# Patient Record
Sex: Male | Born: 1935 | Race: White | Hispanic: No | Marital: Married | Smoking: Former smoker
Health system: Southern US, Community
[De-identification: ages and names within clinical notes are randomized; demographics above are authoritative.]

## PROBLEM LIST (undated history)

## (undated) DIAGNOSIS — J189 Pneumonia, unspecified organism: Secondary | ICD-10-CM

## (undated) DIAGNOSIS — Z7901 Long term (current) use of anticoagulants: Secondary | ICD-10-CM

## (undated) DIAGNOSIS — F419 Anxiety disorder, unspecified: Secondary | ICD-10-CM

## (undated) DIAGNOSIS — I639 Cerebral infarction, unspecified: Secondary | ICD-10-CM

## (undated) DIAGNOSIS — I714 Abdominal aortic aneurysm, without rupture, unspecified: Secondary | ICD-10-CM

## (undated) DIAGNOSIS — I219 Acute myocardial infarction, unspecified: Secondary | ICD-10-CM

## (undated) DIAGNOSIS — C61 Malignant neoplasm of prostate: Secondary | ICD-10-CM

## (undated) DIAGNOSIS — F32A Depression, unspecified: Secondary | ICD-10-CM

## (undated) DIAGNOSIS — M1990 Unspecified osteoarthritis, unspecified site: Secondary | ICD-10-CM

## (undated) DIAGNOSIS — K219 Gastro-esophageal reflux disease without esophagitis: Secondary | ICD-10-CM

## (undated) DIAGNOSIS — I4891 Unspecified atrial fibrillation: Secondary | ICD-10-CM

## (undated) DIAGNOSIS — R51 Headache: Secondary | ICD-10-CM

## (undated) DIAGNOSIS — G8929 Other chronic pain: Secondary | ICD-10-CM

## (undated) DIAGNOSIS — H9190 Unspecified hearing loss, unspecified ear: Secondary | ICD-10-CM

## (undated) DIAGNOSIS — I10 Essential (primary) hypertension: Secondary | ICD-10-CM

## (undated) DIAGNOSIS — Z436 Encounter for attention to other artificial openings of urinary tract: Secondary | ICD-10-CM

## (undated) DIAGNOSIS — I350 Nonrheumatic aortic (valve) stenosis: Secondary | ICD-10-CM

## (undated) DIAGNOSIS — J449 Chronic obstructive pulmonary disease, unspecified: Secondary | ICD-10-CM

## (undated) DIAGNOSIS — C4491 Basal cell carcinoma of skin, unspecified: Secondary | ICD-10-CM

## (undated) DIAGNOSIS — Z950 Presence of cardiac pacemaker: Secondary | ICD-10-CM

## (undated) DIAGNOSIS — R0602 Shortness of breath: Secondary | ICD-10-CM

## (undated) DIAGNOSIS — I951 Orthostatic hypotension: Secondary | ICD-10-CM

## (undated) DIAGNOSIS — R55 Syncope and collapse: Secondary | ICD-10-CM

## (undated) DIAGNOSIS — I2510 Atherosclerotic heart disease of native coronary artery without angina pectoris: Secondary | ICD-10-CM

## (undated) DIAGNOSIS — C679 Malignant neoplasm of bladder, unspecified: Secondary | ICD-10-CM

## (undated) DIAGNOSIS — R001 Bradycardia, unspecified: Secondary | ICD-10-CM

## (undated) DIAGNOSIS — M5416 Radiculopathy, lumbar region: Secondary | ICD-10-CM

## (undated) DIAGNOSIS — F329 Major depressive disorder, single episode, unspecified: Secondary | ICD-10-CM

## (undated) DIAGNOSIS — Z9889 Other specified postprocedural states: Secondary | ICD-10-CM

## (undated) DIAGNOSIS — E785 Hyperlipidemia, unspecified: Secondary | ICD-10-CM

## (undated) HISTORY — PX: SKIN CANCER EXCISION: SHX779

## (undated) HISTORY — DX: Abdominal aortic aneurysm, without rupture: I71.4

## (undated) HISTORY — PX: BLADDER SURGERY: SHX569

## (undated) HISTORY — DX: Orthostatic hypotension: I95.1

## (undated) HISTORY — PX: INSERT / REPLACE / REMOVE PACEMAKER: SUR710

## (undated) HISTORY — DX: Hyperlipidemia, unspecified: E78.5

## (undated) HISTORY — DX: Bradycardia, unspecified: R00.1

## (undated) HISTORY — PX: REVISION UROSTOMY CUTANEOUS: SUR1282

## (undated) HISTORY — DX: Chronic obstructive pulmonary disease, unspecified: J44.9

## (undated) HISTORY — DX: Long term (current) use of anticoagulants: Z79.01

## (undated) HISTORY — DX: Abdominal aortic aneurysm, without rupture, unspecified: I71.40

## (undated) HISTORY — DX: Atherosclerotic heart disease of native coronary artery without angina pectoris: I25.10

## (undated) HISTORY — DX: Acute myocardial infarction, unspecified: I21.9

## (undated) HISTORY — DX: Major depressive disorder, single episode, unspecified: F32.9

## (undated) HISTORY — PX: CATARACT EXTRACTION W/ INTRAOCULAR LENS  IMPLANT, BILATERAL: SHX1307

## (undated) HISTORY — DX: Depression, unspecified: F32.A

## (undated) HISTORY — PX: CHOLECYSTECTOMY: SHX55

## (undated) HISTORY — PX: APPENDECTOMY: SHX54

## (undated) HISTORY — DX: Nonrheumatic aortic (valve) stenosis: I35.0

## (undated) HISTORY — PX: PROSTATECTOMY: SHX69

## (undated) HISTORY — DX: Syncope and collapse: R55

---

## 1997-06-04 HISTORY — PX: ABDOMINAL AORTIC ANEURYSM REPAIR: SUR1152

## 1997-06-04 HISTORY — PX: CORONARY ARTERY BYPASS GRAFT: SHX141

## 2000-09-13 ENCOUNTER — Ambulatory Visit (HOSPITAL_COMMUNITY): Admission: RE | Admit: 2000-09-13 | Discharge: 2000-09-13 | Payer: Self-pay | Admitting: Gastroenterology

## 2000-12-04 ENCOUNTER — Ambulatory Visit (HOSPITAL_COMMUNITY): Admission: RE | Admit: 2000-12-04 | Discharge: 2000-12-04 | Payer: Self-pay | Admitting: Cardiology

## 2000-12-25 ENCOUNTER — Encounter: Payer: Self-pay | Admitting: Cardiothoracic Surgery

## 2000-12-27 ENCOUNTER — Inpatient Hospital Stay (HOSPITAL_COMMUNITY): Admission: RE | Admit: 2000-12-27 | Discharge: 2001-01-02 | Payer: Self-pay | Admitting: Cardiothoracic Surgery

## 2000-12-27 ENCOUNTER — Encounter: Payer: Self-pay | Admitting: Cardiothoracic Surgery

## 2000-12-28 ENCOUNTER — Encounter: Payer: Self-pay | Admitting: Cardiothoracic Surgery

## 2000-12-28 ENCOUNTER — Encounter: Payer: Self-pay | Admitting: Thoracic Surgery (Cardiothoracic Vascular Surgery)

## 2000-12-29 ENCOUNTER — Encounter: Payer: Self-pay | Admitting: Thoracic Surgery (Cardiothoracic Vascular Surgery)

## 2001-05-16 ENCOUNTER — Encounter: Admission: RE | Admit: 2001-05-16 | Discharge: 2001-05-16 | Payer: Self-pay | Admitting: Surgery

## 2001-05-16 ENCOUNTER — Encounter: Payer: Self-pay | Admitting: Surgery

## 2001-05-26 ENCOUNTER — Encounter: Admission: RE | Admit: 2001-05-26 | Discharge: 2001-05-26 | Payer: Self-pay | Admitting: Surgery

## 2001-05-26 ENCOUNTER — Encounter: Payer: Self-pay | Admitting: Surgery

## 2001-05-29 ENCOUNTER — Ambulatory Visit (HOSPITAL_BASED_OUTPATIENT_CLINIC_OR_DEPARTMENT_OTHER): Admission: RE | Admit: 2001-05-29 | Discharge: 2001-05-29 | Payer: Self-pay | Admitting: Surgery

## 2001-05-29 ENCOUNTER — Encounter (INDEPENDENT_AMBULATORY_CARE_PROVIDER_SITE_OTHER): Payer: Self-pay | Admitting: Specialist

## 2001-06-10 ENCOUNTER — Encounter: Payer: Self-pay | Admitting: Internal Medicine

## 2001-06-10 ENCOUNTER — Encounter: Admission: RE | Admit: 2001-06-10 | Discharge: 2001-06-10 | Payer: Self-pay | Admitting: Internal Medicine

## 2001-06-12 ENCOUNTER — Encounter: Admission: RE | Admit: 2001-06-12 | Discharge: 2001-09-10 | Payer: Self-pay

## 2001-09-30 ENCOUNTER — Encounter: Admission: RE | Admit: 2001-09-30 | Discharge: 2001-12-29 | Payer: Self-pay

## 2001-12-15 ENCOUNTER — Encounter (INDEPENDENT_AMBULATORY_CARE_PROVIDER_SITE_OTHER): Payer: Self-pay | Admitting: Specialist

## 2001-12-15 ENCOUNTER — Ambulatory Visit (HOSPITAL_BASED_OUTPATIENT_CLINIC_OR_DEPARTMENT_OTHER): Admission: RE | Admit: 2001-12-15 | Discharge: 2001-12-15 | Payer: Self-pay | Admitting: Surgery

## 2002-01-14 ENCOUNTER — Encounter: Payer: Self-pay | Admitting: Internal Medicine

## 2002-01-14 ENCOUNTER — Encounter: Admission: RE | Admit: 2002-01-14 | Discharge: 2002-01-14 | Payer: Self-pay | Admitting: Internal Medicine

## 2002-12-22 ENCOUNTER — Encounter: Admission: RE | Admit: 2002-12-22 | Discharge: 2002-12-22 | Payer: Self-pay | Admitting: Internal Medicine

## 2002-12-22 ENCOUNTER — Encounter: Payer: Self-pay | Admitting: Internal Medicine

## 2003-07-21 ENCOUNTER — Ambulatory Visit (HOSPITAL_COMMUNITY): Admission: RE | Admit: 2003-07-21 | Discharge: 2003-07-21 | Payer: Self-pay | Admitting: Urology

## 2003-07-21 ENCOUNTER — Encounter (INDEPENDENT_AMBULATORY_CARE_PROVIDER_SITE_OTHER): Payer: Self-pay | Admitting: *Deleted

## 2003-07-21 ENCOUNTER — Encounter (INDEPENDENT_AMBULATORY_CARE_PROVIDER_SITE_OTHER): Payer: Self-pay | Admitting: Specialist

## 2004-02-27 ENCOUNTER — Ambulatory Visit (HOSPITAL_COMMUNITY): Admission: RE | Admit: 2004-02-27 | Discharge: 2004-02-27 | Payer: Self-pay | Admitting: Internal Medicine

## 2004-06-04 HISTORY — PX: REVISION UROSTOMY CUTANEOUS: SUR1282

## 2005-01-30 ENCOUNTER — Encounter: Admission: RE | Admit: 2005-01-30 | Discharge: 2005-01-30 | Payer: Self-pay | Admitting: Urology

## 2005-02-07 ENCOUNTER — Encounter (INDEPENDENT_AMBULATORY_CARE_PROVIDER_SITE_OTHER): Payer: Self-pay | Admitting: Specialist

## 2005-02-07 ENCOUNTER — Ambulatory Visit (HOSPITAL_BASED_OUTPATIENT_CLINIC_OR_DEPARTMENT_OTHER): Admission: RE | Admit: 2005-02-07 | Discharge: 2005-02-07 | Payer: Self-pay | Admitting: Urology

## 2005-02-07 ENCOUNTER — Ambulatory Visit (HOSPITAL_COMMUNITY): Admission: RE | Admit: 2005-02-07 | Discharge: 2005-02-07 | Payer: Self-pay | Admitting: Urology

## 2005-03-05 ENCOUNTER — Encounter (INDEPENDENT_AMBULATORY_CARE_PROVIDER_SITE_OTHER): Payer: Self-pay | Admitting: Specialist

## 2005-03-05 ENCOUNTER — Ambulatory Visit (HOSPITAL_COMMUNITY): Admission: RE | Admit: 2005-03-05 | Discharge: 2005-03-06 | Payer: Self-pay | Admitting: Urology

## 2005-04-25 ENCOUNTER — Inpatient Hospital Stay (HOSPITAL_COMMUNITY): Admission: RE | Admit: 2005-04-25 | Discharge: 2005-05-02 | Payer: Self-pay | Admitting: Urology

## 2005-04-25 ENCOUNTER — Encounter (INDEPENDENT_AMBULATORY_CARE_PROVIDER_SITE_OTHER): Payer: Self-pay | Admitting: *Deleted

## 2005-05-23 ENCOUNTER — Encounter: Admission: RE | Admit: 2005-05-23 | Discharge: 2005-05-23 | Payer: Self-pay | Admitting: Gastroenterology

## 2005-06-19 ENCOUNTER — Encounter: Admission: RE | Admit: 2005-06-19 | Discharge: 2005-06-19 | Payer: Self-pay | Admitting: General Surgery

## 2005-10-26 ENCOUNTER — Ambulatory Visit (HOSPITAL_COMMUNITY): Admission: RE | Admit: 2005-10-26 | Discharge: 2005-10-26 | Payer: Self-pay | Admitting: Cardiology

## 2005-10-30 ENCOUNTER — Ambulatory Visit (HOSPITAL_COMMUNITY): Admission: RE | Admit: 2005-10-30 | Discharge: 2005-10-30 | Payer: Self-pay | Admitting: Urology

## 2006-12-15 ENCOUNTER — Inpatient Hospital Stay (HOSPITAL_COMMUNITY): Admission: EM | Admit: 2006-12-15 | Discharge: 2006-12-17 | Payer: Self-pay | Admitting: *Deleted

## 2006-12-16 ENCOUNTER — Ambulatory Visit: Payer: Self-pay | Admitting: Vascular Surgery

## 2006-12-16 ENCOUNTER — Encounter: Payer: Self-pay | Admitting: Cardiology

## 2007-04-05 ENCOUNTER — Emergency Department (HOSPITAL_COMMUNITY): Admission: EM | Admit: 2007-04-05 | Discharge: 2007-04-05 | Payer: Self-pay | Admitting: Emergency Medicine

## 2007-04-17 ENCOUNTER — Inpatient Hospital Stay (HOSPITAL_COMMUNITY): Admission: EM | Admit: 2007-04-17 | Discharge: 2007-04-23 | Payer: Self-pay | Admitting: *Deleted

## 2007-04-18 ENCOUNTER — Ambulatory Visit: Payer: Self-pay | Admitting: Surgery

## 2007-04-18 ENCOUNTER — Encounter (INDEPENDENT_AMBULATORY_CARE_PROVIDER_SITE_OTHER): Payer: Self-pay | Admitting: Internal Medicine

## 2007-04-18 ENCOUNTER — Encounter (INDEPENDENT_AMBULATORY_CARE_PROVIDER_SITE_OTHER): Payer: Self-pay | Admitting: *Deleted

## 2007-04-22 ENCOUNTER — Encounter: Payer: Self-pay | Admitting: Surgery

## 2007-05-05 ENCOUNTER — Ambulatory Visit: Payer: Self-pay | Admitting: Surgery

## 2007-06-02 ENCOUNTER — Ambulatory Visit: Payer: Self-pay | Admitting: Surgery

## 2007-06-16 ENCOUNTER — Ambulatory Visit: Payer: Self-pay | Admitting: Surgery

## 2007-10-20 ENCOUNTER — Ambulatory Visit: Payer: Self-pay | Admitting: Surgery

## 2007-11-10 ENCOUNTER — Encounter: Admission: RE | Admit: 2007-11-10 | Discharge: 2007-11-10 | Payer: Self-pay | Admitting: Gastroenterology

## 2007-11-13 ENCOUNTER — Encounter: Admission: RE | Admit: 2007-11-13 | Discharge: 2007-11-13 | Payer: Self-pay | Admitting: Gastroenterology

## 2008-02-02 ENCOUNTER — Inpatient Hospital Stay (HOSPITAL_COMMUNITY): Admission: RE | Admit: 2008-02-02 | Discharge: 2008-02-06 | Payer: Self-pay | Admitting: General Surgery

## 2008-02-02 ENCOUNTER — Encounter (INDEPENDENT_AMBULATORY_CARE_PROVIDER_SITE_OTHER): Payer: Self-pay | Admitting: General Surgery

## 2008-03-02 ENCOUNTER — Encounter: Admission: RE | Admit: 2008-03-02 | Discharge: 2008-03-02 | Payer: Self-pay | Admitting: General Surgery

## 2008-03-06 ENCOUNTER — Emergency Department (HOSPITAL_BASED_OUTPATIENT_CLINIC_OR_DEPARTMENT_OTHER): Admission: EM | Admit: 2008-03-06 | Discharge: 2008-03-06 | Payer: Self-pay | Admitting: Emergency Medicine

## 2008-04-19 ENCOUNTER — Ambulatory Visit: Payer: Self-pay | Admitting: Surgery

## 2008-05-14 ENCOUNTER — Encounter: Admission: RE | Admit: 2008-05-14 | Discharge: 2008-05-14 | Payer: Self-pay | Admitting: Geriatric Medicine

## 2008-06-01 ENCOUNTER — Encounter: Admission: RE | Admit: 2008-06-01 | Discharge: 2008-06-01 | Payer: Self-pay | Admitting: Geriatric Medicine

## 2008-06-15 ENCOUNTER — Encounter: Admission: RE | Admit: 2008-06-15 | Discharge: 2008-06-15 | Payer: Self-pay | Admitting: Geriatric Medicine

## 2009-04-05 ENCOUNTER — Encounter: Admission: RE | Admit: 2009-04-05 | Discharge: 2009-04-05 | Payer: Self-pay | Admitting: Geriatric Medicine

## 2009-04-11 ENCOUNTER — Ambulatory Visit: Payer: Self-pay | Admitting: Surgery

## 2009-08-24 ENCOUNTER — Ambulatory Visit: Payer: Self-pay | Admitting: Diagnostic Radiology

## 2009-08-24 ENCOUNTER — Emergency Department (HOSPITAL_BASED_OUTPATIENT_CLINIC_OR_DEPARTMENT_OTHER)
Admission: EM | Admit: 2009-08-24 | Discharge: 2009-08-24 | Payer: Self-pay | Source: Home / Self Care | Admitting: Emergency Medicine

## 2009-11-05 ENCOUNTER — Emergency Department (HOSPITAL_BASED_OUTPATIENT_CLINIC_OR_DEPARTMENT_OTHER): Admission: EM | Admit: 2009-11-05 | Discharge: 2009-11-05 | Payer: Self-pay | Admitting: Emergency Medicine

## 2009-11-11 ENCOUNTER — Ambulatory Visit: Payer: Self-pay | Admitting: Cardiology

## 2009-11-11 ENCOUNTER — Encounter: Payer: Self-pay | Admitting: Emergency Medicine

## 2009-11-11 ENCOUNTER — Inpatient Hospital Stay (HOSPITAL_COMMUNITY): Admission: AD | Admit: 2009-11-11 | Discharge: 2009-11-16 | Payer: Self-pay | Admitting: Internal Medicine

## 2009-11-11 ENCOUNTER — Ambulatory Visit: Payer: Self-pay | Admitting: Diagnostic Radiology

## 2009-11-12 ENCOUNTER — Encounter (INDEPENDENT_AMBULATORY_CARE_PROVIDER_SITE_OTHER): Payer: Self-pay | Admitting: Internal Medicine

## 2009-11-15 ENCOUNTER — Ambulatory Visit: Payer: Self-pay | Admitting: Cardiology

## 2009-11-16 ENCOUNTER — Encounter: Payer: Self-pay | Admitting: Cardiology

## 2009-11-17 ENCOUNTER — Telehealth: Payer: Self-pay | Admitting: Cardiology

## 2009-11-18 ENCOUNTER — Ambulatory Visit: Payer: Self-pay

## 2009-11-18 ENCOUNTER — Ambulatory Visit: Payer: Self-pay | Admitting: Internal Medicine

## 2009-11-18 ENCOUNTER — Ambulatory Visit: Payer: Self-pay | Admitting: Cardiology

## 2009-11-18 DIAGNOSIS — I2510 Atherosclerotic heart disease of native coronary artery without angina pectoris: Secondary | ICD-10-CM

## 2009-11-18 DIAGNOSIS — E785 Hyperlipidemia, unspecified: Secondary | ICD-10-CM

## 2009-11-18 DIAGNOSIS — R079 Chest pain, unspecified: Secondary | ICD-10-CM

## 2009-11-18 DIAGNOSIS — Z8679 Personal history of other diseases of the circulatory system: Secondary | ICD-10-CM | POA: Insufficient documentation

## 2009-11-18 DIAGNOSIS — R55 Syncope and collapse: Secondary | ICD-10-CM

## 2009-11-18 DIAGNOSIS — Z950 Presence of cardiac pacemaker: Secondary | ICD-10-CM

## 2009-11-18 DIAGNOSIS — I951 Orthostatic hypotension: Secondary | ICD-10-CM

## 2009-11-18 DIAGNOSIS — I498 Other specified cardiac arrhythmias: Secondary | ICD-10-CM

## 2009-11-19 ENCOUNTER — Ambulatory Visit: Payer: Self-pay | Admitting: Cardiology

## 2009-11-19 ENCOUNTER — Inpatient Hospital Stay (HOSPITAL_COMMUNITY): Admission: EM | Admit: 2009-11-19 | Discharge: 2009-11-24 | Payer: Self-pay | Admitting: Cardiology

## 2009-11-20 ENCOUNTER — Encounter: Payer: Self-pay | Admitting: Cardiology

## 2009-11-21 ENCOUNTER — Encounter: Payer: Self-pay | Admitting: Cardiology

## 2009-11-22 ENCOUNTER — Encounter: Payer: Self-pay | Admitting: Internal Medicine

## 2009-11-24 ENCOUNTER — Encounter: Payer: Self-pay | Admitting: Cardiology

## 2009-12-16 ENCOUNTER — Encounter: Admission: RE | Admit: 2009-12-16 | Discharge: 2009-12-16 | Payer: Self-pay | Admitting: Orthopedic Surgery

## 2009-12-19 ENCOUNTER — Encounter: Payer: Self-pay | Admitting: Internal Medicine

## 2009-12-19 ENCOUNTER — Ambulatory Visit: Payer: Self-pay

## 2010-01-04 ENCOUNTER — Ambulatory Visit: Payer: Self-pay | Admitting: Cardiology

## 2010-01-26 ENCOUNTER — Ambulatory Visit: Payer: Self-pay | Admitting: Cardiology

## 2010-02-28 ENCOUNTER — Ambulatory Visit: Payer: Self-pay | Admitting: Internal Medicine

## 2010-02-28 ENCOUNTER — Encounter: Payer: Self-pay | Admitting: Cardiology

## 2010-03-02 ENCOUNTER — Ambulatory Visit: Payer: Self-pay | Admitting: Diagnostic Radiology

## 2010-03-02 ENCOUNTER — Emergency Department (HOSPITAL_BASED_OUTPATIENT_CLINIC_OR_DEPARTMENT_OTHER): Admission: EM | Admit: 2010-03-02 | Discharge: 2010-03-02 | Payer: Self-pay | Admitting: Emergency Medicine

## 2010-03-22 ENCOUNTER — Ambulatory Visit: Payer: Self-pay | Admitting: Cardiology

## 2010-06-16 ENCOUNTER — Ambulatory Visit: Payer: Self-pay | Admitting: Cardiology

## 2010-06-20 ENCOUNTER — Telehealth: Payer: Self-pay | Admitting: Internal Medicine

## 2010-07-04 NOTE — Cardiovascular Report (Signed)
Summary: Office Visit   Office Visit   Imported By: Roderic Ovens 03/01/2010 15:46:19  _____________________________________________________________________  External Attachment:    Type:   Image     Comment:   External Document

## 2010-07-04 NOTE — Progress Notes (Signed)
Summary: PACEMAKER SITE SORE  Phone Note Call from Patient Call back at Home Phone [phone]   Caller: Patient [phone] Reason for Call: Talk to Nurse Summary of Call: PT'S PACEMAKER SITE IS SWELLING A LITTLE TODAY AND HAVING SOME SORENESS Initial call taken by: Glynda Jaeger,  November 17, 2009 3:56 PM  Follow-up for Phone Call        Dr Juanda Chance told him if he noticed any swelling or inflammation to let us know.  this afternoon -  puffy and more tender, no warmth to touch. Pt has not taken his temp.  Unable to tell if any change in skin color due to betadine.  He is concerned because this is a change since placement on Tuesday.  Will schedule pt to come in to check site. Follow-up by: Charolotte Capuchin, RN,  November 17, 2009 4:11 PM

## 2010-07-04 NOTE — Assessment & Plan Note (Signed)
Summary: pc2/jml   Visit Type:  Follow-up Primary Provider:  Marlana Latus, MD   History of Present Illness: Peter Richards returns today for followup.  He is a pleasant 77 yo man with a h/o PAF, symptomatic tachybrady, s/p PPM.  His initial implant was complicated by the development of microscopic perforation.  He has otherwise been stable except for the development of severe back pain which has been treated with a combination of massage and physical therapy.  Current Medications (verified): 1)  Metoprolol Succinate 25 Mg Xr24h-Tab (Metoprolol Succinate) .... Take 1 Tablet By Mouth Once Daily 2)  Polyethylene Glycol 3350  Powd (Polyethylene Glycol 3350) .... Uad 3)  Warfarin Sodium 5 Mg Tabs (Warfarin Sodium) .... Use As Directed By Anticoagulation Clinic 4)  Ambien 10 Mg Tabs (Zolpidem Tartrate) .... Take One Tablet By Mouth Once Daily. 5)  Amiodarone Hcl 200 Mg Tabs (Amiodarone Hcl) .... Take 1/2 Tablet By Mouth Once Daily 6)  Crestor 5 Mg Tabs (Rosuvastatin Calcium) .... Take One Tablet By Mouth Daily. 7)  Norco 5-325 Mg Tabs (Hydrocodone-Acetaminophen) .... As Needed 8)  Vitamin D3 400 Unit Tabs (Cholecalciferol) .... Once Daily  Allergies: 1)  ! Codeine  Past History:  Past Medical History: Last updated: 11/18/2009 Current Problems:  CORONARY ARTERY DISEASE (ICD-414.00) HYPERLIPIDEMIA (ICD-272.4) ABDOMINAL AORTIC ANEURYSM, HX OF (ICD-V12.50) ATRIAL FIBRILLATION, HX OF (ICD-V12.59) HYPOTENSION, ORTHOSTATIC (ICD-458.0) PACEMAKER, PERMANENT (ICD-V45.01) BRADYCARDIA (ICD-427.89) SYNCOPE (ICD-780.2)    Review of Systems  The patient denies chest pain, syncope, and dyspnea on exertion.    Vital Signs:  Patient profile:   77 year old male Height:      69 inches Weight:      185 pounds BMI:     27.42 Pulse rate:   64 / minute BP sitting:   100 / 60  (left arm)  Vitals Entered By: Laurance Flatten CMA (February 28, 2010 2:41 PM)  Physical Exam  General:  The patient  was alert and oriented in no acute distress. HEENT Normal.  Neck veins were flat, carotids were brisk.  Pacemaker pocket is well healed. Lungs were clear.  Heart sounds were regular without murmurs or gallops.  Abdomen was soft with active bowel sounds.a colostomy bag was in place There is no clubbing cyanosis or edema. Skin Warm and dry    PPM Specifications Following MD:  Everardo Beals. Juanda Chance, MD     PPM Vendor:  Medtronic     PPM Model Number:  [id number]     PPM Serial Number:  [id number] [facility] PPM DOI:  11/15/2009     PPM Implanting MD:  Everardo Beals. Juanda Chance, MD  Lead 1    Location: RA     DOI: 11/15/2009     Model #: [id number]     Serial #: [id number]     Status: active Lead 2    Location: RV     DOI: 11/15/2009     Model #: [id number]     Serial #: [id number]     Status: active  Magnet Response Rate:  BOL 85 ERI 65  Indications:  Brady  Explantation Comments:  11/21/09 Atrial and ventricular lead revision.  PPM Follow Up Battery Voltage:  2.79 V     Battery Est. Longevity:  11.5 yrs       PPM Device Measurements Atrium  Amplitude: 4.00 mV, Impedance: 513 ohms, Threshold: 0.50 V at 0.40 msec Right Ventricle  Amplitude: 22.40 mV, Impedance: 592 ohms, Threshold: 1.00 V at 0.40 msec  Episodes MS Episodes:  0     Ventricular High Rate:  0     Atrial Pacing:  71.6%     Ventricular Pacing:  <0.2%  Parameters Mode:  MVP     Lower Rate Limit:  60     Upper Rate Limit:  130 Paced AV Delay:  150     Sensed AV Delay:  120 Next Cardiology Appt Due:  11/06/2010 Tech Comments:  NORMAL DEVICE FUNCTION.  NO EPISODES OR MODE SWITCHES. + COUMADIN.  CHANGED RA OUTPUT FROM 3.5 TO 2.00 AND RV OUTPUT FROM 3.5 TO 2.5 V.  ROV IN JUNE 2012 W/GT.  SHOWED PT HOW CARELINK TRANSMITTER WORKED. Vella Kohler  February 28, 2010 2:56 PM MD Comments:  agree with above.  Impression & Recommendations:  Problem # 1:  ATRIAL FIBRILLATION, HX OF (ICD-V12.59)  He has been in NSR on low dose amio. His updated medication list for  this problem includes:    Metoprolol Succinate 25 Mg Xr24h-tab (Metoprolol succinate) .[name]... Take 1 tablet by mouth once daily    Warfarin Sodium 5 Mg Tabs (Warfarin sodium) ..... Use as directed by anticoagulation clinic    Amiodarone Hcl 200 Mg Tabs (Amiodarone hcl) .[name]... Take 1/2 tablet by mouth once daily  His updated medication list for this problem includes:    Metoprolol Succinate 25 Mg Xr24h-tab (Metoprolol succinate) .[name]... Take 1 tablet by mouth once daily    Warfarin Sodium 5 Mg Tabs (Warfarin sodium) ..... Use as directed by anticoagulation clinic    Amiodarone Hcl 200 Mg Tabs (Amiodarone hcl) .[name]... Take 1/2 tablet by mouth once daily  Problem # 2:  PACEMAKER, PERMANENT (ICD-V45.01) His device is working normally.  Will recheck in several months.  Patient Instructions: 1)  Your physician wants you to follow-up in:  June of 2012 with  Dr Court Joy will receive a reminder letter in the mail two months in advance. If you don't receive a letter, please call our office to schedule the follow-up appointment.

## 2010-07-04 NOTE — Assessment & Plan Note (Signed)
Summary: s/p pacer placement 6/14   History of Present Illness: Mr. Debes is seen following the pacemaker implantation by Dr. Juanda Chance 72 hours ago undertaken for syncope with documented bradycardia in the setting of paroxysmal atrial fibrillation.  His last INR was 1.9 when hospital.    He has had since yesterday a complaint of infrascapular discomfort associated with breathing that is quite striking. He denies associated dyspnea. There has been no precordial chest pain. There has been no associated edema of his lower extremities.  Current Medications (verified): 1)  Metoprolol Succinate 25 Mg Xr24h-Tab (Metoprolol Succinate) .... Take 1/2  Tablet By Mouth Daily 2)  Polyethylene Glycol 3350  Powd (Polyethylene Glycol 3350) .... Uad 3)  Warfarin Sodium 2.5 Mg Tabs (Warfarin Sodium) .... Use As Directed By Anticoagualtion Clinic 4)  Ambien 10 Mg Tabs (Zolpidem Tartrate) .... Take One Tablet By Mouth Once Daily. 5)  Amiodarone Hcl 200 Mg Tabs (Amiodarone Hcl) .... Take 1/2 Tablet By Mouth Twice A Day 6)  Crestor 5 Mg Tabs (Rosuvastatin Calcium) .... Take One Tablet By Mouth Daily. 7)  Norco 5-325 Mg Tabs (Hydrocodone-Acetaminophen) .... As Needed 8)  Vitamin D3 400 Unit Tabs (Cholecalciferol) .... Once Daily  Allergies (verified): 1)  ! Hydrocodone  Past History:  Past Medical History: Last updated: 11/18/2009 Current Problems:  CORONARY ARTERY DISEASE (ICD-414.00) HYPERLIPIDEMIA (ICD-272.4) ABDOMINAL AORTIC ANEURYSM, HX OF (ICD-V12.50) ATRIAL FIBRILLATION, HX OF (ICD-V12.59) HYPOTENSION, ORTHOSTATIC (ICD-458.0) PACEMAKER, PERMANENT (ICD-V45.01) BRADYCARDIA (ICD-427.89) SYNCOPE (ICD-780.2)    Vital Signs:  Patient profile:   77 year old male Height:      69 inches Weight:      186 pounds BMI:     27.57 Pulse rate:   72 / minute BP sitting:   130 / 80  (left arm)  Vitals Entered By: Laurance Flatten CMA (November 18, 2009 3:07 PM)  Physical Exam  General:  The patient was  alert and oriented in no acute distress. HEENT Normal.  Neck veins were flat, carotids were brisk.  Pacemaker pocket had a mild hematoma Lungs were clear.  Heart sounds were regular without murmurs or gallops.  Abdomen was soft with active bowel sounds.a colostomy bag was in place There is no clubbing cyanosis or edema. Skin Warm and dry    Impression & Recommendations:  Problem # 1:  CHEST PAIN (ICD-786.50) The patient has post pacemaker implantation pleuritic chest pain. I suspect that this relates to perforation and irritation of the pericardium. CT Scan again a chest x-ray will and device interrogation were all undertaken today demonstrating no evidence of ulnar embolism, pericardial effusion, abnormal impedances.   Notwithstanding the clinical impression is still strong. I reviewed this with Dr. Juanda Chance. My recommendation would be to move the leads. Dr. Regino Schultze thoughts were to watch over the weekend. The patient would prefer to wait. We have given her a prescription for pain medication. I've advised him to hold his Coumadin. He is further defined to go to the hospital in the event that he develops shortness of breath. I have alerted Dr. Yevonne Pax service.  He tentatively has a slot on Monday for lead repositioning. His updated medication list for this problem includes:    Metoprolol Succinate 25 Mg Xr24h-tab (Metoprolol succinate) .[name]... Take 1/2  tablet by mouth daily    Warfarin Sodium 2.5 Mg Tabs (Warfarin sodium) ..... Use as directed by anticoagualtion clinic  Other Orders: T-2 View CXR (71020TC) CT Scan  (CT Scan)  Patient Instructions: 1)  A chest x-ray takes  a picture of the organs and structures inside the chest, including the heart, lungs, and blood vessels. This test can show several things, including, whether the heart is enlarged; whether fluid is building up in the lungs; and whether pacemaker / defibrillator leads are still in place. 2)  Non-Cardiac CT scanning, (CAT  scanning), is a noninvasive, special x-ray that produces cross-sectional images of the body using x-rays and a computer. CT scans help physicians diagnose and treat medical conditions. For some CT exams, a contrast material is used to enhance visibility in the area of the body being studied. CT scans provide greater clarity and reveal more details than regular x-ray exams.

## 2010-07-04 NOTE — Cardiovascular Report (Signed)
Summary: Patient Implant Record Information   Patient Implant Record Information   Imported By: Roderic Ovens 01/03/2010 15:34:03  _____________________________________________________________________  External Attachment:    Type:   Image     Comment:   External Document

## 2010-07-04 NOTE — Cardiovascular Report (Signed)
Summary: Office Visit   Office Visit   Imported By: Roderic Ovens 01/03/2010 15:32:12  _____________________________________________________________________  External Attachment:    Type:   Image     Comment:   External Document

## 2010-07-04 NOTE — Miscellaneous (Signed)
Summary: Device preload  Clinical Lists Changes  Observations: Added new observation of PPMEXPLCOMM: 11/21/09 Atrial and ventricular lead revision. (11/24/2009 10:59) Added new observation of PPM INDICATN: Brady (11/24/2009 10:59) Added new observation of MAGNET RTE: BOL 85 ERI 65 (11/24/2009 10:59) Added new observation of PPMLEADSTAT2: active (11/24/2009 10:59) Added new observation of PPMLEADSER2: [id number] (11/24/2009 10:59) Added new observation of PPMLEADMOD2: 5076  (11/24/2009 10:59) Added new observation of PPMLEADLOC2: RV  (11/24/2009 10:59) Added new observation of PPMLEADSTAT1: active  (11/24/2009 10:59) Added new observation of PPMLEADSER1: [id number]  (11/24/2009 10:59) Added new observation of PPMLEADMOD1: 5076  (11/24/2009 10:59) Added new observation of PPMLEADLOC1: RA  (11/24/2009 10:59) Added new observation of PPM IMP MD: Everardo Beals. Juanda Chance, MD  (11/24/2009 10:59) Added new observation of PPMLEADDOI2: 11/15/2009  (11/24/2009 10:59) Added new observation of PPMLEADDOI1: 11/15/2009  (11/24/2009 10:59) Added new observation of PPM DOI: 11/15/2009  (11/24/2009 10:59) Added new observation of PPM SERL#: [id number]  (11/24/2009 10:59) Added new observation of PPM MODL#: [id number]  (11/24/2009 29:52) Added new observation of PACEMAKERMFG: Medtronic  (11/24/2009 10:59) Added new observation of PACEMAKER MD: Everardo Beals. Juanda Chance, MD  (11/24/2009 10:59)      PPM Specifications Following MD:  Everardo Beals. Juanda Chance, MD     PPM Vendor:  Medtronic     PPM Model Number:  ADDRL1     PPM Serial Number:  [id number] [facility] PPM DOI:  11/15/2009     PPM Implanting MD:  Everardo Beals. Juanda Chance, MD  Lead 1    Location: RA     DOI: 11/15/2009     Model #: [id number]     Serial #: [id number]     Status: active Lead 2    Location: RV     DOI: 11/15/2009     Model #: [id number]     Serial #: [id number]     Status: active  Magnet Response Rate:  BOL 85 ERI 65  Indications:  Brady  Explantation Comments:  11/21/09 Atrial and  ventricular lead revision.

## 2010-07-04 NOTE — Procedures (Signed)
Summary: wound chekc   Current Medications (verified): 1)  Metoprolol Succinate 25 Mg Xr24h-Tab (Metoprolol Succinate) .... Take 1 Tablet By Mouth Once Daily 2)  Polyethylene Glycol 3350  Powd (Polyethylene Glycol 3350) .... Uad 3)  Warfarin Sodium 2.5 Mg Tabs (Warfarin Sodium) .... Use As Directed By Anticoagualtion Clinic 4)  Ambien 10 Mg Tabs (Zolpidem Tartrate) .... Take One Tablet By Mouth Once Daily. 5)  Amiodarone Hcl 200 Mg Tabs (Amiodarone Hcl) .... Take 1/2 Tablet By Mouth Twice A Day 6)  Crestor 5 Mg Tabs (Rosuvastatin Calcium) .... Take One Tablet By Mouth Daily. 7)  Norco 5-325 Mg Tabs (Hydrocodone-Acetaminophen) .... As Needed 8)  Vitamin D3 400 Unit Tabs (Cholecalciferol) .... Once Daily  Allergies: 1)  ! Codeine  PPM Specifications Following MD:  Everardo Beals. Juanda Chance, MD     PPM Vendor:  [employer]tronic     PPM Model Number:  ADDRL1     PPM Serial Number:  [id number] [facility] PPM DOI:  11/15/2009     PPM Implanting MD:  Everardo Beals. Juanda Chance, MD  Lead 1    Location: RA     DOI: 11/15/2009     Model #: 4098     Serial #: [id number]     Status: active Lead 2    Location: RV     DOI: 11/15/2009     Model #: 6213     Serial #: [id number]     Status: active  Magnet Response Rate:  BOL 85 ERI 65  Indications:  Brady  Explantation Comments:  11/21/09 Atrial and ventricular lead revision.  PPM Follow Up Battery Voltage:  2.79 V     Battery Est. Longevity:  69yrs       PPM Device Measurements Atrium  Amplitude: 4.00 mV, Impedance: 529 ohms, Threshold: 0.750 V at 0.40 msec Right Ventricle  Amplitude: 15.68 mV, Impedance: 617 ohms, Threshold: 1.00 V at 0.40 msec  Episodes MS Episodes:  0     Ventricular High Rate:  0     Atrial Pacing:  42.2%     Ventricular Pacing:  0.3%  Parameters Mode:  MVP     Lower Rate Limit:  60     Upper Rate Limit:  130 Paced AV Delay:  150     Sensed AV Delay:  120 Tech Comments:  WOUND CHECK--STERI STRIPS REMOVED.  NO REDNESS OR SWELLING.  PT IS HAVING RT SHOULDER  BLADE PN SINCE DISCHARGE FROM HOSP.  PT IS SEEING AN ORTHOPEDIC DR FOR THIS.  NORMAL DEVICE FUNCTION.  NO CHANGES MADE.  ROV ON 02-28-10 @ 1450 W/GT.  Vella Kohler  December 19, 2009 2:16 PM   Patient Instructions: 1)  Scheduled to see Dr [name] on 02-28-10 at 250pm.

## 2010-07-06 NOTE — Progress Notes (Addendum)
Summary: QUESTION ON DEVICE  Phone Note Call from Patient Call back at Home Phone [phone]   Caller: Patient Reason for Call: Talk to Nurse Summary of Call: PT HAS QUESTION RE [facility] THE PHONE Initial call taken by: Roe Coombs,  June 20, 2010 12:31 PM  Follow-up for Phone Call        spoke to pt and explained didnt have ov until june.  no transmission scheduled until after june ov. pt aware that letter will be sent couple months in advance to schedule ov w/gt. Vella Kohler  June 20, 2010 4:44 PM

## 2010-08-16 ENCOUNTER — Other Ambulatory Visit: Payer: Self-pay | Admitting: Geriatric Medicine

## 2010-08-16 DIAGNOSIS — M545 Low back pain: Secondary | ICD-10-CM

## 2010-08-17 LAB — DIFFERENTIAL
Lymphocytes Relative: 27 % (ref 12–46)
Lymphs Abs: 2.3 10*3/uL (ref 0.7–4.0)
Monocytes Relative: 18 % — ABNORMAL HIGH (ref 3–12)
Neutro Abs: 4.2 10*3/uL (ref 1.7–7.7)
Neutrophils Relative %: 51 % (ref 43–77)

## 2010-08-17 LAB — URINALYSIS, ROUTINE W REFLEX MICROSCOPIC
Glucose, UA: NEGATIVE mg/dL
Hgb urine dipstick: NEGATIVE
Protein, ur: NEGATIVE mg/dL

## 2010-08-17 LAB — CULTURE, BLOOD (ROUTINE X 2)
Culture  Setup Time: 201109292132
Culture  Setup Time: 201109292132

## 2010-08-17 LAB — URINE MICROSCOPIC-ADD ON

## 2010-08-17 LAB — URINE CULTURE
Colony Count: >=100000
Culture  Setup Time: 201109292205

## 2010-08-17 LAB — COMPREHENSIVE METABOLIC PANEL
BUN: 16 mg/dL (ref 6–23)
Calcium: 9.3 mg/dL (ref 8.4–10.5)
Glucose, Bld: 83 mg/dL (ref 70–99)
Total Protein: 7.4 g/dL (ref 6.0–8.3)

## 2010-08-17 LAB — CBC
Hemoglobin: 13 g/dL (ref 13.0–17.0)
RDW: 13.7 % (ref 11.5–15.5)

## 2010-08-17 LAB — LACTIC ACID, PLASMA: Lactic Acid, Venous: 1.1 mmol/L (ref 0.5–2.2)

## 2010-08-17 LAB — RAPID STREP SCREEN (MED CTR MEBANE ONLY): Streptococcus, Group A Screen (Direct): NEGATIVE

## 2010-08-17 LAB — PROTIME-INR: INR: 2.3 — ABNORMAL HIGH (ref 0.00–1.49)

## 2010-08-18 ENCOUNTER — Ambulatory Visit
Admission: RE | Admit: 2010-08-18 | Discharge: 2010-08-18 | Disposition: A | Discharge status: Home / Self Care | Payer: Medicare Other | Source: Ambulatory Visit | Attending: Geriatric Medicine | Admitting: Geriatric Medicine

## 2010-08-18 DIAGNOSIS — M545 Low back pain: Secondary | ICD-10-CM

## 2010-08-20 LAB — PROTIME-INR
INR: 1.09 (ref 0.00–1.49)
INR: 1.12 (ref 0.00–1.49)
INR: 1.23 (ref 0.00–1.49)
INR: 1.25 (ref 0.00–1.49)
INR: 1.44 (ref 0.00–1.49)
Prothrombin Time: 14 seconds (ref 11.6–15.2)
Prothrombin Time: 15.4 seconds — ABNORMAL HIGH (ref 11.6–15.2)
Prothrombin Time: 15.6 seconds — ABNORMAL HIGH (ref 11.6–15.2)
Prothrombin Time: 17.4 seconds — ABNORMAL HIGH (ref 11.6–15.2)

## 2010-08-20 LAB — COMPREHENSIVE METABOLIC PANEL
AST: 16 U/L (ref 0–37)
CO2: 24 mEq/L (ref 19–32)
Calcium: 9 mg/dL (ref 8.4–10.5)
Creatinine, Ser: 1.1 mg/dL (ref 0.4–1.5)
GFR calc Af Amer: >60 mL/min (ref >60)
GFR calc non Af Amer: >60 mL/min (ref >60)

## 2010-08-20 LAB — CBC
MCHC: 34.4 g/dL (ref 30.0–36.0)
MCV: 93 fL (ref 78.0–100.0)
RBC: 3.73 MIL/uL — ABNORMAL LOW (ref 4.22–5.81)

## 2010-08-20 LAB — APTT: aPTT: 36 seconds (ref 24–37)

## 2010-08-21 LAB — DIFFERENTIAL
Basophils Relative: 1 % (ref 0–1)
Eosinophils Absolute: 0.2 10*3/uL (ref 0.0–0.7)
Monocytes Relative: 10 % (ref 3–12)
Neutro Abs: 4.8 10*3/uL (ref 1.7–7.7)
Neutrophils Relative %: 61 % (ref 43–77)

## 2010-08-21 LAB — CBC
HCT: 34.7 % — ABNORMAL LOW (ref 39.0–52.0)
HCT: 37.4 % — ABNORMAL LOW (ref 39.0–52.0)
Hemoglobin: 11.2 g/dL — ABNORMAL LOW (ref 13.0–17.0)
Hemoglobin: 11.2 g/dL — ABNORMAL LOW (ref 13.0–17.0)
Hemoglobin: 12.9 g/dL — ABNORMAL LOW (ref 13.0–17.0)
MCHC: 33.6 g/dL (ref 30.0–36.0)
MCHC: 34.3 g/dL (ref 30.0–36.0)
Platelets: 173 10*3/uL (ref 150–400)
Platelets: 179 10*3/uL (ref 150–400)
Platelets: 194 10*3/uL (ref 150–400)
RBC: 3.58 MIL/uL — ABNORMAL LOW (ref 4.22–5.81)
RBC: 3.92 MIL/uL — ABNORMAL LOW (ref 4.22–5.81)
RBC: 4.13 MIL/uL — ABNORMAL LOW (ref 4.22–5.81)
RDW: 15 % (ref 11.5–15.5)
RDW: 15.4 % (ref 11.5–15.5)
RDW: 14.6 % (ref 11.5–15.5)
WBC: 6.5 10*3/uL (ref 4.0–10.5)
WBC: 6.8 10*3/uL (ref 4.0–10.5)
WBC: 7.3 10*3/uL (ref 4.0–10.5)
WBC: 7.5 10*3/uL (ref 4.0–10.5)

## 2010-08-21 LAB — BASIC METABOLIC PANEL
BUN: 11 mg/dL (ref 6–23)
CO2: 27 mEq/L (ref 19–32)
Calcium: 8.6 mg/dL (ref 8.4–10.5)
Calcium: 8.8 mg/dL (ref 8.4–10.5)
Calcium: 8.9 mg/dL (ref 8.4–10.5)
Chloride: 107 mEq/L (ref 96–112)
Creatinine, Ser: 0.91 mg/dL (ref 0.4–1.5)
Creatinine, Ser: 0.99 mg/dL (ref 0.4–1.5)
GFR calc Af Amer: >60 mL/min (ref >60)
GFR calc Af Amer: >60 mL/min (ref >60)
GFR calc non Af Amer: >60 mL/min (ref >60)
GFR calc non Af Amer: >60 mL/min (ref >60)
Glucose, Bld: 95 mg/dL (ref 70–99)
Glucose, Bld: 98 mg/dL (ref 70–99)
Potassium: 4.1 mEq/L (ref 3.5–5.1)
Potassium: 4.3 mEq/L (ref 3.5–5.1)
Sodium: 140 mEq/L (ref 135–145)
Sodium: 141 mEq/L (ref 135–145)
Sodium: 142 mEq/L (ref 135–145)

## 2010-08-21 LAB — POCT CARDIAC MARKERS
CKMB, poc: <1 ng/mL — ABNORMAL LOW (ref 1.0–8.0)
CKMB, poc: <1 ng/mL — ABNORMAL LOW (ref 1.0–8.0)
Myoglobin, poc: 85.2 ng/mL (ref 12–200)
Troponin i, poc: <0.05 ng/mL (ref 0.00–0.09)

## 2010-08-21 LAB — VITAMIN B12: Vitamin B-12: 453 pg/mL (ref 211–911)

## 2010-08-21 LAB — COMPREHENSIVE METABOLIC PANEL
ALT: 24 U/L (ref 0–53)
Alkaline Phosphatase: 68 U/L (ref 39–117)
BUN: 17 mg/dL (ref 6–23)
CO2: 25 mEq/L (ref 19–32)
Calcium: 9.5 mg/dL (ref 8.4–10.5)
GFR calc non Af Amer: >60 mL/min (ref >60)
Glucose, Bld: 97 mg/dL (ref 70–99)
Potassium: 4.5 mEq/L (ref 3.5–5.1)
Sodium: 144 mEq/L (ref 135–145)
Total Protein: 7.1 g/dL (ref 6.0–8.3)

## 2010-08-21 LAB — CULTURE, BLOOD (ROUTINE X 2)
Culture: NO GROWTH
Culture: NO GROWTH

## 2010-08-21 LAB — URINALYSIS, ROUTINE W REFLEX MICROSCOPIC
Ketones, ur: NEGATIVE mg/dL
Nitrite: NEGATIVE
Protein, ur: 30 mg/dL — AB
Protein, ur: NEGATIVE mg/dL
Specific Gravity, Urine: 1.006 (ref 1.005–1.030)
Specific Gravity, Urine: 1.014 (ref 1.005–1.030)
Urobilinogen, UA: 0.2 mg/dL (ref 0.0–1.0)

## 2010-08-21 LAB — CARDIAC PANEL(CRET KIN+CKTOT+MB+TROPI)
CK, MB: 1.4 ng/mL (ref 0.3–4.0)
CK, MB: 1.5 ng/mL (ref 0.3–4.0)
Relative Index: INVALID (ref 0.0–2.5)
Relative Index: INVALID (ref 0.0–2.5)
Total CK: 63 U/L (ref 7–232)
Total CK: 71 U/L (ref 7–232)
Troponin I: 0.01 ng/mL (ref 0.00–0.06)
Troponin I: 0.02 ng/mL (ref 0.00–0.06)

## 2010-08-21 LAB — LIPID PANEL
Cholesterol: 90 mg/dL (ref 0–200)
HDL: 36 mg/dL — ABNORMAL LOW (ref >39)
LDL Cholesterol: 26 mg/dL (ref 0–99)
Total CHOL/HDL Ratio: 2.5 RATIO

## 2010-08-21 LAB — PROTIME-INR
INR: 1.39 (ref 0.00–1.49)
INR: 1.45 (ref 0.00–1.49)
INR: 1.54 — ABNORMAL HIGH (ref 0.00–1.49)
INR: 1.67 — ABNORMAL HIGH (ref 0.00–1.49)
INR: 1.42 (ref 0.00–1.49)
Prothrombin Time: 16.9 seconds — ABNORMAL HIGH (ref 11.6–15.2)
Prothrombin Time: 18.4 seconds — ABNORMAL HIGH (ref 11.6–15.2)
Prothrombin Time: 17.2 seconds — ABNORMAL HIGH (ref 11.6–15.2)

## 2010-08-21 LAB — URINE MICROSCOPIC-ADD ON

## 2010-08-21 LAB — URINE CULTURE

## 2010-08-21 LAB — RETICULOCYTES
RBC.: 4.01 MIL/uL — ABNORMAL LOW (ref 4.22–5.81)
Retic Count, Absolute: 40.1 10*3/uL (ref 19.0–186.0)
Retic Ct Pct: 1 % (ref 0.4–3.1)

## 2010-08-21 LAB — IRON AND TIBC
Iron: 78 ug/dL (ref 42–135)
UIBC: 166 ug/dL

## 2010-08-21 LAB — APTT: aPTT: 29 seconds (ref 24–37)

## 2010-08-23 ENCOUNTER — Other Ambulatory Visit: Payer: Self-pay | Admitting: Geriatric Medicine

## 2010-08-23 DIAGNOSIS — M4800 Spinal stenosis, site unspecified: Secondary | ICD-10-CM

## 2010-08-25 ENCOUNTER — Other Ambulatory Visit (INDEPENDENT_AMBULATORY_CARE_PROVIDER_SITE_OTHER): Payer: Medicare Other | Admitting: *Deleted

## 2010-08-25 ENCOUNTER — Ambulatory Visit
Admission: RE | Admit: 2010-08-25 | Discharge: 2010-08-25 | Disposition: A | Discharge status: Home / Self Care | Payer: Medicare Other | Source: Ambulatory Visit | Attending: Geriatric Medicine | Admitting: Geriatric Medicine

## 2010-08-25 DIAGNOSIS — I4891 Unspecified atrial fibrillation: Secondary | ICD-10-CM | POA: Insufficient documentation

## 2010-08-25 DIAGNOSIS — Z7901 Long term (current) use of anticoagulants: Secondary | ICD-10-CM

## 2010-08-25 DIAGNOSIS — M4800 Spinal stenosis, site unspecified: Secondary | ICD-10-CM

## 2010-08-25 NOTE — Patient Instructions (Signed)
Resume coumadin and recheck in 2 weeks.  Patient has been off for epidural.

## 2010-08-27 LAB — GLUCOSE, CAPILLARY: Glucose-Capillary: 92 mg/dL (ref 70–99)

## 2010-09-04 ENCOUNTER — Other Ambulatory Visit: Payer: Self-pay | Admitting: Cardiology

## 2010-09-04 DIAGNOSIS — I4891 Unspecified atrial fibrillation: Secondary | ICD-10-CM

## 2010-09-04 NOTE — Telephone Encounter (Signed)
NEED WARFARIN REFILLED. TOLD PT IT WOULD BE TOMORROW.

## 2010-09-05 MED ORDER — WARFARIN SODIUM 5 MG PO TABS: ORAL_TABLET | ORAL | Status: DC

## 2010-09-05 NOTE — Telephone Encounter (Signed)
Error with e-scribing, faxed rx

## 2010-09-05 NOTE — Telephone Encounter (Signed)
Patient phoned requesting refill

## 2010-10-11 ENCOUNTER — Ambulatory Visit (INDEPENDENT_AMBULATORY_CARE_PROVIDER_SITE_OTHER): Payer: Medicare Other | Admitting: *Deleted

## 2010-10-11 DIAGNOSIS — I4891 Unspecified atrial fibrillation: Secondary | ICD-10-CM

## 2010-10-11 LAB — PROTIME-INR
INR: 3.2 — AB (ref 0.9–1.1)
INR: 3.2 — AB (ref 0.9–1.1)

## 2010-10-17 NOTE — H&P (Signed)
NAME:  Peter Richards, Peter Richards NO.:  [id number]   MEDICAL RECORD NO.:  [id number]          PATIENT TYPE:  EMS   LOCATION:  [facility]                         FACILITY:  [facility]   PHYSICIAN:  Francisca December, M.D.  DATE OF BIRTH:  1929/02/04   DATE OF ADMISSION:  12/15/2006  DATE OF DISCHARGE:                              HISTORY & PHYSICAL   REASON FOR ADMISSION:  Left arm pain/UAP.   IMPRESSIONS:  1. Acute coronary syndrome/unstable angina.  2. History of coronary artery disease/status post coronary artery      bypass graft in 2002.  3. Atrial fibrillation, currently on amiodarone, maintaining sinus      rhythm.  4. Systemic anticoagulation.  5. Atherosclerotic peripheral vascular disease, status post abdominal      aortic aneurysm repair.  6. Remote myocardial infarction.  7. Bladder and prostate carcinoma, status post radical bladder and      prostatectomy, with stoma in place.   PLAN:  1. Hold Coumadin.  2. Begin Lovenox, aspirin, and nitroglycerin drip.  3. Rule out myocardial infarction protocol.  4. Continue other outpatient medications except Coumadin.  5. Further measures per Dr. Cassell Clement.   HISTORY:  Mr. Butcher is a 77 year old man with the above cardiac  history who interestingly had a normal myocardial perfusion study at Dr.  Yevonne Pax office within the past 2 months.  Today, he was at church  and had the abrupt onset of left arm pain.  It was quite sharp to aching  in nature and made his arm heavy.  He was unable to hardly lift it.  He  could not have radiation of the discomfort into his chest.  He became  diaphoretic.  He took two nitroglycerins, with some resolution of  discomfort but became lightheaded.  EMS was called, and he was brought  to the emergency room here.  By the time of his arrival, the discomfort  had primarily resolved.   He recalls having typical angina pectoris with chest discomfort at the  time of his bypass surgery.  He  has not had any cardiac type symptoms  since then.   PAST MEDICAL HISTORY:  As above.   CURRENT MEDICATIONS:  1. Warfarin 5 mg daily, except 7.5 mg twice weekly.  2. Amiodarone 100 mg p.o. daily.  3. Lexapro 10 mg p.o. daily.  4. Next metoprolol ER succinate 50 mg p.o. daily.  5. Amiodarone 10 mg p.o. at bedtime p.r.n.   DRUG ALLERGIES:  CODEINE causes GI distress.   FAMILY HISTORY:  Noncontributory.   SOCIAL HISTORY:  Accompanied to the emergency room this afternoon by his  wife.  He is a nonsmoker.  Occasional alcohol use.   REVIEW OF SYSTEMS:  Negative, except as mentioned above.   PHYSICAL EXAMINATION:  VITAL SIGNS:  Blood pressure 134/52, pulse is 50  and regular, respiratory rate 17, temperature 97, O2 saturation on room  air 99%.  GENERAL:  This is a well-appearing, pleasant 77 year old Caucasian man  in no distress.  HEENT:  Unremarkable.  Head is atraumatic and normocephalic.  The pupils  are equal, round, and reactive to  light.  Sclerae are anicteric.  Oral  mucosa is pink and moist.  Tongue is not coated.  NECK:  Supple, without thyromegaly or masses.  The carotid upstrokes are  normal.  There is no bruit.  There is no JVD.  CHEST:  His chest is clear, with adequate excursion bilaterally.  No  wheezes, rales, or rhonchi.  HEART:  Has regular rhythm.  Normal S1 and S2 is heard.  No S3, S4,  murmur, click, or rub noted.  ABDOMEN:  Soft, nontender.  There is a right lower quadrant stoma in  place.  Urinary still in place.  Bowel sounds are present throughout.  External genitalia is normal male phallus, descended testicles.  No  lesions.  RECTAL:  Not performed.  EXTREMITIES:  Show full range of motion.  No edema.  Intact distal  pulses.  NEUROLOGIC:  Cranial nerves II-XII are intact.  Motor and sensory  grossly intact.  Gait not tested.  SKIN:  Warm, dry, and clear.   ELECTROCARDIOGRAM:  Normal sinus rhythm, normal EKG (sinus bradycardia).   Serum electrolytes,  BUN, and creatinine within normal limits.  Hemoglobin 14.3, hematocrit 42.  Initial CK-MB and troponin are  negative, as are a follow-up study 1-1/2 hours later.   CHEST X-RAY:  No active cardiopulmonary disease.   COMMENTS:  Despite the absence of chest discomfort, the risk factors and  associated symptoms make this a case of unstable angina until proven  otherwise.      Francisca December, M.D.  Electronically Signed     JHE/MEDQ  D:  12/15/2006  T:  12/16/2006  Job:  [id number]   cc:   Cassell Clement, M.D.

## 2010-10-17 NOTE — Op Note (Signed)
NAME:  Peter Richards, Peter Richards            ACCOUNT NO.:  [id number]   MEDICAL RECORD NO.:  [id number]          PATIENT TYPE:  INP   LOCATION:  3316                         FACILITY:  [facility]   PHYSICIAN:  Juleen China IV, MDDATE OF BIRTH:  09/16/1928   DATE OF PROCEDURE:  04/22/2007  DATE OF DISCHARGE:  04/23/2007                               OPERATIVE REPORT   PREOPERATIVE DIAGNOSIS:  Symptomatic left carotid stenosis.   POSTOPERATIVE DIAGNOSIS:  Symptomatic left carotid stenosis.   PROCEDURE PERFORMED:  Left carotid endarterectomy.   ANESTHESIA:  General.   BLOOD LOSS:  100 mL.   FINDINGS:  Hemorrhagic ulcerative plaque at the bifurcation, stenosis  was approximately 50%.   Drains, Blake drain.   Specimens, carotid plaque.   INDICATIONS:  This is a 77 year old gentleman who presented to the  emergency department with right arm and leg weakness.  MRI confined  small focal areas of ischemic insult.  He has resolved mostly his  deficits.  He had a CT angiogram which showed severely ulcerative  plaque, which is likely etiology of his ischemic events.  A raised  incision was made to perform carotid endarterectomy.   PROCEDURE:  After informed consent, the patient was taken to OR 8.  He  was placed supine on the table.  Left neck was prepped and draped in the  standard sterile fashion.  A time-out was called and perioperative  antibiotics were administered.  Longitudinal incision was made along the  anterior border of the sternocleidomastoid.  Bovie cautery was used to  dissect the subcutaneous tissue.  The platysma muscle was divided with  Bovie cautery.  The internal jugular vein and sternocleidomastoid were  dragged laterally.  The facial vein was identified and ligated between 2-  0 silk ties.  Carotid sheath was then identified, first began exposing  the common carotid artery.  This was exposed circumferentially and an  umbilical tape was placed.  Next, the external carotid  and superior  thyroid artery were each individually isolated and encircled with vessel  loops.  Then exposed the internal carotid above bifurcation.  The  hypoglossal nerve was identified and preserved.  This was encircled with  umbilical tape.  At this point in time, the patient was systemically  heparinized.  A baby Earl Lites clamp was placed on the internal carotid.  The vessels were tied.  Now, the external and superior thyroid and a  peripheral DeBakey was placed on the common carotid.  A #11 blade was  used to make an arteriotomy.  Potts scissors were used to open the  arteriotomy.  A #10 straight shunt was then placed.  At this point in  time, the plaque was inspected, it was noted to be hemorrhagic with  ulceration.  A Penfield elevator was used to perform endarterectomy.  One tacking stitch was required at the distal end.  Eversion  endarterectomy was performed of the external carotid.  Heparinized  saline was used to irrigate the endarterectomized fat and all potential  involved debris was removed.  Next, a bovine pericardial patch was  selected for closure.  This was cut to  the appropriate dimensions and a  running anastomosis with 6-0 Prolene was performed.  Prior to completion  of anastomosis, the shunt was removed.  The artery was flushed out in an  antegrade and retrograde fashion.  Heparinized saline was then again  used to irrigate the carotid endarterectomized plaque.  The anastomosis  was then completed.  First, an external flap was released followed by  the common carotid clamp and after approximately 15 seconds, the  internal carotid clamp was then released.  Two repair stitches were  required in the patch.  A Doppler was used to evaluate the common,  internal, and external carotid arteries, which were all widely patent by  Doppler.  Protamine 50 mg was then administered.  Wound was then  copiously irrigated.  Hemostasis was noted to be excellent.  The stature  of the  sternocleidomastoid was reapproximated with several interrupted 3-  0 Vicryl.  The platysma muscles reapproximated with interrupted Vicryl  and skin was closed with running 4-0 Vicryl.  A Blake drain was placed  on top of the patch closure and this was secured in place with a nylon  suture.  The patient was then successfully awakened from anesthesia.  He  was noted to be moving all four extremities upon extubation.  He was  taken to the recovery room in stable condition.           ______________________________  V. Charlena Cross, MD  Electronically Signed     VWB/MEDQ  D:  04/22/2007  T:  04/23/2007  Job:  [id number]

## 2010-10-17 NOTE — Discharge Summary (Signed)
NAME:  Peter Richards, Peter Richards NO.:  [id number]   MEDICAL RECORD NO.:  [id number]          PATIENT TYPE:  INP   LOCATION:  3735                         FACILITY:  [facility]   PHYSICIAN:  Cassell Clement, M.D. DATE OF BIRTH:  January 11, 1929   DATE OF ADMISSION:  12/15/2006  DATE OF DISCHARGE:  12/17/2006                               DISCHARGE SUMMARY   FINAL DIAGNOSES:  1. Chest pain, myocardial infarction ruled out.  2. Coronary atherosclerotic heart disease, status post coronary artery      bypass grafting in 2002.  3. History of prostate and bladder cancer with functioning ileal      conduit.  4. Past history of atrial fibrillation, presently in normal sinus      rhythm on long-term amiodarone and long-term Coumadin.  5. Status post resection and grafting of abdominal aortic aneurysm.  6. History of remote myocardial infarction.   HISTORY:  This 77 year old gentleman was admitted through the emergency  room by Dr. Amil Amen after he developed some left arm pain and numbness  while sitting in church on Sunday morning, December 15, 2006.  He  subsequently took two nitroglycerin simultaneously and thereafter  developed diaphoresis and weakness.  He did not have any nausea and  vomiting.  EMS was called and systolic blood pressure was 102 when EMS  arrived.  The patient was brought to the emergency room where his EKGs  did not show any ischemic changes and his initial point of care cardiac  enzymes were negative, but it was felt that the patient should be  admitted for observation.  Of note is the fact that the patient had had  a normal treadmill Cardiolite stress test several months ago in the  office.   PHYSICAL EXAMINATION:  VITAL SIGNS:  His blood pressure is 129/78,  pulses 50 and regular, temperature 96, O2 sat is 96% on room air.  HEENT:  Bilateral carotid bruits.  HEART:  No gallop and there is a soft systolic ejection murmur at the  aortic area.  No diastolic murmur.  ABDOMEN:  Soft and nontender.  EXTREMITIES:  No phlebitis or edema.   HOSPITAL COURSE:  The patient was placed on telemetry.  Serial enzymes  did not show any evidence of myocardial infarction.  He underwent  carotid Dopplers on December 16, 2006, which showed evidence of moderate  calcific plaque in the bifurcation on the right side and moderate  heterogeneous plaque in the proximal internal carotid artery on the  left.  On the left, there was a 40-60% internal carotid artery stenosis  which is at the lower end of the scale.  The patient had no further arm  or chest pain.  He had a two-dimensional echocardiogram which showed no  embolic source and showed good LV systolic function with  pseudonormalization diastolic dysfunction.  He had a CT brain scan which  did not show any acute intracranial abnormality.  The patient's rhythm  showed sinus bradycardia and pulse dropped to 38 during the night prior  to discharge, and for that reason his Toprol XL is being reduced from 50  mg to 25  mg a day.   ACCESSORY LABORATORY DATA:  His CBC shows hemoglobin 12.9, hematocrit  37.9, white count 7300.  Electrolytes were normal, creatinine 1, sodium  138, potassium 4.6, blood sugar 98, BUN 20.  His INR at the time of  admission was 1.7.  Cardiac enzymes negative x3.  Cholesterol 172, LDL  89, HDL 32, triglycerides 253.   The patient is being discharged improved on a low-cholesterol, heart-  healthy diet.   DISCHARGE MEDICATIONS:  1. He will be on his previous dose of warfarin which is 5 mg daily      except 7.5 mg 2 days a week.  2. Nitrostat 1/150 sublingually p.r.n.  3. Amiodarone 100 mg a day.  4. Toprol XL 50 mg half tablet daily.  5. Lipitor 10 mg daily, which is a new medication for him.  6. Lexapro 10 mg daily.  7. Ambien 10 mg at bedtime.  8. He is requesting coming for back pain.  We will try Darvocet one      every 6-8 hours p.r.n.   The patient be rechecked in the office in one month  for a protime and in  two months for an office visit, protime, fasting lipid panel and a CMET.   CONDITION ON DISCHARGE:  Improved.           ______________________________  Cassell Clement, M.D.     TB/MEDQ  D:  12/17/2006  T:  12/17/2006  Job:  [id number]   cc:   Hal T. Stoneking, M.D.  Valetta Fuller, M.D.

## 2010-10-17 NOTE — Consult Note (Signed)
NAME:  Peter, Richards NO.:  [id number]   MEDICAL RECORD NO.:  [id number]          PATIENT TYPE:  INP   LOCATION:  3712                         FACILITY:  [facility]   PHYSICIAN:  Vesta Mixer, M.D. DATE OF BIRTH:  [birth year]   DATE OF CONSULTATION:  DATE OF DISCHARGE:                                 CONSULTATION   Peter Richards is a 77 year old gentleman with a history of coronary  artery disease.  He is admitted with some TIA symptoms and now needs to  have carotid endarterectomy surgery.  We are asked to see him for  preoperative evaluation.   Peter Richards has a long history of coronary artery disease.  He had a  heart catheterization by Dr. Deborah Chalk in 2002.  He was found to have an  occluded right coronary artery and also significant disease of his left  anterior descending artery.  He underwent coronary artery bypass  grafting and has done very well since that time.  He has had some  fatigue recently, but a stress Cardiolite study performed around 4  months ago revealed no evidence of ischemia and revealed normal left  ventricular systolic function with an ejection fraction of 71%.  He has  not had any particular problems since that time.   This past Thursday, he developed some TIA symptoms with weakness of his  arms.  He presented to the hospital and was found to have symptoms  consistent with a TIA.  Further studies have revealed that he has a 50%  left carotid stenosis, and the plans are to perform a carotid  endarterectomy.   The patient denies any chest pain, shortness breath, syncope or pre-  syncope.  He denies any PND or orthopnea.   CURRENT MEDICATIONS:  At home are:  1. Coumadin 5 mg four days a week and 7.5 mg three days a week,  2. Amiodarone 100 mg every other day.  3. Toprol XL 12.5 mg a day.   ALLERGIES:  HE IS ALLERGIC TO CODEINE.   PAST MEDICAL HISTORY:  1. Coronary artery disease - status post coronary bypass grafting.  2.  Atrial fibrillation/atrial flutter.   SOCIAL HISTORY:  The patient is a former smoker but quit at age 89.   FAMILY HISTORY:  His family history is noncontributory.   REVIEW OF SYSTEMS:  Is reviewed and is essentially negative, except for  as noted in HPI.   EXAMINATION:  GENERAL:  On exam he is an elderly gentleman in no acute  distress.  He is alert nor did x3, and his mood and affect are normal.  VITAL SIGNS:  His heart rate is 48, his blood pressure is 167/77.  HEENT:  Exam reveals 1+ carotids.  He has left a soft left carotid  bruit.  LUNGS:  Clear.  HEART:  Regular rate, S1-S2.  ABDOMEN:  Exam reveals good bowel sounds and is nontender.  EXTREMITIES:  He has no clubbing, cyanosis or edema.  NEURO:  Exam is nonfocal.   1. Peter Richards presents with symptoms that are consistent with TIA.      He has had coronary  bypass grafting 6 years ago, and  a recent      stress Cardiolite study several months ago reveals no evidence of      ischemia and reveals normal left ventricular systolic function.  I      think that he is at low risk for his upcoming carotid      endarterectomy.  2. Bradycardia.  The patient remains bradycardiac.  He has been      bradycardic in the office, and we have been gradually decreasing      his amiodarone and Toprol.  I think that he is quite stable for the      time being.  Will stop the amiodarone for now.           ______________________________  Vesta Mixer, M.D.     PJN/MEDQ  D:  04/21/2007  T:  04/21/2007  Job:  [id number]   cc:   Cassell Clement, M.D.  Hal T. Pete Glatter, M.D.  Jorge Ny, MD

## 2010-10-17 NOTE — Procedures (Signed)
CAROTID DUPLEX EXAM   INDICATION:  Follow-up carotid artery disease.   HISTORY:  Diabetes:  No.  Cardiac:  CABG.  Hypertension:  Yes.  Smoking:  Quit.  Previous Surgery:  Left CEA 04/2007 by Dr. Myra Gianotti.  CV History:  In 2008 had stroke with right arm weakness, no residual.  Amaurosis Fugax No, Paresthesias No, Hemiparesis No.                                       RIGHT               LEFT  Brachial systolic pressure:         158                 160  Brachial Doppler waveforms:         Biphasic            Biphasic  Vertebral direction of flow:        Antegrade           Antegrade  DUPLEX VELOCITIES (cm/sec)  CCA peak systolic                   76                  75  ECA peak systolic                   403                 122  ICA peak systolic                   95                  86  ICA end diastolic                   32                  20  PLAQUE MORPHOLOGY:                  Mixed               N/A  PLAQUE AMOUNT:                      Minimal             N/A  PLAQUE LOCATION:                    ECA/bifurcation/ICA N/A   IMPRESSION:  1. Right ICA shows evidence of 1-39% stenosis.  2. Left ICA shows no evidence of restenosis status post CEA.  3. Right ECA stenosis.  4. No significant changes from previous study.   ___________________________________________  V. Charlena Cross, MD   AS/MEDQ  D:  04/19/2008  T:  04/19/2008  Job:  [id number]

## 2010-10-17 NOTE — Assessment & Plan Note (Signed)
OFFICE VISIT   Peter Richards, Peter Richards  DOB:  1928/12/15                                       06/02/2007  WUXLK#:[id number]   REASON FOR VISIT:  Evaluate carotid incision.   HISTORY:  This is a 76 year old gentleman who underwent left carotid  endarterectomy on April 22, 2007.  He has been doing well from this  perspective; however, he states that he has been having some dime-sized  bloody drainage from the lower aspect of his incision at night.  Of  note, he is also on Coumadin.  He comes in today to have this evaluated.   PHYSICAL EXAMINATION:  There is no evidence of infection.  A small  amount of blood can be expressed from the distal part of his incision.  I elected to explore this and removed the subcutaneous 4-0 Vicryl that  was used to close the skin in this area.  There was no evidence of  infection.  There is no purulent drainage.  I placed a sterile dressing  over the top and told the patient to come back to see me in two weeks.  He is also instructed to call if he had any signs or symptoms infection.   Jorge Ny, MD  Electronically Signed   VWB/MEDQ  D:  06/02/2007  T:  06/03/2007  Job:  [id number]

## 2010-10-17 NOTE — Assessment & Plan Note (Signed)
OFFICE VISIT   Peter Richards, Peter Richards  DOB:  [birth year]                                       05/05/2007  [id number]#:[id number]   REASON FOR VISIT:  Status post left carotid endarterectomy.   HISTORY:  This is a 77 year old gentleman that I initially saw as a  consult in the hospital for right-sided symptoms secondary to stroke.  He had an extensive workup, including a CT scan, ultrasound, and MRA,  which revealed an ulcerative hemorrhagic plaque at his left carotid  bifurcation.  The patient was recovering from his stroke, and for that  reason it was elected to urgently proceed with endarterectomy given the  concern for future emboli from this area in his carotid bifurcation.  On  04/22/2007, the patient underwent an uncomplicated left carotid  endarterectomy with patch angioplasty.  At the time of operation, a  hemorrhagic ulcerative plaque was found at the carotid bifurcation with  an approximate stenosis of 50%.  This was most likely the source of his  stroke.  The patient's hospital course was uncomplicated.  He was  discharged home the following day.  He comes back in today for followup.  He states that he is having improved function of his right hand, and his  writing has improved.  He still does drag his right foot on occasion.  He has had mild difficulty with swallowing.   PHYSICAL EXAMINATION:  His blood pressure is 102/69 on the left, and  136/76 on the right.  Pulse is 56, respirations 18. General:  His  strength is symmetric bilaterally.  He has a slight droop on the left  side at the corner of his mouth.  His tongue is midline.  His voice is  normal.   ASSESSMENT:  Status post left carotid endarterectomy.   PLAN:  The patient will be continued on Coumadin as per his cardiac  issues.  From my perspective, he is doing very well.  He is starting  physical therapy today.  I am going to have him follow up with me in 6  months with an ultrasound.   Jorge Ny, MD  Electronically Signed   VWB/MEDQ  D:  05/05/2007  T:  05/06/2007  Job:  [id number]   cc:   Hal T. Stoneking, M.D.  Cassell Clement, M.D.

## 2010-10-17 NOTE — Procedures (Signed)
CAROTID DUPLEX EXAM   INDICATION:  Followup, carotid artery disease.   HISTORY:  Diabetes:  No.  Cardiac:  CABG.  Hypertension:  Yes.  Smoking:  Quit.  Previous Surgery:  Left CEA in November, 2008 by Dr. Myra Gianotti IV.  CV History:  In 2008, a stroke with right arm weakness, no residual, per  patient.  Amaurosis Fugax No, Paresthesias No, Hemiparesis No                                       RIGHT               LEFT  Brachial systolic pressure:         178                 165  Brachial Doppler waveforms:         Biphasic            Biphasic  Vertebral direction of flow:        Antegrade           Antegrade  DUPLEX VELOCITIES (cm/sec)  CCA peak systolic                   63                  66  ECA peak systolic                   335                 190  ICA peak systolic                   79                  70  ICA end diastolic                   27                  24  PLAQUE MORPHOLOGY:                  Mixed               N/A  PLAQUE AMOUNT:                      Minimal             N/A  PLAQUE LOCATION:                    ECA/bifurcation/ICA ECA   IMPRESSION:  1. Right internal carotid artery shows evidence of 1-39% stenosis.  2. Left internal carotid artery shows no evidence of restenosis,      status post carotid endarterectomy.  3. Bilateral external carotid artery stenosis, right > left.   ___________________________________________  V. Charlena Cross, MD   AS/MEDQ  D:  10/20/2007  T:  10/20/2007  Job:  [id number]

## 2010-10-17 NOTE — Procedures (Signed)
CAROTID DUPLEX EXAM   INDICATION:  Follow up known carotid artery disease.   HISTORY:  Diabetes:  No  Cardiac:  CABG  Hypertension:  Yes  Smoking:  Previous smoker  Previous Surgery:  Left carotid endarterectomy 04/2007  CV History:  CVA in 2008  Amaurosis Fugax No, Paresthesias No, Hemiparesis No                                       RIGHT             LEFT  Brachial systolic pressure:         120               115  Brachial Doppler waveforms:         normal            normal  Vertebral direction of flow:        antegrade         antegrade  DUPLEX VELOCITIES (cm/sec)  CCA peak systolic                   54                83  ECA peak systolic                   489               130  ICA peak systolic                   86                102  ICA end diastolic                   27                32  PLAQUE MORPHOLOGY:                  heterogeneous     none  PLAQUE AMOUNT:                      mild to moderate  none  PLAQUE LOCATION:                    ICA/ECA           none   IMPRESSION:  1. The right internal carotid artery suggests 0-39% stenosis.  2. The left internal carotid artery suggests no restenosis status post      left endarterectomy.  3. Right external carotid artery suggests stenosis.  4. Bilateral vertebrals are noted with antegrade flow.   ___________________________________________  V. Charlena Cross, MD   CB/MEDQ  D:  04/11/2009  T:  04/12/2009  Job:  [id number]

## 2010-10-17 NOTE — Assessment & Plan Note (Signed)
OFFICE VISIT   Peter Richards, Peter Richards  DOB:  21-Sep-1928                                       06/16/2007  [id number]#:[id number]   HISTORY:  This is a 77 year old gentleman who suffered an acute ischemic  insult back in November.  It was felt that this was secondary to carotid  disease; therefore, he underwent left carotid endarterectomy.  The  patient's deficit at the time prior to his procedure was decreased fine  motor use in his right hand.  The patient tolerated his endarterectomy  well.  His only complaint following his surgery was persistent drainage  from a small area on the lower part of his incision.  I saw him two  weeks ago and removed a portion of his subcuticular suture. He comes  back in today for repeated followup.  He says he has not had any  problems with drainage for approximately five days now.  He continues on  his Coumadin and his aspirin.  He is also participating in therapy and  rehab for his stroke.  From a surgical perspective, he is doing very  well.  His incision is well healed.  He has no new deficits following  his operation.  At this point, he will recommend him to continue on his  Coumadin and aspirin.  He will follow up with me in six months with the  repeat ultrasound.   Jorge Ny, MD  Electronically Signed   VWB/MEDQ  D:  06/16/2007  T:  06/17/2007  Job:  [id number]   cc:   Kassie Mends, M.D.  Vesta Mixer, M.D.

## 2010-10-17 NOTE — Op Note (Signed)
NAME:  Peter Richards, Peter Richards NO.:  [id number]   MEDICAL RECORD NO.:  [id number]          PATIENT TYPE:  INP   LOCATION:  3740                         FACILITY:  [facility]   PHYSICIAN:  Angelia Mould. Derrell Lolling, M.D.DATE OF BIRTH:  1928/06/30   DATE OF PROCEDURE:  02/02/2008  DATE OF DISCHARGE:                               OPERATIVE REPORT   PREOPERATIVE DIAGNOSIS:  Chronic cholecystitis with cholelithiasis.   POSTOPERATIVE DIAGNOSIS:  Chronic cholecystitis with cholelithiasis.   OPERATION PERFORMED:  1. Diagnostic laparoscopy.  2. Open cholecystectomy.   SURGEON:  Angelia Mould. Derrell Lolling, MD   FIRST ASSISTANT:  Wilmon Arms. Tsuei, MD   OPERATIVE INDICATIONS:  This is a 77 year old white man who has had an  open abdominal aortic aneurysm surgery repair, total cystectomy with  ileal loop urinary diversion, and coronary artery bypass grafting.  He  presents at this time with a 109-month history of intermittent episodes of  right upper quadrant pain, gas, and bloating which occurs about once a  week almost always after meals.  He often has nausea but has only  vomited once.  He had a gallbladder ultrasound which showed  echodensities within the gallbladder consistent with sludge and mobile  stones.  Liver function tests are normal.  Upper endoscopy was normal.  He was referred for consideration of cholecystectomy.  I discussed this  with him as an outpatient.  I told him that I thought it was very  reasonable to consider cholecystectomy at this time.  I felt that it  would be in his best interest to do this electively rather than  emergently since he is fully anticoagulated.  We discussed this with Dr.  Cassell Clement who felt that it was reasonably safe to undergo this  procedure.  He has been off his Coumadin for 6-7 days, and he is brought  to operating room electively.   OPERATIVE FINDINGS:  When we put the laparoscope in the left subcostal  region, we found that he had very  little free peritoneal space.  There  were extensive adhesions of omentum up against the anterior abdominal  wall.  Multiple loops of small bowel and colon were tangled and densely  adherent to the anterior abdominal wall throughout.  I was able to  breakthrough some thin adhesions and visualize the left lobe of the  liver and the falciform ligament but could not find a window over into  the right upper quadrant.  Because his urostomy was somewhat high on the  right side, I felt that it was probably problematic and possibly unsafe  to continue laparoscopically.  Dr. Corliss Skains agreed and we elected to  abandon the laparoscopic approach and converted to an open  cholecystectomy.   A right subcostal incision was made.  Dissection was carried down  through the subcutaneous tissue, anterior rectus sheath, rectus muscle,  and transversus abdominis.  The peritoneum was elevated, incised, and  entered.  In the immediate right upper quadrant subcostal area, there  were not too many adhesions in the midclavicular line and laterally.  We  opened the incision and found the gallbladder lying under the  liver  somewhat laterally.  It was discolored but was not acutely inflamed,  only slightly thick-walled.  We elevated the gallbladder and placed  retraction.  I dissected the peritoneum and the fatty tissue off from  the neck of the gallbladder.  I found that his cystic artery and cystic  duct were extremely tiny being no more than 1 mm in size.  I dissected  this out carefully to be sure of the anatomy and once this was done, I  felt that there was no way we could do a cholangiogram.  We simply  controlled the cystic artery and cystic duct with multiple metal clips  and divided them.  We then simply dissected the gallbladder from its bed  with electrocautery and removed it from the operative field.  Hemostasis  was good and we achieved with electrocautery.  We irrigated out the  operative field.  At the  completion of the case, there was no bleeding,  no bile leak whatsoever.  Because of his coagulation status, I placed a  small piece of Surgicel in the bed of the gallbladder and observed this  for several minutes and there was no bleeding.  The posterior rectus  sheath and transversus abdominis and internal oblique were closed with  running suture of #1 PDS.  The anterior rectus sheath and external  oblique were closed with running suture of #1 PDS.  All of the skin  incisions were closed with skin staples.  Clean bandages were placed,  and the patient was taken recovery room in stable condition.  Estimated  blood loss was about 40-50 mL.  Complications none.  Sponge, needle, and  instrument counts were correct.      Angelia Mould. Derrell Lolling, M.D.  Electronically Signed     HMI/MEDQ  D:  02/02/2008  T:  02/02/2008  Job:  [id number]   cc:   Tasia Catchings, M.D.  Cassell Clement, M.D.  Hal T. Stoneking, M.D.

## 2010-10-17 NOTE — H&P (Signed)
NAME:  Peter Richards, Peter Richards NO.:  [id number]   MEDICAL RECORD NO.:  [id number]          PATIENT TYPE:  INP   LOCATION:  3712                         FACILITY:  [facility]   PHYSICIAN:  Sabino Donovan, MD        DATE OF BIRTH:  Oct 14, 1928   DATE OF ADMISSION:  04/17/2007  DATE OF DISCHARGE:                              HISTORY & PHYSICAL   CHIEF COMPLAINT:  Right-sided weakness.   HISTORY OF PRESENT ILLNESS:  A 77 year old white male with history of  coronary artery disease, hypertension, status post CABG, prostate and  bladder cancer, hyperlipidemia, and atrial fibrillation who presented  with a complaint of right-sided weakness, reports that the weakness  started this morning after he woke up.  But initially he had trouble  brushing his hair.  Further on when he went to eat breakfast, he had a  difficult time holding cups and spoons.  Initially thought that he had  slept on his hand but then later on noted that while he was walking he  was dragging his right foot.  Otherwise he denies any numbness or  tingling or any other sensory loss.  He denies any loss of vision, and  any difficulty swallowing.  No facial droop.  No dysarthria.  Denies any  chest pain, headache.  He is on Coumadin for atrial fibrillation and a  poor compliance with the medication.  Reports blood pressure is very  well controlled at home.  He has a history of smoking but quit about 26  years ago.   PAST MEDICAL HISTORY:  1. Coronary artery disease, status post what seems to be 2-vessel CABG      in 1981.  2. Hypertension.  3. Prostate and bladder cancer.  He is status post bladder removal.  4. Hyperlipidemia.  5. Atrial fibrillation.  He has been on amiodarone.   FAMILY HISTORY:  Noncontributory.   SOCIAL HISTORY:  He used to smoke about 3 packs per day but quit 26  years ago. No IV drug use.  No alcohol.   ALLERGIES:  HE REPORTS CODEINE GIVES HIM AN UPSET STOMACH, OTHERWISE NO  SPEC IFC SYMPTOMS  FOR ALLERGIES.   MEDICATIONS:  1. He is on Coumadin 5 mg once a day.  2. Toprol XL 50 mg once a day.  3. Amiodarone 200 mg once a day.  4. Lexapro 10 mg once a day.  5. Zolpidem 10 mg once a day.  6. Lipitor 10 once a day.  7. Ocuvite oral 1 tablet once a day.  8. He is not on aspirin.   REVIEW OF SYSTEMS:  In general denies any conditional symptoms.  Denies  any fevers, chills, sweats, weight loss, adenopathy.  HEENT:  Denies any  headache, nasal discharge, bleeds, voice changes, photophobia.  SKIN:  Denies any rash or lesions. CARDIOPULMONARY:  Denies any chest pain,  shortness of breath, dyspnea on exertion, orthopnea PND, edema. GU:  Denies any frequency, urgency, dysuria.  He has suprapubic catheter in  place.  NEUROPSYCH:  As above.  MUSCULOSKELETAL:  Denies any myalgia,  arthralgia, joint swelling.  GI:  Denies any nausea, vomiting, diarrhea,  blood per rectum, edema, melena, hematemesis. ENDO:  Denies any polyuria  or polydipsia, heat or cold intolerance.   PHYSICAL EXAMINATION:  VITAL SIGNS: His temp is 97.3, pulse 40,  respiratory rate 20, blood pressure 151/70.  GENERAL:  He is very pleasant male, intelligent, in no acute distress.  HEENT:  PERRLA.  EOMI.  NECK:  No lymphadenopathy. No thyromegaly.  No JVD.  CHEST:  Clear to auscultation bilaterally.  HEART:  Regular rhythm, otherwise no murmurs, rubs or gallops.  ABDOMEN:  Soft, nontender, nondistended. Normoactive bowel sounds.  He  has a suprapubic catheter in place.  GU:  Deferred.  PELVIC:  Deferred.  EXTREMITIES:  No clubbing, cyanosis or edema.  NEUROLOGIC:  His cranial nerves II-XII are intact. He has decreased  motor strength in his right upper and lower extremity 4/5 and intact  strength in left upper and lower extremity 5/5.  His sensory was intact.  DTRs were 2+ throughout.  He does have a positive dysdiadochokinesis.  He has impaired finger-to-nose testing, moreso in the right than left.   LABORATORY  DATA:  Sodium 140, potassium 4.6, chloride 107, BUN 19, pH  7.34, pCO2 47, bicarb 25.7, INR 2.0, hematocrit 40, hemoglobin 13.6, his  creatinine was 0.9.   CT of head was negative.   EKG shows sinus bradycardia, otherwise normal axis, no significant ST-T  T-wave changes.   IMPRESSION:  A 77 year old who was admitted for suspect cerebrovascular  accident.  1. Suspected cerebrovascular accident.  The patient has 1 similar      episode about 4 months ago and was thought to have a transient      ischemic attack.  He has noticeable right-sided weakness compared      to left and also has abnormal finger-to-nose test concerning for      small stroke.  We will obtain MRI and MRA and carotid Dopplers,      transthoracic echocardiogram.  We will check fasting lipids, TSH,      RPR, B12, homocysteine level, neurology consult is pending.  Also      consult physical therapy and occupational therapy.  2. Hypertension.  Continue Toprol XL for now.  We may need to up      titrate if his blood pressure remains elevated.  3. Atrial fibrillation.  INR is therapeutic, continue Coumadin as well      as amiodarone.  4. Coronary artery disease.  He is not on aspirin.  He may benefit for      low-dose aspirin in addition to Coumadin.  Otherwise we will      continue Toprol.  Also noted that he is not on ACE inhibitor.  We      will defer to is private medical doctor for that and check fasting      lipids.  His Lipitor is 10, his LDL cholesterol is less than 10.  5. Hyperlipidemia. Check fasting lipids and Lipitor.  6. Prophylaxis.  He is already on Coumadin with therapeutic INR.  We      will give Protonix 40 mg p.o. daily.      Sabino Donovan, MD     MJ/MEDQ  D:  04/17/2007  T:  04/18/2007  Job:  [id number]

## 2010-10-17 NOTE — Discharge Summary (Signed)
NAME:  Peter Richards, Peter Richards NO.:  [id number]   MEDICAL RECORD NO.:  [id number]          PATIENT TYPE:  INP   LOCATION:  3740                         FACILITY:  [facility]   PHYSICIAN:  Angelia Mould. Derrell Lolling, M.D.DATE OF BIRTH:  1929/05/24   DATE OF ADMISSION:  02/02/2008  DATE OF DISCHARGE:  02/06/2008                               DISCHARGE SUMMARY   FINAL DIAGNOSES:  1. Chronic cholecystitis with cholelithiasis.  2. Severe intra-abdominal adhesions.  3. Status post abdominal aortic aneurysm repair 1999.  4. Status post myocardial infarction and coronary artery bypass      grafting 2002.  5. Status post total cystectomy, prostatectomy. and ileal loop urinary      diversion 2005.  6. Sinus bradycardia, resolved.   OPERATIONS PERFORMED:  Diagnostic laparoscopy, open cholecystectomy on  February 02, 2008.   HISTORY:  This is a 77 year old white man with the above-mentioned  medical problems.  He gave a 3- 55-month history of intermittent episodes  of right upper quadrant pain, gas, and bloating which would occur about  once a week and almost always after meals.  He has had nausea and 1  episode of vomiting.  He had an ultrasound which shows gallstones.  He  had a hepatobiliary scan which was normal.  He had an upper endoscopy by  Dr. Sherin Quarry which was normal.  Because of his workup being negative  except for gallstones, he was referred for consideration of  cholecystectomy.  The patient was evaluated as an outpatient and  counseled regarding this.  Considering his age, cardiac history, and  anticoagulation, I felt that it would be in his best interest to have an  elective cholecystectomy.  He was taken off his Coumadin for 5 days  preop and admitted electively.   HOSPITAL COURSE:  On the day of admission, the patient was taken to the  operating room.  We were able to place a laparoscopic camera in the left  upper quadrant, but we found that he had extremely dense adhesions  throughout and abandoned the laparoscopic approach.  He underwent open  cholecystectomy, and that was uneventful.  Final pathology showed  chronic cholecystitis with cholelithiasis.   Postoperatively, the patient did fairly well.  For the first couple of  days, he was having moderate amount of pain and was slow to move, but by  the third postop day, he had regained his appetite and ability to  ambulate; by the day of discharge, he was feeling very well, had eaten  all of his breakfast and stated he was ready to go home.   He was maintained on p.o. amiodarone and IV Lopressor in the early  postop period.  His heart rate went down to about 45, and we  discontinued the Lopressor.  He was asymptomatic even with ambulation  with this.  His heart rate came back up to a sinus rhythm of about 65.  I felt that this was reasonable.   At the time of discharge, his abdomen was soft.  His wound was healing  nicely.  He looked good and was in good spirits.  He had  good urinary  output and stable vital signs.   He was going to be discharged back to [facility] where he lives, and  he is going to spend a couple of days in their skilled nursing unit  before transitioning back to his independent living.   DISCHARGE MEDICATIONS:  1. Lexapro 20 mg 1/2 tablet daily.  2. Metoprolol 25 mg 1/2 tablet daily.  3. Dulcolax p.r.n.  4. Zolpidem 10 mg 1/2 tablet daily.  5. Coumadin 5 mg daily.  6. Hydrocodone p.r.n.  7. Amiodarone 100 mg daily.   He was asked to return to see me in the office in 1 week for wound check  and staple removal.      Angelia Mould. Derrell Lolling, M.D.  Electronically Signed     HMI/MEDQ  D:  02/06/2008  T:  02/06/2008  Job:  [id number]   cc:   Tasia Catchings, M.D.  Cassell Clement, M.D.  Hal T. Stoneking, M.D.

## 2010-10-17 NOTE — Discharge Summary (Signed)
NAME:  Peter Richards, KUNST NO.:  [id number]   MEDICAL RECORD NO.:  [id number]          PATIENT TYPE:  INP   LOCATION:  3316                         FACILITY:  [facility]   PHYSICIAN:  Michiel Cowboy, MDDATE OF BIRTH:  06-29-1928   DATE OF ADMISSION:  04/17/2007  DATE OF DISCHARGE:  04/23/2007                               DISCHARGE SUMMARY   DISCHARGE DIAGNOSES:  1. Stroke.  2. History of atrial fibrillation.  3. Left-sided internal carotid artery stenosis, status post      arterectomy.  4. Coronary artery disease.  5. Urinary tract infection.   STUDIES:  1. November 14th, CT angiograph of the neck showed ulcerated,      nonstenotic plaque, left internal carotid artery.  This potentially      could be the source of the patient's emboli.  Severe left external      carotid artery stenosis in the range of 90%.  Calcific non-stenotic      plaque, right carotid bifurcation.  2. Chest x-ray done on April 17, 2007 showing chronic interstitial      lung disease.  No acute cardiopulmonary abnormalities.  3. CT of the head without contrast on April 17, 2007 showing no      acute intracranial abnormalities, chronic small left lower ischemic      changes, brain atrophy.  4. MRI of the brain done on April 18, 2007 showing nonspecific      probable small vessel disease with small focal areas of restricted      diffusion, one on the left frontal subcortical white matter and the      left parietal occipital subcortical white matter regions, most      consistent with acute ischemia.   Operative report on April 22, 2007:  Symptomatic left carotid  stenosis, performed by Dr. Durene Cal.  Left carotid endarterectomy.   CONSULTATIONS:  1. Dr. Cira Servant.  2. Dr. Myra Gianotti.   HISTORY AND PHYSICAL:  Please see full H&P but briefly, a 77 year old  gentleman presented to the emergency department with right-sided arm and  leg weakness.  MRI showed small focal areas of  ischemic insult.  The  patient prior to this already was on Coumadin for his coronary artery  disease and Coumadin for his atrial fibrillation.  CT angiogram of the  neck showed no ulcerative plaque.  On November 18th, a left-sided  endarterectomy was performed.  The patient tolerated the procedure well.  On the 19th was discharged to home.  Neurology has seen and consulted on  the patient and agreed that given the nature of plaque, he will likely  benefit from operative intervention.  Also, given a history of coronary  artery disease, [facility] has consulted on the patient as  well.  The patient was pre-opped from a cardiologic standpoint and felt  that was relatively low risk.   History of atrial fibrillation:  His amiodarone was held prior to the  procedure.  His Coumadin was also held prior to the procedure, given the  patient required operative intervention.  His Coumadin was changed to 5  mg p.o. daily.  He was added on Tricor 135 mg p.o. daily and for  hyperlipidemia control.   Of note, the patient had a urinary tract infection, which was treated  with antibiotics.  Patient completed a full course while in-house.   DISCHARGE MEDICATIONS:  1. Amiodarone 200 mg p.o. every other day.  2. Coumadin 100 mg p.o. daily.  3. Zolpidem 5 mg p.o. nightly.  4. Toprol XL 25 mg p.o. daily.  5. Lipitor 10 mg p.o. daily.  6. Tricor 145 mg p.o. daily.  7. Aspirin 81 mg p.o. daily.  8. Percocet 5/325 mg q.4h. p.r.n. pain.   Patient is to follow up with his primary Dimitris Shanahan as well as with  neurology.  Primary care Wetona Viramontes is Dr. Pete Glatter.  Follow up with  patient's INR levels.      Michiel Cowboy, MD  Electronically Signed     AVD/MEDQ  D:  06/10/2007  T:  06/10/2007  Job:  [id number]   cc:   Hal T. Stoneking, M.D.  Cassell Clement, M.D.  Jorge Ny, MD

## 2010-10-17 NOTE — Consult Note (Signed)
NAME:  Peter Richards, Peter Richards            ACCOUNT NO.:  [id number]   MEDICAL RECORD NO.:  [id number]          PATIENT TYPE:  INP   LOCATION:  3712                         FACILITY:  [facility]   PHYSICIAN:  Juleen China IV, MDDATE OF BIRTH:  May 10, 1929   DATE OF CONSULTATION:  DATE OF DISCHARGE:                                 CONSULTATION   REQUESTING PHYSICIAN:  Dr. Evlyn Clines.   REASON FOR CONSULT:  Evaluate right-sided weakness.   HISTORY:  This is a 77 year old gentleman with history of heart disease,  hypertension, prostate and bladder cancer, hyperlipidemia, atrial  fibrillation, who presented to the emergency department with right-sided  weakness that started that day.  His weakness progressed where he had  difficulty holding his utensils at breakfast.  He also noticed later  that morning that he was having trouble moving his right foot.  He did  not have any sensation loss or vision loss such as amaurosis fugax.  There was no dysarthria or facial drooping.  He has undergone MR  imaging, which shows small foci of restricted diffusion on MR consistent  with acute ischemia.  He has also undergone CT angiogram, which reveals  an ulcerated left internal carotid plaque as a possible source for  emboli in the setting of 90% left external carotid stenosis.  I am asked  to evaluate for further management.  This is this patient's first  episode of these difficulties.  They have been improving with time such  that now he only has fine motor skill difficulty in his right hand.  There were no alleviating or exacerbating factors.  Again, the patient  has had coronary artery disease in the past and has undergone CABG.  He  does not currently have chest pain.  He has also atrial fibrillation for  which he has been maintained on Coumadin.  His INR is 1.9 when he  arrived.  He is hypertensive.  However, his blood pressure has been  moderately controlled and that it has been less than 160 since he has  been in the hospital.   REVIEW OF SYSTEMS:  GENERAL:  No fevers, chills, no weight loss or  adenopathy.  HEENT:  There is no headache.  No vision changes.  SKIN:  There is no rash.  CARDIOVASCULAR:  No chest pain, shortness of breath,  or dyspnea on exertion.  PULMONARY:  There is no shortness of breath.  No wheezing.  No asthma.  GU:  Negative.  GI:  Negative.  MUSCULOSKELETAL:  Positive for the above mentioned findings.  NEUROLOGIC:  Positive for the above mentioned findings.  HEMATOLOGIC:  No bleeding difficulties.  PSYCH:  No depression.  ENDOCRINE:  No  polyuria, polydipsia, or heat or cold intolerance.   PAST MEDICAL HISTORY:  Significant for coronary artery disease,  hypertension, bladder and prostate cancer, hyperlipidemia, and atrial  fibrillation.   PAST SURGICAL HISTORY:  Two-vessel CABG in 1981 and bladder removal.   FAMILY HISTORY:  Noncontributory.   SOCIAL HISTORY:  Used to smoke, history of tobacco three packs per day,  but quit 26 years ago; no alcohol, no IV drugs.  ALLERGIES:  CODEINE.   MEDICATIONS:  Coumadin, Toprol, amiodarone, Lexapro, zolpidem, Lipitor  and Ocuvite.   PHYSICAL EXAMINATION:  VITAL SIGNS:  Temperature is 97.3, pulse is in  the 50s, blood pressure is 138/71 and O2 saturation is 98% on room air.  GENERAL:  He is well appearing in no acute distress.  HEENT:  Normocephalic and atraumatic.  Sclerae are anicteric.  NECK:  Supple without mass.  No JVD.  CARDIOVASCULAR:  Irregular with no murmurs, rubs, or gallops.  PULMONARY:  Clear bilaterally.  EXTREMITIES:  No clubbing, cyanosis, or edema.  ABDOMEN:  Soft, nontender.  No palpable masses.  NEUROLOGIC:  Cranial nerves II through XII are grossly intact.  He has  decreased fine motor use in the right hand.  His strength is good, equal  to his left.  PSYCH:  He is alert and oriented x3.   ASSESSMENT AND PLAN:  Status post acute stroke or acute ischemic changes  on MR in the setting of an  ulcerative left internal carotid plaque.  I  spent in excess about one hour reviewing the patient's scans and talking  with the patient and his family in conjunction with our neurology team.  Given that the patient did not have evidence of embolic source on his  echo, most likely source for his stroke is coming from his ulcerated  plaque in the left internal carotid.  This is not a hemodynamically  significant stenosis associated with this lesion.  However, there  appears to be a fair amount of thrombus and ulceration in this region.  For that reason, I would recommend proceeding with a carotid  endarterectomy.  The patient is on Coumadin currently.  We will need to  stop that and maintain him on heparin until the time of his operation.  I discussed with the patient the risks associated with the surgery  including stroke, nerve injury, and detailed the typical postoperative  course.  We will be following the patient closely over the next couple  of days in anticipation of his operation.      Jorge Ny, MD  Electronically Signed     VWB/MEDQ  D:  04/20/2007  T:  04/21/2007  Job:  [id number]

## 2010-10-17 NOTE — Consult Note (Signed)
NAME:  Peter Richards, Peter Richards NO.:  [id number]   MEDICAL RECORD NO.:  [id number]          PATIENT TYPE:  INP   LOCATION:  3712                         FACILITY:  [facility]   PHYSICIAN:  Deanna Artis. Hickling, M.D.DATE OF BIRTH:  1928/08/26   DATE OF CONSULTATION:  04/17/2007  DATE OF DISCHARGE:                                 CONSULTATION   CHIEF COMPLAINT:  Right-sided incoordination.   HISTORY OF PRESENT ILLNESS:  The patient is a 77 year old right-handed  gentleman who awakened this morning around 7:30 and noted that he had  difficulty combing his hair and feeding himself.  He did not seek care.  By 3:00 p.m., he had clumsy right lower extremity and did seek care.  He  arrived at [facility] around 1816.  Call was placed to  neurology at 2038.  I reviewed the CT scan 2140 and began examination  2145.   The patient was last known normal 2145, November 12.  He had one prior  TIA involving the left arm in July 2008; CT scan negative.  I am not  certain about the rest of the extent of workup.   The patient's risk factors for stroke include chronic atrial  fibrillation, hypertension, dyslipidemia.  In addition, he has had  myocardial infarction.  He has prostate and bladder cancer.  Surgeries  include appendectomy in 1960s, abdominal aortic aneurysm repair in 1999,  coronary artery bypass graft, hemilaminectomy for C5-6 burst, surgery on  his left knee and also his left shoulder (2002), epidural injections for  spinal stenosis 2003, skin cancer treated.   His current medications include:  1. Coumadin 5 mg daily.  2. Toprol 50 mg daily.  3. Amiodarone 200 mg daily.  4. Lexapro 10 mg daily.  5. Zolpidem 10 mg daily.  6. Lipitor 10 mg daily.  7. Ocuvite 1 tablet daily.   DRUG ALLERGIES:  None known.   INTOLERANCES:  CODEINE - GI UPSET; ASPIRIN - GI UPSET.   FAMILY HISTORY:  Mother died at age 71 of congestive heart failure,  father died age 60 of gastric  cancer.  Sister died of hemorrhage from  Coumadin.  She had a coronary artery bypass graft and mitral valve  replacement.   SOCIAL HISTORY:  The patient is married.  He has three children.  He is  a retired Pharmacist, hospital.  He quit smoking in 1982 after a 60 pack-year  history of smoking.  He drinks alcohol occasionally but not regularly.  He does not use illicit drugs.   REVIEW OF SYSTEMS:  Is negative except as noted above in the history of  present illness and is recorded on the chart.   EXAMINATION:  Today, temperature 97.3, blood pressure 155/80, resting  pulse 52, respirations 16, oxygen saturation 98%.  HEENT:  No infections.  NECK:  No bruits or meningismus.  No loss of range of motion.  LUNGS:  Clear.  HEART:  No murmurs.  Pulses normal.  I did not note an irregularly  irregular pulse.  ABDOMEN:  Soft.  Bowel sounds normal.  SKIN:  Was normal.  NEUROLOGIC EXAMINATION:  Awake, alert without dysphasia.  Cranial nerves  are intact.  Motor examination:  Normal strength in all for extremities  except 4/5 in the right hip flexor.  Increased sensation (hyperesthesia  to pinprick on the face and arm).  Good stereoagnosis bilaterally.  He  has mild right reflex preponderance.  He has right hemidystaxia in the  arm greater than leg and a mild right hemidystaxic gait.  He has  bilateral flexor plantar responses.  Neurologic examination is otherwise  normal.   REVIEW OF HIS LABORATORIES:  CT scan of the brain shows no infarction.  PT is 23.3, INR 2 - he says it was 2.7 yesterday.  Hemoglobin 13.6,  hematocrit 40.  Sodium 140, potassium 4.6, chloride 107, CO2 25.7, BUN  19, creatinine 0.9, glucose 88.  EKG shows sinus rhythm with first-  degree heart block and bradycardia.   IMPRESSION:  1. Nonhemorrhagic right brain infarction.  I suspect the region of the      posterior limb of the internal capsule which would cause altered      sensation and a sensory dystaxia as well as mild  right hemiparesis.      Certainly other locations are possible including the pons or the      cerebellar peduncles.  2. Hypertension, dyslipidemia, atrial fibrillation on Coumadin as risk      factors for stroke.   PLAN:  Noninvasive workup including MRI brain, MRA intracranial, carotid  Doppler, 2-D echocardiogram, hemoglobin A1c, lipid panel and serum  homocystine, among other labs.  Aspirin has been started.  I would  discontinue aspirin and just adjust his Coumadin.  He is not eligible  for clinical stroke research trial, tPA or clot extraction because of  the delay between when he was last known normal and when he presented  for evaluation.  He has passed his swallow screen.  I discussed the  outline of the workup.  He will be seen by our stroke service in  consultation with the primary caregivers.  His NIH stroke scale today  was 3.  Modified Rankin score was 0.      Deanna Artis. Sharene Skeans, M.D.  Electronically Signed     [facility]/MEDQ  D:  04/17/2007  T:  04/18/2007  Job:  [id number]   cc:   Hal T. Stoneking, M.D.  Francisca December, M.D.

## 2010-10-20 NOTE — Discharge Summary (Signed)
[place]. [facility]  Patient:    [name], [name] Visit Number: [id number] MRN: [id number]          Service Type: SUR Location: 2000 2007 01 Attending Physician:  Waldo Laine Dictated by:   Lissa Merlin, P.A. Adm. Date:  12/27/2000 Disc. Date: 01/02/2001   CC:         CVTS Office  Thomas A. Patty Sermons, M.D.  Dr. Prentiss Bells   Discharge Summary  DATE OF BIRTH:  Aug 29, 1928  SURGEON:  Dr. Sheliah Plane.  CARDIOLOGY:  Dr. Patty Sermons.  PRIMARY CARE:  Dr. Prentiss Bells.  ADMISSION DIAGNOSES: 1. Unstable angina. 2. Known coronary artery disease. 3. Abnormal Cardiolite test.  DISCHARGE DIAGNOSES: 1. Unstable angina. 2. Known coronary artery disease. 3. Abnormal Cardiolite test. 4. Postoperative atrial fibrillation, rate controlled on amiodarone.  PREEXISTING MEDICAL CONDITIONS: 1. Known coronary artery disease with previous myocardial infarction in 1981,    and cardiac catheterization with ejection fraction 61%. 2. Hypertension. 3. AAA repair in 1999. 4. Hypercholesterolemia. 5. Extracranial cerebrovascular occlusive disease. 6. Skin cancer. 7. Remote diverticulosis. 8. Neck surgery.  HISTORY OF PRESENT ILLNESS:  [name] is a 77 year old white male who was referred by Dr. Patty Sermons.  He developed chest pain, relieved with nitroglycerin.  A Cardiolite test was done which showed apical ischemia and transient left ventricular dilatation.  A follow-up cardiac catheterization showed severe two-vessel coronary artery disease with proximal to mid high-grade LAD stenosis with peripheral dysfunction.  After reviewing the data, Dr. Tyrone Sage recommended proceeding with CABG.  The risks, benefits, details, and alternatives were discussed with the patient, who agreed to proceed.  HOSPITAL COURSE:  He came into the hospital for the elective procedure on December 27, 2000.  He had an off-pump CABG x 2, with LIMA to LAD and saphenous  vein graft to RCA.  There were no complications.  He was taken to [facility] in stable condition.  Postoperatively he did well with routine care.  On postoperative day #1 he was notable to be doing well and suitable for transfer to Unit 2000. He was in sinus rhythm.  On postoperative day #2 Mr. Stancil was stable, doing well.  He did, however, develop rate-controlled atrial fibrillation. This was initially treated with Lopressor and diltiazem.  He was put on Lovenox.  On postoperative day #3 he continued with rate-controlled atrial fibrillation.  Cardiology was also seeing Mr. Thamas Jaegers.  They recommended starting amiodarone and Coumadin.  This was done.  Thyroid tests were done, which were normal.  Mr. Vantine continued to progress well in all other areas.  He was working with cardiac rehabilitation, doing well.  He had no other problems.  Because of some bradycardia his Cardizem was discontinued, and he continued only on the amiodarone.  His rate remained controlled.  By January 02, 2001, postoperative day #6, Mr. Mcmurtry was doing very well.  Hew afebrile, vital signs were stable.  He was in rate-controlled atrial fibrillation.  He was below his preoperative weight.  His INR was 2.0. Physical exam was satisfactory.  Wounds were healing well.  He was deemed stable for discharge home and was subsequently discharged.  PROCEDURES:  Off-pump CABG x 2 on December 27, 2000, with LIMA to LAD and saphenous vein graft to RCA.  MEDICATIONS: 1. Amiodarone 200 mg tablet, 1 tablet three times a day. 2. Coumadin 5 mg tablet, 1 tablet on the evening of discharge.  INR to be    drawn the next day, with subsequent dosing  per cardiology. 3. Lopressor 50 mg tablet, 1/2 tablet every 12 hours. 4. Percocet 1-2 every four to six hours as needed for severe pain. 5. Tylenol P.M. p.r.n. for sleep.  ALLERGIES:  CODEINE.  SPECIAL INSTRUCTIONS:  No driving, no strenuous activity, no lifting over 10 pounds.  He  can shower.  Walk daily.  Use his incentive spirometer daily. Clean his wounds daily with soap and water.  To be alert for increasing redness, swelling, drainage, or fever.  He is to get a chest x-ray when he sees his cardiologist in two weeks and to bring it with him to see Dr. Tyrone Sage.  He is told to have his blood drawn to check his protime on January 03, 2001, in Dr. Jenness Corner office.  FOLLOW-UP: 1. Dr. Tyrone Sage on Thursday, January 23, 2001. 2. Protime on January 03, 2001, Dr. Jenness Corner office. 3. Dr. Patty Sermons two weeks after discharge. Dictated by:   Lissa Merlin, P.A.  Attending Physician:  Waldo Laine DD:  01/21/01 TD:  01/21/01 Job: [id number]

## 2010-10-20 NOTE — Op Note (Signed)
[place]. [facility]  Patient:    Peter Richards, Peter Richards                   MRN: [id number] Proc. Date: 12/30/00 Adm. Date:  [id number] Attending:  Waldo Laine CC:         Clovis Pu. Patty Sermons, M.D.   Operative Report  PREOPERATIVE DIAGNOSIS:  Coronary occlusive disease.  POSTOPERATIVE DIAGNOSIS:  Coronary occlusive disease.  OPERATION PERFORMED:  Off pump coronary artery bypass grafting x 2 with the left internal mammary artery to the left anterior descending coronary artery and reversed saphenous vein graft to the distal right coronary artery.  SURGEON:  Gwenith Daily. Tyrone Sage, M.D.  FIRST ASSISTANT:  Lissa Merlin, P.A.  ANESTHESIA:  General.  INDICATIONS FOR PROCEDURE:  The patient is a 77 year old male who presented with vague atypical chest discomfort, fatigue with exertion and shortness of breath.  A Cardiolite stress test was performed by Dr. Patty Sermons which was positive for ischemia.  Cardiac catheterization was done which demonstrated total occlusion of the right coronary artery, 80% proximal LAD lesion associated with a small coronary artery aneurysm.  The circumflex was a large vessel supplying collaterals to the right without significant lesion.  Because of the patients increasing symptoms and positive stress test, coronary artery bypass grafting was recommended.  The patient agreed and signed informed consent.  DESCRIPTION OF PROCEDURE:  With Swan-Ganz and arterial line monitors in place, the patient underwent general endotracheal anesthesia without incident.  The skin of the chest and legs was prepped with Betadine and draped in the usual sterile manner.  A segment of vein was harvested first from the right lower extremity and was of good quality and caliber.  A median sternotomy was performed and the left internal mammary artery was dissected down as a pedicle graft.  The distal artery was divided and had good free flow.  The  pericardium was opened.  Examination of the heart and position of the LAD appeared to be suitable for off-pump bypass.  Stay sutures were placed in the pericardium to assist in elevation of the heart.  A [employer] stabilization system with elastic tapes around the LAD was selected.  The patient was systemically heparinized. The heart positioned with the stabilizing foot.  The LAD was opened.  The elastic tapes secured providing good visualization of the operative field. Using a running 8-0 Prolene, the left internal mammary artery was anastomosed to the LAD.  The anastomosis was flushed, the tapes were removed and the foot removed.  Using the pedicle of the mammary artery was tacked to the epicardium.  Using elastic tapes on the distal right coronary artery which had been exposed, the heart was gently elevated and the elastic tapes provided enough stabilization to open the distal right coronary artery and a vein graft was anastomosed to the distal right with a running 7-0 Prolene.  Prior to the distal anastomosis, a partial occlusion clamp had been placed on the ascending aorta.  A single punch aortotomy was performed and the proximal anastomosis was completed with a running 6-0 Prolene.  Prior to complete closure of the distal anastomosis, the vein graft was flushed and anastomosis completed.  The patient remained hemodynamically stable throughout the procedure.  The pericardium was loosely reapproximated.  A left pleural tube and two mediastinal tubes were left in place.  Two atrial and two ventricular pacing wires were applied.  Graft marker was applied.  The sternum was closed with #6 stainless steel wire,  the fascia was closed with interrupted 0 Vicryl and running 3-0 Vicryl in the subcutaneous tissue and 4-0 subcuticular stitch in the skin edges.  Dry dressings were applied.  The sponge and needle counts were reported as correct at completion of the procedure.  The patient tolerated the  procedure without obvious complication and was transferred to the surgical intensive care unit for further postoperative care. DD:  12/30/00 TD:  12/30/00 Job: [id number]

## 2010-10-20 NOTE — Cardiovascular Report (Signed)
[place]. [facility]  Patient:    Peter Richards, Peter Richards                   MRN: [id number] Proc. Date: 12/04/00 Adm. Date:  [id number] Attending:  Eleanora Neighbor CC:         Thomas A. Patty Sermons, M.D.   Cardiac Catheterization  INDICATIONS:  The patient has a long history of coronary artery disease.  He had remote myocardial infarction.  He had an aneurysm repaired in 1999.  His last catheterization was in 1988.  He had a stress nuclear study which showed apical scar and reversibility around the apical ischemia and felt to have transient dilatation.  He is referred for evaluation.  PROCEDURE:  Left heart catheterization with selective coronary angiography, left ventricular angiography.  TYPE AND SITE OF ENTRY:  Percutaneous right femoral artery.  CATHETERS:  A 6 French 4 curved Judkins right and left coronary catheters, 6 French pigtail ventriculographic catheter.  CONTRAST MATERIAL:  Omnipaque.  MEDICATIONS GIVEN PRIOR TO THE PROCEDURE:  Valium 10 mg p.o.  MEDICATIONS GIVEN DURING THE PROCEDURE:  Versed 2 mg IV.  COMMENTS:  The patient tolerated the procedure well.  HEMODYNAMIC DATA:  The aortic pressure was 133/66,  LV is 138/16.  There was no aortic valve gradient noted on pullback.  ANGIOGRAPHIC DATA: 1. Right coronary artery:  The right coronary artery is totally occluded.    There is left to right collaterals present. 2. Left main:  The left main coronary artery is normal. 3. Left anterior descending:  The left anterior descending is a reasonably    large vessel.  In the midportion, there is an aneurysmal dilatation.  There    is an area of segmental disease with calcification after the first septal    perforating branch.  After this aneurysmal dilatation there appears to be    an 80% narrowing and some haziness of the vessel.  One small diagonal    vessel arises after the severe stenosis. 4. Left circumflex:  The left circumflex is a  large vessel with a large    posterolateral branch.  It supplies the majority of the collaterals to the    distal right coronary artery bed.  There are irregularities in the left    circumflex but no real focal narrowing.  There is a large obtuse marginal    that bifurcates and extends towards the apex.  The posterolateral branch    is a relatively large vessel.  LEFT VENTRICULAR ANGIOGRAM:  The left ventricular angiogram was performed in the RAO position.  Overall cardiac size and silhouette were normal.  There is mild apical hypokinesia but the global left ventricular ejection fraction is probably estimated to be 60%.  There is no mitral regurgitation, intracardiac calcification or intracavitary filling defect.  OVERALL IMPRESSION:  Severe two-vessel disease totally occluded right with left to right collaterals with an 80% lesion in the left anterior descending.  DISCUSSION:  This is somewhat a difficult decision to make regarding medical management versus surgery.  The right coronary artery is totally occluded and is collateralized from the left system, but predominantly from the relatively large left circumflex.  The left circumflex does not have severe obstructive disease.  The left anterior descending has an 80% narrowing after an aneurysmal dilatation and has somewhat of a mushroom type appearance which is concerning.  However, this lesion is in the proximal midportion of the vessel and after the first septal perforating branch.  It is an area of somewhat diffuse disease and a 2.5 mm vessel and would not be ideally suited for percutaneous intervention.  It also should be noted that the area of questionable ischemia was noted at the apex and would be supplied probably by this distribution, but also could be considered from the inferior posterior descending artery as well.  The patient would be a good candidate for coronary artery bypass grafting at this juncture but should there be  any mitigating factors, could be managed medically. DD:  12/04/00 TD:  12/04/00 Job: [id number]

## 2010-10-20 NOTE — H&P (Signed)
NAME:  Peter Richards, VITAL NO.:  [id number]   MEDICAL RECORD NO.:  [id number]          PATIENT TYPE:  INP   LOCATION:  X528                         [facility]AC[facility]   PHYSICIAN:  Valetta Fuller, M.D.  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   HISTORY OF PRESENT ILLNESS:  [name] is a 77 year old male who has a  somewhat complex history. The patient had initially presented a couple of  years ago with significant progressive voiding symptoms. He initially  responded to Flomax. Approximately 18 months ago he had sudden onset of much  worse irritative voiding symptoms and cystoscopy revealed a mass in his  bladder. 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  The patient's symptoms resolved with a course of steroids and Atarax. The  patient then did well for about 12 months. He had recurrent irritative  voiding symptoms with a positive culture and with improvement in his  voiding. Follow-up revealed continued inflammatory process in his bladder.  Subsequent resection revealed a poorly differentiated superficially-invasive  transitional cell carcinoma. Limited resection of his bladder neck also  revealed transitional cell carcinoma in situ involving his prostatic  urethra. The patient has persisted in having severe obstructive and  irritative voiding symptoms. He does not have documented muscle invasive  disease but does have T1 poorly differentiated transitional cell carcinoma  that is multifocal and involving the prostatic urethra. We feel that given  his pathology as well as his severe voiding symptoms that he would be better  treated with cystectomy. The patient did have a second opinion and has  elected to proceed with cystectomy and ileal loop urinary diversion.  The  patient appeared to understand the advantages and disadvantages of this type  of approach. He understands other options could include chemotherapy and  radiation or BCG with careful surveillance, but these treatments are likely  to worsen his voiding dysfunction. At this point the patient complains of  severe frequency with intermittent hematuria, urgency, and at times a weak  urinary stream.   PAST MEDICAL HISTORY:  Significant for coronary artery disease. The patient  has had bypass surgery in the past. The patient did have recent cardiac  assessment. This included a recent nuclear adenosine Cardiolite study which  showed no evidence of ischemic changes. The patient has also had an  abdominal aneurysm in the past which was repaired approximately 6-7 years  ago with a tube graft.   CURRENT MEDICATIONS:  Include Lexapro, metoprolol, Ditropan XL, Flomax,  Colace, Tylenol, recent bowel prep, and p.r.n. nitroglycerin.   The patient has an allergy or intolerance to CODEINE.   SOCIAL HISTORY:  Otherwise noncontributory. He does have a previous history  of tobacco use but none recent.   SURGICAL HISTORY:  Notable for, again, multiple bladder resections and  biopsies as well as bypass surgery and AAA repair.   PHYSICAL EXAMINATION:  GENERAL:  He is a well-developed,  well-nourished  male.  VITAL SIGNS:  His blood pressure today was 120/74 with a pulse of 49 and  respiratory rate of 16. His current weight is 196 pounds and he is afebrile.  NECK:  Shows no JVD.  CHEST:  Clear to auscultation.  ABDOMEN:  Soft and flat without palpable masses. He does have a well-healed  lower midline incision consistent with his previous aortic aneurysm repair.  There is no evidence of obvious hernia.  GENITOURINARY:  Penis, scrotum, testes and adnexal structures are normal.  Prostate is 1-2+ without worrisome nodules or induration.  EXTREMITIES:  Showed no obvious edema.   DATA:  BMET is within  normal limits with a creatinine of 1.0. Preoperative  hemoglobin is 13.5.   ASSESSMENT:  Transitional cell carcinoma of the bladder. The patient, again,  has T1, grade 3 disease with associated carcinoma in situ in the prostatic  urethra. He presents now for radical cystoprostatectomy with bilateral  pelvic lymph node dissection and ileal loop urinary diversion. Again, the  patient appeared to understand the advantages and disadvantages of this  approach and management of his disease. He appeared to understand the  complications inherent with a surgery of this magnitude. He had also  undergone extensive discussion with regard to neobladder versus cutaneous  urinary diversion and chose for a simpler approach with the ileal loop. The  patient has done a full formal bowel prep and has been marked for stomal  marking. He will receive preoperative antibiotics, DVT prophylaxis, and  surgery will be performed today.           ______________________________  Valetta Fuller, M.D.  Electronically Signed     DSG/MEDQ  D:  04/25/2005  T:  04/25/2005  Job:  [id number]

## 2010-10-20 NOTE — Consult Note (Signed)
[facility]  Patient:    Peter Richards, Peter Richards Visit Number: [id number] MRN: [id number]          Service Type: PMG Location: [facility] Attending Physician:  Sondra Come Dictated by:   Sondra Come, D.O. Proc. Date: 06/16/01 Admit Date:  06/12/2001   CC:         Peter Richards, M.D.   Consultation Report  REFERRING PHYSICIAN:  Dr. Sherin Quarry.  Dear Dr. Tresa Endo,      Thank you very much for kindly referring Peter Richards to [facility] for a trial of lumbar epidural steroid injections.  Peter Richards was seen in clinic today, and underwent an uneventful lumbar epidural steroid injection.  Please refer to the following for details regarding the history and physical examination and treatment. Once again, thank you for allowing [place] to participate in the care of Peter Richards.  CHIEF COMPLAINT:  Low back pain with right lower extremity pain.  HISTORY OF PRESENT ILLNESS:  [name] is a pleasant 77 year old right hand dominant male who complains of a six week history of low back pain radiating into his right lower extremity, including his entire thigh and entire leg, but predominately over the anterolateral and anterior leg.  The patient denies any numbness or paresthesias.  The patients symptoms seem to be worse with sitting and improved with standing and walking.  His symptoms are also somewhat alleviated with Tylox 5 mg four to five per day.  He underwent a MRI of his lumbar spine which revealed moderate multifactorial stenosis at L4-5 on the basis of hypertrophic degenerative facet disease with anterior listhesis of L4 and L5, and circumferentially protruding disk material.  There was disk material most prominent within the foramen on the right, and there was right foraminal stenosis, such that the right L4 nerve root could be affected.  There was also noted chronic degenerative disc disease at  L5-S1 with stenosis of the lateral recesses, more on the left than on the right, and bilateral neural foraminal narrowing, more severe on the left than the right.  The patients pain is 10/10 on a subjective scale, and described as horrible, constant, stabbing.  Function and quality of life indices have declined.  Sleep is poor, but better with the addition of temazepam at bedtime.  The patient denies bowel or bladder dysfunction. Denies numbness or paresthesias in the lower extremities.  Denies progressive weakness.  I review health and history form and 14 point review of systems.  PAST MEDICAL HISTORY:  Coronary artery disease, status post myocardial infarction.  PAST SURGICAL HISTORY: 1. Coronary artery bypass graft in 2002. 2. Abdominal aortic aneurysm repair in 1999. 3. Cervical spine surgery in 1999. 4. Shoulder surgery for rotator cuff repair in 2002. 5. Knee surgery in 1999.  FAMILY HISTORY:  Heart disease, cancer.  SOCIAL HISTORY:  Denies smoking, admits to occasional alcohol use.  He is married, and is retired.  ALLERGIES:  CODEINE.  MEDICATIONS: 1. Lopressor. 2. Oxycodone. 3. Aspirin. 4. Temazepam.  PHYSICAL EXAMINATION:  GENERAL:  A healthy male in no acute distress.  VITAL SIGNS:  Blood pressure 143/76, pulse 58, respiratory rate 12, O2 saturations 99% on room air.  BACK:  Level pelvis with decreased lumbar lordosis.  There is no significant scoliosis noted.  Flexion is limited secondary to pain in his low back radiating into his right lower extremity.  Extension is full without much discomfort.  Extension plus rotation to the  right causes reproduction of the patients pain.  Manual muscle testing is 5/5 bilateral lower extremities. Sensory examination is intact to light touch bilateral lower extremities. Muscle stretch reflexes are 2+/4 bilateral patellar, medial hamstrings, and Achilles.  Palpatory examination reveals mild tenderness to palpation over  the lumbar paraspinals on the right with moderate tenderness over the right sacroiliac joint region.  Pearlean Brownie is negative bilaterally.  Straight leg raise is positive on the right at 50 degrees, negative on the left.  Tight hamstrings bilaterally.  Tight hip flexors bilaterally.  Gaenslen is negative on the right.  Distal pulses are present bilaterally.  No heat, erythema, or atrophy in the lower extremities bilaterally.  No edema noted.  LABORATORY DATA:  MRI of the lumbar spine:  Films were reviewed.  IMPRESSION: 1. Degenerative disc disease of the lumbar spine without myelopathy.  The    patient has radicular symptoms in an L4 and possibly L5 distribution with    noted broad based disk protrusion at L4 with central stenosis, as well as    lateral recess stenosis at L4-5 and L5-S1.  The patients symptoms are not    entirely consistent with neurogenic claudication, however.  I suspect nerve    root irritation at L4 as the predominant etiology of the patients current    symptoms. 2. Spinal stenosis of the lumbar spine without myelopathy. 3. Facet arthropathy.  Cannot rule out contributory component of facet    syndrome.  PLAN: 1. Discussed treatment options with Mr. Smoak, and I believe it is    reasonable to proceed with a trial of lumbar epidural steroid injections.    These were explained to the patient in detail, including risks, benefits,    limitations, and alternatives.  Questions answered.  Informed consent was    obtained.  The patient was brought back to the fluoroscopy suite and placed    on the table in prone position.  Skin was prepped and draped in usual    sterile fashion.  Skin and subcutaneous tissue was anesthetized with 3 cc    of 1% preservative-free lidocaine.  Under direct fluoroscopic guidance, a    20 gauge, 3.5 inch Tuohy needle was advanced into the right paramedian    L5-S1 epidural space with loss of resistance technique.  No CSF, heme, or      paresthesias were noted.  This was injected with 1 cc of Depo-Medrol 80 mg    per cc preservative-free, plus 1 cc of normal saline with needle flush.  No    complications.  The patient tolerated the procedure well.  Discharge    instructions given. 2. Lortab 7.5/500 mg one p.o. q.i.d. p.r.n. pain. 3. We will consider the addition of Neurontin based on the patients response    to epidural steroids. 4. The patient is to return to clinic in 7 to 10 days for re-evaluation and    possible second lumbar epidural steroid injection.  We will consider    transforaminal approach to the epidural steroids if the patients symptoms    continue to lateralize to the right.  The patient was educated on the above findings and recommendations and understands.  There were no barriers to communication.Dictated by:   Sondra Come, D.O. Attending Physician:  Sondra Come DD:  06/16/01 TD:  06/16/01 Job: [id number]

## 2010-10-20 NOTE — Consult Note (Signed)
[facility]  Patient:    Peter Richards, Peter Richards Visit Number: [id number] MRN: [id number]          Service Type: PMG Location: [facility] Attending Physician:  Sondra Come Dictated by:   Sondra Come, D.O. Proc. Date: 10/02/01 Admit Date:  09/30/2001   CC:         [name], M.D.  Mena Goes. Franky Macho, M.D.   Consultation Report  HISTORY OF PRESENT ILLNESS:  Peter Richards returns to clinic today for reevaluation.  He was last seen on August 07, 2001.  Peter Richards has multifactorial spinal stenosis with lateral recess stenosis which has contributed to right lower extremity radicular pain in an L5 distribution.  He got considerable relief following two right L5-S1 transforaminal epidural steroid injections.  The last one was performed on July 11, 2001.  He has been evaluated by Dr. Franky Macho, Neurosurgeon, who wanted to put off surgery as the patient was getting some relief with the epidural shots.  Peter Richards returns today complaining of recurrence of his pain into his right lower extremity.  His pain today is a 9/10 on a subjective scale still involving the right buttock and lateral leg.  He states he started aquatic therapy and thinks maybe that aggravated his symptoms to some degree.  In the meantime, he has discontinued Bextra secondary to upset stomach.  He currently is taking oxycodone/Tylenol typically one time per day as prescribed by his Primary Care Yohance Hathorne, Dr. Tresa Endo.  He also takes Tylenol PM for sleep.  He inquires about getting another injection and we discuss this at length.  I inform Peter Richards that repeating an injection may give him some symptomatic relief of his discomfort but the duration of the relief is uncertain and would not do anything to change the anatomy which is contributing to his symptoms.  We discussed also returning to see Dr. Franky Macho and he states he has a followup appointment set for September.  Mr.  Richards wants to avoid surgical intervention if possible and would like to proceed with repeat injection.  I review the health and history form, and 14-point review of systems.  The patients function and quality of life indices have declined further since his pain has worsened.  His sleep is fair.  PHYSICAL EXAMINATION:  GENERAL:  Healthy male in no acute distress.  VITAL SIGNS:  Blood pressure 130/76, pulse 47, respirations 16, O2 saturation is 97% on room air.  BACK:  Minimal tenderness to palpation bilateral lumbar paraspinals.  NEUROLOGIC:  Manual muscle testing reveals 4/5 strength left hip flexor and left extensor hallucis longus, otherwise 5/5 strength bilateral lower extremities.  Sensory exam is intact to light touch bilateral lower extremities with the exception of right medial leg which correlates with patients old vascular surgical incision.  Muscle stretch reflexes are 2+/4 bilateral patellar, medial hamstrings, and Achilles.  MRI OF THE LUMBAR SPINE:  This was reviewed revealing central canal stenosis at L4-5 and L5-S1 with left greater than right lateral recess and neural foraminal stenosis.  IMPRESSION: 1. Spinal stenosis of the lumbar spine, centrally and lateral recess with    degenerative disk disease of the lumbar spine, facet arthropathy.  This    results in right lower extremity radicular symptoms in an L5 distribution.    The patient also is noted today to have some mild weakness in his left hip    flexor and right extensor hallucis longus, which may be secondary to the    stenosis  noted.  I did not notice this on previous examination and may be    indicative of progressive neurologic deficit. 2. Nonrestorative sleep disorder stable.  PLAN: 1. I had a long discussion with Peter Richards regarding treatment options.    At this point, I think it is appropriate to proceed with a repeat L5-S1    transforaminal epidural steroid injection for symptomatic  relief.  I did    explain to Peter Richards that this would likely only give him temporary    relief and he understands but wishes to proceed with the injection. 2. I instructed Peter Richards to followup with Dr. Franky Macho for possible    surgical intervention. 3. The patient is to continue current medications. 4. Continue to follow and monitor patients progress. 5. The patient is to return to clinic tomorrow for repeat injection.  The patient was educated in the above findings and recommendations, and understands.  There were no barriers to communication. Dictated by:   Sondra Come, D.O. Attending Physician:  Sondra Come DD:  10/02/01 TD:  10/04/01 Job: [phone]

## 2010-10-20 NOTE — Consult Note (Signed)
Peter Richards  Patient:    Peter, Richards Visit Number: [id number] MRN: [id number]          Service Type: PMG Location: [facility] Attending Physician:  Sondra Come Dictated by:   Sondra Come, D.O. Proc. Date: 08/07/01 Admit Date:  06/12/2001   CC:         Peter Richards, M.D.   Consultation Report  [name] returns to clinic today as scheduled for reevaluation.  He was last seen on July 11, 2001, at which time he underwent a second right L5-S1 transforaminal epidural steroid injection for right L5 distribution radicular symptoms.  [name] states that his symptoms are remarkably improved. He states that he does get some intermittent pain with prolonged walking and sitting but overall his symptoms are very manageable at this point.  He has discontinued his hydrocodone.  He has been started on Bextra 20 mg daily and takes Tylenol as well as needed.  His function and quality of life indexes have significantly improved.  His sleep is good.  I review the health and history form and 14 point review of systems.  No new neurologic complaints. Denies bowel or bladder dysfunction.  Patients pain level today is a 3/10 on a subjective scale.  PHYSICAL EXAMINATION  GENERAL:  Healthy male in no acute distress.  VITAL SIGNS:  Blood pressure 112/63, pulse 50, respirations 12, O2 saturation 98% on room air.  NEUROLOGIC:  Manual muscle testing 5/5 bilateral lower extremities.  Sensory examination is intact to light touch bilateral lower extremities.  Muscle stretch reflexes are symmetric bilateral lower extremities.  IMPRESSION: 1. Right L5 distribution radiculitis/radiculopathy secondary to multifactorial    spinal stenosis and lateral recess stenosis with degenerative disk disease    of the lumbar spine, symptoms markedly improved. 2. Facet arthropathy of the lumbar spine.  PLAN: 1. Continue Bextra 20 mg daily for now. 2. Continue  Tylenol as needed for pain. 3. Continue home-based exercise program and walking program. 4. Patient to return to clinic in three months for reevaluation or sooner as    needed.  The patient was educated on the above findings and recommendations and understands.  There were no barriers to communication. Dictated by:   Sondra Come, D.O. Attending Physician:  Sondra Come DD:  08/07/01 TD:  08/08/01 Job: [id number]

## 2010-10-20 NOTE — H&P (Signed)
[place]  Patient:    Peter Richards, Peter Richards                   MRN: [id number] Adm. Date:  [id number] Attending:  Eleanora Neighbor CC:         Modesta Messing, M.D.   History and Physical  REASON FOR ADMISSION:  Cardiac catheterization.  HISTORY OF PRESENT ILLNESS:  Mr. Guzzetta is a 77 year old male who was admitted for cardiac catheterization.  On June 7, he had an episode of substernal chest discomfort in his left lower anterior chest, that did not radiate to his arm, back, or neck.  It was not associated with an diaphoresis or shortness of breath.  The following day, he had a similar episode lasting about five minutes.  He had his last nuclear stress test on September 04, 1999, which showed evidence of a fixed defect, but no reversible ischemia.  He was subsequently referred for an Adenosine Cardiolite on November 27, 2000, which showed presence of a small apical scar and reversible anterior apical ischemia and question transient dilatation.  He had an ejection fraction of 53%. Because of that, he is referred for cardiac catheterization.  He had a history of acute anterior infarction in 1982 and had catheterization with a second catheterization in 1988 which showed a mid LAD lesion of approximately 70% and right coronary artery lesion of 75%.  He has done reasonably well since that time until recently.  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  ALLERGIES:  CODEINE causes nausea.  MEDICATIONS: 1. Baby aspirin one a day. 2. Cardizem CD 120 mg per day. 3. Tylenol PM at night.  REVIEW OF SYSTEMS:  No history of stroke or TIAs, hypertension, diabetes, or heart failure.  SOCIAL HISTORY:  He is a nonsmoker.  He drinks alcohol  occasionally.  REVIEW OF SYSTEMS:  He had a complete examination by Dr. Prentiss Bells on August 14, 2000.  He was found to have a trace of blood and subsequent colonoscopy which was negative.  He does have a remote history of diverticulosis.  PHYSICAL EXAMINATION:  VITAL SIGNS:  Weight 200, blood pressure 130/90 sitting, 130/90 standing, pulse 68 and regular.  HEENT:  Pupils are equal, round and reactive to light.  No jugular venous distension.  Carotids show no bruits.  Thyroid is not enlarged.  CHEST:  Basically clear.  HEART:  Grade 2/6 systolic ejection murmur at the base radiating to the neck. No gallop.  No chest wall tenderness.  ABDOMEN:  Soft.  Midline scar is well-healed.  EXTREMITIES:  Without edema.  There are adequate pedal pulses.  EKG shows normal sinus rhythm with an old anteroseptal myocardial infarction.  IMPRESSION: 1. Long history of coronary artery disease. 2. Status post aneurysm resection. 3. History of cerebrovascular disease.  PLAN:  We will proceed on with cardiac catheterization, coronary artery arteriograms.  The procedure, risks, and benefits have been explained.  Risks included heart attack, stroke, heart stoppage, death, allergy, and blind bleeding have all been explained and the patient is willing to proceed. DD:  12/04/00 TD:  12/04/00 Job: [id number]

## 2010-10-20 NOTE — Op Note (Signed)
NAME:  Peter Richards, Peter Richards NO.:  [id number]   MEDICAL RECORD NO.:  [id number]          PATIENT TYPE:  INP   LOCATION:  1429                         FACILITY:  [facility]   PHYSICIAN:  Valetta Fuller, M.D.  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   POSTOPERATIVE DIAGNOSIS:  Invasive transitional cell carcinoma in the  urinary bladder.   PROCEDURE:  1.  Radical cystoprostatectomy with creation of ileal conduit urinary      diversion.  2.  Bilateral pelvic lymph node dissection.   SURGEON:  Barron Alvine, M.D.   ASSISTANTS:  1.  Nancie Neas.  2.  Glade Nurse, M.D.   ANESTHESIA:  General endotracheal.   SPECIMENS:  1.  Bladder, prostate, seminal vesicles.  2.  Bilateral distal ureteral margins.  3.  Urethral margin.  4.  Bilateral pelvic lymph node dissections.   DRAINS:  1.  10-French Blake drain in the anatomic pelvis.  2.  18-French Foley catheter urethral drain.   DESCRIPTION OF PROCEDURE:  The patient was identified by his wrist bracelet  and brought to room 11 where he received preoperative antibiotics and was  administered general anesthesia.  He was prepped and draped in the usual  sterile fashion taking great care to minimize the risk of peripheral  neuropathy and compartment syndrome.  Next, the Foley catheter was placed to  the level of his urinary bladder and balloon was inflated.  Following this,  we made a low midline incision from his pubis symphysis to the level of the  umbilicus.  We dissected down through the dermis, subcutaneous fascia,  Scarpa's fascia to the anterior sheath with Bovie cautery.  Hemostasis was  maintained throughout.  Next, the anterior sheath was split along the length  of the incision using Bovie cautery.  Following this, we bluntly  developed  the space of Retzius down.  Following this, Omni-Tract retracting  system  was set to retract the abdominal wall.  Following this, we identified the  posterior sheath grasped with forceps.  We picked up on it and divided it  sharply.  We then identified the peritoneum and divided sharply entering the  peritoneal space under direct visualization.  Next, the serratus was  identified, grasped and was divided and the proximal aspect of it was  secured with a Kelly clamp.  Next, using Bovie cautery, we created an  inverted V from the Kelly clamp down to the lateral pelvic sidewalls  bilaterally.  In the course of this, we encountered the bilateral vas  deferens, which were ligated with Weck clips and divided.  Following this,  we scored the posterior peritoneum from the distal aspect of our inverted V  incision on the peritoneum over the posterior surface of bladder and rectum.  This was done with Bovie cautery.  Following this, we palpated the  bifurcation of the aorta and the iliac vessels and opened the peritoneum  medially over the pelvic  brim medially.  We were able to immediately  identify the right ureter which was delivered in the field on the vascular  stay.  Next, with a combination of sharp and blunt dissection, we freed the  ureter along its course to the level of the urinary bladder.  We were  careful to maintain surrounding tissue to preserve blood supply.  Next,  distally on the right ureter we ligated it with a Weck clip, proximally and  distally and divided it.  We then sent a distal ureteral margin for frozen  analysis for CIS.  This returned with possible CIS.  We then repeated a  second distal ureteral margin which returned free of CIS or urothelial  carcinoma.  Next, this process was repeated on the left.  We opened the  posterior perineum over the bifurcation of the pelvic brim.  The ureter was  identified and delivered out of the field.  Next, using a  combination of  sharp and blunt dissection, we freed the ureter along its course from the  level of the UVJ to just proximal to the pelvic brim.  Again, we were  careful to maintain surrounding tissue to preserve blood supply.  Again the  distal ureter was divided just proximal to the level of UPJ with a Weck  clip, proximally and distally and divided sharply.  A distal left ureteral  margin was sent.  This returned possible CIS versus a high-grade dysplasia.  A second more proximal ureteral margin was sent which returned without  evidence of urothelial carcinoma, CIS or dysplasia.  Following this, we  bluntly developed the space between the rectum and the posterior aspect of  the bladder over our previously scored posterior peritoneum.  We were able  to easily develop the space to the level of the prostate.  Next using the  LigaSure device, we ligated and divided the left and right bladder pedicles  to the level of the lateral prostatic fascia.  Following this, we incised  our puboprostatic fascia bilaterally and sharply took down the puboprostatic  ligaments.  Using the surgeon's first finger, we  bluntly developed a space  between the dorsal venous complex and the anterior urethra by using  Hoenfelter.  We passed the Hoenfelter between the dorsal venous complex and  the  anterior urethra and brought a 2-0 Vicryl through a pass which was  secured.  This was repeated with the second more distal 2-0 Vicryl.  Following this, the dorsal venous complex was divided with Bovie cautery  proximal to these ligations.  Several venous points were addressed using a  combination of 2-0 suture ligatures and Bovie cautery.  Following this, the  anterior urethra was visible which was divided with Bovie cautery.  We were  then able to visualize the Foley catheter which was delivered into the  field.  The valve stem was divided, and the entire Foley catheter was brought into the field. We then passed a  Hoenfelter retractor between the  posterior urethra and the rectum which was then divided sharply.  Following  this, we divided our prostatic pedicles in a retrograde fashion using Weck  clips and sharp division.  This freed the entire prostate seminal vesicle on  bladder specimen which was passed off the field for analysis.  Next, we  turned our attention back to the urethral stump.  A margin was sent which  was returned negative for dysplasia, CIS or urothelial carcinoma.  Following  this, we addressed several bleeding points  within the anatomic pelvis with  Bovie cautery.  Following this, we reset our retractors to provide exposure  for a left pelvic lymph node dissection.  We identified the external iliac  and using the sterile technique, we divided the tissue superior to the  artery and dissected down from the bifurcation to the node of [place].  We  then keeping this packet intact, we divided the attachments over the  external iliac vein and dissected bluntly back to the left pelvic sidewall.  The obturator nerve was identified and kept in plain view throughout this  dissection to avoid injury to it.  We divided attachments to this lymph node  packet with Weck clips and then divided sharply.  The pack was then passed  off the field for pathologic analysis.  his process was then repeated on the  right again using sterile technique from the iliac bifurcation to Cooper's  ligament to the pelvic sidewall below the external veins,keeping the  obturator and nerve in place throughout their dissection, ligating and  dividing with Weck clips and sharp dissection. Next, we reinspected the  anatomic pelvis.  Hemostasis was noted to be excellent. We then reset our  retractors to proceed with the ileal conduit.  The ileocecal valve was  noted.  We measured approximately 15 cm proximal to this and measured a  suitable piece of ileum, that was approximately 15 cm long.  A suitable  vascular arcade  was noted that will provide excellent perfusion to our  conduit.  Next, the mesentery was divided distally approximately 8 cm deep  on the mesentery.  We ligated and divided up to a level about the LigaSure  device. The mesenteric the level of bowel was cleaned.  This process was  then repeated on the proximal side of our mesentery supply, however, in this  aspect we e only divided approximately 3 cm deep into the mesentery.  Next,  using the GIA stapling device, the bowel was divided at each of these sites.  Next, the portion used as a conduit was passed under the segments to be  reanastomosed.  Next, we aligned the antimesenteric borders on the proximal  and distal aspect of the ileum that were to be put back in continuity.  The  antimesenteric corners were removed with heavy scissor.  Next, using a GIA  stapling device we fired it across the antimesenteric borders, which were  all lined.  The interior of the anastomosis was examined and found to be hemostatic.  Following this, we fired a GIA stapling device across the open  end of the anastomosis and removed the excess tissue with a sharp knife.  The rest of the anastomosis was over sewn with interrupted vertical mattress  sutures consisting of 3-0 silk.  Next, the traps in the mesentery were  closed with interrupted 3-0 silk sutures.  Following this, we examined her  ileal conduit which continued to be well perfused.  We removed the distal  staple line, and the conduit was flushed with normal saline.  Next, we  turned attention to the left ureter. We created a subsigmoid tunnel and the  left ureter was brought through a pass.  Next, the distal Weck clip was  removed and the ureter was spatulated.  Next, we placed a stay suture at the  apex of the spatulation.  We then created an ileostomy at the dependent  portion of the ileal conduit, sharply and the stay suture was placed in an  inside in  and outside out fashion at the 6 o'clock  position.  Next, we  brought our single J stent through the distal aspect of the conduit and  fired it up the ureter to a resistance was met.  The wire was removed.  The  stent was secured into place with in interrupted 4-0 chromic at the middle  of the stitch, neck of the wire was removed the stent was secured in place  with interrupted 4-0 chromic through the middle of the stent at the level of  the distal ureter. Next, a second 4-0 Monocryl was placed at the 12 o'clock  position in an interrupted fashion between the ileum and the ureter.  Next,  the anastomosis was completed with a vascular type anastomosis running each  half of the anastomosis with the previously placed 4-0 Vicryl at the 6 and  12 o'clock position.  We then irrigated through the distal aspect of the  conduit and noted the anastomosis to be watertight.  This process was then  repeated with a right ureter.  We removed the Weck clip.  The ureter was  spatulated.  A stitch was placed at the apex of the spatulation was switched  to the 6 o'clock position.  The ileostomy was created sharply.  The holding  stitch was placed at the 6 o'clock position of the ileostomy in an inside-  outside fashion and secured.  A stent was placed through the conduit up the  right ureter.  The wire was removed. It was fixed to the stent with a single  interrupted 5-0 chromic at the distal ureter through the middle of the  stents.  Next a second 4-0 Vicryl was placed at 12 o'clock position between  the ileum and the ureter and next the anastomosis. Next, the __________  anastomosis was completed in a vascular type fashion running each 4-0 Vicryl  from the 6 to the 12 o'clock position.  Next, the anastomosis was checked by  irrigating the conduit and it was found to be watertight. Following this, we  brought our distal aspect of our conduit to the level by the previously been  marked by the stomal service.  It reached adequately.  We scored  a circumferential diameter of skin approximately 1 cm in diameter. We  dissected down through the anterior sheath.  A cruciate incision was made  anterior sheath with Bovie cautery.  Using surgeon's first two fingers we  made sure that the fascial defect was wide enough to accommodate the stoma  without stomal stenosis.  We then brought the stoma out throughout a path.  The stoma was affixed in two layers, the first layer connecting.  We sewed  the fascia to a level approximately 2-3 cm proximal to the distal aspect of  the conduit.  This was secured in four point fashion at 9, 12. 3 and 6  o'clock.  We then matured the stoma with interrupted 4-0 Vicryl sewing skin  edge just proximal to the cut end of the ileum to the distal aspect of the  ileal mucosa.  This matured and this a history of stomal nicely.  Next we  placed a 0.25 cm incision in the left lower quadrant using a Vanderbilt.  We  brought the [place] drain through a pass.  We then placed the Foley catheter  in the anatomic pelvis and connected it to straight drain.  Following this,  we closed the fascia with a running #1 PDS, and the skin was closed with  staples.  Drains were both attached to drainage bags.  The patient was  reversed from his anesthesia which he tolerated without complication. Please  note Dr. Barron Alvine was present and participated in all aspects of the  case.     ______________________________  Glade Nurse, MD      Valetta Fuller, M.D.  Electronically Signed   MT/MEDQ  D:  04/30/2005  T:  05/01/2005  Job:  [id number]

## 2010-10-20 NOTE — Op Note (Signed)
NAME:  Peter Richards, Peter Richards                      ACCOUNT NO.:  [id number]   MEDICAL RECORD NO.:  [id number]                   PATIENT TYPE:  AMB   LOCATION:  DAY                                  FACILITY:  [facility]   PHYSICIAN:  Valetta Fuller, M.D.               DATE OF BIRTH:  [birth year]   DATE OF PROCEDURE:  DATE OF DISCHARGE:                                 OPERATIVE REPORT   PREOPERATIVE DIAGNOSIS:  Hematuria.   POSTOPERATIVE DIAGNOSIS:  Hematuria with multiple erythematous areas  throughout the bladder.   PROCEDURE:  Cystoscopy.  Right retrograde pyelogram.  Bladder biopsies with  fulguration.  Saline bladder wash.   SURGEON:  Valetta Fuller, M.D.   FIRST ASSISTANT:  Susanne Borders, M.D.   ANESTHESIA:  General endotracheal.   SPECIMENS:  Bladder biopsies x2 for pathological analysis.   COMPLICATIONS:  None.   DRAINS:  None.   DISPOSITION:  To post anesthesia care unit in stable condition.   INDICATIONS:  [name] is a 77 year old male with a history of  irritative voiding symptoms and hematuria.  Patient has been seen by Dr.  Earlene Plater twice in the past and has been started on Flomax with continuation of  these symptoms.  He recently underwent cystoscopy by Dr. Isabel Caprice and was  found to have a significant amount of blood and clots within his bladder.  There appeared to be some heaped-up erythematous mucosa on the right lateral  bladder wall, but this is very difficult to discern secondary to all of the  clots in this patient's bladder.  Because of the difficult cystoscopy at  that time, it was recommended that the patient undergo formal cystoscopy in  the operating room with possible retrograde pyelogram and possible resection  of tumor, depending on the finding.  The patient consented to this after  understanding the risks, benefits and alternatives.   DESCRIPTION OF PROCEDURE:  Patient was brought to the operating room and  correctly identified by the identification  bracelet.  He is given  preoperative antibiotics and general endotracheal anesthesia.  He is placed  in a dorsal lithotomy position.  His genitalia were prepped and draped in  the typical sterile fashion.  Cystoscopy was performed with a 22 French  cystoscope, sheath, and 12 degree lens.  The patient's anterior urethra was  within normal limits.  The patient's posterior urethra demonstrated normal  verumontanum with prominent median lobe hyperplasia.  There were also  significant lateral lobes of his prostate.  Within the patient's bladder,  there was no obvious bladder tumor noted; however, there were multiple areas  of heaped-up erythematous mucosa.  One of these areas was just medial to the  right ureteral orifice.  There was another prominent erythematous area in  the central floor of the bladder just cephalad to the trigone.  There was  also an area on the right lateral wall of the bladder.  The rest of  the  bladder mucosa also appeared somewhat erythematous and injected.  The left  ureteral orifice was identified in its normal anatomical location and was  not abnormal in appearance.  The right ureteral orifice was cannulated with  an acorn tip urethral catheter.  Approximately 10 cc of cysto Conray was  injected.  The patient was found to have a normal course of his right ureter  and collecting system.  There was no hydroureteral nephrosis, and there was  no filling defect noted.  The cold-cut biopsy forceps were then used to take  two biopsies of the area near the right ureteral orifice and central area  into the trigone in the floor of the bladder.  These areas were then  carefully fulgurated with a Bugbee electrode.  Saline barbotage was then  performed after the bladder was drained.  The biopsied areas were then  carefully inspected, and there was no bleeding noted with the irrigation  off.  The cystoscope was removed.  Viscous lidocaine 10 cc was then injected  into the patient's  urethra.  The patient was awakened from his anesthesia  without complications, having tolerated the procedure well.  Transfer to  post anesthesia care unit in stable condition.   Dr. Isabel Caprice was present and participated in all aspects of the case, as he is  the primary surgeon.     Susanne Borders, MD                           Valetta Fuller, M.D.    DR/MEDQ  D:  07/21/2003  T:  07/21/2003  Job:  [id number]

## 2010-10-20 NOTE — Consult Note (Signed)
[facility]  Patient:    Peter Richards, Peter Richards Visit Number: [id number] MRN: [id number]          Service Type: PMG Location: TPC Attending Physician:  Sondra Come Dictated by:   Sondra Come, D.O. Proc. Date: 10/16/01 Admit Date:  09/30/2001   CC:         Peter Richards, M.D.  Peter Richards. Franky Macho, M.D.   Consultation Report  [name] returns to clinic today as scheduled for reevaluation.  He was last seen on Oct 03, 2001 at which time he underwent a right L5-S1 transforaminal epidural steroid injection for right L5 distribution radiculitis/radiculopathy secondary to multifactorial spinal stenosis and lateral recess stenosis.  Patient states that he has had nearly complete resolution of his right lower extremity radicular pain and has some mild occasional low back pain.  His pain today is a 2/10 on a subjective scale. His function and quality of life indexes have improved.  His sleep is fair.  I review health and history form and 14 point review of systems.  Patient denies any bowel and bladder dysfunction.  Denies progressive weakness in the lower extremities.  PHYSICAL EXAMINATION  GENERAL:  Healthy male in no acute distress.  VITAL SIGNS:  Blood pressure 140/65, pulse 54, respirations 16, O2 saturation 97% on room air.  NEUROLOGIC:  Manual muscle testing reveals 4+/5 strength in the left hip flexor, otherwise 5/5 bilateral lower extremities.  Left hip flexor is slightly stronger than last visit.  Sensory examination is intact to light touch bilateral lower extremities.  Muscle stretch reflexes are 2+/4 bilateral patellar, medial hamstrings, and Achilles.  IMPRESSION:  Spinal stenosis of the lumbar spine with lateral recess stenosis and nearly resolved right lower extremity radicular pain.  Patient does still have some very mild weakness in his left hip flexor which seems to be modestly improved from last visit.  PLAN: 1. Continue  home exercise program. 2. Continue to monitor symptoms.  I have asked him to follow up with me as    needed if his pain returns.  In addition, I have asked him to follow up    with me if he feels like his lower extremity weakness is worsening.  In    such a case we would need to get him back in to see Dr. Franky Macho for    possible surgical intervention.  Patient was educated on the above findings and recommendations and understands.  There were no barriers to communication. Dictated by:   Sondra Come, D.O. Attending Physician:  Sondra Come DD:  10/16/01 TD:  10/19/01 Job: [id number]

## 2010-10-20 NOTE — Consult Note (Signed)
[facility]  Patient:    Peter Richards, Peter Richards Visit Number: [id number] MRN: [id number]          Service Type: PMG Location: TPC Attending Physician:  Sondra Come Dictated by:   Sondra Come, M.D. Proc. Date: 07/11/01 Admit Date:  06/12/2001   CC:         [name], M.D.   Consultation Report  HISTORY OF PRESENT ILLNESS:  Mr. Torrez returns to clinic today as scheduled for reevaluation.  He was last seen on June 27, 2001, at which time I performed a right L5-S1 transforaminal epidural steroid injection.  Mr. Papadakis states that he has had significant relief of his pain in his low back and right lower extremity.  His symptoms have decreased from a 10/10 on a subjective pain scale to a 6/10.  He has not had to take any Lortab but instead takes Tylenol as needed for pain.  His function and quality of life indices have improved somewhat and his sleep is also somewhat improved.  I review health and history form, and 14-point review of systems.  No new neurologic complaints.  The patient denies bowel and bladder dysfunction.  The patient denies progressive neurologic deficit in the lower extremities.  PHYSICAL EXAMINATION:  GENERAL:  The patient is a healthy male in no acute distress.  VITAL SIGNS:  Blood pressure is 133/76, pulse 55, respirations 20, O2 saturation is 99% on room air.  NEUROLOGIC:  No new neurologic changes in the lower extremities including motor, sensory and reflexes.  IMPRESSION: 1. Persistent right L5 distribution radiculitis/radiculopathy secondary to    multifactorial spinal stenosis and lateral recess stenosis with    degenerative disk disease of the lumbar spine. 2. Facet arthropathy lumbar spine.  PLAN:  Will proceed with a repeat right L5-S1 transforaminal epidural steroid injection.  Rationale for this is improved symptoms following last injection without complete resolution.  This was described to  the patient.  The procedure was also described to the patient in detail again.  The patient gives informed consent.  DESCRIPTION OF PROCEDURE:  The patient was brought back to the fluoroscopy suite and placed on the table in prone position.  The skin was prepped and draped in the usual sterile fashion.  The skin and subcutaneous tissue was anesthetized with 3 cc of 1% lidocaine.  Under direct fluoroscopic guidance, a 22-gauge 3-1/2 inch spinal needle was advanced into the right L5-S1 neural foramen under oblique and AP views.  Needle tip placement was confirmed under AP view to be just lateral to the 6 oclock position on the L5 pedicle. Further confirmation was obtained after negative aspiration with the injection of 1 cc of Omnipaque 180 revealing a right L5 epiradiculogram.  No vascular uptake noted.  This was then followed by injection of 1 cc of Kenalog 40 mg/cc plus 1 cc of preservative free 1% lidocaine with needle flush.  No complications noted and the patient tolerated the procedure well.  Discharge instructions were given.  RECOMMENDATIONS: 1. The patient is to continue with Tylenol as needed for pain.  The patient    has Lortab if needed for severe pain. 2. The patient is to return to clinic in one month for reevaluation. 3. The patient is scheduled for neurosurgical consultation regarding surgical    opinion.  The patient was educated on the above findings and recommendations, and understands.  There were no barriers to communication.  The patient was instructed to maintain contact with Dr.  Younkin. Dictated by:   Sondra Come, M.D. Attending Physician:  Sondra Come DD:  07/11/01 TD:  07/12/01 Job: [id number] [id number]

## 2010-10-20 NOTE — Consult Note (Signed)
NAME:  NAVY, BELAY NO.:  [id number]   MEDICAL RECORD NO.:  [id number]          PATIENT TYPE:  OIB   LOCATION:  2899                         FACILITY:  [facility]   PHYSICIAN:  Cassell Clement, M.D. DATE OF BIRTH:  April 12, 1929   DATE OF CONSULTATION:  10/26/2005  DATE OF DISCHARGE:                                   CONSULTATION   PROCEDURE:  DC Cardioversion.   HISTORY OF PRESENT ILLNESS:  This 77 year old gentleman was recently found  to be in atrial fibrillation. He has been on Coumadin for a month and is now  brought in for elective cardioversion.   DESCRIPTION OF PROCEDURE:  The patient was given 250 mg of IV Pentothal by  anesthesiologist, Dr. Jean Rosenthal, following which he was given shock of joules  with failure to convert. He was given another shock with failure to convert  and a third shock of 150 joules, which resulted in normal sinus rhythm. The  patient tolerated the procedure well and there were no immediate neurologic  complications.           ______________________________  Cassell Clement, M.D.     TB/MEDQ  D:  10/26/2005  T:  10/27/2005  Job:  [id number]   cc:   Hal T. Stoneking, M.D.  Fax: [phone]

## 2010-10-20 NOTE — Op Note (Signed)
[place]  Patient:    CELIA, GIBBONS Visit Number: [id number] MRN: [id number]          Service Type: DSU Location: [facility] Attending Physician:  Katha Cabal Dictated by:   Thornton Park Daphine Deutscher, M.D. Proc. Date: 05/29/01 Admit Date:  05/29/2001   CC:         [name], MD   Operative Report  DATE OF BIRTH:  December 13, 1928.  CCS# [id number]  PREOPERATIVE DIAGNOSIS:  Posterior anal fissure, failure to heal with nitroglycerin therapy.  POSTOPERATIVE DIAGNOSIS: 1. Posterior fissure. 2. Internal hemorrhoids. 3. Symptomatic skin tags.  OPERATION PERFORMED: 1. Rigid sigmoidoscopic exam (aborted because of equipment failure). 2. Left lateral internal sphincterotomy. 3. Banding of posterior hemorrhoidal column. 4. Excision of sentinel skin tag from fissure. 5. Excision of anterior skin tag and also skin tag located about two    inches from the anus at the 5 oclock position.  SURGEON:  Thornton Park. Daphine Deutscher, M.D.  ANESTHESIA:  General by LMA.  DESCRIPTION OF PROCEDURE:  The patient was taken to room 2 and placed in dorsal lithotomy position.  First, I inserted the rigid sigmoidoscope from the main hospital central supply.  After multiple attempts, I realized that the O ring on this was nonfunctional.  In was able to get a nondistended view of the distal 10 cm of the rectum and on that exam was unable to see any tumors or growths.  The patient had had a preoperative barium enema exam which showed no gross lesion of the colon.  The patients anus and rectum were prepped.  I used Betadine up inside and cleaned him out good.  He did have some excoriation and chapping of the anus from a couple of skin tags that were quite prominent and he had difficulty cleansing himself.  I directed my attention to the sphincterotomy.  He had a very tight internal sphincter at the 3 oclock position with him in the dorsal lithotomy position. I made a  small incision with the 15 blade.  I went in and cut the distal centimeter of the internal sphincter not disturbing the external sphincter. There I felt the relaxation of the internal sphincter.  I went then and re-examined the fissure where the muscle fibers were visible.  Up in the anal canal above that was a real prominent internal hemorrhoid which I double banded.  Outside of that was a sentinel skin tag which I excised and used electrocautery to control bleeding.  At the 12 oclock position there was a long large skin tag which obviously was causing the most hygiene problem and I excised that and cauterized that.  There was one other little skin tag at about 5 oclock 2 or 3 cm away from the anal verge and I excised that.  It was less than 2 mm in diameter.  Betadine ointment was massaged into this region and a dressing was applied. The patient will be given Vicodin to take for pain and will be followed up in the office in about two weeks.  Instructions regarding postoperative management were given to his wife. Dictated by:   Thornton Park Daphine Deutscher, M.D. Attending Physician:  Katha Cabal DD:  05/29/01 TD:  05/29/01 Job: [id number]

## 2010-10-20 NOTE — Op Note (Signed)
[place]  Patient:    Peter Richards, Peter Richards Visit Number: [id number] MRN: [id number]          Service Type: DSU Location: [facility] Attending Physician:  Katha Cabal Dictated by:   Thornton Park Daphine Deutscher, M.D. Proc. Date: 12/15/01 Admit Date:  12/15/2001   CC:         [name], MD  Clovis Pu Patty Sermons, M.D.   Operative Report  CCS# [id number]  PREOPERATIVE DIAGNOSIS:  POSTOPERATIVE DIAGNOSIS:  OPERATION PERFORMED:  Rigid sigmoidoscopic exam to 15 cm, a left lateral internal sphincterotomy, biopsy of skin lesions at 8 oclock and 2 oclock, hemorrhoidal banding of the column at 12 oclock.  SURGEON:  Thornton Park. Daphine Deutscher, M.D.  ASSISTANT:  ANESTHESIA:  INDICATIONS FOR PROCEDURE:  Mr. Ezio Wieck underwent a internal sphincterotomy and hemorrhoidal column banding and excision of skin tags done on May 29, 2001.  He seemed to be getting along fine but then developed a lot of burning and bleeding after defecation, being seen back in the office on November 10, 2001.  We thought he might have some fungal infection and he was treated with a dermatologic antifungal agent without improvement.  He had some thickening there and the question is whether we needed to do more of a lateral internal sphincterotomy to cut down on his spasms since he was having a lot of pain post defection and did seem to have tenderness posteriorly with a little fissure.  He also had these skin changes.  DESCRIPTION OF PROCEDURE:  The patient was taken to room 8 at [facility] and given general.  His anus was prepped with Betadine outside and in.  I did a rigid sigmoidoscopic exam as I mentioned above and did not see any evidence of polyps or any ulcerated lesions.  The anus had this whitish thickened look and had some areas of breakdown with bleeding.  These areas were biopsied at 2 oclock and 8 oclock and full thickness biopsies were sent and  labeled respectively.  I performed a left lateral internal sphincterotomy, isolating the internal sphincter and nicking it and going about 1 cm in and improving on the relaxation that had been dealt there back in December of 2002.  Also complete anoscopy, he had a large hemorrhoidal column at the 12 oclock position which I had double banded with a suction bander.  The area of the sphincterotomy was injected with some Marcaine. Betadine ointment was massaged into the area.  He was awakened.  He will be given Vicodin to be taken for pain and will be followed up in the office in two to three weeks. Dictated by:   Thornton Park Daphine Deutscher, M.D. Attending Physician:  Katha Cabal DD:  12/15/01 TD:  12/16/01 Job: [phone] [id number]

## 2010-10-20 NOTE — Op Note (Signed)
NAME:  Peter Richards, Peter Richards NO.:  [id number]   MEDICAL RECORD NO.:  [id number]          PATIENT TYPE:  OIB   LOCATION:  1414                         FACILITY:  [facility]   PHYSICIAN:  Valetta Fuller, M.D.  DATE OF BIRTH:  05-25-1929   DATE OF PROCEDURE:  DATE OF DISCHARGE:                                 OPERATIVE REPORT   PREOPERATIVE DIAGNOSIS:  1.  Transitional cell carcinoma of the bladder.  2.  Bladder neck obstruction.   POSTOPERATIVE DIAGNOSIS:  1.  Transitional cell carcinoma of the bladder.  2.  Bladder neck obstruction.   PROCEDURE PERFORMED:  TURBT of bladder tumor with transurethral resection  and biopsy of bladder neck.   SURGEON:  Valetta Fuller, M.D.   ANESTHESIA:  General.   INDICATIONS:  [name] is a 77 year old male. He has had a complex  urologic history over the last 18-24 months. The patient had initially  presented with severe sudden onset of irritative voiding symptoms. 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 He responded promptly  to a course of steroids and Atarax. His voiding symptoms improved and he was  asymptomatic for a period of time. The patient subsequently had the sudden  onset of recurrent symptoms. Cytoscopically, he continued to have a  abnormality within his bladder. On repeat biopsy, however, a poorly  differentiated invasive transitional cell carcinoma was noted. It was  difficult to know whether this was at T1 or T1 lesion and therefore the  patient comes back today for more extensive biopsy and sampling of the  underlying muscularis. The patient has had some considerable irritative and  obstructive voiding symptoms and has had a TUIP in the past.   TECHNIQUE AND FINDINGS:  The patient was brought to the operating room where  he had successful induction of general anesthesia. He was placed in  lithotomy position, prepped  and draped in the usual manner. Sissy Hoff sounds  were used to dilate the urethra and bladder neck and then a 28-French  continuous flow resectoscope was inserted. The bladder abnormality was  primarily limited to the junction of the right lateral wall and posterior  wall. There an area of about 3 x 3 cm was involved with erythematous,  thickened and abnormal appearing mucosa. There was really not a straight  forward papillary component but rather what appeared to be an infiltrative  invasive and more poorly differentiated process. This whole area was  resected with underlying muscle and then the edges and bases were very  aggressively cauterized. No evidence of bladder perforation occurred. The  patient also had some somewhat abnormal mucosa right at the bladder neck.  There was also a little bit of a bladder neck contracture and for that  reason we did do several resections at the bladder neck at the 6 o'clock  position for biopsy sampling as well  as to open up the bladder neck. The patient appeared to tolerate the  procedure well. Urine was just a very light pink at the completion of the  procedure. We ended up placing a 24-French 30 mL  Foley catheter and he was  brought to the recovery room in stable condition.           ______________________________  Valetta Fuller, M.D.     DSG/MEDQ  D:  03/06/2005  T:  03/06/2005  Job:  [id number]

## 2010-10-20 NOTE — Consult Note (Signed)
[facility]  Patient:    Peter Richards, BURRIDGE Visit Number: [id number] MRN: [id number]          Service Type: PMG Location: [facility] Attending Physician:  Sondra Come Dictated by:   Sondra Come, D.O. Proc. Date: 10/03/01 Admit Date:  09/30/2001   CC:         [name], M.D.  Mena Goes. Franky Macho, M.D.   Consultation Report  HISTORY OF PRESENT ILLNESS:  Mr. Manas returns to clinic today as scheduled for a repeat right L5-S1 transforaminal epidural steroid injection. Mr. Ysaguirre has spinal stenosis of the lumbar spine with lateral recess stenosis contributing to recurrent right L5 distribution radicular symptoms. He has undergone two previous transforaminal lumbar epidural steroid injections in the past on June 27, 2001, as well as on July 11, 2001. In addition, he has undergone an interlaminar lumbar epidural steroid injection on June 16, 2001.  I review the health and history form, and 14-point review of systems.  I discuss the repeat injection with Mr. Dolberry.  Given his recurrent symptoms and desire to put off surgery, I think it is appropriate to proceed with a fourth injection as his first injection was approximately four months ago.  I discuss this with Mr. Nylund.  He wishes to proceed.  PHYSICAL EXAMINATION:  VITAL SIGNS:  Blood pressure 138/77, pulse 62, respirations 16, O2 saturation is 97% on room air.  IMPRESSION:  Right L5 distribution radiculitis/radiculopathy secondary to multifactorial spinal stenosis and lateral recess stenosis.  PLAN:  Repeat right L5-S1 transforaminal epidural steroid injection.  The procedure was described to the patient including risks, benefits, limitations, and alternatives.  The patient wishes to proceed.  Informed consent was obtained.  DESCRIPTION OF PROCEDURE:  The patient was brought back to the fluoroscopy suite and placed on the table in prone position.  A fluoroscopic  scout view reveals a partially lumbarized S1.  The skin was prepped and draped in the usual sterile fashion.  Skin and subcutaneous tissues were anesthetized with 3 cc of 1% lidocaine.  Under direct fluoroscopic guidance, a 22-gauge 3-1/2-inch spinal needle was advanced into the right L5-S1 neural foramen under oblique and AP views.  Needle tip placement was confirmed under AP view to be at the 6 oclock position on the L5 pedicle.  After negative aspiration, 1 cc of Omnipaque 180 was injected revealing a right L5 epiradiculogram under live fluoroscopy.  There was no vascular uptake noted.  This was then followed by injection of 1.5 cc of Kenalog 40 mg/cc plus 1.5 cc of preservative-free 1% lidocaine.  The patient tolerated the procedure well.  No complications. Discharge instructions given.  The patients post-injection pain score is 0/10.  RECOMMENDATIONS: 1. The patient is to continue Neurontin 100 mg at bedtime for now and increase    as directed as tolerated. 2. Continue oxycodone as needed. 3. The patient is to return to clinic in two weeks for reevaluation.  If    patient does not get any significant lasting relief with this injection,    would consider following up with Dr. Franky Macho for possible surgical    intervention.  The patient was educated on the above findings and recommendations, and understands.  There were no barriers to communication. Dictated by:   Sondra Come, D.O. Attending Physician:  Sondra Come DD:  10/03/01 TD:  10/05/01 Job: [id number]

## 2010-10-20 NOTE — Consult Note (Signed)
[facility]  Patient:    Peter Richards, Peter Richards Visit Number: [id number] MRN: [id number]          Service Type: PMG Location: [facility] Attending Physician:  Sondra Come Dictated by:   Sondra Come, M.D. Proc. Date: 06/27/01 Admit Date:  06/12/2001   CC:         [name], M.D.   Consultation Report  HISTORY OF PRESENT ILLNESS:  Mr. Barham returns to clinic today as scheduled for reevaluation.  He was initially seen on June 16, 2001, and underwent a lumbar epidural steroid injection.  The patient states that his low back pain has improved significantly, however he continues to complain of pain radiating into his right lower extremity.  He states that the pain that he previously had in the thigh has improved to some degree.  He still gets some discomfort in the lateral aspect of his thigh at this time.  He is mainly complaining of pain in the lateral aspect of his right leg which has not significantly improved.  His pain today is 10/10 on a subjective scale.  His function and quality of life has not significantly improved despite his mild symptom improvement.  His sleep remains poor.  He has taken a hydrocodone 7.5 mg without any relief of his pain.  I review health and history form, and 14-point review of systems.  PHYSICAL EXAMINATION:  GENERAL:  The patient is a healthy male in no acute distress.  VITAL SIGNS:  Blood pressure 116/80, pulse 61, respirations 20, O2 saturation is 98% on room air.  MUSCULOSKELETAL:  Manual muscle testing is 5/5 bilateral lower extremities. Sensory examination is intact to light touch bilateral lower extremities. Muscle stretch reflexes are 2+/4 bilateral patellar, medial hamstrings and Achilles.  There is mild tenderness to palpation bilateral lumbar paraspinals.  IMPRESSION: 1. Right L5 distribution radiculitis/radiculopathy secondary to multifactorial    spinal stenosis and lateral recess stenosis  with degenerative disk disease    of the lumbar spine. 2. Facet arthropathy lumbar spine.  PLAN: 1. I discussed further treatment options with Mr. Shere.  At this time,    given his lateralizing symptoms to his right lower extremity in an L5    distribution I recommend that we proceed with a right L5-S1 transforaminal    epidural steroid injection.  I discussed the procedure with the patient in    detail including risks, benefits, limitations, and alternatives.  The    patient wishes to proceed.  Informed consent was obtained.  The patient was    brought back to the fluoroscopy suite and placed on the table in the prone    position.  The skin was prepped and draped in the usual sterile fashion.    The skin and subcutaneous tissue was anesthetized with 4 cc of    1% lidocaine.  Under direct fluoroscopic guidance, a 22-gauge 3-1/2 inch    spinal needle was advanced into the right L5-S1 neural foramen under    oblique and AP views.  Needle tip placement was confirmed under AP view    with the tip at the 6 oclock position on the L5 pedicle.  Further    confirmation was obtained after negative aspiration with the injection of    1 cc of Omnipaque 180 revealing a right L5 epiradiculogram.  No vascular    uptake noted.  This was then followed by injection of 1 cc of Kenalog 40 mg    per cc plus 1  cc of preservative free 1% lidocaine.  There were no    complications.  The patient tolerated the procedure well.  Discharge    instructions given.  Post-injection pain score is 5/10. 2. Will give the patient Ultracet one p.o. t.i.d. as needed for pain, #14,    samples given. 3. The patient is to return to clinic in two weeks for reevaluation and    possible repeat transforaminal epidural steroid injection.  The patient was educated on the above findings and recommendations, and understands.  There were no barriers to communication. Dictated by:   Sondra Come, M.D. Attending Physician:   Sondra Come DD:  06/27/01 TD:  06/28/01 Job: [id number]

## 2010-10-20 NOTE — Op Note (Signed)
NAME:  Peter Richards, Peter Richards NO.:  [id number]   MEDICAL RECORD NO.:  [id number]          PATIENT TYPE:  AMB   LOCATION:  [facility]                         FACILITY:  [facility]   PHYSICIAN:  Valetta Fuller, M.D.  DATE OF BIRTH:  03-29-1929   DATE OF PROCEDURE:  02/07/2005  DATE OF DISCHARGE:                                 OPERATIVE REPORT   PREOPERATIVE DIAGNOSES:  1.  Bladder neck obstruction and benign prostatic hypertrophy.  2.  History of eosinophilic cystitis.   POSTOPERATIVE DIAGNOSES:  1.  Bladder neck obstruction and benign prostatic hypertrophy.  2.  History of eosinophilic cystitis.   PROCEDURE PERFORMED:  Cystoscopy with bladder biopsy and fulguration and  transurethral incision of the prostate/.   SURGEON:  Dr. Isabel Caprice.   ANESTHESIA:  General.   INDICATIONS:  Peter Richards a 77 year old male.  The patient originally  presented because of some significant worsening of his urinary status.  On  assessment, he had abnormal mucosa on his bladder wall, and he underwent  biopsies back in February 2005.  This showed chronic inflammation consistent  with eosinophilic cystitis, and the patient had no evidence of transitional  cell carcinoma.  He was treated with a short course of steroids and Atarax  and got significantly better.  The patient persisted to have some minimal  microhematuria, and follow-up cystoscopy showed some just generalized slight  erythema of the bladder but nothing more significant.  He has had some  longstanding outlet obstruction.  The patient recently developed a sudden  increase in frequency, urgency, and dysuria.  He had presented to a walk-in  clinic where they had treated him for prostatitis, but we did not have  definitive documentation that that is what was going on.  His symptoms did,  however, respond quite nicely to antibiotic therapy.  The patient has  continued to have outlet symptoms despite Flomax.  On previous cystoscopy,  he was  noted to have some visual obstruction and a fairly prominent median  bar.  We felt that the patient did need some repeat bladder biopsies to  further assess his bladder, and we also discussed with him the possibility  of proceeding with a concurrent operation to help his outlet obstruction to  try to allow him to have increased anticholinergic medication for his  bladder symptoms, and we thought a TURP would be reasonable.  The patient  appeared the patient understand the benefits and risks of this type  procedure, and full informed consent was obtained.   TECHNIQUE AND FINDINGS:  The patient was brought to the operating room where  he had successful induction of general anesthesia.  He was placed in  lithotomy position and prepped and draped in the usual manner.  Cystoscopy  revealed unremarkable anterior urethra.  The patient had some lateral lobe  hyperplasia but a very prominent and high-riding median bar.  The bladder  itself showed some mild diffuse erythema.  There were several areas where  the mucosa showed some increased thickening and increased inflammation.  Cold cup biopsies were done in 3 or 4 places randomly in the areas of  maximum abnormality.  These areas were then fulgurated.  We then removed the  cystoscope and placed a continuous flow resectoscope with a Collins knife.  We then performed a  transurethral incision of the prostate from between the ureteral orifices  back to the verumontanum.  This resulted in marked improvement in the visual  obstruction with flattening of the median bar.  Hemostasis was excellent.  A  22-French Foley catheter was placed.  The patient was brought to recovery  room in stable condition.           ______________________________  Valetta Fuller, M.D.     DSG/MEDQ  D:  02/07/2005  T:  02/07/2005  Job:  [id number]

## 2010-10-20 NOTE — H&P (Signed)
. [facility]  Patient:    Peter Richards, DOBOSZ                     MRN: [id number] Adm. Date:  12/21/00 Attending:  Gwenith Daily. Tyrone Sage, M.D. Dictator:   Marlowe Kays, P.A. CC:         Dr. Santa Lighter A. Patty Sermons, M.D.   History and Physical  DATE OF BIRTH:  [birth year]  CHIEF COMPLAINT:  CAD  HISTORY OF PRESENT ILLNESS:  [name] is a pleasant 77 year old white male referred by Dr. Patty Sermons for evaluation of two vessel CAD.  The patient has known CAD dating back to 18 as he suffered an MI at that time. He subsequently underwent cardiac catheterization in 1998 and since then has been treated medically.  Recently, he developed substernal chest pain relieved with nitroglycerin as well as left-sided chest pain especially with coughing and sneezing which he thought was of musculoskeletal nature.  On a visit to Dr. Patty Sermons the patient discussed his symptoms.  A Cardiolite test was performed which revealed apical ischemia and transient LV dilatation. Followup cardiac catheterization revealed severe two vessel CAD with proximal to mid high grade LAD with peripheral dysfunction.  Because of these findings, the patient was referred to CVTS.  Dr. Tyrone Sage recommended to proceed with CABG as the best treatment.  This is scheduled for December 27, 2000. Other than mild dyspnea no exertion, atypical anginal symptoms as mentioned above, occasional palpitations, the patient denies any shortness of breath at rest or PND.  No cough or sputum production.  The anginal symptoms are not radiated to the arm or to the jaw.  No nausea, vomiting, or diaphoresis.  No fever or chills.  No symptoms of TIA, CVA, or amaurosis fugax.  PAST MEDICAL HISTORY:  CAD as above, hypertension, hypercholesterolemia, ejection fraction 60%, status post MI 1981, history of skin cancer.  PAST SURGICAL HISTORY:  Status post cardiac catheterization July 2002 by Dr. Burgess Estelle,  with total RCA with collateral filling left to right and greater than 80% LAD proximal to mid, status post cardiac catheterization 1988, status post repair of AAA in 1999 by Dr. Edilia Bo, status post neck surgery (C5-C6 bone spur surgery), status post left knee arthroscopy, status post left shoulder surgery 2002, status post cystoscopy and catheterization of bladder ulcer 40 years ago, status post excision of a lower lid lesion secondary to cancer, status post appendectomy in the 1960s.  MEDICATIONS: 1. Aspirin q.d. 2. Cardizem CD 120 mg q.d. 3. Tylenol PM for sleep.  ALLERGIES:  CODEINE causes nausea.  REVIEW OF SYSTEMS:  See HPI and past medical history for significant positives.  No diabetes, kidney disease, asthma, or CHF.  FAMILY HISTORY:  Mother died at 25 of CHF.  Father died at 55 of gastric cancer.  One sister with a history of CABG and MVR who died of hemorrhage while on Coumadin.  SOCIAL HISTORY:  Married.  Three children.  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  He quit in 1982 60-pack-year cigarettes.  He only drinks alcohol at social occasions.  PHYSICAL EXAMINATION  GENERAL:  Well-developed, well-nourished 77 year old white male in no acute distress.  Alert and oriented x 3.  VITAL SIGNS:  Blood pressure 140/70, pulse 64, respirations 18.  HEENT:  Normocephalic, atraumatic.  PERRLA.  EOMI.  Funduscopic examination within normal limits.  NECK:  Supple.  No JVD.  Bilateral bruits more pronounced on  the left than on the right.  No lymphadenopathy.  CHEST:  Symmetrical on inspiration.  Lungs:  Clear to auscultation.  CARDIOVASCULAR:  Regular rate and rhythm with 1/6 murmur in the left sternal border and occasional pulse at the apex.  No rubs or gallops.  ABDOMEN:  Soft, nontender.  Bowel sounds x 4.  No masses or bruits.  GENITOURINARY:  Deferred.  RECTAL:  Deferred.  EXTREMITIES:  No cyanosis, clubbing, or edema.   No ulcerations.  Warm temperature.  Peripheral pulses:  Carotid, femoral, popliteal, dorsalis pedis, posterior tibialis 2+.  NEUROLOGIC:  Nonfocal.  Gait steady.  DTRs 2+.  Muscle strength 5/5.  ASSESSMENT AND PLAN:  Two vessel coronary artery disease for coronary artery bypass grafting, questionably off pump December 27, 2000 at [facility]. Dr. Tyrone Sage has seen and evaluated this patient prior to the admission and has explained the risks and benefits involving the procedure and the patient has agreed to continue. DD:  12/25/00 TD:  12/25/00 Job: [id number]

## 2010-10-20 NOTE — Discharge Summary (Signed)
NAME:  Peter Richards, Peter Richards NO.:  [id number]   MEDICAL RECORD NO.:  [id number]          PATIENT TYPE:  INP   LOCATION:  1429                         FACILITY:  [facility]   PHYSICIAN:  Valetta Fuller, M.D.  DATE OF BIRTH:  Mar 17, 1929   DATE OF ADMISSION:  04/25/2005  DATE OF DISCHARGE:                                 DISCHARGE SUMMARY   DISCHARGE DIAGNOSES:  1.  Transitional cell carcinoma of the bladder.  2.  Adenocarcinoma of the prostate.   PROCEDURES PERFORMED:  Radical cystoprostatectomy with bilateral pelvic  lymph node dissection and ileal loop urinary diversion on April 25, 2005.   HISTORY OF PRESENT ILLNESS:  [name] is a 77 year old male with a  complex history over the last 2 years. He had initially developed sudden  onset of severe irritative voiding symptoms and initial evaluation revealed  an abnormal mass in his bladder. Extensive biopsies revealed eosinophilic  cystitis and his symptoms resolved with a course of steroids and Atarax. The  patient then did well for a period of time but developed recurrent hematuria  and recurrent voiding symptoms. Subsequent biopsy revealed a T1 grade 3  transitional cell carcinoma involving the lateral wall of his bladder. 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 Given  the combination of multifocal worrisome bladder cancer with superficial  invasion and high grade disease, as well as CIS involving the prostate and  severe ongoing voiding symptoms, decision was made to go ahead and proceed  with radical cystoprostatectomy. No suspicion for prostate cancer was noted  preoperatively. The patient does have a history of coronary artery disease  and had undergone preoperative cardiac assessment including a Cardiolite  study which showed no evidence of ischemia.   The patient's past medical history was also significant for a previous  abdominal aneurysm which had been repaired in the  past. His medications at  the time of admission included Lexapro, metoprolol, Ditropan XL, Flomax,  Colace, Tylenol, and p.r.n. nitroglycerin.   HOSPITAL COURSE:  The patient underwent relatively uneventful radical  cystoprostatectomy with ileal loop urinary diversion. He was initially  monitored and managed in the intensive care unit. His postoperative  hemoglobin did drift down to 8.6, and for that reason one of the on-call  physicians elected to transfuse him with 2 units of packed blood cells. His  hemoglobin improved appropriately to around 11.7. Renal function remained  normal. He was able to be transferred out of the intensive care unit and  after resolution of his ileus was able to start a diet. The rest of his  clinical course postoperatively was quite uneventful. Drainage slowed down  and the Foley catheter as well as JP drain were removed. The Foley catheter  has been used as a pelvic drain. The patient remained afebrile. Stoma  appeared pink and healthy and he continued to have excellent urinary output.  He did have bowel activity and was able to tolerate a general diet at the  time of discharge. The final pathology had initially revealed at the time of  frozen section some dysplasia of the distal  ureter, but subsequent sampling  more proximal revealed unremarkable ureters bilaterally. Within the bladder  there was some residual carcinoma in situ but no residual invasive disease.  The prostate resection did show some urothelial atypia in the area of the  previous biopsy site. There was also incidentally noted prostate cancer with  bilateral Gleason's 3 + 4 = 7 prostate cancer and negative margins. Seminal  vesicles were negative. All lymph nodes were negative for cancer.   DISPOSITION:  The patient was discharged home on postoperative day #7. At  that point he had good bowel activity with normal bowel movements and  minimal discomfort. He was tolerating p.o. well. He remained  afebrile with  unremarkable exam. The patient will be discharged to his assisted living  facility. Follow-up will be established in 5-7 days for reassessment and  staple removal.           ______________________________  Valetta Fuller, M.D.  Electronically Signed     DSG/MEDQ  D:  05/02/2005  T:  05/02/2005  Job:  [id number]

## 2010-10-24 LAB — PROTIME-INR: INR: 2.4 — AB (ref <=1.1)

## 2010-10-26 ENCOUNTER — Ambulatory Visit: Payer: Self-pay | Admitting: *Deleted

## 2010-11-06 ENCOUNTER — Encounter: Payer: Self-pay | Admitting: Internal Medicine

## 2010-11-06 ENCOUNTER — Encounter: Payer: Self-pay | Admitting: Cardiology

## 2010-11-15 ENCOUNTER — Ambulatory Visit (INDEPENDENT_AMBULATORY_CARE_PROVIDER_SITE_OTHER): Payer: Medicare Other | Admitting: *Deleted

## 2010-11-15 DIAGNOSIS — I4891 Unspecified atrial fibrillation: Secondary | ICD-10-CM | POA: Insufficient documentation

## 2010-11-15 DIAGNOSIS — I2510 Atherosclerotic heart disease of native coronary artery without angina pectoris: Secondary | ICD-10-CM

## 2010-11-15 LAB — POCT INR: INR: 2.9

## 2010-11-22 ENCOUNTER — Encounter: Payer: Self-pay | Admitting: Internal Medicine

## 2010-11-22 ENCOUNTER — Ambulatory Visit (INDEPENDENT_AMBULATORY_CARE_PROVIDER_SITE_OTHER): Payer: Medicare Other | Admitting: Internal Medicine

## 2010-11-22 DIAGNOSIS — Z8679 Personal history of other diseases of the circulatory system: Secondary | ICD-10-CM

## 2010-11-22 DIAGNOSIS — I495 Sick sinus syndrome: Secondary | ICD-10-CM

## 2010-11-22 DIAGNOSIS — I4891 Unspecified atrial fibrillation: Secondary | ICD-10-CM

## 2010-11-22 DIAGNOSIS — Z950 Presence of cardiac pacemaker: Secondary | ICD-10-CM

## 2010-11-22 NOTE — Assessment & Plan Note (Addendum)
His device is working normally. We'll recheck in several months. Today we turn on her response.

## 2010-11-22 NOTE — Patient Instructions (Signed)
Your physician wants you to follow-up in: 12 months with Dr Court Joy will receive a reminder letter in the mail two months in advance. If you don't receive a letter, please call our office to schedule the follow-up appointment.   Remote monitoring is used to monitor your Pacemaker of ICD from home. This monitoring reduces the number of office visits required to check your device to one time per year. It allows [place] to keep an eye on the functioning of your device to ensure it is working properly. You are scheduled for a device check from home on 02/22/2011. You may send your transmission at any time that day. If you have a wireless device, the transmission will be sent automatically. After your physician reviews your transmission, you will receive a postcard with your next transmission date.

## 2010-11-22 NOTE — Assessment & Plan Note (Signed)
He appears to be maintaining sinus rhythm. Continue his current medications.

## 2010-11-22 NOTE — Progress Notes (Signed)
HPI [name] returns today for followup. He is a very pleasant 77 year old male with a history of symptomatic bradycardia status post permanent pacemaker insertion. The patient's procedure was carried out over a year ago at the time of his implantation, he had a microscopic pericardial perforation. This was subsequently revised without sequela. The patient has done well in the interim. He denies chest pain, shortness of breath, or peripheral edema. He has had no syncope. He remains active. Allergies  Allergen Reactions  . Codeine      Current Outpatient Prescriptions  Medication Sig Dispense Refill  . amiodarone (PACERONE) 200 MG tablet Take 100 mg by mouth daily.        . Cholecalciferol (VITAMIN D3) 400 UNITS tablet Take 400 Units by mouth daily.        [name] escitalopram (LEXAPRO) 20 MG tablet Take 20 mg by mouth daily.        [name] gabapentin (NEURONTIN) 300 MG capsule Take 300 mg by mouth daily.        [name] HYDROcodone-acetaminophen (NORCO) 5-325 MG per tablet Take 1 tablet by mouth as needed.        . metoprolol succinate (TOPROL-XL) 25 MG 24 hr tablet Take 25 mg by mouth daily.        . Polyethylene Glycol 3350 POWD as directed.        . rosuvastatin (CRESTOR) 5 MG tablet Take 5 mg by mouth daily.        [name] warfarin (COUMADIN) 5 MG tablet 1 tablet daily except 1 1/2 1 day a week or as directed  95 tablet  3  . zolpidem (AMBIEN) 10 MG tablet Take 10 mg by mouth at bedtime as needed.           Past Medical History  Diagnosis Date  . CAD (coronary artery disease)   . Hyperlipidemia   . History of abdominal aortic aneurysm   . History of atrial fibrillation   . Orthostatic hypotension   . Presence of permanent cardiac pacemaker   . Bradycardia   . Syncope     ROS:   All systems reviewed and negative except as noted in the HPI.   Past Surgical History  Procedure Date  . Pacemaker insertion      No family history on file.   History   Social History  . Marital Status:  Married    Spouse Name: N/A    Number of Children: N/A  . Years of Education: N/A   Occupational History  . Not on file.   Social History Main Topics  . Smoking status: Never Smoker   . Smokeless tobacco: Not on file  . Alcohol Use: Not on file  . Drug Use: Not on file  . Sexually Active: Not on file   Other Topics Concern  . Not on file   Social History Narrative  . No narrative on file     BP 117/75  Pulse 59  Ht 5\' 8"  (1.727 m)  Wt 190 lb (86.183 kg)  BMI 28.89 kg/m2  Physical Exam:  well appearing NAD HEENT: Unremarkable Neck:  No JVD, no thyromegally Lymphatics:  No adenopathy Back:  No CVA tenderness Lungs:  Clear. Well-healed pacemaker incision. HEART:  Regular rate rhythm, no murmurs, no rubs, no clicks Abd:  positive bowel sounds, no organomegally, no rebound, no guarding Ext:  2 plus pulses, no edema, no cyanosis, no clubbing Skin:  No rashes no nodules Neuro:  CN II through XII intact, motor  grossly intact  DEVICE  Normal device function.  See PaceArt for details.   Assess/Plan:

## 2010-11-22 NOTE — Assessment & Plan Note (Signed)
He has had no episodes of atrial fibrillation since his last pacemaker interrogation. He will continue his current medications.

## 2010-12-12 LAB — POCT INR: INR: 2.36

## 2010-12-13 ENCOUNTER — Ambulatory Visit: Payer: Self-pay | Admitting: *Deleted

## 2010-12-15 ENCOUNTER — Encounter: Payer: Self-pay | Admitting: Cardiology

## 2010-12-29 ENCOUNTER — Encounter: Payer: Self-pay | Admitting: Cardiology

## 2011-01-01 ENCOUNTER — Encounter: Payer: Self-pay | Admitting: Cardiology

## 2011-01-01 ENCOUNTER — Ambulatory Visit (INDEPENDENT_AMBULATORY_CARE_PROVIDER_SITE_OTHER): Payer: Medicare Other | Admitting: Cardiology

## 2011-01-01 ENCOUNTER — Ambulatory Visit (INDEPENDENT_AMBULATORY_CARE_PROVIDER_SITE_OTHER): Payer: Medicare Other | Admitting: *Deleted

## 2011-01-01 DIAGNOSIS — E785 Hyperlipidemia, unspecified: Secondary | ICD-10-CM

## 2011-01-01 DIAGNOSIS — Z8679 Personal history of other diseases of the circulatory system: Secondary | ICD-10-CM

## 2011-01-01 DIAGNOSIS — Z8551 Personal history of malignant neoplasm of bladder: Secondary | ICD-10-CM | POA: Insufficient documentation

## 2011-01-01 DIAGNOSIS — I2510 Atherosclerotic heart disease of native coronary artery without angina pectoris: Secondary | ICD-10-CM

## 2011-01-01 DIAGNOSIS — I4891 Unspecified atrial fibrillation: Secondary | ICD-10-CM

## 2011-01-01 LAB — POCT INR: INR: 2.5

## 2011-01-01 NOTE — Progress Notes (Signed)
Peter Richards Date of Birth:  09-13-1928 [facility] [address]   [address] [place], [place]  [address] [phone]           Fax   [phone]  History of Present Illness: This pleasant 77 year old married Caucasian gentleman is seen for a scheduled followup office visit.  He has a history of coronary artery disease.  He underwent coronary artery bypass graft surgery in [address].  He has a history of paroxysmal atrial fibrillation and has been on long-term Coumadin.  In June of 2011 he had syncope at home and was found to be in marked sinus bradycardia.  Essentially underwent dual-chamber pacemaker insertion by Dr. Juanda Chance.  Postoperatively he did have some pericarditis which resolved he has had no recurrence of the pericarditis pain.  Dr. [name] has been checking his dual-chamber pacemaker.  The patient has had no further syncope.  He does have occasional mild dizziness when he first stands up from a chair.  The patient has hypercholesterolemia and will be seeing his primary care physician Dr. Pete Glatter in September and he'll be getting lipid studies At that time.  Current Outpatient Prescriptions  Medication Sig Dispense Refill  . amiodarone (PACERONE) 200 MG tablet Take 100 mg by mouth daily.        . Cholecalciferol (VITAMIN D3) 400 UNITS tablet Take 1,000 Units by mouth daily.       [name] escitalopram (LEXAPRO) 20 MG tablet Take 20 mg by mouth daily.        [name] gabapentin (NEURONTIN) 300 MG capsule Take 300 mg by mouth daily.        [name] HYDROcodone-acetaminophen (NORCO) 5-325 MG per tablet Take 1 tablet by mouth as needed.        . metoprolol succinate (TOPROL-XL) 25 MG 24 hr tablet Take 25 mg by mouth daily.        . Polyethylene Glycol 3350 POWD as directed.        . warfarin (COUMADIN) 5 MG tablet 1 tablet daily except 1 1/2 1 day a week or as directed  95 tablet  3  . zolpidem (AMBIEN) 10 MG tablet Take 10 mg by mouth at bedtime as needed. Taking 1/2  tablet        Allergies  Allergen Reactions  . Codeine   . Crestor (Rosuvastatin Calcium)     Leg cramps    Patient Active Problem List  Diagnoses  . HYPERLIPIDEMIA  . CORONARY ARTERY DISEASE  . BRADYCARDIA  . HYPOTENSION, ORTHOSTATIC  . SYNCOPE  . CHEST PAIN  . ABDOMINAL AORTIC ANEURYSM, HX OF  . ATRIAL FIBRILLATION, HX OF  . PACEMAKER, PERMANENT  . Atrial fibrillation  . A-fib  . Hx of bladder cancer    History  Smoking status  . Never Smoker   Smokeless tobacco  . Not on file    History  Alcohol Use: Not on file    No family history on file.  Review of Systems: Constitutional: no fever chills diaphoresis or fatigue or change in weight.  Head and neck: no hearing loss, no epistaxis, no photophobia or visual disturbance. Respiratory: No cough, shortness of breath or wheezing. Cardiovascular: No chest pain peripheral edema, palpitations. Gastrointestinal: No abdominal distention, no abdominal pain, no change in bowel habits hematochezia or melena. Genitourinary: No dysuria, no frequency, no urgency, no nocturia.The patient has a neobladder secondary to previous bladder cancer. Musculoskeletal:No arthralgias, no back pain, no gait disturbance or myalgias. Neurological: No dizziness,  no headaches, no numbness, no seizures, no syncope, no weakness, no tremors. Hematologic: No lymphadenopathy, no easy bruising. Psychiatric: No confusion,And the patient has been having some vivid dreams and he is attempting to use of Ambien for that reason.    Physical Exam: Filed Vitals:   01/01/11 1142  BP: 120/80  Pulse: 60  The general appearance reveals a well-developed well-nourished gentleman in no distress.Pupils equal and reactive.   Extraocular Movements are full.  There is no scleral icterus.  The mouth and pharynx are normal.  The neck is supple.  The carotids reveal no bruits.  The jugular venous pressure is normal.  The thyroid is not enlarged.  There is no  lymphadenopathy.  The chest is clear to percussion and auscultation. There are no rales or rhonchi. Expansion of the chest is symmetrical.  The precordium is quiet.  The first heart sound is normal.  The second heart sound is physiologically split.  There is no murmur gallop rub or click.  There is no abnormal lift or heave.The pacemaker pocket is well healed.  The abdomen reveals no hepatosplenomegaly or masses.  He does have a neobladder in the right lower quadrant.  Normal extremity without phlebitis or edema.  Pedal pulses are present.The skin is warm and dry.  There is no rash.  Strength is normal and symmetrical in all extremities.  There is no lateralizing weakness.  There are no sensory deficits.  His INR today is therapeutic at 2.5     Assessment / Plan:  The patient is to continue same medication.  Recheck in 4 months for followup office visit and EKG and protime.  He will continue to get monthly protimes at [name] until we see him again.

## 2011-01-01 NOTE — Assessment & Plan Note (Signed)
The patient has a history of coronary artery bypass graft surgery.  His surgery was in 2002 by Dr. Tyrone Sage.  The patient has not had any recurrent angina pectoris.  His last treadmill adenosine Cardiolite stress test was 04/21/08 and showed no ischemia and his ejection fraction was 61%.  He has had some occasional chest pain which does not sound cardiac and which is not related to exertion and which goes away on its own accord.  He has not had to take any sublingual nitroglycerin.

## 2011-01-01 NOTE — Assessment & Plan Note (Signed)
The patient has a history of tachybradycardia syndrome with paroxysmal atrial fibrillation.  He is on long-term Coumadin.  He has a functioning pacemaker.  He is maintaining normal sinus rhythm on present medication which includes low dose amiodarone.

## 2011-01-01 NOTE — Assessment & Plan Note (Signed)
The patient has a history of hyperlipidemia.  Since we last saw him his weight has gone up further.  He is not getting any regular exercise.  The hot weather has kept him inside the house.  He does have a exercise facility at [facility] but he has not been using it.  He is scheduled to get his lipids checked at Dr. Laverle Hobby office in September.

## 2011-01-01 NOTE — Assessment & Plan Note (Signed)
The patient has a past history of bladder cancer.  He is 5 years post bladder cancer surgery and he has a neobladder.  The system is working well and is not having any present symptoms related to his bladder.  He has not been aware of any hematuria from his Coumadin

## 2011-01-24 ENCOUNTER — Other Ambulatory Visit: Payer: Self-pay | Admitting: *Deleted

## 2011-01-24 DIAGNOSIS — I4891 Unspecified atrial fibrillation: Secondary | ICD-10-CM

## 2011-01-24 MED ORDER — AMIODARONE HCL 200 MG PO TABS: ORAL_TABLET | ORAL | Status: DC

## 2011-01-24 NOTE — Telephone Encounter (Signed)
Refilled meds per fax request.  

## 2011-01-30 LAB — PROTIME-INR: INR: 2.1 — AB (ref 0.9–1.1)

## 2011-01-31 ENCOUNTER — Ambulatory Visit (INDEPENDENT_AMBULATORY_CARE_PROVIDER_SITE_OTHER): Payer: Self-pay | Admitting: Cardiology

## 2011-01-31 DIAGNOSIS — R0989 Other specified symptoms and signs involving the circulatory and respiratory systems: Secondary | ICD-10-CM

## 2011-02-14 ENCOUNTER — Telehealth: Payer: Self-pay | Admitting: Cardiology

## 2011-02-14 DIAGNOSIS — I4891 Unspecified atrial fibrillation: Secondary | ICD-10-CM

## 2011-02-14 NOTE — Telephone Encounter (Signed)
Has appointment scheduled.

## 2011-02-14 NOTE — Telephone Encounter (Signed)
Pt calling regarding epidural. Pt wants to get coumadin levels check prior to epidural. I checked all pt records and cannot find that pt is a est. Coumadin pt. Please return call to discuss/clarify.

## 2011-02-15 ENCOUNTER — Other Ambulatory Visit: Payer: Self-pay | Admitting: Geriatric Medicine

## 2011-02-15 DIAGNOSIS — M545 Low back pain: Secondary | ICD-10-CM

## 2011-02-20 ENCOUNTER — Other Ambulatory Visit: Payer: Self-pay | Admitting: *Deleted

## 2011-02-20 DIAGNOSIS — I4891 Unspecified atrial fibrillation: Secondary | ICD-10-CM

## 2011-02-20 MED ORDER — METOPROLOL SUCCINATE ER 25 MG PO TB24: 25.0000 mg | ORAL_TABLET | Freq: Every day | ORAL | Status: DC

## 2011-02-20 NOTE — Telephone Encounter (Signed)
Refilled meds per fax request.  

## 2011-02-21 ENCOUNTER — Ambulatory Visit (INDEPENDENT_AMBULATORY_CARE_PROVIDER_SITE_OTHER): Payer: Medicare Other | Admitting: *Deleted

## 2011-02-21 ENCOUNTER — Ambulatory Visit
Admission: RE | Admit: 2011-02-21 | Discharge: 2011-02-21 | Disposition: A | Discharge status: Home / Self Care | Payer: Medicare Other | Source: Ambulatory Visit | Attending: Geriatric Medicine | Admitting: Geriatric Medicine

## 2011-02-21 DIAGNOSIS — M545 Low back pain: Secondary | ICD-10-CM

## 2011-02-21 DIAGNOSIS — I4891 Unspecified atrial fibrillation: Secondary | ICD-10-CM

## 2011-02-21 LAB — POCT INR: INR: 1.1

## 2011-02-21 MED ORDER — IOHEXOL 180 MG/ML  SOLN
1.0000 mL | Freq: Once | INTRAMUSCULAR | Status: AC | PRN
  Administered 2011-02-21: 1 mL via EPIDURAL

## 2011-02-21 MED ORDER — METHYLPREDNISOLONE ACETATE 40 MG/ML INJ SUSP (RADIOLOG
120.0000 mg | Freq: Once | INTRAMUSCULAR | Status: AC
  Administered 2011-02-21: 120 mg via EPIDURAL

## 2011-02-22 ENCOUNTER — Encounter: Payer: Medicare Other | Admitting: *Deleted

## 2011-02-23 ENCOUNTER — Other Ambulatory Visit: Payer: Self-pay | Admitting: Internal Medicine

## 2011-02-23 ENCOUNTER — Ambulatory Visit (INDEPENDENT_AMBULATORY_CARE_PROVIDER_SITE_OTHER): Payer: Medicare Other | Admitting: *Deleted

## 2011-02-23 ENCOUNTER — Encounter: Payer: Self-pay | Admitting: Internal Medicine

## 2011-02-23 DIAGNOSIS — I498 Other specified cardiac arrhythmias: Secondary | ICD-10-CM

## 2011-02-23 DIAGNOSIS — R001 Bradycardia, unspecified: Secondary | ICD-10-CM

## 2011-02-25 ENCOUNTER — Encounter: Payer: Self-pay | Admitting: *Deleted

## 2011-03-05 LAB — REMOTE PACEMAKER DEVICE
AL IMPEDENCE PM: 498 Ohm
AL THRESHOLD: 0.625 V
BAMS-0001: 175 {beats}/min
RV LEAD AMPLITUDE: 22.4 mv
RV LEAD IMPEDENCE PM: 544 Ohm
RV LEAD THRESHOLD: 1.125 V

## 2011-03-06 LAB — COMPREHENSIVE METABOLIC PANEL
AST: 20
Albumin: 4.5
CO2: 27
Calcium: 10
Creatinine, Ser: 0.9
GFR calc Af Amer: >60
GFR calc non Af Amer: >60
Total Protein: 7.9

## 2011-03-06 LAB — URINALYSIS, ROUTINE W REFLEX MICROSCOPIC
Bilirubin Urine: NEGATIVE
Glucose, UA: NEGATIVE
Nitrite: POSITIVE — AB
Specific Gravity, Urine: 1.01
pH: 6.5

## 2011-03-06 LAB — URINE MICROSCOPIC-ADD ON

## 2011-03-06 LAB — CBC
MCHC: 34.2
MCV: 90.3
Platelets: 231
RDW: 13.6

## 2011-03-06 LAB — DIFFERENTIAL
Eosinophils Relative: 3
Lymphocytes Relative: 28
Lymphs Abs: 2.6
Monocytes Relative: 14 — ABNORMAL HIGH

## 2011-03-06 LAB — LIPASE, BLOOD: Lipase: 68

## 2011-03-07 ENCOUNTER — Encounter: Payer: Self-pay | Admitting: *Deleted

## 2011-03-07 ENCOUNTER — Ambulatory Visit (INDEPENDENT_AMBULATORY_CARE_PROVIDER_SITE_OTHER): Payer: Self-pay | Admitting: Internal Medicine

## 2011-03-07 DIAGNOSIS — R0989 Other specified symptoms and signs involving the circulatory and respiratory systems: Secondary | ICD-10-CM

## 2011-03-07 LAB — COMPREHENSIVE METABOLIC PANEL
AST: 34
Albumin: 3.4 — ABNORMAL LOW
Calcium: 8.7
Creatinine, Ser: 1.21
GFR calc Af Amer: >60
Total Protein: 6.4

## 2011-03-07 LAB — CBC
MCHC: 33.9
MCV: 93.2
Platelets: 191

## 2011-03-08 NOTE — Progress Notes (Signed)
Pacer remote 

## 2011-03-13 LAB — BASIC METABOLIC PANEL
BUN: 15
BUN: 18
CO2: 27
CO2: 27
Calcium: 9.3
Chloride: 106
Chloride: 107
GFR calc Af Amer: >60
GFR calc non Af Amer: >60
GFR calc non Af Amer: >60
GFR calc non Af Amer: >60
Glucose, Bld: 101 — ABNORMAL HIGH
Glucose, Bld: 93
Potassium: 3.9
Potassium: 4
Potassium: 4.1
Potassium: 4.4
Potassium: 4.7
Sodium: 136
Sodium: 137
Sodium: 140
Sodium: 143

## 2011-03-13 LAB — PROTIME-INR
INR: 1.7 — ABNORMAL HIGH
INR: 2 — ABNORMAL HIGH
Prothrombin Time: 18.1 — ABNORMAL HIGH
Prothrombin Time: 20.4 — ABNORMAL HIGH

## 2011-03-13 LAB — URINE CULTURE

## 2011-03-13 LAB — ABO/RH: ABO/RH(D): A NEG

## 2011-03-13 LAB — DIFFERENTIAL
Eosinophils Absolute: 0.1
Eosinophils Absolute: 0.2
Eosinophils Relative: 1
Lymphocytes Relative: 35
Lymphs Abs: 2
Lymphs Abs: 2.9
Monocytes Relative: 14 — ABNORMAL HIGH
Monocytes Relative: 9
Neutro Abs: 4.1
Neutrophils Relative %: 48

## 2011-03-13 LAB — COMPREHENSIVE METABOLIC PANEL
ALT: 16
ALT: 16
AST: 19
CO2: 27
Calcium: 9
Calcium: 9.8
GFR calc Af Amer: >60
Glucose, Bld: 73
Sodium: 137
Sodium: 139
Total Protein: 6.6
Total Protein: 6.8

## 2011-03-13 LAB — CBC
HCT: 35.8 — ABNORMAL LOW
HCT: 35.9 — ABNORMAL LOW
Hemoglobin: 12.3 — ABNORMAL LOW
Hemoglobin: 12.4 — ABNORMAL LOW
Hemoglobin: 12.4 — ABNORMAL LOW
Hemoglobin: 12.7 — ABNORMAL LOW
MCHC: 34.3
MCHC: 34.6
MCHC: 34.8
MCHC: 34.1
MCHC: 34.3
MCV: 91.9
MCV: 92.8
Platelets: 222
RBC: 3.86 — ABNORMAL LOW
RBC: 3.89 — ABNORMAL LOW
RBC: 4.33
RDW: 14.5
RDW: 14.6
RDW: 14.7
RDW: 14.9 — ABNORMAL HIGH
RDW: 15.1
WBC: 8.3

## 2011-03-13 LAB — URINALYSIS, ROUTINE W REFLEX MICROSCOPIC
Bilirubin Urine: NEGATIVE
Glucose, UA: NEGATIVE
Nitrite: POSITIVE — AB
Specific Gravity, Urine: 1.007
Specific Gravity, Urine: 1.021
Urobilinogen, UA: 0.2
pH: 8

## 2011-03-13 LAB — HEPARIN LEVEL (UNFRACTIONATED)
Heparin Unfractionated: 0.3
Heparin Unfractionated: 0.39
Heparin Unfractionated: 0.48
Heparin Unfractionated: 0.58

## 2011-03-13 LAB — LIPID PANEL
LDL Cholesterol: 64
Total CHOL/HDL Ratio: 4.6
Triglycerides: 283 — ABNORMAL HIGH
VLDL: 57 — ABNORMAL HIGH

## 2011-03-13 LAB — VITAMIN B12: Vitamin B-12: 396 (ref 211–911)

## 2011-03-13 LAB — URINE MICROSCOPIC-ADD ON

## 2011-03-13 LAB — TSH: TSH: 1.38

## 2011-03-13 LAB — I-STAT 8, (EC8 V) (CONVERTED LAB)
Bicarbonate: 25.7 — ABNORMAL HIGH
HCT: 40
Hemoglobin: 13.6
Operator id: 270651
Sodium: 140
TCO2: 27

## 2011-03-13 LAB — HOMOCYSTEINE: Homocysteine: 12.4

## 2011-03-13 LAB — POCT I-STAT CREATININE: Operator id: 270651

## 2011-03-13 LAB — TYPE AND SCREEN

## 2011-03-19 LAB — PROTIME-INR
INR: 1.3
Prothrombin Time: 16.1 — ABNORMAL HIGH

## 2011-03-19 LAB — CBC
Platelets: 231
RDW: 14.1 — ABNORMAL HIGH
WBC: 7.3

## 2011-03-20 LAB — CBC
Platelets: 237
RBC: 4.18 — ABNORMAL LOW
WBC: 7

## 2011-03-20 LAB — CARDIAC PANEL(CRET KIN+CKTOT+MB+TROPI)
Relative Index: INVALID
Relative Index: INVALID
Total CK: 49
Troponin I: 0.01

## 2011-03-20 LAB — I-STAT 8, (EC8 V) (CONVERTED LAB)
BUN: 20
Bicarbonate: 23.6
HCT: 42
Operator id: 257131
TCO2: 25
pCO2, Ven: 40.4 — ABNORMAL LOW

## 2011-03-20 LAB — DIFFERENTIAL
Eosinophils Absolute: 0.2
Lymphocytes Relative: 27
Lymphs Abs: 1.9
Monocytes Relative: 14 — ABNORMAL HIGH
Neutro Abs: 3.9
Neutrophils Relative %: 56

## 2011-03-20 LAB — POCT CARDIAC MARKERS
CKMB, poc: 1
CKMB, poc: 1
Myoglobin, poc: 67.4
Myoglobin, poc: 77.4
Operator id: 151321
Operator id: 270111

## 2011-03-20 LAB — PROTIME-INR
INR: 1.7 — ABNORMAL HIGH
Prothrombin Time: 20.8 — ABNORMAL HIGH

## 2011-03-20 LAB — POCT I-STAT CREATININE: Operator id: 257131

## 2011-03-20 LAB — LIPID PANEL
Cholesterol: 172
HDL: 32 — ABNORMAL LOW
LDL Cholesterol: 89
Total CHOL/HDL Ratio: 5.4

## 2011-03-26 ENCOUNTER — Telehealth: Payer: Self-pay | Admitting: Cardiology

## 2011-03-26 NOTE — Telephone Encounter (Signed)
Pt called He said he got a rechargable toothbrush and wanted to check if ok to use with his device

## 2011-03-26 NOTE — Telephone Encounter (Signed)
Wanted to know if OK to use rechargeable toothbrush w/his device. Advised OK to use.

## 2011-04-04 ENCOUNTER — Ambulatory Visit (INDEPENDENT_AMBULATORY_CARE_PROVIDER_SITE_OTHER): Payer: Self-pay | Admitting: Internal Medicine

## 2011-04-04 DIAGNOSIS — R0989 Other specified symptoms and signs involving the circulatory and respiratory systems: Secondary | ICD-10-CM

## 2011-04-04 DIAGNOSIS — I4891 Unspecified atrial fibrillation: Secondary | ICD-10-CM

## 2011-04-06 ENCOUNTER — Other Ambulatory Visit (HOSPITAL_COMMUNITY): Payer: Self-pay | Admitting: Geriatric Medicine

## 2011-04-18 ENCOUNTER — Other Ambulatory Visit (HOSPITAL_COMMUNITY): Payer: Medicare Other

## 2011-04-18 ENCOUNTER — Ambulatory Visit (HOSPITAL_COMMUNITY): Payer: Medicare Other

## 2011-04-20 ENCOUNTER — Ambulatory Visit (HOSPITAL_COMMUNITY)
Admission: RE | Admit: 2011-04-20 | Discharge: 2011-04-20 | Disposition: A | Discharge status: Home / Self Care | Payer: Medicare Other | Source: Ambulatory Visit | Attending: Geriatric Medicine | Admitting: Geriatric Medicine

## 2011-04-20 ENCOUNTER — Other Ambulatory Visit: Payer: Self-pay | Admitting: Geriatric Medicine

## 2011-04-20 DIAGNOSIS — R1313 Dysphagia, pharyngeal phase: Secondary | ICD-10-CM | POA: Insufficient documentation

## 2011-04-20 DIAGNOSIS — M4800 Spinal stenosis, site unspecified: Secondary | ICD-10-CM

## 2011-04-20 NOTE — Procedures (Signed)
Modified Barium Swallow Procedure Note Patient Details  Name: Peter Richards MRN: [id number] Date of Birth: 01/21/1929  Today's Date: 04/20/2011 Time: 5621-3086 Time Calculation (min): 30 min  Past Medical History:  Past Medical History  Diagnosis Date  . CAD (coronary artery disease)   . Hyperlipidemia   . History of abdominal aortic aneurysm   . History of atrial fibrillation   . Orthostatic hypotension   . Presence of permanent cardiac pacemaker   . Bradycardia   . Syncope    Past Surgical History:  Past Surgical History  Procedure Date  . Pacemaker insertion    HPI: Patient reports "strangling" or "choking" on liquids and difficulty with solids, stating "they feel stuck in my chest."   These symptoms have been present for 6 months with worsening in the last month, without any associated medical illness or lifestyle change.  Furthermore, patient reports a history of reflux, however does not currently have symptoms or take any medicaiton.  Recommendation/Prognosis   Dysphagia Diagnosis: Mild pharyngeal phase dysphagia Clinical impression: Demonstrates an overall functional oral-pharygneal swallow with the majoirty of observed swallows without any laryngeal penetration or tracheal aspiration.  When patient used sequential swallows via cup or straw with thin liquids, no aspiration observed.  However, when patient took only single sips of thin, the bolus prematurely spilled into the pharynx before the swallow trigger with a very small amount of silent aspiration before the swallow trigger.  Cued cough cleared aspirate effectively.  Use of compensatory strategy of cough after thin liquid swallow will be effective in preventing aspiration. *Esophageal sweep revealed what appeared to be thoracic level stasis of solid/puree as well as what appeared to be tertiary contractions and prolonged transit into stomach.  This finding appears consistent with patient's reports of "food is stuck in  my chest."  Recommendations 1. Consider esophageal assessment 2. Regular textures & Thin liquids 3. Compensations: Hard cough after swallow 4. Upright 30-60 min after meal;Seated upright 90 degrees  Myra Rude, M.S.,CCC-SLP Pager [phone]

## 2011-04-23 ENCOUNTER — Telehealth: Payer: Self-pay | Admitting: Pharmacist

## 2011-04-23 NOTE — Telephone Encounter (Signed)
Advised patient ok to hold coumadin 5 days prior to procedure

## 2011-04-23 NOTE — Telephone Encounter (Signed)
The patient is maintaining normal sinus rhythm.  He may leave off his Coumadin for 5 days prior to epidural.

## 2011-04-23 NOTE — Telephone Encounter (Signed)
Pt pending epidural injection on 11/26.  Needs clearance to hold Coumadin x 5 days prior to procedure.

## 2011-04-30 ENCOUNTER — Ambulatory Visit
Admission: RE | Admit: 2011-04-30 | Discharge: 2011-04-30 | Disposition: A | Discharge status: Home / Self Care | Payer: Medicare Other | Source: Ambulatory Visit | Attending: Geriatric Medicine | Admitting: Geriatric Medicine

## 2011-04-30 ENCOUNTER — Ambulatory Visit (INDEPENDENT_AMBULATORY_CARE_PROVIDER_SITE_OTHER): Payer: Medicare Other | Admitting: *Deleted

## 2011-04-30 DIAGNOSIS — M4800 Spinal stenosis, site unspecified: Secondary | ICD-10-CM

## 2011-04-30 DIAGNOSIS — I4891 Unspecified atrial fibrillation: Secondary | ICD-10-CM

## 2011-04-30 MED ORDER — IOHEXOL 180 MG/ML  SOLN: 1.0000 mL | Freq: Once | INTRAMUSCULAR | Status: AC | PRN

## 2011-04-30 MED ORDER — METHYLPREDNISOLONE ACETATE 40 MG/ML INJ SUSP (RADIOLOG: 120.0000 mg | Freq: Once | INTRAMUSCULAR | Status: DC

## 2011-05-01 ENCOUNTER — Telehealth: Payer: Self-pay | Admitting: Radiology

## 2011-05-01 NOTE — Telephone Encounter (Signed)
Patient called 04/30/11 at 1531 after LEPI at 1245 to report some numbness in left leg and foot.  Nurse and MD were not in the building by this time, so he stated it was okay for nurse to call him in morning.  This morning, 05/01/11 at 0845, he reports no numbness "at all" but will call if it returns.  jkl

## 2011-05-02 ENCOUNTER — Encounter: Payer: Self-pay | Admitting: Cardiology

## 2011-05-02 ENCOUNTER — Ambulatory Visit (INDEPENDENT_AMBULATORY_CARE_PROVIDER_SITE_OTHER): Payer: Medicare Other | Admitting: Cardiology

## 2011-05-02 DIAGNOSIS — M545 Low back pain: Secondary | ICD-10-CM | POA: Insufficient documentation

## 2011-05-02 DIAGNOSIS — Z9889 Other specified postprocedural states: Secondary | ICD-10-CM

## 2011-05-02 DIAGNOSIS — I4891 Unspecified atrial fibrillation: Secondary | ICD-10-CM

## 2011-05-02 DIAGNOSIS — Z951 Presence of aortocoronary bypass graft: Secondary | ICD-10-CM

## 2011-05-02 DIAGNOSIS — R079 Chest pain, unspecified: Secondary | ICD-10-CM

## 2011-05-02 DIAGNOSIS — E785 Hyperlipidemia, unspecified: Secondary | ICD-10-CM

## 2011-05-02 DIAGNOSIS — G4700 Insomnia, unspecified: Secondary | ICD-10-CM

## 2011-05-02 MED ORDER — ZOLPIDEM TARTRATE 10 MG PO TABS: 5.0000 mg | ORAL_TABLET | Freq: Every evening | ORAL | Status: DC | PRN

## 2011-05-02 NOTE — Progress Notes (Signed)
Peter Richards Date of Birth:  07-15-1928 [facility] / [facility] [address]   [address] [place], [place]  [address] [phone]           Fax   [phone]  History of Present Illness: This pleasant 77 year old gentleman is seen for a scheduled followup office visit.  He has a history of paroxysmal atrial fibrillation.  Recently he has been in normal sinus rhythm.  He has a past history of tachybradycardia syndrome and has a dual-chamber pacemaker.  His pacemaker is followed by Dr. [name].  The patient has had no further syncope since his pacemaker.  He has a history of hypercholesterolemia and Dr. Pete Glatter follows that.  The patient is on amiodarone 100 mg daily and has been maintaining sinus rhythm.  He is on long-term Coumadin because he himself is unaware when he goes into atrial fib.  Current Outpatient Prescriptions  Medication Sig Dispense Refill  . amiodarone (PACERONE) 200 MG tablet Take 1/2 tablet daily  45 tablet  3  . Cholecalciferol (VITAMIN D3) 400 UNITS tablet Take 1,000 Units by mouth daily.       [name] escitalopram (LEXAPRO) 20 MG tablet Take 20 mg by mouth daily.        [name] gabapentin (NEURONTIN) 300 MG capsule Take 100 mg by mouth daily.       [name] HYDROcodone-acetaminophen (NORCO) 5-325 MG per tablet Take 1 tablet by mouth as needed.        . metoprolol succinate (TOPROL-XL) 25 MG 24 hr tablet Take 1 tablet (25 mg total) by mouth daily.  90 tablet  3  . warfarin (COUMADIN) 5 MG tablet 1 tablet daily except 1 1/2 1 day a week or as directed  95 tablet  3  . zolpidem (AMBIEN) 10 MG tablet Take 0.5 tablets (5 mg total) by mouth at bedtime as needed and may repeat dose one time if needed. Taking 1/2 tablet  90 tablet  3  . Polyethylene Glycol 3350 POWD as directed.          Allergies  Allergen Reactions  . Crestor (Rosuvastatin Calcium)     Leg cramps  . Codeine Nausea And Vomiting    Patient Active Problem List  Diagnoses  . HYPERLIPIDEMIA  .  CORONARY ARTERY DISEASE  . BRADYCARDIA  . HYPOTENSION, ORTHOSTATIC  . SYNCOPE  . CHEST PAIN  . ABDOMINAL AORTIC ANEURYSM, HX OF  . ATRIAL FIBRILLATION, HX OF  . PACEMAKER, PERMANENT  . Atrial fibrillation  . A-fib  . Hx of bladder cancer  . Dyslipidemia    History  Smoking status  . Never Smoker   Smokeless tobacco  . Not on file    History  Alcohol Use: Not on file    No family history on file.  Review of Systems: Constitutional: no fever chills diaphoresis or fatigue or change in weight.  Head and neck: no hearing loss, no epistaxis, no photophobia or visual disturbance. Respiratory: No cough, shortness of breath or wheezing. Cardiovascular: No chest pain peripheral edema, palpitations. Gastrointestinal: No abdominal distention, no abdominal pain, no change in bowel habits hematochezia or melena. Genitourinary: No dysuria, no frequency, no urgency, no nocturia. Musculoskeletal:No arthralgias, no back pain, no gait disturbance or myalgias. Neurological: No dizziness, no headaches, no numbness, no seizures, no syncope, no weakness, no tremors. Hematologic: No lymphadenopathy, no easy bruising. Psychiatric: No confusion, no hallucinations, no sleep disturbance.    Physical Exam: Filed Vitals:   05/02/11 1116  BP: 128/88  Pulse: 80   the general appearance reveals an elderly gentleman in no acute distress.Pupils equal and reactive.   Extraocular Movements are full.  There is no scleral icterus.  The mouth and pharynx are normal.  The neck is supple.  The carotids reveal no bruits.  The jugular venous pressure is normal.  The thyroid is not enlarged.  There is no lymphadenopathy.  The chest is clear to percussion and auscultation. There are no rales or rhonchi. Expansion of the chest is symmetrical.  Heart reveals a regular paced rhythm.  No murmur gallop or rub. The abdomen reveals an ileostomy in the right lower quadrant. The pedal pulses are good.  There is no  phlebitis or edema.  There is no cyanosis or clubbing.  He does have good pedal pulses bilaterally.Strength is normal and symmetrical in all extremities.  There is no lateralizing weakness.  There are no sensory deficits.  The skin is warm and dry.  There is no rash.  The EKG today shows atrial paced rhythm with a prolonged AV conduction and no ischemic changes.    Assessment / Plan: Continue on same medication.  Recheck in 4 months for followup office visit and at that visit we will also check lab work to followup on amiodarone usage chronically.

## 2011-05-02 NOTE — Assessment & Plan Note (Signed)
The patient has had a lot of low back pain with radiation of discomfort into both legs.  He had a recent epidural steroid earlier this week after holding his warfarin.  He has not noted any clinical improvement following the epidural injection.

## 2011-05-02 NOTE — Assessment & Plan Note (Signed)
The patient has not had any TIA symptoms or strokes.  He has not had any problem from his Coumadin.

## 2011-05-02 NOTE — Assessment & Plan Note (Signed)
Patient has a history of ischemic heart disease and had coronary artery bypass graft surgery in 2002.  He has not been experiencing any recurrent angina pectoris.

## 2011-05-02 NOTE — Patient Instructions (Signed)
Your physician recommends that you continue on your current medications as directed. Please refer to the Current Medication list given to you today.  Your physician recommends that you schedule a follow-up appointment in: 4 months with fasting labs (lp/bmet/hfp)  

## 2011-05-08 LAB — PROTIME-INR: INR: 1.7 — AB (ref 0.9–1.1)

## 2011-05-09 ENCOUNTER — Ambulatory Visit (INDEPENDENT_AMBULATORY_CARE_PROVIDER_SITE_OTHER): Payer: Self-pay | Admitting: Cardiovascular Disease

## 2011-05-09 ENCOUNTER — Other Ambulatory Visit: Payer: Self-pay | Admitting: *Deleted

## 2011-05-09 DIAGNOSIS — R0989 Other specified symptoms and signs involving the circulatory and respiratory systems: Secondary | ICD-10-CM

## 2011-05-09 DIAGNOSIS — I4891 Unspecified atrial fibrillation: Secondary | ICD-10-CM

## 2011-05-09 MED ORDER — AMIODARONE HCL 200 MG PO TABS: ORAL_TABLET | ORAL | Status: DC

## 2011-05-09 NOTE — Telephone Encounter (Signed)
Refilled amiodarone 

## 2011-05-22 LAB — PROTIME-INR: INR: 3.4 — AB (ref 0.9–1.1)

## 2011-05-23 ENCOUNTER — Ambulatory Visit (INDEPENDENT_AMBULATORY_CARE_PROVIDER_SITE_OTHER): Payer: Self-pay | Admitting: Cardiology

## 2011-05-23 DIAGNOSIS — I4891 Unspecified atrial fibrillation: Secondary | ICD-10-CM

## 2011-05-23 DIAGNOSIS — R0989 Other specified symptoms and signs involving the circulatory and respiratory systems: Secondary | ICD-10-CM

## 2011-05-31 ENCOUNTER — Ambulatory Visit (INDEPENDENT_AMBULATORY_CARE_PROVIDER_SITE_OTHER): Payer: Medicare Other | Admitting: *Deleted

## 2011-05-31 ENCOUNTER — Encounter: Payer: Self-pay | Admitting: Internal Medicine

## 2011-05-31 ENCOUNTER — Telehealth: Payer: Self-pay | Admitting: Internal Medicine

## 2011-05-31 ENCOUNTER — Other Ambulatory Visit: Payer: Self-pay | Admitting: Internal Medicine

## 2011-05-31 DIAGNOSIS — I4891 Unspecified atrial fibrillation: Secondary | ICD-10-CM

## 2011-05-31 NOTE — Telephone Encounter (Signed)
New Msg: Pt calling stating that he is having difficulty sending his remote device transmission. Please return pt call to discuss further.

## 2011-05-31 NOTE — Telephone Encounter (Signed)
Transmission received patient aware. 

## 2011-06-01 LAB — REMOTE PACEMAKER DEVICE
ATRIAL PACING PM: 97
BAMS-0001: 175 {beats}/min
BATTERY VOLTAGE: 2.79 V
RV LEAD AMPLITUDE: 22.4 mv
RV LEAD IMPEDENCE PM: 521 Ohm
VENTRICULAR PACING PM: 0

## 2011-06-07 NOTE — Progress Notes (Signed)
Remote pacer check  

## 2011-06-12 ENCOUNTER — Ambulatory Visit (INDEPENDENT_AMBULATORY_CARE_PROVIDER_SITE_OTHER): Payer: Self-pay | Admitting: Cardiology

## 2011-06-12 DIAGNOSIS — R0989 Other specified symptoms and signs involving the circulatory and respiratory systems: Secondary | ICD-10-CM

## 2011-06-12 DIAGNOSIS — I4891 Unspecified atrial fibrillation: Secondary | ICD-10-CM

## 2011-06-14 ENCOUNTER — Other Ambulatory Visit: Payer: Self-pay | Admitting: Geriatric Medicine

## 2011-06-14 DIAGNOSIS — M25551 Pain in right hip: Secondary | ICD-10-CM

## 2011-06-15 ENCOUNTER — Other Ambulatory Visit: Payer: Self-pay | Admitting: Geriatric Medicine

## 2011-06-15 ENCOUNTER — Ambulatory Visit
Admission: RE | Admit: 2011-06-15 | Discharge: 2011-06-15 | Disposition: A | Discharge status: Home / Self Care | Payer: Medicare Other | Source: Ambulatory Visit | Attending: Geriatric Medicine | Admitting: Geriatric Medicine

## 2011-06-15 DIAGNOSIS — M25551 Pain in right hip: Secondary | ICD-10-CM

## 2011-06-17 ENCOUNTER — Other Ambulatory Visit: Payer: Medicare Other

## 2011-06-20 ENCOUNTER — Encounter: Payer: Self-pay | Admitting: *Deleted

## 2011-06-26 ENCOUNTER — Ambulatory Visit (INDEPENDENT_AMBULATORY_CARE_PROVIDER_SITE_OTHER): Payer: Self-pay | Admitting: Cardiology

## 2011-06-26 DIAGNOSIS — I4891 Unspecified atrial fibrillation: Secondary | ICD-10-CM

## 2011-06-26 DIAGNOSIS — R0989 Other specified symptoms and signs involving the circulatory and respiratory systems: Secondary | ICD-10-CM

## 2011-06-26 LAB — POCT INR: INR: 4.39

## 2011-07-04 ENCOUNTER — Other Ambulatory Visit: Payer: Self-pay | Admitting: Sports Medicine

## 2011-07-04 DIAGNOSIS — M545 Low back pain: Secondary | ICD-10-CM

## 2011-07-06 ENCOUNTER — Ambulatory Visit
Admission: RE | Admit: 2011-07-06 | Discharge: 2011-07-06 | Disposition: A | Discharge status: Home / Self Care | Payer: Medicare Other | Source: Ambulatory Visit | Attending: Sports Medicine | Admitting: Sports Medicine

## 2011-07-06 DIAGNOSIS — M545 Low back pain: Secondary | ICD-10-CM

## 2011-07-06 MED ORDER — IOHEXOL 300 MG/ML  SOLN
100.0000 mL | Freq: Once | INTRAMUSCULAR | Status: AC | PRN
  Administered 2011-07-06: 100 mL via INTRAVENOUS

## 2011-07-10 ENCOUNTER — Ambulatory Visit (INDEPENDENT_AMBULATORY_CARE_PROVIDER_SITE_OTHER): Payer: Self-pay | Admitting: Cardiology

## 2011-07-10 DIAGNOSIS — R0989 Other specified symptoms and signs involving the circulatory and respiratory systems: Secondary | ICD-10-CM

## 2011-07-10 DIAGNOSIS — I4891 Unspecified atrial fibrillation: Secondary | ICD-10-CM

## 2011-07-10 LAB — POCT INR: INR: 3.44

## 2011-07-24 ENCOUNTER — Ambulatory Visit (INDEPENDENT_AMBULATORY_CARE_PROVIDER_SITE_OTHER): Payer: Self-pay | Admitting: Cardiology

## 2011-07-24 DIAGNOSIS — I4891 Unspecified atrial fibrillation: Secondary | ICD-10-CM

## 2011-07-24 DIAGNOSIS — R0989 Other specified symptoms and signs involving the circulatory and respiratory systems: Secondary | ICD-10-CM

## 2011-07-24 LAB — PROTIME-INR: INR: 2.8 — AB (ref 0.9–1.1)

## 2011-08-08 ENCOUNTER — Ambulatory Visit: Payer: Self-pay | Admitting: Cardiology

## 2011-08-08 DIAGNOSIS — I4891 Unspecified atrial fibrillation: Secondary | ICD-10-CM

## 2011-08-13 ENCOUNTER — Telehealth: Payer: Self-pay | Admitting: Cardiology

## 2011-08-13 NOTE — Telephone Encounter (Signed)
Forwarded to his nurse.

## 2011-08-13 NOTE — Telephone Encounter (Signed)
Okay to hold coumadin for 5 days prior to surgery.

## 2011-08-13 NOTE — Telephone Encounter (Signed)
New problem:  Patient need to come off coumadin due to upcoming surgery. - vertbro-plasty.

## 2011-08-13 NOTE — Telephone Encounter (Signed)
Will forward to  Dr. Brackbill for reveiw 

## 2011-08-14 ENCOUNTER — Other Ambulatory Visit: Payer: Self-pay | Admitting: Neurosurgery

## 2011-08-14 NOTE — Telephone Encounter (Signed)
Will fax to Fannie Knee

## 2011-08-15 ENCOUNTER — Encounter (HOSPITAL_COMMUNITY): Payer: Self-pay | Admitting: Pharmacy Technician

## 2011-08-20 ENCOUNTER — Encounter (HOSPITAL_COMMUNITY): Payer: Self-pay

## 2011-08-20 ENCOUNTER — Encounter (HOSPITAL_COMMUNITY)
Admission: RE | Admit: 2011-08-20 | Discharge: 2011-08-20 | Disposition: A | Discharge status: Home / Self Care | Payer: Medicare Other | Source: Ambulatory Visit | Attending: Neurosurgery | Admitting: Neurosurgery

## 2011-08-20 ENCOUNTER — Ambulatory Visit (HOSPITAL_COMMUNITY)
Admission: RE | Admit: 2011-08-20 | Discharge: 2011-08-20 | Disposition: A | Discharge status: Home / Self Care | Payer: Medicare Other | Source: Ambulatory Visit | Attending: Anesthesiology | Admitting: Anesthesiology

## 2011-08-20 DIAGNOSIS — Z01818 Encounter for other preprocedural examination: Secondary | ICD-10-CM | POA: Insufficient documentation

## 2011-08-20 HISTORY — DX: Acute myocardial infarction, unspecified: I21.9

## 2011-08-20 HISTORY — DX: Headache: R51

## 2011-08-20 HISTORY — DX: Gastro-esophageal reflux disease without esophagitis: K21.9

## 2011-08-20 HISTORY — DX: Unspecified osteoarthritis, unspecified site: M19.90

## 2011-08-20 HISTORY — DX: Essential (primary) hypertension: I10

## 2011-08-20 HISTORY — DX: Cerebral infarction, unspecified: I63.9

## 2011-08-20 LAB — BASIC METABOLIC PANEL
BUN: 22 mg/dL (ref 6–23)
CO2: 28 mEq/L (ref 19–32)
Calcium: 9.7 mg/dL (ref 8.4–10.5)
Chloride: 103 mEq/L (ref 96–112)
Creatinine, Ser: 1.15 mg/dL (ref 0.50–1.35)
GFR calc Af Amer: 67 mL/min — ABNORMAL LOW (ref >90)

## 2011-08-20 LAB — CBC
HCT: 37.2 % — ABNORMAL LOW (ref 39.0–52.0)
MCHC: 33.6 g/dL (ref 30.0–36.0)
MCV: 92.3 fL (ref 78.0–100.0)
Platelets: 263 10*3/uL (ref 150–400)
RDW: 14.6 % (ref 11.5–15.5)

## 2011-08-20 NOTE — Pre-Procedure Instructions (Addendum)
20 GARRELL FLAGG  08/20/2011   Your procedure is scheduled on:  Wednesday, 08/22/2011@12 :20PM  Report to [facility] at 7:30 AM.  Call this number if you have problems the morning of surgery: [phone]   Remember:   Do not eat food:After Midnight.  May have clear liquids: up to 4 Hours before arrival( nothing after 3:30AM).  Clear liquids include soda, tea, black coffee, apple or grape juice, broth.  Take these medicines the morning of surgery with A SIP OF WATER: Metoprolol, Norco or Percocet, Gabapentin, Amiodarone, Escitalopram(**Discontinue ASA, Plavix, Coumadin, Effient, and Herbal medications).   Do not wear jewelry, make-up or nail polish.  Do not wear lotions, powders, or perfumes. You may wear deodorant.  Do not shave 48 hours prior to surgery.  Do not bring valuables to the hospital.  Contacts, dentures or bridgework may not be worn into surgery.  Leave suitcase in the car. After surgery it may be brought to your room.  For patients admitted to the hospital, checkout time is 11:00 AM the day of discharge.   Patients discharged the day of surgery will not be allowed to drive home.  Name and phone number of your driver:   Special Instructions: CHG Shower Use Special Wash: 1/2 bottle night before surgery and 1/2 bottle morning of surgery.   Please read over the following fact sheets that you were given: Pain Booklet, Coughing and Deep Breathing and Surgical Site Infection Prevention

## 2011-08-21 ENCOUNTER — Encounter (HOSPITAL_COMMUNITY): Payer: Self-pay | Admitting: Vascular Surgery

## 2011-08-21 MED ORDER — CEFAZOLIN SODIUM 1-5 GM-% IV SOLN
1.0000 g | INTRAVENOUS | Status: AC
  Administered 2011-08-22: 1 g via INTRAVENOUS
  Filled 2011-08-21: qty 50

## 2011-08-21 NOTE — Consult Note (Signed)
Anesthesia:  Patient is a 78 year old male scheduled for a L2 vertebroplasty on 08/22/11.  History includes HLD, non-smoker, afib/PAF on Coumadin, Toprol, Amiodarone, syncope 11/2009 (none since PPM placed), tachy-brady syndrome s/p [employer] PPM, CAD/MI s/p CABG '02, headaches, AAA s/p repair, orthostatic hypotension, GERD, arthritis, TIA '07 s/p left CEA 11/08, and prostate cancer s/p radical cystoprostatectomy 11/06.  PCP is Dr. Pete Glatter.  His primary Cardiologist is Dr. Patty Sermons.  He is aware of planned procedure.  His last OV was from 05/02/11.  EKG from that visit showed a-pacing, prolonged AV conduction and no ischemic changes.  His last LHC was prior to his CABG.  His PPM was place on 11/15/09.   Echo on 01/08/08 showed mild LVH, normal LV systolic function with impaired relaxation, mild AS without AI, normal pulmonary artery pressure.  However, his latest echo on 11/21/09 did not show AS, EF 60%.  CXR showed: Progression of chronic interstitial lung disease.  No superimposed opacities suspicious for pneumonia.   Labs noted.  PT 16.0, but INR normal at 1.25. PTT WNL.  The Short Stay staff will follow-up on his perioperative cardiac device form faxed to [facility] on 08/20/11.  Anticipate he can proceed if no significant change in his status.

## 2011-08-21 NOTE — Progress Notes (Signed)
Refaxed Device Form to [employer], received fax confirmation. Spoke with Gunnar Fusi at clinic prior and she stated she did not have the form.

## 2011-08-22 ENCOUNTER — Encounter (HOSPITAL_COMMUNITY): Payer: Self-pay | Admitting: Surgery

## 2011-08-22 ENCOUNTER — Encounter (HOSPITAL_COMMUNITY)
Admission: RE | Disposition: A | Discharge status: Other Acute Inpatient Hospital | Payer: Self-pay | Source: Ambulatory Visit | Attending: Neurosurgery

## 2011-08-22 ENCOUNTER — Inpatient Hospital Stay (HOSPITAL_COMMUNITY)
Admission: RE | Admit: 2011-08-22 | Discharge: 2011-08-25 | DRG: 517 | Payer: Medicare Other | Source: Ambulatory Visit | Attending: Neurosurgery | Admitting: Neurosurgery

## 2011-08-22 ENCOUNTER — Ambulatory Visit (HOSPITAL_COMMUNITY): Payer: Medicare Other | Admitting: Vascular Surgery

## 2011-08-22 ENCOUNTER — Encounter (HOSPITAL_COMMUNITY): Payer: Self-pay | Admitting: Vascular Surgery

## 2011-08-22 ENCOUNTER — Ambulatory Visit (HOSPITAL_COMMUNITY): Payer: Medicare Other

## 2011-08-22 DIAGNOSIS — Z8546 Personal history of malignant neoplasm of prostate: Secondary | ICD-10-CM

## 2011-08-22 DIAGNOSIS — Z8551 Personal history of malignant neoplasm of bladder: Secondary | ICD-10-CM

## 2011-08-22 DIAGNOSIS — S32009A Unspecified fracture of unspecified lumbar vertebra, initial encounter for closed fracture: Principal | ICD-10-CM | POA: Diagnosis present

## 2011-08-22 DIAGNOSIS — Y998 Other external cause status: Secondary | ICD-10-CM

## 2011-08-22 DIAGNOSIS — R296 Repeated falls: Secondary | ICD-10-CM | POA: Diagnosis present

## 2011-08-22 DIAGNOSIS — S32000A Wedge compression fracture of unspecified lumbar vertebra, initial encounter for closed fracture: Secondary | ICD-10-CM | POA: Diagnosis present

## 2011-08-22 HISTORY — PX: VERTEBROPLASTY: SHX113

## 2011-08-22 SURGERY — VERTEBROPLASTY
Anesthesia: General | Site: Spine Lumbar | Wound class: Clean

## 2011-08-22 MED ORDER — METOPROLOL SUCCINATE ER 25 MG PO TB24
25.0000 mg | ORAL_TABLET | Freq: Every day | ORAL | Status: DC
  Administered 2011-08-23 – 2011-08-24 (×2): 25 mg via ORAL
  Filled 2011-08-22 (×3): qty 1

## 2011-08-22 MED ORDER — FENTANYL CITRATE 0.05 MG/ML IJ SOLN
INTRAMUSCULAR | Status: DC | PRN
  Administered 2011-08-22: 100 ug via INTRAVENOUS

## 2011-08-22 MED ORDER — ONDANSETRON HCL 4 MG/2ML IJ SOLN: 2.0000 mg | Freq: Four times a day (QID) | INTRAMUSCULAR | Status: DC | PRN

## 2011-08-22 MED ORDER — SIMVASTATIN 10 MG PO TABS
10.0000 mg | ORAL_TABLET | Freq: Every day | ORAL | Status: DC
  Administered 2011-08-22 – 2011-08-24 (×3): 10 mg via ORAL
  Filled 2011-08-22 (×5): qty 1

## 2011-08-22 MED ORDER — KCL IN DEXTROSE-NACL 20-5-0.45 MEQ/L-%-% IV SOLN
INTRAVENOUS | Status: DC
  Filled 2011-08-22 (×7): qty 1000

## 2011-08-22 MED ORDER — MORPHINE SULFATE 2 MG/ML IJ SOLN
INTRAMUSCULAR | Status: AC
  Administered 2011-08-22: 2 mg via INTRAVENOUS
  Filled 2011-08-22: qty 1

## 2011-08-22 MED ORDER — POLYETHYLENE GLYCOL 3350 17 G PO PACK
17.0000 g | PACK | Freq: Every day | ORAL | Status: DC | PRN
  Administered 2011-08-25: 17 g via ORAL
  Filled 2011-08-22: qty 1

## 2011-08-22 MED ORDER — LIDOCAINE-EPINEPHRINE 0.5-1:200000 % IJ SOLN
INTRAMUSCULAR | Status: DC | PRN
  Administered 2011-08-22: 50 mL

## 2011-08-22 MED ORDER — MORPHINE SULFATE 2 MG/ML IJ SOLN
2.0000 mg | INTRAMUSCULAR | Status: DC | PRN
  Administered 2011-08-22 – 2011-08-23 (×2): 2 mg via INTRAVENOUS
  Administered 2011-08-23: 4 mg via INTRAVENOUS
  Administered 2011-08-23: 2 mg via INTRAVENOUS
  Administered 2011-08-23 – 2011-08-25 (×5): 4 mg via INTRAVENOUS
  Filled 2011-08-22 (×2): qty 1
  Filled 2011-08-22 (×3): qty 2
  Filled 2011-08-22: qty 1
  Filled 2011-08-22 (×3): qty 2

## 2011-08-22 MED ORDER — PHENYLEPHRINE HCL 10 MG/ML IJ SOLN
INTRAMUSCULAR | Status: DC | PRN
  Administered 2011-08-22 (×3): 80 ug via INTRAVENOUS

## 2011-08-22 MED ORDER — ONDANSETRON HCL 4 MG/2ML IJ SOLN: 4.0000 mg | Freq: Once | INTRAMUSCULAR | Status: DC | PRN

## 2011-08-22 MED ORDER — GLYCOPYRROLATE 0.2 MG/ML IJ SOLN
INTRAMUSCULAR | Status: DC | PRN
  Administered 2011-08-22: .4 mg via INTRAVENOUS

## 2011-08-22 MED ORDER — IOHEXOL 300 MG/ML  SOLN
INTRAMUSCULAR | Status: DC | PRN
  Administered 2011-08-22: 50 mL

## 2011-08-22 MED ORDER — MUPIROCIN 2 % EX OINT
TOPICAL_OINTMENT | Freq: Two times a day (BID) | CUTANEOUS | Status: DC
  Administered 2011-08-22 – 2011-08-23 (×2): via NASAL
  Administered 2011-08-23: 1 via NASAL
  Administered 2011-08-24 – 2011-08-25 (×3): via NASAL
  Filled 2011-08-22: qty 22

## 2011-08-22 MED ORDER — ROCURONIUM BROMIDE 100 MG/10ML IV SOLN
INTRAVENOUS | Status: DC | PRN
  Administered 2011-08-22: 40 mg via INTRAVENOUS

## 2011-08-22 MED ORDER — 0.9 % SODIUM CHLORIDE (POUR BTL) OPTIME
TOPICAL | Status: DC | PRN
  Administered 2011-08-22: 1000 mL

## 2011-08-22 MED ORDER — CALCIUM CARBONATE-VITAMIN D 250-125 MG-UNIT PO TABS: 1.0000 | ORAL_TABLET | Freq: Every day | ORAL | Status: DC

## 2011-08-22 MED ORDER — HYDROCODONE-ACETAMINOPHEN 5-325 MG PO TABS
1.0000 | ORAL_TABLET | Freq: Four times a day (QID) | ORAL | Status: DC | PRN
  Administered 2011-08-24 – 2011-08-25 (×4): 1 via ORAL
  Filled 2011-08-22 (×5): qty 1

## 2011-08-22 MED ORDER — ONDANSETRON HCL 4 MG/2ML IJ SOLN
INTRAMUSCULAR | Status: DC | PRN
  Administered 2011-08-22: 4 mg via INTRAVENOUS

## 2011-08-22 MED ORDER — LACTATED RINGERS IV SOLN
INTRAVENOUS | Status: DC | PRN
  Administered 2011-08-22 (×2): via INTRAVENOUS

## 2011-08-22 MED ORDER — HYDROMORPHONE HCL PF 1 MG/ML IJ SOLN: 0.2500 mg | INTRAMUSCULAR | Status: DC | PRN

## 2011-08-22 MED ORDER — GABAPENTIN 100 MG PO CAPS
100.0000 mg | ORAL_CAPSULE | Freq: Every day | ORAL | Status: DC
  Administered 2011-08-23 – 2011-08-25 (×3): 100 mg via ORAL
  Filled 2011-08-22 (×3): qty 1

## 2011-08-22 MED ORDER — ESCITALOPRAM OXALATE 20 MG PO TABS
20.0000 mg | ORAL_TABLET | Freq: Every day | ORAL | Status: DC
  Administered 2011-08-23 – 2011-08-25 (×3): 20 mg via ORAL
  Filled 2011-08-22 (×3): qty 1

## 2011-08-22 MED ORDER — FENTANYL CITRATE 0.05 MG/ML IJ SOLN
INTRAMUSCULAR | Status: AC
  Administered 2011-08-22: 100 ug
  Filled 2011-08-22: qty 2

## 2011-08-22 MED ORDER — OXYCODONE-ACETAMINOPHEN 5-325 MG PO TABS
1.0000 | ORAL_TABLET | Freq: Four times a day (QID) | ORAL | Status: DC | PRN
  Administered 2011-08-23 – 2011-08-25 (×6): 1 via ORAL
  Filled 2011-08-22: qty 2
  Filled 2011-08-22 (×6): qty 1

## 2011-08-22 MED ORDER — CYCLOBENZAPRINE HCL 5 MG PO TABS
7.5000 mg | ORAL_TABLET | Freq: Three times a day (TID) | ORAL | Status: DC | PRN
  Administered 2011-08-23 – 2011-08-25 (×4): 7.5 mg via ORAL
  Filled 2011-08-22 (×6): qty 1.5

## 2011-08-22 MED ORDER — MORPHINE SULFATE 2 MG/ML IJ SOLN: 0.0500 mg/kg | INTRAMUSCULAR | Status: DC | PRN

## 2011-08-22 MED ORDER — CALCIUM CARBONATE-VITAMIN D 500-200 MG-UNIT PO TABS
1.0000 | ORAL_TABLET | Freq: Every day | ORAL | Status: DC
  Administered 2011-08-23 – 2011-08-25 (×3): 1 via ORAL
  Filled 2011-08-22 (×4): qty 1

## 2011-08-22 MED ORDER — MIDAZOLAM HCL 5 MG/5ML IJ SOLN
INTRAMUSCULAR | Status: DC | PRN
  Administered 2011-08-22: 1 mg via INTRAVENOUS

## 2011-08-22 MED ORDER — ZOLPIDEM TARTRATE 5 MG PO TABS
5.0000 mg | ORAL_TABLET | Freq: Every evening | ORAL | Status: DC | PRN
  Administered 2011-08-22 – 2011-08-24 (×3): 5 mg via ORAL
  Filled 2011-08-22 (×3): qty 1

## 2011-08-22 MED ORDER — AMIODARONE HCL 100 MG PO TABS
100.0000 mg | ORAL_TABLET | Freq: Every day | ORAL | Status: DC
  Administered 2011-08-23 – 2011-08-25 (×3): 100 mg via ORAL
  Filled 2011-08-22 (×3): qty 1

## 2011-08-22 MED ORDER — KCL IN DEXTROSE-NACL 20-5-0.45 MEQ/L-%-% IV SOLN
INTRAVENOUS | Status: AC
  Filled 2011-08-22: qty 1000

## 2011-08-22 MED ORDER — PROPOFOL 10 MG/ML IV EMUL
INTRAVENOUS | Status: DC | PRN
  Administered 2011-08-22: 150 mg via INTRAVENOUS

## 2011-08-22 MED ORDER — NEOSTIGMINE METHYLSULFATE 1 MG/ML IJ SOLN
INTRAMUSCULAR | Status: DC | PRN
  Administered 2011-08-22: 3 mg via INTRAVENOUS

## 2011-08-22 SURGICAL SUPPLY — 45 items
ADH SKN CLS APL DERMABOND .7 (GAUZE/BANDAGES/DRESSINGS) ×1
BLADE SURG 15 STRL LF DISP TIS (BLADE) ×1 IMPLANT
BLADE SURG 15 STRL SS (BLADE) ×2
BLADE SURG ROTATE 9660 (MISCELLANEOUS) IMPLANT
CEMENT BONE KYPHX HV R (Orthopedic Implant) ×1 IMPLANT
CEMENT KYPHON C01A KIT/MIXER (Cement) ×1 IMPLANT
CLOTH BEACON ORANGE TIMEOUT ST (SAFETY) ×2 IMPLANT
CONT SPEC 4OZ CLIKSEAL STRL BL (MISCELLANEOUS) ×4 IMPLANT
DERMABOND ADVANCED (GAUZE/BANDAGES/DRESSINGS) ×1
DERMABOND ADVANCED .7 DNX12 (GAUZE/BANDAGES/DRESSINGS) ×1 IMPLANT
DEVICE BIOPSY BONE KYPHX (INSTRUMENTS) ×2 IMPLANT
DEVICE FOR VERTEBROPLASTY ×1 IMPLANT
DRAPE C-ARM 42X72 X-RAY (DRAPES) ×2 IMPLANT
DRAPE LAPAROTOMY 100X72X124 (DRAPES) ×2 IMPLANT
DRAPE PROXIMA HALF (DRAPES) ×2 IMPLANT
DRAPE SURG 17X23 STRL (DRAPES) ×2 IMPLANT
DRESSING TELFA 8X3 (GAUZE/BANDAGES/DRESSINGS) IMPLANT
DURAPREP 26ML APPLICATOR (WOUND CARE) ×2 IMPLANT
GAUZE SPONGE 4X4 16PLY XRAY LF (GAUZE/BANDAGES/DRESSINGS) ×2 IMPLANT
GLOVE BIO SURGEON STRL SZ 6.5 (GLOVE) ×2 IMPLANT
GLOVE BIOGEL PI IND STRL 7.0 (GLOVE) IMPLANT
GLOVE BIOGEL PI INDICATOR 7.0 (GLOVE) ×1
GLOVE ECLIPSE 6.5 STRL STRAW (GLOVE) ×2 IMPLANT
GLOVE EXAM NITRILE LRG STRL (GLOVE) IMPLANT
GLOVE EXAM NITRILE MD LF STRL (GLOVE) IMPLANT
GLOVE EXAM NITRILE XL STR (GLOVE) IMPLANT
GLOVE EXAM NITRILE XS STR PU (GLOVE) IMPLANT
GOWN BRE IMP SLV AUR LG STRL (GOWN DISPOSABLE) ×4 IMPLANT
GOWN BRE IMP SLV AUR XL STRL (GOWN DISPOSABLE) IMPLANT
GOWN STRL REIN 2XL LVL4 (GOWN DISPOSABLE) IMPLANT
KIT BASIN OR (CUSTOM PROCEDURE TRAY) ×2 IMPLANT
KIT ROOM TURNOVER OR (KITS) ×2 IMPLANT
MARKER SKIN DUAL TIP RULER LAB (MISCELLANEOUS) ×1 IMPLANT
NDL HYPO 25X1 1.5 SAFETY (NEEDLE) ×1 IMPLANT
NEEDLE HYPO 25X1 1.5 SAFETY (NEEDLE) ×2 IMPLANT
NS IRRIG 1000ML POUR BTL (IV SOLUTION) ×2 IMPLANT
PACK SURGICAL SETUP 50X90 (CUSTOM PROCEDURE TRAY) ×2 IMPLANT
PAD ARMBOARD 7.5X6 YLW CONV (MISCELLANEOUS) ×6 IMPLANT
SPECIMEN JAR SMALL (MISCELLANEOUS) IMPLANT
STAPLER SKIN PROX WIDE 3.9 (STAPLE) ×2 IMPLANT
SUT VIC AB 3-0 SH 8-18 (SUTURE) ×2 IMPLANT
SYR CONTROL 10ML LL (SYRINGE) ×4 IMPLANT
TOWEL OR 17X24 6PK STRL BLUE (TOWEL DISPOSABLE) ×2 IMPLANT
TOWEL OR 17X26 10 PK STRL BLUE (TOWEL DISPOSABLE) ×2 IMPLANT
TRAY KYPHOPAK 20/3 ONESTEP 1ST (MISCELLANEOUS) ×1 IMPLANT

## 2011-08-22 NOTE — Transfer of Care (Signed)
Immediate Anesthesia Transfer of Care Note  Patient: Peter Richards  Procedure(s) Performed: Procedure(s) (LRB): VERTEBROPLASTY (N/A)  Patient Location: PACU  Anesthesia Type: General  Level of Consciousness: awake, alert  and oriented  Airway & Oxygen Therapy: Patient Spontanous Breathing and Patient connected to face mask oxygen  Post-op Assessment: Report given to PACU RN, Post -op Vital signs reviewed and stable and Patient moving all extremities  Post vital signs: Reviewed and stable  Complications: No apparent anesthesia complications

## 2011-08-22 NOTE — H&P (Signed)
BP 150/92  Pulse 76  Temp(Src) 97.6 F (36.4 C) (Oral)  Resp 18  SpO2 95%    HISTORY:                                        Peter Richards is an 78 year old, retired gentleman who is right-handed and presents today for evaluation of back pain.  He had two falls occurring this year; the second being on February 4th, 2013.  As a result of initial fall, he underwent an x-ray January 4th which showed no complications.  He then fell again February 4th and had a CT scan of the lumbar spine.  What that revealed was an L2 compression fracture.  Neurologically he has had absolutely no problems.       Fortunately, Dr. Cleophas Dunker ordered the CT because she was worried about the possibility of metastatic disease to the lumbar spine and at that time he did not want to wear a back brace because he said it caused too much compression of his ostomy.  That is as a result of the bladder cancer.  Nevertheless, he is coming in today for further evaluation.  He states that his pain currently is improved.  It hurts more if he is standing and/or walking.       PAST MEDICAL HISTORY:               Significant for hypertension, bladder tacks and bladder and prostate cancer.       FAMILY HISTORY:                           Mother and father are both deceased.       PAST SURGICAL HISTORY:              He has undergone resection of the bladder, bypass surgery, cholecystectomy and has had a pacemaker placed.       SOCIAL HISTORY:                           He does not smoke.  He does not use alcohol.  He does not have a history of substance abuse.  He reports being 5', 9" and weighing 185 lbs.       REVIEW OF SYSTEMS:                    Positive for eyeglasses, cataracts, hearing aid, hearing loss, hypertension, heart murmur, hypercholesterolemia, leg pain with walking, urinary tract infection, prostate cancer, bladder cancer, back pain, leg pain, arthritis and memory problems.  He denies allergic, hematologic,  endocrine, psychiatric, skin, gastrointestinal, respiratory or constitutional problems.       MEDICATIONS:                               Medications currently are Coumadin and Lopressor.         EXAMINATION:                               On examination he is alert, oriented x 4 and answering all questions appropriately.  Memory, language, attention span and fund of knowledge are normal.  Speech is clear and fluent.  Uvula elevates in the midline.  Shoulder shrug is normal.  Tongue protrudes in the midline.  Symmetric facial sensation and movement.  Hearing slightly decreased to finger rub bilaterally.  He has normal muscle tone, bulk and coordination.  Mild clonus in his right foot.  No Hoffmann's sign bilaterally.  He does have intact proprioception in the upper and lower extremities.  He has 2+ reflexes biceps, triceps, brachioradialis, knees and ankles.  Gait is steady.       DIAGNOSTIC STUDIES:                  CT of the lumbar spine shows a compression fracture at L2.  There is no bony retropulsion.  Significant amount of spondylitic change in the lumbar spine.  Degenerative disc problems at L5-S1, L4-5, L2-3, L1-2, T12-L1.  He maintains good alignment with this fracture.  Really it is just superior endplate and there is less than 10% compression of the vertebral body.    conservative treatment has failed. Thus we will proceed with vertebroplasty. Risks including bleeding infection, no pain relief, damage to the spinal cord, weakness, paralysis were discussed. He wishes to proceed.

## 2011-08-22 NOTE — Op Note (Signed)
08/22/2011  6:05 PM  PATIENT:  Peter Richards  78 y.o. male with an L2 compression fracture. He has not responded well to conservative treatment including bracing. He has decided to proceed with kyphoplasty.  PRE-OPERATIVE DIAGNOSIS:  lumbar compression fracture L2  POST-OPERATIVE DIAGNOSIS:  lumbar compression fracture L2  PROCEDURE:  Procedure(s): Kyphoplasty  SURGEON:  Surgeon(s): Carmela Hurt, MD  ASSISTANTS:none  ANESTHESIA:   general  EBL:  Total I/O In: 1000 [I.V.:1000] Out: -   BLOOD ADMINISTERED:none  CELL SAVER GIVEN:none  COUNT:per nursing  DRAINS: none   SPECIMEN:  No Specimen  DICTATION: [name] was brought to the operating room intubated and placed under a general anesthetic without difficulty. He was positioned on chest rolls prone on the operating table. With fluoroscopy landmarks for the L2 vertebral body were identified. He was then prepped and draped in a sterile fashion. I, made a small stab incision with a 15 blade on the Left side to cannulate the L2 pedicle. I cannulated the pedicle using AP and Lateral fluoro to guide me. I inflated the balloon and then placed the cement. The cement was placed without difficulty. Since I was at the midline and had good expansion of the balloon I chose to only do the procedure with this one needle. Final xray looked good. I removed the needle easily then closed. I placed one stitch, then used dermabond. I did use vicryl.  He was rolled supine on a stretcher then extubated doing well.  PLAN OF CARE: Admit for overnight observation  PATIENT DISPOSITION:  PACU - hemodynamically stable.   Delay start of Pharmacological VTE agent (>24hrs) due to surgical blood loss or risk of bleeding:  yes

## 2011-08-22 NOTE — Anesthesia Preprocedure Evaluation (Addendum)
Anesthesia Evaluation  Patient identified by MRN, date of birth, ID band Patient awake    Reviewed: Allergy & Precautions, H&P , NPO status , Patient's Chart, lab work & pertinent test results  Airway Mallampati: I TM Distance: >3 FB Neck ROM: Full    Dental  (+) Lower Dentures, Upper Dentures and Dental Advisory Given   Pulmonary  breath sounds clear to auscultation        Cardiovascular Rhythm:Regular Rate:Normal     Neuro/Psych    GI/Hepatic   Endo/Other    Renal/GU      Musculoskeletal   Abdominal   Peds  Hematology   Anesthesia Other Findings   Reproductive/Obstetrics                          Anesthesia Physical Anesthesia Plan  ASA: IV  Anesthesia Plan: General   Post-op Pain Management:    Induction: Intravenous  Airway Management Planned: Oral ETT  Additional Equipment:   Intra-op Plan:   Post-operative Plan: Extubation in OR  Informed Consent: I have reviewed the patients History and Physical, chart, labs and discussed the procedure including the risks, benefits and alternatives for the proposed anesthesia with the patient or authorized representative who has indicated his/her understanding and acceptance.   Dental advisory given  Plan Discussed with: CRNA, Anesthesiologist and Surgeon  Anesthesia Plan Comments:         Anesthesia Quick Evaluation

## 2011-08-22 NOTE — Progress Notes (Signed)
Subjective: Patient reports doing ok  Objective: Vital signs in last 24 hours: Temp:  [97.6 F (36.4 C)-98 F (36.7 C)] 98 F (36.7 C) (03/20 1840) Pulse Rate:  [61-76] 67  (03/20 1840) Resp:  [18] 18  (03/20 0919) BP: (136-150)/(65-98) 150/98 mmHg (03/20 1840) SpO2:  [95 %-100 %] 96 % (03/20 1840)  Intake/Output from previous day:   Intake/Output this shift:    Moving lower extremities well.  Lab Results:  Basename 08/20/11 1358  WBC 11.8*  HGB 12.5*  HCT 37.2*  PLT 263   BMET  Basename 08/20/11 1358  NA 139  K 4.4  CL 103  CO2 28  GLUCOSE 124*  BUN 22  CREATININE 1.15  CALCIUM 9.7    Studies/Results: Dg Lumbar Spine 2-3 Views  08/22/2011  *RADIOLOGY REPORT*  Clinical Data: Back pain  LUMBAR SPINE - 2-3 VIEW  Comparison: 07/06/2011  Findings: AP lateral C-arm films demonstrate radiodense material within the L2 vertebral body consistent with the given history of vertebroplasty.  No extraosseous spread is identified.  Slight L2 superior endplate depression is again noted.  IMPRESSION: L2 vertebral plasty as described.  Original Report Authenticated By: Elsie Stain, M.D.    Assessment/Plan: Doing well post op.  LOS: 0 days     Jerzy Crotteau L 08/22/2011, 7:06 PM

## 2011-08-23 MED ORDER — POLYVINYL ALCOHOL 1.4 % OP SOLN
1.0000 [drp] | OPHTHALMIC | Status: DC | PRN
  Administered 2011-08-23: 1 [drp] via OPHTHALMIC
  Filled 2011-08-23: qty 15

## 2011-08-23 NOTE — Evaluation (Signed)
Physical Therapy Evaluation Patient Details Name: Peter Richards MRN: [id number] DOB: 03/10/1929 Today's Date: 08/23/2011  Problem List:  Patient Active Problem List  Diagnoses  . HYPERLIPIDEMIA  . CORONARY ARTERY DISEASE  . BRADYCARDIA  . HYPOTENSION, ORTHOSTATIC  . SYNCOPE  . CHEST PAIN  . ABDOMINAL AORTIC ANEURYSM, HX OF  . ATRIAL FIBRILLATION, HX OF  . PACEMAKER, PERMANENT  . Atrial fibrillation  . A-fib  . Hx of bladder cancer  . Dyslipidemia  . Low back pain    Past Medical History:  Past Medical History  Diagnosis Date  . Hyperlipidemia   . History of abdominal aortic aneurysm   . History of atrial fibrillation   . Orthostatic hypotension   . Presence of permanent cardiac pacemaker   . Bradycardia   . Syncope   . Myocardial infarction     1981  . CAD (coronary artery disease)     DR BRACKBILL   . Hypertension   . Dysrhythmia   . Stroke     TIA  2007    . Shortness of breath     SINCE FALL 3 WEEKS AGOCHEST AND RIBS SORE  . No pertinent past medical history     COUGH   . Cancer   . GERD (gastroesophageal reflux disease)   . Headache   . Arthritis     OSTEOPOROSIS  . Aortic stenosis, mild     by 01/08/08 echo   Past Surgical History:  Past Surgical History  Procedure Date  . Pacemaker insertion   . Insert / replace / remove pacemaker     11/2009  DR BRODIE    . Coronary artery bypass graft     1999  . No past surgeries   . Prostatectomy     BLADDER ALSO REMOVED (OSTOMY BAG)  2006  FOR CANCER     . Abdominal aortic aneurysm repair     1999 STENT PLACED    . Cholecystectomy     4 YRS AGO  . Cataract extraction w/ intraocular lens  implant, bilateral   . Appendectomy     1959  . Revision urostomy cutaneous   . Revision urostomy cutaneous 2006    Urostomy bag placed on right lower abdomen    PT Assessment/Plan/Recommendation PT Assessment Clinical Impression Statement: Patient presents S/P kyphoplasty for compression fracture. He has  fallen 3 x in the last 3 months and I would therefore recommend PT to ensure that he can safely mobilize prior to returning home with his spouse thereby decreasing the burden of care and fall risk at discharge. PT Recommendation/Assessment: Patient will need skilled PT in the acute care venue PT Problem List: Decreased activity tolerance;Decreased balance;Decreased mobility;Pain;Decreased knowledge of precautions;Decreased knowledge of use of DME PT Therapy Diagnosis : Difficulty walking;Acute pain PT Plan PT Frequency: Min 5X/week PT Treatment/Interventions: DME instruction;Gait training;Functional mobility training;Therapeutic activities;Therapeutic exercise;Balance training;Patient/family education PT Recommendation Follow Up Recommendations: Skilled nursing facility @ [facility] Equipment Recommended: Defer to next venue PT Goals  Acute Rehab PT Goals PT Goal Formulation: With patient Time For Goal Achievement: 2 weeks Pt will Roll Supine to Right Side: with modified independence PT Goal: Rolling Supine to Right Side - Progress: Goal set today Pt will Roll Supine to Left Side: with modified independence PT Goal: Rolling Supine to Left Side - Progress: Goal set today Pt will go Supine/Side to Sit: with modified independence PT Goal: Supine/Side to Sit - Progress: Goal set today Pt will go Sit to Supine/Side: with  modified independence PT Goal: Sit to Supine/Side - Progress: Goal set today Pt will go Sit to Stand: with modified independence;with upper extremity assist PT Goal: Sit to Stand - Progress: Goal set today Pt will go Stand to Sit: with modified independence;with upper extremity assist PT Goal: Stand to Sit - Progress: Goal set today Pt will Ambulate: >150 feet;with modified independence PT Goal: Ambulate - Progress: Goal set today  PT Evaluation Precautions/Restrictions  Precautions Precautions: Back Precaution Booklet Issued: Yes (comment) Precaution Comments:  Educated in posture, body mechanics, and back precautions Prior Functioning  Home Living Lives With: Spouse Type of Home: Independent living facility Home Layout: One level Home Access: Level entry Bathroom Shower/Tub:  (Has both uses tub/shower usually, but recent fall) Bathroom Toilet: Handicapped height Home Adaptive Equipment: Built-in shower seat (in walk in shower) Prior Function Level of Independence: Independent with basic ADLs;Independent with gait;Independent with transfers Driving: Yes Comments: Facility provides meals that can be delivered to the room. Currently patient and spouse just eat dinner at dining room. Cognition Cognition Arousal/Alertness: Awake/alert Overall Cognitive Status: Appears within functional limits for tasks assessed Orientation Level: Oriented X4 Sensation/Coordination Sensation Light Touch: Appears Intact Proprioception: Appears Intact Coordination Gross Motor Movements are Fluid and Coordinated: Yes Fine Motor Movements are Fluid and Coordinated: Yes Extremity Assessment RLE Assessment RLE Assessment: Within Functional Limits LLE Assessment LLE Assessment: Within Functional Limits Mobility (including Balance) Bed Mobility Bed Mobility: Yes Rolling Left: 4: Min assist Rolling Left Details (indicate cue type and reason): For log roll technique Left Sidelying to Sit: 4: Min assist Left Sidelying to Sit Details (indicate cue type and reason): For sequencing of lower extremities and trunk Sitting - Scoot to Edge of Bed: 4: Min assist Sitting - Scoot to [place] of Bed Details (indicate cue type and reason): with use of pad. Sit to Sidelying Left: 4: Min assist Sit to Sidelying Left Details (indicate cue type and reason): to ensure legs are elevated with lowering of trunk Transfers Transfers: Yes Sit to Stand: 4: Min assist;With upper extremity assist;From bed Sit to Stand Details (indicate cue type and reason): For initiation and education of  hand placement to and from walker for safety. Stand to Sit: To bed;With upper extremity assist;4: Min assist Stand to Sit Details: Min-guard assistance Ambulation/Gait Ambulation/Gait: Yes Ambulation/Gait Assistance: 4: Min assist Ambulation/Gait Assistance Details (indicate cue type and reason): Min-guard assistance - verbal cues for upright posture and increased stride length Ambulation Distance (Feet): 120 Feet Assistive device: Rolling walker Gait Pattern: Step-through pattern;Decreased stride length;Trunk flexed  Static Sitting Balance Static Sitting - Balance Support: Feet supported;Bilateral upper extremity supported Static Sitting - Level of Assistance: 7: Independent Static Standing Balance Static Standing - Balance Support: Bilateral upper extremity supported Static Standing - Level of Assistance: 5: Stand by assistance End of Session PT - End of Session Equipment Utilized During Treatment: Gait belt Activity Tolerance: Patient tolerated treatment well Patient left: in bed;with call bell in reach Nurse Communication: Mobility status for transfers;Mobility status for ambulation General Behavior During Session: [facility] for tasks performed Cognition: [facility] for tasks performed  Edwyna Perfect, PT  Pager [phone]  08/23/2011, 2:57 PM

## 2011-08-24 ENCOUNTER — Encounter (HOSPITAL_COMMUNITY): Payer: Self-pay | Admitting: Neurosurgery

## 2011-08-24 DIAGNOSIS — S32000A Wedge compression fracture of unspecified lumbar vertebra, initial encounter for closed fracture: Secondary | ICD-10-CM | POA: Diagnosis present

## 2011-08-24 MED ORDER — WARFARIN SODIUM 5 MG PO TABS
5.0000 mg | ORAL_TABLET | Freq: Every day | ORAL | Status: DC
  Administered 2011-08-24: 5 mg via ORAL
  Filled 2011-08-24 (×2): qty 1

## 2011-08-24 MED ORDER — WARFARIN - PHYSICIAN DOSING INPATIENT: Freq: Every day | Status: DC

## 2011-08-24 NOTE — Progress Notes (Signed)
Patient ID: Peter Richards, male   DOB: 10/02/1928, 78 y.o.   MRN: [id number] BP 127/84  Pulse 68  Temp(Src) 98 F (36.7 C) (Oral)  Resp 16  SpO2 92% Alert and oriented x 4. Speech clear and fluent.  Moving all extremities well. Wound clean and dry.  DC tomorrow.

## 2011-08-24 NOTE — Progress Notes (Signed)
Transfer to 3000 via w/c

## 2011-08-24 NOTE — Progress Notes (Signed)
Pt received to room 3005 from MRI.

## 2011-08-24 NOTE — Progress Notes (Signed)
Utilization review completed. Juwan Vences, RN, BSN. 08/24/11  

## 2011-08-24 NOTE — Progress Notes (Signed)
Clinical Social Work Department BRIEF PSYCHOSOCIAL ASSESSMENT 08/24/2011  Patient:  Peter Richards, Peter Richards     Account Number:  [id number]     Admit date:  08/22/2011  Clinical Social Worker:  Pearson Forster  Date/Time:  08/24/2011 03:15 PM  Referred by:  Physician  Date Referred:  08/24/2011 Referred for  SNF Placement   Other Referral:   Patient from [facility]   Interview type:  Patient Other interview type:   Patient wife at bedside    PSYCHOSOCIAL DATA Living Status:  FACILITY Admitted from facility:  [name] Level of care:  Assisted Living Primary support name:  Peter Richards, Peter Richards  [phone] Primary support relationship to patient:  SPOUSE Degree of support available:   Strong    CURRENT CONCERNS Current Concerns  Post-Acute Placement   Other Concerns:   Patient from ALF and would like to return to same facility utilizing SNF services    SOCIAL WORK ASSESSMENT / PLAN Clinical Social Worker met with patient and patient wife at the beside to discuss plans and offer support.  Discussed PT recommendation of SNF placement for rehab.  Per patient, he is a resident at [facility], and would like to be placed there for his rehab.  CSW left message with facility to confirm that patient is from there and find out whether they would be able to offer the patient a bed in their SNF. CSW will facilitate patient discharge needs once patient is medically ready for discharge.   Assessment/plan status:  Psychosocial Support/Ongoing Assessment of Needs Other assessment/ plan:   Information/referral to community resources:   None at this time    PATIENT'S/FAMILY'S RESPONSE TO PLAN OF CARE: Patient was alert and oriented x3.  Patient and patient's wife expressed that they want patient to receive therapy at [facility].  Per patient, he called [facility] and they informed patient that they had two beds available.    Arnette Norris, MSW Intern  [place],  [place] [phone]

## 2011-08-24 NOTE — Progress Notes (Signed)
Report called to rn 3000,

## 2011-08-24 NOTE — Progress Notes (Signed)
Physical Therapy Treatment Patient Details Name: Peter Richards MRN: [id number] DOB: February 27, 1929 Today's Date: 08/24/2011  PT Assessment/Plan  PT - Assessment/Plan Comments on Treatment Session: Patient with increased pain with mobility today leading to difficulty with becoming more independent with functional mobility. His wife is not able to physically assist him at home, and therefore he will require short term rehab before transitioning back to independent living. PT Plan: Discharge plan remains appropriate;Frequency remains appropriate PT Frequency: Min 5X/week Follow Up Recommendations: Skilled nursing facility Equipment Recommended: Defer to next venue PT Goals  Acute Rehab PT Goals PT Goal: Sit to Supine/Side - Progress: Progressing toward goal PT Goal: Sit to Stand - Progress: Progressing toward goal PT Goal: Stand to Sit - Progress: Progressing toward goal PT Goal: Ambulate - Progress: Progressing toward goal  PT Treatment Precautions/Restrictions  Precautions Precautions: Back Precaution Booklet Issued: Yes (comment) Precaution Comments: Reviewed back precautions, good posture and body mechanics. Required Braces or Orthoses: No (Pt questioning if he would be more comfortable in his brace) Restrictions Weight Bearing Restrictions: No Mobility (including Balance) Bed Mobility Sit to Sidelying Left: 4: Min assist Sit to Sidelying Left Details (indicate cue type and reason): Patient able to verbalize correct technique but difficulty sequencing lower extremities and trunk without assistance. Transfers Sit to Stand: With upper extremity assist;4: Min assist;From bed Sit to Stand Details (indicate cue type and reason): Verbal cues for correct hand placement to and from device. Difficulty initiating without assistance but mid to end range patient with no assistance required. Stand to Sit: To bed;With upper extremity assist;4: Min assist Stand to Sit Details: Min-guard  assistance - cues only for hand placement Ambulation/Gait Ambulation/Gait Assistance: 4: Min assist Ambulation/Gait Assistance Details (indicate cue type and reason): Min-guard assistance - verbal cues for posture and improved step length/speed Ambulation Distance (Feet): 150 Feet Assistive device: Rolling walker Gait Pattern: Step-through pattern;Decreased stride length;Trunk flexed    Exercise    End of Session PT - End of Session Equipment Utilized During Treatment: Gait belt Activity Tolerance: Patient limited by pain Patient left: in bed;with call bell in reach Nurse Communication: Mobility status for transfers;Mobility status for ambulation General Behavior During Session: [facility] for tasks performed Cognition: [facility] for tasks performed  Edwyna Perfect, PT  Pager [phone]  08/24/2011, 10:04 AM

## 2011-08-25 LAB — URINE MICROSCOPIC-ADD ON

## 2011-08-25 LAB — URINALYSIS, ROUTINE W REFLEX MICROSCOPIC
Bilirubin Urine: NEGATIVE
Glucose, UA: NEGATIVE mg/dL
Ketones, ur: NEGATIVE mg/dL
Leukocytes, UA: NEGATIVE
Nitrite: POSITIVE — AB
Protein, ur: NEGATIVE mg/dL
Specific Gravity, Urine: 1.018 (ref 1.005–1.030)
Urobilinogen, UA: 0.2 mg/dL (ref 0.0–1.0)
pH: 6 (ref 5.0–8.0)

## 2011-08-25 LAB — PROTIME-INR
INR: 1.17 (ref 0.00–1.49)
Prothrombin Time: 15.1 s (ref 11.6–15.2)

## 2011-08-25 MED ORDER — OXYCODONE-ACETAMINOPHEN 5-325 MG PO TABS
2.0000 | ORAL_TABLET | ORAL | Status: DC | PRN
  Administered 2011-08-25 (×2): 2 via ORAL
  Filled 2011-08-25 (×2): qty 2

## 2011-08-25 MED ORDER — CIPROFLOXACIN IN D5W 400 MG/200ML IV SOLN
400.0000 mg | Freq: Once | INTRAVENOUS | Status: AC
  Administered 2011-08-25: 400 mg via INTRAVENOUS
  Filled 2011-08-25: qty 200

## 2011-08-25 MED ORDER — SULFAMETHOXAZOLE-TRIMETHOPRIM 800-160 MG PO TABS: 1.0000 | ORAL_TABLET | Freq: Two times a day (BID) | ORAL | Status: AC

## 2011-08-25 MED ORDER — OXYCODONE-ACETAMINOPHEN 5-325 MG PO TABS: 1.0000 | ORAL_TABLET | Freq: Four times a day (QID) | ORAL | Status: AC | PRN

## 2011-08-25 NOTE — Discharge Summary (Signed)
SL removed from R hand, catheter tip intact, no bleeding noted.  Pt given d/c instructions along with f/u apt to be made with Dr. Franky Macho and copy of chart with scripts for percocet and bactrim DS sent. Pt verbalized understanding of d/c instructions.  Pt assisted OOB to stretcher donning brace x 2 assists.  Tolerated well.  Pt d/c'd via stretcher/ambulance to [facility] for [employer].

## 2011-08-25 NOTE — Discharge Instructions (Signed)
Wear brace as you have been doing. After 2 weeks wear brace while driving or as a passenger.Vertebroplasty Care After A vertebroplasty is procedure that helps relieve pain caused by breaks (fractures) in the spine. A special kind of "bone cement" is put into the spine. The cement hardens quickly. It also helps the break stay in a steady state (stabilizes).  HOME CARE  Rest in your bed (bed rest) as told by your doctor.   Do not lift things over 8 pounds (3.6 kilograms) for the next few days.   Return to your normal routine as told by your doctor.   Only take medicine as told by your doctor.   Change bandages (dressings) as told by your doctor.  GET HELP RIGHT AWAY IF:   You are bleeding from the needle site.   You have puffiness (swelling) or redness at the needle site.   You are very sore near the needle site.   Yellowish white fluid (pus) is coming out of the needle site.   You have a temperature by mouth above 102 F (38.9 C), not controlled by medicine.   You have trouble breathing or you feel short of breath.   You throw up (vomit).   You feel sick to your stomach (nauseous).   You have pain or cramping in your belly (abdomen), and it will not stop.  If you go to the Emergency Room, tell the nurse that you had this procedure done. MAKE SURE YOU:  Understand these instructions.   Will watch your condition.   Will get help right away if you are not doing well or get worse.  Document Released: 08/15/2009 Document Revised: 05/10/2011 Document Reviewed: 10/25/2010 [facility] Patient Information 2012 [place], [place].

## 2011-08-25 NOTE — Progress Notes (Signed)
Physical Therapy Treatment Patient Details Name: IBRAHAM LEVI MRN: [id number] DOB: 1929-03-04 Today's Date: 08/25/2011  PT Assessment/Plan  PT - Assessment/Plan Comments on Treatment Session: Pt continues to need SNF for rehab prior to safe return to independent living. Discussed rehab with pt, addressed concerns about rehab and about his personal progression. Encouraged pt that he was making good strength and mobility gains.   PT Plan: Discharge plan remains appropriate;Frequency remains appropriate PT Frequency: Min 5X/week Follow Up Recommendations: Skilled nursing facility Equipment Recommended: Defer to next venue PT Goals  Acute Rehab PT Goals Pt will go Sit to Stand: with modified independence;with upper extremity assist PT Goal: Sit to Stand - Progress: Progressing toward goal Pt will go Stand to Sit: with modified independence;with upper extremity assist PT Goal: Stand to Sit - Progress: Progressing toward goal Pt will Ambulate: >150 feet;with modified independence PT Goal: Ambulate - Progress: Progressing toward goal  PT Treatment Precautions/Restrictions  Precautions Precautions: Back Precaution Booklet Issued: Yes (comment) Precaution Comments: Reviewed back precautions, good posture and body mechanics. Pt keeping lumbar corsett brace donned during session Required Braces or Orthoses: No (Pt questioning if he would be more comfortable in his brace) Restrictions Weight Bearing Restrictions: No Mobility (including Balance) Bed Mobility Bed Mobility: No (seated at start with nursing) Transfers Transfers: Yes Sit to Stand: With upper extremity assist;4: Min assist;From bed Sit to Stand Details (indicate cue type and reason): Verbal cues for correct hand placement to and from device. Pt unable to fully adhere to cues.  Stand to Sit: To bed;With upper extremity assist;4: Min assist Stand to Sit Details: Assist to lower safely, cues for appropriate UE placement. Pt  painful with movement.  Ambulation/Gait Ambulation/Gait: Yes Ambulation/Gait Assistance: 4: Min assist Ambulation/Gait Assistance Details (indicate cue type and reason): min-guard assist. verbal cues for posture and improved step length/speed. Verbal cues for improved Rt. LE clearance and heel strike, pt able to implement changes well. Pt tends to allow RW to advance too far ahead of self.  Ambulation Distance (Feet): 225 Feet Assistive device: Rolling walker Gait Pattern: Step-through pattern;Decreased stride length;Trunk flexed Stairs: No    End of Session PT - End of Session Equipment Utilized During Treatment: Gait belt;Back brace Activity Tolerance: Patient limited by pain;Other (comment) (catheter leak in hallway) Patient left: with call bell in reach;in chair;with family/visitor present Nurse Communication: Mobility status for ambulation General Behavior During Session: [facility] for tasks performed Cognition: [facility] for tasks performed  Wilhemina Bonito 08/25/2011, 3:21 PM  Sherie Don) Carleene Mains PT, DPT Acute Rehabilitation [phone]

## 2011-08-25 NOTE — Discharge Summary (Signed)
Physician Discharge Summary  Patient ID: Peter Richards MRN: [id number] DOB/AGE: April 12, 1929 78 y.o.  Admit date: 08/22/2011 Discharge date: 08/25/2011  Admission Diagnoses:  Discharge Diagnoses:  Principal Problem:  *Lumbar compression fracture   Discharged Condition: good  Hospital Course: mr. Mcmanaman was admitted and underwent an uncomplicated kyphoplasty. Postoperatively he has had a good deal of pain. He would like an inpatient rehabilitation stay at [facility] his assisted living facility. At discharge his wound is clean, dry and without signs of infection.   Consults: None  Significant Diagnostic Studies: microbiology: urine culture: pending  Treatments: surgery: Kyphoplasty  Discharge Exam: Blood pressure 119/74, pulse 62, temperature 97.6 F (36.4 C), temperature source Oral, resp. rate 20, height 5\' 8"  (1.727 m), weight 85.866 kg (189 lb 4.8 oz), SpO2 92.00%. General appearance: alert, cooperative and appears stated age Neurologic: Alert and oriented X 3, normal strength and tone. Normal symmetric reflexes. Normal coordination and gait  Disposition:   Discharge Orders    Future Appointments: Provider: Department: Dept Phone: Center:   08/30/2011 9:45 AM Cassell Clement, MD [facility] [phone] None   08/30/2011 10:00 AM [facility] [employer] [phone] [facility]   08/30/2011 1:20 PM [facility] [phone] [facility]     Medication List  As of 08/25/2011  8:39 AM   TAKE these medications         amiodarone 200 MG tablet   Commonly known as: PACERONE   Take 100 mg by mouth daily.      CALCIUM + D PO   Take 1 tablet by mouth daily.      escitalopram 20 MG tablet   Commonly known as: LEXAPRO   Take 20 mg by mouth daily.      gabapentin 300 MG capsule   Commonly known as: NEURONTIN   Take 100 mg by mouth daily.      HYDROcodone-acetaminophen 5-325 MG per tablet   Commonly known as: NORCO     Take 1 tablet by mouth every 6 (six) hours as needed. For pain      metoprolol succinate 25 MG 24 hr tablet   Commonly known as: TOPROL-XL   Take 25 mg by mouth daily.      oxyCODONE-acetaminophen 5-325 MG per tablet   Commonly known as: PERCOCET   Take 1 tablet by mouth every 6 (six) hours as needed. For pain      polyethylene glycol packet   Commonly known as: MIRALAX / GLYCOLAX   Take 17 g by mouth daily as needed. For constipation      simvastatin 10 MG tablet   Commonly known as: ZOCOR   Take 10 mg by mouth at bedtime.      warfarin 5 MG tablet   Commonly known as: COUMADIN   Take 5 mg by mouth daily.      zolpidem 10 MG tablet   Commonly known as: AMBIEN   Take 5 mg by mouth at bedtime as needed and may repeat dose one time if needed. Taking 1/2 tablet           Follow-up Information    Follow up with Lionel Woodberry L, MD in 1 month. (call for appt)    Contact information:   [address], [address] [place] [address] [phone]          Signed: Carmela Hurt 08/25/2011, 8:39 AM

## 2011-08-25 NOTE — Anesthesia Postprocedure Evaluation (Signed)
  Anesthesia Post-op Note  Patient: Peter Richards  Procedure(s) Performed: Procedure(s) (LRB): VERTEBROPLASTY (N/A)  Patient Location: PACU  Anesthesia Type: General  Level of Consciousness: awake  Airway and Oxygen Therapy: Patient Spontanous Breathing and Patient connected to nasal cannula oxygen  Post-op Pain: mild  Post-op Assessment: Post-op Vital signs reviewed  Post-op Vital Signs: Reviewed  Complications: No apparent anesthesia complications

## 2011-08-25 NOTE — Progress Notes (Signed)
Pt to transfer to [facility] at [facility] today via [place]. Pt, pt's wife, and SNF aware of d/c. No other CSW needs reported or noted. CSW signing off.  Dellie Burns, MSW, [place] [phone] (weekend)

## 2011-08-26 LAB — URINE CULTURE: Colony Count: >=100000

## 2011-08-28 NOTE — Progress Notes (Signed)
LATE ENTRY  Clinical Social Work Department CLINICAL SOCIAL WORK PLACEMENT NOTE 08/28/2011  Patient:  Peter Richards, Peter Richards  Account Number:  [id number] Admit date:  08/22/2011  Clinical Social Worker:  Peggyann Shoals  Date/time:  08/28/2011 01:55 PM  Clinical Social Work is seeking post-discharge placement for this patient at the following level of care:   SKILLED NURSING   (*CSW will update this form in Epic as items are completed)   08/24/2011  Patient/family provided with [facility] Department of Clinical Social Work's list of facilities offering this level of care within the geographic area requested by the patient (or if unable, by the patient's family).  08/24/2011  Patient/family informed of their freedom to choose among providers that offer the needed level of care, that participate in Medicare, Medicaid or managed care program needed by the patient, have an available bed and are willing to accept the patient.  08/24/2011  Patient/family informed of [facility]' ownership interest in [facility], as well as of the fact that they are under no obligation to receive care at this facility.  PASARR submitted to EDS on  PASARR number received from EDS on   FL2 transmitted to all facilities in geographic area requested by pt/family on  08/24/2011 FL2 transmitted to all facilities within larger geographic area on   Patient informed that his/her managed care company has contracts with or will negotiate with  certain facilities, including the following:     Patient/family informed of bed offers received:  08/25/2011 Patient chooses bed at [facility] Physician recommends and patient chooses bed at  [facility]  Patient to be transferred to [name] on  08/25/2011 Patient to be transferred to facility by [facility]  The following physician request were entered in Epic:   Additional Comments: Existing PASARR

## 2011-08-30 ENCOUNTER — Other Ambulatory Visit: Payer: Medicare Other

## 2011-08-30 ENCOUNTER — Ambulatory Visit: Payer: Medicare Other | Admitting: Cardiology

## 2011-09-10 ENCOUNTER — Ambulatory Visit (INDEPENDENT_AMBULATORY_CARE_PROVIDER_SITE_OTHER): Payer: Medicare Other | Admitting: *Deleted

## 2011-09-10 ENCOUNTER — Encounter: Payer: Self-pay | Admitting: Internal Medicine

## 2011-09-10 DIAGNOSIS — I4891 Unspecified atrial fibrillation: Secondary | ICD-10-CM

## 2011-09-10 DIAGNOSIS — Z950 Presence of cardiac pacemaker: Secondary | ICD-10-CM

## 2011-09-10 DIAGNOSIS — I498 Other specified cardiac arrhythmias: Secondary | ICD-10-CM

## 2011-09-11 ENCOUNTER — Ambulatory Visit (INDEPENDENT_AMBULATORY_CARE_PROVIDER_SITE_OTHER): Payer: Medicare Other | Admitting: Cardiovascular Disease

## 2011-09-11 DIAGNOSIS — I4891 Unspecified atrial fibrillation: Secondary | ICD-10-CM

## 2011-09-11 DIAGNOSIS — R0989 Other specified symptoms and signs involving the circulatory and respiratory systems: Secondary | ICD-10-CM

## 2011-09-11 LAB — POCT INR
INR: 1.4
INR: 1.4

## 2011-09-11 LAB — REMOTE PACEMAKER DEVICE
AL IMPEDENCE PM: 505 Ohm
BAMS-0001: 175 {beats}/min
RV LEAD AMPLITUDE: 22.4 mv
RV LEAD IMPEDENCE PM: 520 Ohm
VENTRICULAR PACING PM: 0

## 2011-09-18 ENCOUNTER — Ambulatory Visit: Payer: Self-pay | Admitting: Cardiovascular Disease

## 2011-09-18 DIAGNOSIS — I4891 Unspecified atrial fibrillation: Secondary | ICD-10-CM

## 2011-09-19 ENCOUNTER — Ambulatory Visit (INDEPENDENT_AMBULATORY_CARE_PROVIDER_SITE_OTHER): Payer: Medicare Other | Admitting: Cardiology

## 2011-09-19 ENCOUNTER — Encounter: Payer: Self-pay | Admitting: Cardiology

## 2011-09-19 VITALS — BP 118/80 | HR 80 | Ht 68.0 in | Wt 185.0 lb

## 2011-09-19 DIAGNOSIS — M549 Dorsalgia, unspecified: Secondary | ICD-10-CM

## 2011-09-19 DIAGNOSIS — E785 Hyperlipidemia, unspecified: Secondary | ICD-10-CM

## 2011-09-19 DIAGNOSIS — R55 Syncope and collapse: Secondary | ICD-10-CM

## 2011-09-19 DIAGNOSIS — I4891 Unspecified atrial fibrillation: Secondary | ICD-10-CM

## 2011-09-19 DIAGNOSIS — I480 Paroxysmal atrial fibrillation: Secondary | ICD-10-CM

## 2011-09-19 DIAGNOSIS — I2510 Atherosclerotic heart disease of native coronary artery without angina pectoris: Secondary | ICD-10-CM

## 2011-09-19 MED ORDER — NITROGLYCERIN 0.4 MG SL SUBL: 0.4000 mg | SUBLINGUAL_TABLET | SUBLINGUAL | Status: DC | PRN

## 2011-09-19 NOTE — Assessment & Plan Note (Signed)
The patient is on simvastatin 10 mg daily for his dyslipidemia.  Will plan to check his lipids at his next office visit.  He has not had any myalgias from the statin therapy

## 2011-09-19 NOTE — Progress Notes (Signed)
Peter Richards Date of Birth:  10/12/1928 [facility] [address] [place], [place]  [address] [phone]         Fax   [phone]  History of Present Illness: This pleasant 78 year old gentleman is seen for a scheduled followup office visit.  He has a history of tachybradycardia syndrome and paroxysmal atrial fibrillation.  He has a dual-chamber pacemaker.  His pacemaker is followed by Dr. Ladona Richards.  He is not having any symptoms from his heart recently.  He has a history of hypercholesterolemia.  The patient is on long-term Coumadin anticoagulation because of his paroxysmal A. fib.  He is maintaining normal sinus rhythm on low dose amiodarone 100 mg daily.  Since her last saw him he had back surgery in March 2013 by Dr. Mikal Richards.  He had a vertebral plasty of his back for a spontaneous fracture.  He spent 4 days in the hospital and then went to rehabilitation at [name] for 10 days and is now back in his apartment getting physical therapy 3 times a week.  He is still having moderate back pain and is wearing a back brace and he walks with a cane.  Current Outpatient Prescriptions  Medication Sig Dispense Refill  . amiodarone (PACERONE) 200 MG tablet Take 100 mg by mouth daily.       . Calcium Carbonate-Vitamin D (CALCIUM + D PO) Take 1 tablet by mouth daily.      [name] escitalopram (LEXAPRO) 20 MG tablet Take 20 mg by mouth daily.       [name] gabapentin (NEURONTIN) 300 MG capsule Take 100 mg by mouth daily.       [name] HYDROcodone-acetaminophen (NORCO) 5-325 MG per tablet Take 1 tablet by mouth every 6 (six) hours as needed. For pain      . metoprolol succinate (TOPROL-XL) 25 MG 24 hr tablet Take 25 mg by mouth daily.      . polyethylene glycol (MIRALAX / GLYCOLAX) packet Take 17 g by mouth daily as needed. For constipation      . simvastatin (ZOCOR) 10 MG tablet Take 10 mg by mouth at bedtime.      [name] warfarin (COUMADIN) 5 MG tablet Take 5 mg by mouth daily.      [name] zolpidem  (AMBIEN) 10 MG tablet Take 5 mg by mouth at bedtime as needed and may repeat dose one time if needed. Taking 1/2 tablet      . nitroGLYCERIN (NITROSTAT) 0.4 MG SL tablet Place 1 tablet (0.4 mg total) under the tongue every 5 (five) minutes as needed for chest pain.  25 tablet  prn    Allergies  Allergen Reactions  . Crestor (Rosuvastatin Calcium)     Leg cramps  . Codeine Nausea And Vomiting    Patient Active Problem List  Diagnoses  . HYPERLIPIDEMIA  . CORONARY ARTERY DISEASE  . BRADYCARDIA  . HYPOTENSION, ORTHOSTATIC  . SYNCOPE  . CHEST PAIN  . ABDOMINAL AORTIC ANEURYSM, HX OF  . ATRIAL FIBRILLATION, HX OF  . PACEMAKER, PERMANENT  . Atrial fibrillation  . A-fib  . Hx of bladder cancer  . Dyslipidemia  . Low back pain  . Lumbar compression fracture    History  Smoking status  . Never Smoker   Smokeless tobacco  . Not on file    History  Alcohol Use No    No family history on file.  Review of Systems: Constitutional: no fever chills diaphoresis or fatigue or change in weight.  Head and neck: no hearing loss, no epistaxis, no photophobia or visual disturbance. Respiratory: No cough, shortness of breath or wheezing. Cardiovascular: No chest pain peripheral edema, palpitations. Gastrointestinal: No abdominal distention, no abdominal pain, no change in bowel habits hematochezia or melena. Genitourinary: No dysuria, no frequency, no urgency, no nocturia. Musculoskeletal:No arthralgias, no back pain, no gait disturbance or myalgias. Neurological: No dizziness, no headaches, no numbness, no seizures, no syncope, no weakness, no tremors. Hematologic: No lymphadenopathy, no easy bruising. Psychiatric: No confusion, no hallucinations, no sleep disturbance.    Physical Exam: Filed Vitals:   09/19/11 1113  BP: 118/80  Pulse: 80   the general appearance reveals a well-developed well-nourished elderly gentleman who walks with a cane.Pupils equal and reactive.    Extraocular Movements are full.  There is no scleral icterus.  The mouth and pharynx are normal.  The neck is supple.  The carotids reveal no bruits.  The jugular venous pressure is normal.  The thyroid is not enlarged.  There is no lymphadenopathy.  The chest is clear to percussion and auscultation. There are no rales or rhonchi. Expansion of the chest is symmetrical.  The precordium is quiet.  The first heart sound is normal.  The second heart sound is physiologically split.  There is no murmur gallop rub or click.  There is no abnormal lift or heave.  The abdomen is soft and nontender. Bowel sounds are normal. The liver and spleen are not enlarged. There Are no abdominal masses. There are no bruits.  He has a ureteral conduit in the left lower quadrant.  The pedal pulses are good.  There is no phlebitis or edema.  There is no cyanosis or clubbing. Strength is normal and symmetrical in all extremities.  There is no lateralizing weakness.  There are no sensory deficits.  The skin is warm and dry.  There is no rash.     Assessment / Plan: Continue same medication.  Recheck in 4 months for followup office visit lipid panel hepatic function panel and basal metabolic panel

## 2011-09-19 NOTE — Assessment & Plan Note (Signed)
The patient has not been experiencing any chest pain or angina.  His nitroglycerin are old and we called in a prescription for him today.

## 2011-09-19 NOTE — Assessment & Plan Note (Signed)
The patient has not been experiencing any dizziness or syncope.  He has not been aware of any tachycardia.

## 2011-09-19 NOTE — Patient Instructions (Signed)
Your physician recommends that you continue on your current medications as directed. Please refer to the Current Medication list given to you today.  Your physician recommends that you schedule a follow-up appointment in: 4 months  

## 2011-09-20 ENCOUNTER — Encounter: Payer: Self-pay | Admitting: *Deleted

## 2011-09-20 NOTE — Progress Notes (Signed)
Remote pacer check  

## 2011-10-02 ENCOUNTER — Ambulatory Visit: Payer: Self-pay | Admitting: Cardiology

## 2011-10-02 DIAGNOSIS — I4891 Unspecified atrial fibrillation: Secondary | ICD-10-CM

## 2011-10-02 LAB — POCT INR: INR: 3.1

## 2011-10-16 ENCOUNTER — Ambulatory Visit: Payer: Self-pay | Admitting: Internal Medicine

## 2011-10-16 DIAGNOSIS — I4891 Unspecified atrial fibrillation: Secondary | ICD-10-CM

## 2011-10-16 LAB — POCT INR: INR: 2.7

## 2011-11-02 ENCOUNTER — Other Ambulatory Visit: Payer: Self-pay | Admitting: Cardiology

## 2011-11-02 ENCOUNTER — Telehealth: Payer: Self-pay | Admitting: Cardiology

## 2011-11-02 NOTE — Telephone Encounter (Signed)
Please return call to patient at [phone] regarding Warfarin RX renewal

## 2011-11-02 NOTE — Telephone Encounter (Signed)
Spoke with patient and advised Rx sent to [facility] today

## 2011-11-06 ENCOUNTER — Ambulatory Visit: Payer: Self-pay | Admitting: Cardiovascular Disease

## 2011-11-06 DIAGNOSIS — I4891 Unspecified atrial fibrillation: Secondary | ICD-10-CM

## 2011-11-06 LAB — POCT INR: INR: 3.7

## 2011-11-20 ENCOUNTER — Ambulatory Visit: Payer: Self-pay | Admitting: Pharmacist

## 2011-11-20 DIAGNOSIS — I4891 Unspecified atrial fibrillation: Secondary | ICD-10-CM

## 2011-11-20 LAB — POCT INR: INR: 2.7

## 2011-11-28 ENCOUNTER — Telehealth: Payer: Self-pay | Admitting: Cardiology

## 2011-11-28 NOTE — Telephone Encounter (Signed)
Pt will be having a injection in his back and needs to be off coumadin for 7-10 days pt will have this done by Dr. Wilhelmenia Blase date TBA

## 2011-11-28 NOTE — Telephone Encounter (Signed)
Spoke with [name] and they want to hold 7 days prior and 1 day post secondary to being close to the spine.  Will forward to  Dr. Patty Sermons for review

## 2011-11-28 NOTE — Telephone Encounter (Signed)
Okay to hold 7 days (he is maintaining sinus rhythm)

## 2011-11-29 NOTE — Telephone Encounter (Signed)
Will fax over ok

## 2011-12-11 ENCOUNTER — Ambulatory Visit: Payer: Self-pay | Admitting: Cardiology

## 2011-12-11 DIAGNOSIS — I4891 Unspecified atrial fibrillation: Secondary | ICD-10-CM

## 2011-12-11 LAB — POCT INR: INR: 2.2

## 2012-01-10 NOTE — Telephone Encounter (Signed)
Called and spoke with Herbert Seta at [facility] trying to get INR reading, this patient is presently off coumadin for procedure to back and will not have INR done this week, he has been instructed to stop coumadin 7 days prior to procedure, he has a f/u INR due on Monday 01/14/2012, Herbert Seta is not exactly sure what date the procedure is for next week but thinks it is on Tuesday 01/15/2012. [name] is aware of this procedure and coumadin instructions.

## 2012-01-11 ENCOUNTER — Encounter: Payer: Self-pay | Admitting: Cardiology

## 2012-01-11 ENCOUNTER — Ambulatory Visit (INDEPENDENT_AMBULATORY_CARE_PROVIDER_SITE_OTHER): Payer: Medicare Other | Admitting: Internal Medicine

## 2012-01-11 ENCOUNTER — Encounter: Payer: Self-pay | Admitting: Internal Medicine

## 2012-01-11 ENCOUNTER — Other Ambulatory Visit (INDEPENDENT_AMBULATORY_CARE_PROVIDER_SITE_OTHER): Payer: Medicare Other

## 2012-01-11 VITALS — BP 144/102 | HR 87 | Ht 68.0 in | Wt 180.1 lb

## 2012-01-11 DIAGNOSIS — I2510 Atherosclerotic heart disease of native coronary artery without angina pectoris: Secondary | ICD-10-CM

## 2012-01-11 DIAGNOSIS — I4891 Unspecified atrial fibrillation: Secondary | ICD-10-CM

## 2012-01-11 DIAGNOSIS — I498 Other specified cardiac arrhythmias: Secondary | ICD-10-CM

## 2012-01-11 DIAGNOSIS — Z950 Presence of cardiac pacemaker: Secondary | ICD-10-CM

## 2012-01-11 LAB — PACEMAKER DEVICE OBSERVATION
AL AMPLITUDE: 4 mv
AL IMPEDENCE PM: 512 Ohm
AL THRESHOLD: 0.625 V
BATTERY VOLTAGE: 2.79 V
RV LEAD AMPLITUDE: 11.2 mv
RV LEAD IMPEDENCE PM: 502 Ohm

## 2012-01-11 LAB — LDL CHOLESTEROL, DIRECT: Direct LDL: 63 mg/dL

## 2012-01-11 LAB — BASIC METABOLIC PANEL
BUN: 19 mg/dL (ref 6–23)
CO2: 26 mEq/L (ref 19–32)
Chloride: 103 mEq/L (ref 96–112)
Creatinine, Ser: 1 mg/dL (ref 0.4–1.5)
Potassium: 4.5 mEq/L (ref 3.5–5.1)

## 2012-01-11 LAB — LIPID PANEL
HDL: 43.8 mg/dL (ref >39.00)
Total CHOL/HDL Ratio: 3
VLDL: 48.4 mg/dL — ABNORMAL HIGH (ref 0.0–40.0)

## 2012-01-11 LAB — HEPATIC FUNCTION PANEL
Alkaline Phosphatase: 71 U/L (ref 39–117)
Bilirubin, Direct: 0 mg/dL (ref 0.0–0.3)

## 2012-01-11 NOTE — Progress Notes (Signed)
Quick Note:  Please make copy of labs for patient visit. ______ 

## 2012-01-11 NOTE — Assessment & Plan Note (Signed)
Review of his histograms demonstrates that he is maintaining sinus rhythm 99.9% of the time.

## 2012-01-11 NOTE — Assessment & Plan Note (Signed)
He denies anginal symptoms. He will continue his current medical therapy. 

## 2012-01-11 NOTE — Progress Notes (Signed)
HPI Mr. Whilden returns today for followup. He is a very pleasant 78 year old man with an ischemic heart disease, symptomatic bradycardia, status post permanent pacemaker insertion. He has hypertension. He denies chest pain. He denies peripheral edema. He notes shortness of breath. He denies wheezes or coughing. Allergies  Allergen Reactions  . Crestor (Rosuvastatin Calcium)     Leg cramps  . Codeine Nausea And Vomiting     Current Outpatient Prescriptions  Medication Sig Dispense Refill  . amiodarone (PACERONE) 200 MG tablet Take 100 mg by mouth daily.       . Calcium Carbonate-Vitamin D (CALCIUM + D PO) Take 1 tablet by mouth daily.      [name] escitalopram (LEXAPRO) 20 MG tablet Take 20 mg by mouth daily.       [name] gabapentin (NEURONTIN) 300 MG capsule Take 100 mg by mouth daily.       [name] HYDROcodone-acetaminophen (NORCO) 5-325 MG per tablet Take 1 tablet by mouth every 6 (six) hours as needed. For pain      . metoprolol succinate (TOPROL-XL) 25 MG 24 hr tablet Take 25 mg by mouth daily.      . nitroGLYCERIN (NITROSTAT) 0.4 MG SL tablet Place 1 tablet (0.4 mg total) under the tongue every 5 (five) minutes as needed for chest pain.  25 tablet  prn  . polyethylene glycol (MIRALAX / GLYCOLAX) packet Take 17 g by mouth daily as needed. For constipation      . simvastatin (ZOCOR) 10 MG tablet Take 10 mg by mouth at bedtime.      [name] warfarin (COUMADIN) 5 MG tablet Take as directed by coumadin clinic  95 tablet  0  . zolpidem (AMBIEN) 10 MG tablet Take 5 mg by mouth at bedtime as needed and may repeat dose one time if needed. Taking 1/2 tablet         Past Medical History  Diagnosis Date  . Hyperlipidemia   . History of abdominal aortic aneurysm   . History of atrial fibrillation   . Orthostatic hypotension   . Presence of permanent cardiac pacemaker   . Bradycardia   . Syncope   . Myocardial infarction     1981  . CAD (coronary artery disease)     DR BRACKBILL   . Hypertension   .  Dysrhythmia   . Stroke     TIA  2007    . Shortness of breath     SINCE FALL 3 WEEKS AGOCHEST AND RIBS SORE  . No pertinent past medical history     COUGH   . Cancer   . GERD (gastroesophageal reflux disease)   . Headache   . Arthritis     OSTEOPOROSIS  . Aortic stenosis, mild     by 01/08/08 echo    ROS:   All systems reviewed and negative except as noted in the HPI.   Past Surgical History  Procedure Date  . Pacemaker insertion   . Insert / replace / remove pacemaker     11/2009  DR BRODIE    . Coronary artery bypass graft     1999  . No past surgeries   . Prostatectomy     BLADDER ALSO REMOVED (OSTOMY BAG)  2006  FOR CANCER     . Abdominal aortic aneurysm repair     1999 STENT PLACED    . Cholecystectomy     4 YRS AGO  . Cataract extraction w/ intraocular lens  implant, bilateral   . Appendectomy  1959  . Revision urostomy cutaneous   . Revision urostomy cutaneous 2006    Urostomy bag placed on right lower abdomen  . Vertebroplasty 08/22/2011    Procedure: VERTEBROPLASTY;  Surgeon: Carmela Hurt, MD;  Location: [facility];  Service: Neurosurgery;  Laterality: N/A;  Lumbar Two vertebroplasty     No family history on file.   History   Social History  . Marital Status: Married    Spouse Name: N/A    Number of Children: N/A  . Years of Education: N/A   Occupational History  . Not on file.   Social History Main Topics  . Smoking status: Never Smoker   . Smokeless tobacco: Never Used  . Alcohol Use: No  . Drug Use: No  . Sexually Active: Not on file   Other Topics Concern  . Not on file   Social History Narrative  . No narrative on file     BP 144/102  Pulse 87  Ht 5\' 8"  (1.727 m)  Wt 180 lb 1.9 oz (81.702 kg)  BMI 27.39 kg/m2  Physical Exam:  Well appearing elderly man, NAD HEENT: Unremarkable Neck:  No JVD, no thyromegally Lungs:  Clear with no wheezes, rales, or rhonchi. HEART:  Regular rate rhythm, no murmurs, no rubs, no  clicks Abd:  soft, positive bowel sounds, no organomegally, no rebound, no guarding Ext:  2 plus pulses, no edema, no cyanosis, no clubbing Skin:  No rashes no nodules Neuro:  CN II through XII intact, motor grossly intact  DEVICE  Normal device function.  See PaceArt for details.   Assess/Plan:

## 2012-01-11 NOTE — Patient Instructions (Addendum)
Your physician wants you to follow-up in: 12 months with Dr Taylor You will receive a reminder letter in the mail two months in advance. If you don't receive a letter, please call our office to schedule the follow-up appointment.  Remote monitoring is used to monitor your Pacemaker of ICD from home. This monitoring reduces the number of office visits required to check your device to one time per year. It allows us to keep an eye on the functioning of your device to ensure it is working properly. You are scheduled for a device check from home on 04/14/12. You may send your transmission at any time that day. If you have a wireless device, the transmission will be sent automatically. After your physician reviews your transmission, you will receive a postcard with your next transmission date.    

## 2012-01-11 NOTE — Assessment & Plan Note (Signed)
His device is working normally. We'll plan to recheck his Medtronic dual-chamber pacemaker in several months.

## 2012-01-14 ENCOUNTER — Ambulatory Visit: Payer: Self-pay | Admitting: Cardiovascular Disease

## 2012-01-14 DIAGNOSIS — I4891 Unspecified atrial fibrillation: Secondary | ICD-10-CM

## 2012-01-14 LAB — POCT INR: INR: 1.1

## 2012-01-22 ENCOUNTER — Ambulatory Visit (INDEPENDENT_AMBULATORY_CARE_PROVIDER_SITE_OTHER): Payer: Medicare Other | Admitting: Cardiology

## 2012-01-22 ENCOUNTER — Ambulatory Visit: Payer: Self-pay | Admitting: Cardiology

## 2012-01-22 ENCOUNTER — Encounter: Payer: Self-pay | Admitting: Cardiology

## 2012-01-22 VITALS — BP 126/70 | HR 76 | Resp 18 | Ht 69.0 in | Wt 185.6 lb

## 2012-01-22 DIAGNOSIS — R0609 Other forms of dyspnea: Secondary | ICD-10-CM

## 2012-01-22 DIAGNOSIS — E785 Hyperlipidemia, unspecified: Secondary | ICD-10-CM

## 2012-01-22 DIAGNOSIS — I4891 Unspecified atrial fibrillation: Secondary | ICD-10-CM

## 2012-01-22 DIAGNOSIS — I480 Paroxysmal atrial fibrillation: Secondary | ICD-10-CM

## 2012-01-22 DIAGNOSIS — I2510 Atherosclerotic heart disease of native coronary artery without angina pectoris: Secondary | ICD-10-CM

## 2012-01-22 DIAGNOSIS — J849 Interstitial pulmonary disease, unspecified: Secondary | ICD-10-CM | POA: Insufficient documentation

## 2012-01-22 DIAGNOSIS — Z8679 Personal history of other diseases of the circulatory system: Secondary | ICD-10-CM

## 2012-01-22 DIAGNOSIS — E7800 Pure hypercholesterolemia, unspecified: Secondary | ICD-10-CM

## 2012-01-22 DIAGNOSIS — J8410 Pulmonary fibrosis, unspecified: Secondary | ICD-10-CM

## 2012-01-22 NOTE — Assessment & Plan Note (Signed)
The patient does have exertional dyspnea.  His x-ray a year ago showed evidence of worsening interstitial lung disease.  He has a history of chronic exposure to dust and rock dust.  He used to work in a quarry.

## 2012-01-22 NOTE — Assessment & Plan Note (Signed)
The patient has a past history of atrial fibrillation.  Review of his histograms shows that he is maintaining normal sinus rhythm 99.9% of the time.

## 2012-01-22 NOTE — Patient Instructions (Addendum)
Your physician recommends that you continue on your current medications as directed. Please refer to the Current Medication list given to you today.  Your physician recommends that you schedule a follow-up appointment in: 4 months with fasting labs (lp/bmet/hfp)  

## 2012-01-22 NOTE — Assessment & Plan Note (Signed)
The patient has not been experiencing any angina pectoris. 

## 2012-01-22 NOTE — Assessment & Plan Note (Signed)
The patient has a history of dyslipidemia and is on simvastatin 10 mg daily.  He has not been having any side effects from the simvastatin.

## 2012-01-22 NOTE — Progress Notes (Signed)
Peter Richards Date of Birth:  06/04/1929 [name] [address] [place], [place]  [address] [phone]         Fax   [phone]  History of Present Illness: This pleasant 78 year old gentleman is seen for a scheduled followup office visit. He has a history of tachybradycardia syndrome and paroxysmal atrial fibrillation. He has a dual-chamber pacemaker. His pacemaker is followed by Dr. [name]. He is not having any symptoms from his heart recently. He has a history of hypercholesterolemia. The patient is on long-term Coumadin anticoagulation because of his paroxysmal A. fib. He is maintaining normal sinus rhythm on low dose amiodarone 100 mg daily. Since he had back surgery in March 2013 by Dr. [name]. He had a vertebral plasty of his back for a spontaneous fracture. He spent 4 days in the hospital and then went to rehabilitation at [name] for 10 days and is now back in his apartment getting physical therapy 3 times a week. He is still having moderate back pain and is wearing a back brace and he walks with a cane. The patient is now attending a pain control center because of his back pain and his leg pain.  He has a burning discomfort in both legs which is less when he lies down or rests.  He had a recent treatment with what he described as a burning needle treatment and he was off his Coumadin for a week prior to that.   Current Outpatient Prescriptions  Medication Sig Dispense Refill  . amiodarone (PACERONE) 200 MG tablet Take 100 mg by mouth daily.       . Calcium Carbonate-Vitamin D (CALCIUM + D PO) Take 1 tablet by mouth daily.      [name] escitalopram (LEXAPRO) 20 MG tablet Take 20 mg by mouth daily.       [name] gabapentin (NEURONTIN) 300 MG capsule Take 100 mg by mouth daily.       [name] HYDROcodone-acetaminophen (NORCO) 5-325 MG per tablet Take 1 tablet by mouth every 6 (six) hours as needed. For pain      . metoprolol succinate (TOPROL-XL) 25 MG 24 hr tablet Take  25 mg by mouth daily.      . nitroGLYCERIN (NITROSTAT) 0.4 MG SL tablet Place 1 tablet (0.4 mg total) under the tongue every 5 (five) minutes as needed for chest pain.  25 tablet  prn  . polyethylene glycol (MIRALAX / GLYCOLAX) packet Take 17 g by mouth daily as needed. For constipation      . simvastatin (ZOCOR) 10 MG tablet Take 10 mg by mouth at bedtime.      [name] warfarin (COUMADIN) 5 MG tablet Take as directed by coumadin clinic  95 tablet  0  . zolpidem (AMBIEN) 10 MG tablet Take 5 mg by mouth at bedtime as needed and may repeat dose one time if needed. Taking 1/2 tablet        Allergies  Allergen Reactions  . Crestor (Rosuvastatin Calcium)     Leg cramps  . Codeine Nausea And Vomiting    Patient Active Problem List  Diagnosis  . HYPERLIPIDEMIA  . CORONARY ARTERY DISEASE  . BRADYCARDIA  . HYPOTENSION, ORTHOSTATIC  . SYNCOPE  . CHEST PAIN  . ABDOMINAL AORTIC ANEURYSM, HX OF  . ATRIAL FIBRILLATION, HX OF  . PACEMAKER, PERMANENT  . Atrial fibrillation  . A-fib  . Hx of bladder cancer  . Dyslipidemia  . Low back pain  . Lumbar compression fracture  .  Interstitial lung disease    History  Smoking status  . Never Smoker   Smokeless tobacco  . Never Used    History  Alcohol Use No    No family history on file.  Review of Systems: Constitutional: no fever chills diaphoresis or fatigue or change in weight.  Head and neck: no hearing loss, no epistaxis, no photophobia or visual disturbance. Respiratory: No cough, shortness of breath or wheezing. Cardiovascular: No chest pain peripheral edema, palpitations. Gastrointestinal: No abdominal distention, no abdominal pain, no change in bowel habits hematochezia or melena. Genitourinary: No dysuria, no frequency, no urgency, no nocturia. Musculoskeletal:No arthralgias, no back pain, no gait disturbance or myalgias. Neurological: No dizziness, no headaches, no numbness, no seizures, no syncope, no weakness, no  tremors. Hematologic: No lymphadenopathy, no easy bruising. Psychiatric: No confusion, no hallucinations, no sleep disturbance.    Physical Exam: Filed Vitals:   01/22/12 0858  BP: 126/70  Pulse: 76  Resp: 18   the general appearance reveals a well-developed well-nourished gentleman in no distress.The head and neck exam reveals pupils equal and reactive.  Extraocular movements are full.  There is no scleral icterus.  The mouth and pharynx are normal.  The neck is supple.  The carotids reveal no bruits.  The jugular venous pressure is normal.  The  thyroid is not enlarged.  There is no lymphadenopathy.  The chest is clear to percussion and auscultation.  There are no rales or rhonchi.  Expansion of the chest is symmetrical.  The precordium is quiet.  The first heart sound is normal.  The second heart sound is physiologically split.  There is no murmur gallop rub or click.  There is no abnormal lift or heave.  The abdomen is soft and nontender.  The bowel sounds are normal.  The liver and spleen are not enlarged.  There are no abdominal masses.  He has an ileostomy bag in the right lower quadrant. There are no abdominal bruits.  Extremities reveal good pedal pulses.  There is no phlebitis or edema.  There is no cyanosis or clubbing.  Strength is normal and symmetrical in all extremities.  There is no lateralizing weakness.  There are no sensory deficits.  The skin is warm and dry.  There is no rash.     Assessment / Plan:  The patient is to continue same medication.  We will plan to see him again in 4 months for office visit lipid panel hepatic function panel and basal metabolic panel

## 2012-01-29 ENCOUNTER — Ambulatory Visit: Payer: Self-pay | Admitting: Cardiology

## 2012-01-29 DIAGNOSIS — I4891 Unspecified atrial fibrillation: Secondary | ICD-10-CM

## 2012-01-29 LAB — POCT INR: INR: 1.9

## 2012-02-12 ENCOUNTER — Ambulatory Visit: Payer: Self-pay | Admitting: Cardiology

## 2012-02-12 ENCOUNTER — Other Ambulatory Visit: Payer: Self-pay | Admitting: Cardiology

## 2012-02-12 DIAGNOSIS — I4891 Unspecified atrial fibrillation: Secondary | ICD-10-CM

## 2012-02-12 LAB — POCT INR: INR: 2.3

## 2012-02-18 ENCOUNTER — Other Ambulatory Visit: Payer: Self-pay | Admitting: Cardiology

## 2012-02-19 ENCOUNTER — Ambulatory Visit: Payer: Self-pay | Admitting: Cardiovascular Disease

## 2012-02-19 DIAGNOSIS — I4891 Unspecified atrial fibrillation: Secondary | ICD-10-CM

## 2012-02-19 LAB — POCT INR: INR: 2.2

## 2012-03-04 ENCOUNTER — Ambulatory Visit: Payer: Self-pay | Admitting: Cardiovascular Disease

## 2012-03-04 DIAGNOSIS — I4891 Unspecified atrial fibrillation: Secondary | ICD-10-CM

## 2012-03-05 ENCOUNTER — Other Ambulatory Visit: Payer: Self-pay | Admitting: Cardiology

## 2012-04-01 ENCOUNTER — Ambulatory Visit: Payer: Self-pay | Admitting: Cardiology

## 2012-04-01 DIAGNOSIS — I4891 Unspecified atrial fibrillation: Secondary | ICD-10-CM

## 2012-04-01 LAB — POCT INR: INR: 2.7

## 2012-04-08 ENCOUNTER — Encounter: Payer: Self-pay | Admitting: Nurse Practitioner

## 2012-04-08 ENCOUNTER — Ambulatory Visit (INDEPENDENT_AMBULATORY_CARE_PROVIDER_SITE_OTHER): Payer: Medicare Other | Admitting: Nurse Practitioner

## 2012-04-08 VITALS — BP 120/80 | HR 84 | Ht 68.0 in | Wt 188.0 lb

## 2012-04-08 DIAGNOSIS — R05 Cough: Secondary | ICD-10-CM

## 2012-04-08 DIAGNOSIS — R059 Cough, unspecified: Secondary | ICD-10-CM

## 2012-04-08 DIAGNOSIS — R0600 Dyspnea, unspecified: Secondary | ICD-10-CM

## 2012-04-08 DIAGNOSIS — R0609 Other forms of dyspnea: Secondary | ICD-10-CM

## 2012-04-08 LAB — BASIC METABOLIC PANEL
BUN: 20 mg/dL (ref 6–23)
CO2: 24 mEq/L (ref 19–32)
Calcium: 9.1 mg/dL (ref 8.4–10.5)
Chloride: 105 mEq/L (ref 96–112)
Creatinine, Ser: 1.1 mg/dL (ref 0.4–1.5)
GFR: 69.6 mL/min (ref >60.00)
Glucose, Bld: 107 mg/dL — ABNORMAL HIGH (ref 70–99)
Potassium: 4.7 mEq/L (ref 3.5–5.1)
Sodium: 137 mEq/L (ref 135–145)

## 2012-04-08 LAB — BRAIN NATRIURETIC PEPTIDE: Pro B Natriuretic peptide (BNP): 76 pg/mL (ref 0.0–100.0)

## 2012-04-08 NOTE — Progress Notes (Signed)
Peter Richards Date of Birth: 17-Jul-1928 Medical Record #[id number]  History of Present Illness: Peter Richards is seen today for a work in visit. He is seen for Dr. Patty Sermons. He is an 78 year old male with multiple medical issues. These include CAD with remote CABG in 2002. He had a negative nuclear stress test in 2009. Has had documented mild AS on past echo. His other issues include underlying pacemaker due to tachybrady, PAF, on chronic amiodarone, HLD, and neuropathy.   He comes in today. He is here alone. He was sent at the request of Dr. Pete Glatter. He has not been feeling well for the past 3 to 4 weeks. He has had increasing DOE with just very little activity. He has noted some wheezing at times. Has a cough that is dry. No energy. Appetite is off. Weight has been fairly steady. No swelling. Feels like he is getting worse. He has had labs and a CXR that was reported to be ok.   Current Outpatient Prescriptions on File Prior to Visit  Medication Sig Dispense Refill  . amiodarone (PACERONE) 200 MG tablet Take 100 mg by mouth daily.       [name] escitalopram (LEXAPRO) 20 MG tablet Take 20 mg by mouth daily.       [name] gabapentin (NEURONTIN) 300 MG capsule Take 100 mg by mouth daily.       [name] HYDROcodone-acetaminophen (NORCO) 5-325 MG per tablet Take 1 tablet by mouth every 6 (six) hours as needed. For pain      . metoprolol succinate (TOPROL-XL) 25 MG 24 hr tablet Take 25 mg by mouth daily.      . metoprolol succinate (TOPROL-XL) 25 MG 24 hr tablet TAKE 1 TABLET DAILY  90 tablet  3  . nitroGLYCERIN (NITROSTAT) 0.4 MG SL tablet Place 1 tablet (0.4 mg total) under the tongue every 5 (five) minutes as needed for chest pain.  25 tablet  prn  . polyethylene glycol (MIRALAX / GLYCOLAX) packet Take 17 g by mouth daily as needed. For constipation      . simvastatin (ZOCOR) 10 MG tablet Take 10 mg by mouth at bedtime.      [name] warfarin (COUMADIN) 5 MG tablet Take 1 tablet (5 mg total) by mouth as directed.  105  tablet  1  . warfarin (COUMADIN) 5 MG tablet TAKE AS DIRECTED BY        COUMADIN CLINIC  100 tablet  1  . zolpidem (AMBIEN) 10 MG tablet Take 5 mg by mouth at bedtime as needed and may repeat dose one time if needed. Taking 1/2 tablet        Allergies  Allergen Reactions  . Crestor (Rosuvastatin Calcium)     Leg cramps  . Codeine Nausea And Vomiting    Past Medical History  Diagnosis Date  . Hyperlipidemia   . History of abdominal aortic aneurysm   . History of atrial fibrillation     on amiodarone and has PTVP in place  . Orthostatic hypotension   . Presence of permanent cardiac pacemaker   . Bradycardia   . Syncope     s/p PTVP  . Myocardial infarction     1981  . CAD (coronary artery disease)     prior CABG in 1991 negative Myoview in 2009  . Hypertension   . Stroke     TIA  2007    . Shortness of breath     SINCE FALL 3 WEEKS AGOCHEST AND RIBS SORE  .  Cancer   . GERD (gastroesophageal reflux disease)   . Headache   . Arthritis     OSTEOPOROSIS  . Aortic stenosis, mild     by 01/08/08 echo  . Chronic anticoagulation   . Depression     Past Surgical History  Procedure Date  . Pacemaker insertion   . Insert / replace / remove pacemaker     11/2009  DR BRODIE    . Coronary artery bypass graft     1999  . No past surgeries   . Prostatectomy     BLADDER ALSO REMOVED (OSTOMY BAG)  2006  FOR CANCER     . Abdominal aortic aneurysm repair     1999 STENT PLACED    . Cholecystectomy     4 YRS AGO  . Cataract extraction w/ intraocular lens  implant, bilateral   . Appendectomy     1959  . Revision urostomy cutaneous   . Revision urostomy cutaneous 2006    Urostomy bag placed on right lower abdomen  . Vertebroplasty 08/22/2011    Procedure: VERTEBROPLASTY;  Surgeon: Carmela Hurt, MD;  Location: [facility];  Service: Neurosurgery;  Laterality: N/A;  Lumbar Two vertebroplasty    History  Smoking status  . Never Smoker   Smokeless tobacco  . Never Used     History  Alcohol Use No    History reviewed. No pertinent family history.  Review of Systems: The review of systems is per the HPI.  All other systems were reviewed and are negative.  Physical Exam: BP 120/80  Pulse 84  Ht 5\' 8"  (1.727 m)  Wt 188 lb (85.276 kg)  BMI 28.59 kg/m2 Oxygen sats at rest are 96%. He was walked here in the office and they dropped to a low of 89%. He was short of breath with ambulation.  Patient is very pleasant. He looks like he is tired and not feeling well today. Skin is warm and dry. Color is normal.  HEENT is unremarkable. Normocephalic/atraumatic. PERRL. Sclera are nonicteric. Neck is supple. No masses. No JVD. Lungs are coarse with some ronchi. Cardiac exam shows a regular rate and rhythm. Abdomen is soft. Extremities are without edema. Gait and ROM are intact. No gross neurologic deficits noted.  LABORATORY DATA: EKG shows atrial pacing.   Echo Study Conclusions from 2011  Left ventricle: The cavity size was normal. Wall thickness was increased in a pattern of mild LVH. The estimated ejection fraction was 60%. Wall motion was normal; there were no regional wall motion abnormalities. Doppler parameters are consistent with abnormal left ventricular relaxation (grade 1 diastolic dysfunction).  Labs are reviewed from Dr. Pete Glatter. He has normal renal function. Normal CBC and TSH  BNP is pending today  Assessment / Plan: 1. DOE - with associated cough. He has over a 50 pack year smoking history. He is also on amiodarone and could have toxicity. His CXR was noted to be ok. I have talked with Dr. Patty Sermons. We are going to arrange for CT chest with contrast, PFT's with diffusion, and update his echo. We will also check a BNP today. Further disposition to follow. He may need pulmonary referral.  2. CAD - no angina reported.   For now, no change in his medicines. He does not appear to be volume overloaded. He is borderline in regards to O2 sats for  oxygen. Will proceed with the above tests with further disposition to follow.   Patient is agreeable to this plan and  will call if any problems develop in the interim.

## 2012-04-08 NOTE — Patient Instructions (Signed)
We are going to arrange for an ultrasound of your heart, a scan on your chest (CT with contrast), a breathing test (PFTs with diffusion) and labs today (BNP)  For now, stay on your current medicines.

## 2012-04-09 ENCOUNTER — Encounter: Payer: Self-pay | Admitting: Vascular Surgery

## 2012-04-09 ENCOUNTER — Ambulatory Visit (INDEPENDENT_AMBULATORY_CARE_PROVIDER_SITE_OTHER): Payer: Medicare Other | Admitting: Internal Medicine

## 2012-04-09 DIAGNOSIS — R0989 Other specified symptoms and signs involving the circulatory and respiratory systems: Secondary | ICD-10-CM

## 2012-04-09 DIAGNOSIS — R0600 Dyspnea, unspecified: Secondary | ICD-10-CM

## 2012-04-09 DIAGNOSIS — R05 Cough: Secondary | ICD-10-CM

## 2012-04-09 DIAGNOSIS — R059 Cough, unspecified: Secondary | ICD-10-CM

## 2012-04-09 LAB — PULMONARY FUNCTION TEST

## 2012-04-09 NOTE — Progress Notes (Signed)
PFT done today. 

## 2012-04-10 ENCOUNTER — Encounter: Payer: Self-pay | Admitting: Cardiology

## 2012-04-11 ENCOUNTER — Ambulatory Visit (INDEPENDENT_AMBULATORY_CARE_PROVIDER_SITE_OTHER)
Admission: RE | Admit: 2012-04-11 | Discharge: 2012-04-11 | Disposition: A | Discharge status: Home / Self Care | Payer: Medicare Other | Source: Ambulatory Visit | Attending: Nurse Practitioner | Admitting: Nurse Practitioner

## 2012-04-11 ENCOUNTER — Encounter: Payer: Self-pay | Admitting: Cardiology

## 2012-04-11 ENCOUNTER — Other Ambulatory Visit: Payer: Self-pay | Admitting: *Deleted

## 2012-04-11 ENCOUNTER — Ambulatory Visit (HOSPITAL_COMMUNITY): Payer: Medicare Other | Attending: Cardiology

## 2012-04-11 DIAGNOSIS — R0600 Dyspnea, unspecified: Secondary | ICD-10-CM

## 2012-04-11 DIAGNOSIS — R0989 Other specified symptoms and signs involving the circulatory and respiratory systems: Secondary | ICD-10-CM | POA: Insufficient documentation

## 2012-04-11 DIAGNOSIS — F17200 Nicotine dependence, unspecified, uncomplicated: Secondary | ICD-10-CM | POA: Insufficient documentation

## 2012-04-11 DIAGNOSIS — R0602 Shortness of breath: Secondary | ICD-10-CM

## 2012-04-11 DIAGNOSIS — I2510 Atherosclerotic heart disease of native coronary artery without angina pectoris: Secondary | ICD-10-CM | POA: Insufficient documentation

## 2012-04-11 DIAGNOSIS — R0609 Other forms of dyspnea: Secondary | ICD-10-CM | POA: Insufficient documentation

## 2012-04-11 DIAGNOSIS — I4891 Unspecified atrial fibrillation: Secondary | ICD-10-CM | POA: Insufficient documentation

## 2012-04-11 DIAGNOSIS — I369 Nonrheumatic tricuspid valve disorder, unspecified: Secondary | ICD-10-CM | POA: Insufficient documentation

## 2012-04-11 DIAGNOSIS — R05 Cough: Secondary | ICD-10-CM

## 2012-04-11 DIAGNOSIS — R9389 Abnormal findings on diagnostic imaging of other specified body structures: Secondary | ICD-10-CM

## 2012-04-11 DIAGNOSIS — R059 Cough, unspecified: Secondary | ICD-10-CM

## 2012-04-11 DIAGNOSIS — I080 Rheumatic disorders of both mitral and aortic valves: Secondary | ICD-10-CM | POA: Insufficient documentation

## 2012-04-11 DIAGNOSIS — I379 Nonrheumatic pulmonary valve disorder, unspecified: Secondary | ICD-10-CM | POA: Insufficient documentation

## 2012-04-11 MED ORDER — IOHEXOL 300 MG/ML  SOLN
80.0000 mL | Freq: Once | INTRAMUSCULAR | Status: AC | PRN
  Administered 2012-04-11: 80 mL via INTRAVENOUS

## 2012-04-11 NOTE — Progress Notes (Signed)
Echocardiogram performed.  

## 2012-04-14 ENCOUNTER — Ambulatory Visit (INDEPENDENT_AMBULATORY_CARE_PROVIDER_SITE_OTHER): Payer: Medicare Other | Admitting: *Deleted

## 2012-04-14 DIAGNOSIS — I4891 Unspecified atrial fibrillation: Secondary | ICD-10-CM

## 2012-04-14 DIAGNOSIS — Z950 Presence of cardiac pacemaker: Secondary | ICD-10-CM

## 2012-04-15 ENCOUNTER — Telehealth: Payer: Self-pay | Admitting: *Deleted

## 2012-04-15 NOTE — Telephone Encounter (Signed)
Pulmonary appt. With Dr Delton Coombes

## 2012-04-15 NOTE — Telephone Encounter (Signed)
Advised patient needs to see pulmonary secondary to dyspnea and PFT results.  Scheduled appointment for 04/25/12 at 4:15, arrive at 4:00.  Advised patient of date and time

## 2012-04-16 ENCOUNTER — Encounter: Payer: Self-pay | Admitting: Cardiology

## 2012-04-18 ENCOUNTER — Telehealth: Payer: Self-pay | Admitting: Cardiology

## 2012-04-18 LAB — REMOTE PACEMAKER DEVICE
AL IMPEDENCE PM: 498 Ohm
AL THRESHOLD: 0.625 V
ATRIAL PACING PM: 98
BATTERY VOLTAGE: 2.79 V

## 2012-04-18 NOTE — Telephone Encounter (Signed)
Discussed with  Dr. Patty Sermons and ok to wait. Will have patient stop Amiodarone for now, advised patient.

## 2012-04-18 NOTE — Telephone Encounter (Signed)
Pt calling re appt that pt had with dr bynum that he cxl, rs to 12-3, is there anything critical that needs att before then?

## 2012-04-22 ENCOUNTER — Telehealth: Payer: Self-pay | Admitting: Cardiology

## 2012-04-22 ENCOUNTER — Encounter: Payer: Self-pay | Admitting: *Deleted

## 2012-04-22 NOTE — Telephone Encounter (Signed)
New problem:   Need a note stating how long patient can come off couamdin and days before procedure. Due to epidural injection .

## 2012-04-22 NOTE — Telephone Encounter (Signed)
plz return call to pt [phone] to answer question about tense muscle stimulator use and pacer implant.

## 2012-04-22 NOTE — Telephone Encounter (Signed)
Discussed tens unit with Belenda Cruise (she spoke with Dr [name]) and ok to use tens unit if on lower back.  Tried [facility] and they are closed for the day.  Will call back on Thursday when back in office.   Spoke with patient and advised would call him Thursday after discussing holding coumadin with  Dr. Patty Sermons

## 2012-04-24 NOTE — Telephone Encounter (Signed)
Discussed with  Dr. Patty Sermons and ok to hold Coumadin. Left message to call back

## 2012-04-24 NOTE — Telephone Encounter (Signed)
Left message for Toni Amend ok to use if lower back.

## 2012-04-24 NOTE — Telephone Encounter (Signed)
Follow-up:    Called in with another question about the patient having a steroid injection and about hold that patients coumadin.  Please call back.

## 2012-04-25 ENCOUNTER — Institutional Professional Consult (permissible substitution): Payer: Medicare Other | Admitting: Emergency Medicine

## 2012-04-25 NOTE — Telephone Encounter (Signed)
F/u   Pt calling nurse Juliette Alcide, he can be reached at [phone]

## 2012-04-25 NOTE — Telephone Encounter (Signed)
Spoke with patient and advised ok to hold coumadin prior to procedure per  Dr. Patty Sermons

## 2012-04-25 NOTE — Telephone Encounter (Signed)
F/u  Returning call back to nurse.  

## 2012-04-29 ENCOUNTER — Ambulatory Visit: Payer: Self-pay | Admitting: Cardiology

## 2012-04-29 ENCOUNTER — Telehealth: Payer: Self-pay | Admitting: Cardiology

## 2012-04-29 DIAGNOSIS — I4891 Unspecified atrial fibrillation: Secondary | ICD-10-CM

## 2012-04-29 LAB — POCT INR: INR: 1.8

## 2012-04-29 NOTE — Telephone Encounter (Signed)
Left message for Peter Richards at pain management to call back.   Spoke with Peter Richards and they would like patient off Coumadin 7 days prior and 7 days after with INR day before procedure.  Patient will be scheduled for 05/14/12. Will fax over ok signed ok [phone]  Advised patient

## 2012-04-29 NOTE — Telephone Encounter (Signed)
Pt needs to schedule a time to hold his coumadin for his procedure but we are not responding and he need to know what decision Dr. Patty Sermons made regarding this

## 2012-04-29 NOTE — Telephone Encounter (Signed)
Okay to leave off Coumadin 7 days before and 7 days after surgery.  He remains in sinus rhythm.

## 2012-05-05 ENCOUNTER — Encounter: Payer: Self-pay | Admitting: Internal Medicine

## 2012-05-05 ENCOUNTER — Ambulatory Visit: Payer: Self-pay | Admitting: *Deleted

## 2012-05-05 ENCOUNTER — Ambulatory Visit: Payer: Medicare Other | Admitting: Cardiology

## 2012-05-05 ENCOUNTER — Other Ambulatory Visit: Payer: Medicare Other

## 2012-05-06 ENCOUNTER — Encounter: Payer: Self-pay | Admitting: Cardiology

## 2012-05-06 ENCOUNTER — Other Ambulatory Visit (INDEPENDENT_AMBULATORY_CARE_PROVIDER_SITE_OTHER): Payer: Medicare Other

## 2012-05-06 ENCOUNTER — Ambulatory Visit (INDEPENDENT_AMBULATORY_CARE_PROVIDER_SITE_OTHER): Payer: Medicare Other | Admitting: Cardiology

## 2012-05-06 ENCOUNTER — Ambulatory Visit (INDEPENDENT_AMBULATORY_CARE_PROVIDER_SITE_OTHER): Payer: Medicare Other | Admitting: Emergency Medicine

## 2012-05-06 ENCOUNTER — Encounter: Payer: Self-pay | Admitting: Emergency Medicine

## 2012-05-06 VITALS — BP 143/89 | HR 83 | Ht 69.0 in | Wt 186.4 lb

## 2012-05-06 VITALS — BP 92/60 | HR 64 | Temp 97.3°F | Ht 68.0 in | Wt 188.8 lb

## 2012-05-06 DIAGNOSIS — E7800 Pure hypercholesterolemia, unspecified: Secondary | ICD-10-CM

## 2012-05-06 DIAGNOSIS — I4891 Unspecified atrial fibrillation: Secondary | ICD-10-CM

## 2012-05-06 DIAGNOSIS — J8410 Pulmonary fibrosis, unspecified: Secondary | ICD-10-CM

## 2012-05-06 DIAGNOSIS — R0989 Other specified symptoms and signs involving the circulatory and respiratory systems: Secondary | ICD-10-CM

## 2012-05-06 DIAGNOSIS — J849 Interstitial pulmonary disease, unspecified: Secondary | ICD-10-CM

## 2012-05-06 DIAGNOSIS — Z8679 Personal history of other diseases of the circulatory system: Secondary | ICD-10-CM

## 2012-05-06 DIAGNOSIS — R0609 Other forms of dyspnea: Secondary | ICD-10-CM

## 2012-05-06 DIAGNOSIS — I480 Paroxysmal atrial fibrillation: Secondary | ICD-10-CM

## 2012-05-06 DIAGNOSIS — I2510 Atherosclerotic heart disease of native coronary artery without angina pectoris: Secondary | ICD-10-CM

## 2012-05-06 DIAGNOSIS — E785 Hyperlipidemia, unspecified: Secondary | ICD-10-CM

## 2012-05-06 LAB — LIPID PANEL
HDL: 37.3 mg/dL — ABNORMAL LOW (ref >39.00)
Triglycerides: 210 mg/dL — ABNORMAL HIGH (ref 0.0–149.0)

## 2012-05-06 LAB — HEPATIC FUNCTION PANEL
Albumin: 4.1 g/dL (ref 3.5–5.2)
Bilirubin, Direct: 0.1 mg/dL (ref 0.0–0.3)
Total Protein: 7.5 g/dL (ref 6.0–8.3)

## 2012-05-06 LAB — BASIC METABOLIC PANEL
CO2: 27 mEq/L (ref 19–32)
Calcium: 9 mg/dL (ref 8.4–10.5)
Creatinine, Ser: 1.1 mg/dL (ref 0.4–1.5)
Glucose, Bld: 96 mg/dL (ref 70–99)

## 2012-05-06 MED ORDER — NITROGLYCERIN 0.4 MG SL SUBL: 0.4000 mg | SUBLINGUAL_TABLET | SUBLINGUAL | Status: DC | PRN

## 2012-05-06 NOTE — Progress Notes (Signed)
Quick Note:  Please report to patient. The recent labs are stable. Continue same medication and careful diet. ______ 

## 2012-05-06 NOTE — Patient Instructions (Addendum)
Walking oximetry today  I agree with stopping amiodarone. You probably should not restart this medication.  We will start Spiriva 1 inhalation daily to see if this helps your breathing.  Follow with Dr Delton Coombes in 5-6 weeks to reassess your symptoms.

## 2012-05-06 NOTE — Patient Instructions (Signed)

## 2012-05-06 NOTE — Assessment & Plan Note (Signed)
The patient complains of increasing exertional dyspnea.  Because of these complaints we stopped his amiodarone several weeks ago.  He has had recent pulmonary function studies and has an appointment to see Dr. Solon Augusta soon to discuss results.

## 2012-05-06 NOTE — Assessment & Plan Note (Signed)
Patient has a history of hypercholesterolemia and is on simvastatin 10 mg daily.  Rechecking fasting lab work today

## 2012-05-06 NOTE — Assessment & Plan Note (Signed)
The patient has a past history of paroxysmal atrial fibrillation.  He was remaining in normal sinus rhythm.  EKG today shows AV paced rhythm

## 2012-05-06 NOTE — Progress Notes (Signed)
Subjective:    Patient ID: Peter Richards, male    DOB: 06/24/1928, 78 y.o.   MRN: [id number]  HPI 78 yo man, former smoker (150 pk-yrs), hx CAD/CABG ('02), A Fib on amiodarone for the last 4 years, was stopped 1 month ago, followed by Peter Richards. He has been experiencing progressive exertional SOB for over 3 months such that he had to stop to rest with walking about 75 feet. He has also had some exertional CP that has responded to NTG, but the episodes of dyspnea haven't always occurred at same time as the CP. He has a cough, bothering him for about a year. He hears wheezing at times, seems to come at random, not necessarily with exertion.  Hurts sometimes to take a deep breath. Denies any sx or RA, SLE, etc.   Data:  PFT were performed  04/09/12>> show mixed disease, no BD response, restricted volumes, decreased DLCO that corrects for Va  TTE 04/11/12:  Study Conclusions - Left ventricle: The cavity size was normal. Wall thickness was increased in a pattern of moderate LVH. Systolic function was normal. The estimated ejection fraction was in the range of 55% to 65%. Wall motion was normal; there were no regional wall motion abnormalities. Doppler parameters are consistent with abnormal left ventricular relaxation (grade 1 diastolic dysfunction). - Aortic valve: Trivial regurgitation. - Mitral valve: Calcified annulus. - Left atrium: The atrium was mildly dilated. - Atrial septum: No defect or patent foramen ovale was identified.  CT scan chest 04/11/12 Comparison: Most recent prior CT scan of the chest 12/16/2009,  prior chest x-ray 08/20/2011  Findings:  Mediastinum: Unremarkable thyroid gland and thoracic inlet. Very  similar appearance of borderline enlarged mediastinal and hilar  lymph nodes compared to the prior CT scan. An index  aorticopulmonary window node again measures 10 mm in short axis.  Several low right paratracheal nodes demonstrate normal fatty hila.  The  thoracic esophagus is unremarkable.  Heart/Vascular: Left subclavian approach dual lead cardiac rhythm  maintenance device. Leads project over the right atrium and right  ventricular apex. The heart is within normal limits for size. No  pericardial effusion. Conventional three-vessel arch anatomy.  Scattered atherosclerotic vascular calcification without  significant stenosis or aneurysmal dilatation. The patient is  status post median sternotomy with evidence of LIMA to LAD bypass  and descending aorta to right coronary artery bypass. Both bypass  vessels opacify with contrast material consistent with patency.  There is atherosclerotic calcification of the coronary arteries.  Lungs/Pleura: Overall, similar pattern of diffuse peripheral  subpleural reticulation with mild honeycombing in the bases and  areas of mild traction bronchiectasis. The findings appear stable  to incrementally progressed compared to 12/16/2009. Mild  superimposed paraseptal emphysema noted in the apices. Stable 2 mm  nodular opacity in the right middle lobe is likely benign. No new  suspicious pulmonary nodule identified.  Upper Abdomen: Surgical changes of prior cholecystectomy.  Otherwise, the visualized upper abdomen is unremarkable.  Bones: No acute fracture or aggressive appearing lytic or blastic  osseous lesion.  IMPRESSION:  1. No acute cardiopulmonary or thoracic process.  2. Stable to incremental progression of the findings of mild -  moderate interstitial pulmonary fibrosis (pattern most consistent  with UIP) compared to 12/16/2009.    Review of Systems  Constitutional: Negative for fever and unexpected weight change.  HENT: Positive for trouble swallowing. Negative for ear pain, nosebleeds, congestion, sore throat, rhinorrhea, sneezing, dental problem, postnasal drip and sinus pressure.  Eyes: Negative for redness and itching.  Respiratory: Positive for cough, shortness of breath and wheezing.  Negative for chest tightness.   Cardiovascular: Positive for chest pain. Negative for palpitations and leg swelling.  Gastrointestinal: Negative for nausea and vomiting.  Genitourinary: Negative for dysuria.  Musculoskeletal: Negative for joint swelling.  Skin: Negative for rash.  Neurological: Negative for headaches.  Hematological: Does not bruise/bleed easily.  Psychiatric/Behavioral: Negative for dysphoric mood. The patient is not nervous/anxious.     Past Medical History  Diagnosis Date  . Hyperlipidemia   . History of abdominal aortic aneurysm   . History of atrial fibrillation     on amiodarone and has PTVP in place  . Orthostatic hypotension   . Presence of permanent cardiac pacemaker   . Bradycardia   . Syncope     s/p PTVP  . Myocardial infarction     1981  . CAD (coronary artery disease)     prior CABG in 2002 negative Myoview in 2009  . Hypertension   . Stroke     TIA  2007    . Shortness of breath     SINCE FALL 3 WEEKS AGOCHEST AND RIBS SORE  . Cancer     bladder/prostate  . GERD (gastroesophageal reflux disease)   . Headache   . Arthritis     OSTEOPOROSIS  . Aortic stenosis, mild     by 01/08/08 echo  . Chronic anticoagulation   . Depression   . AAA (abdominal aortic aneurysm)   . Heart attack      Family History  Problem Relation Age of Onset  . Cancer Father     stomach  . Cancer Sister     lung     History   Social History  . Marital Status: Married    Spouse Name: N/A    Number of Children: 3  . Years of Education: N/A   Occupational History  . Retired    Social History Main Topics  . Smoking status: Former Smoker -- 3.0 packs/day for 50 years    Types: Cigarettes    Quit date: 06/04/1978  . Smokeless tobacco: Never Used  . Alcohol Use: No     Comment: rare "once a month"  . Drug Use: No  . Sexually Active: No   Other Topics Concern  . Not on file   Social History Narrative  . 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 Also with some asbestos exposure.  Was in the [employer]    Allergies  Allergen Reactions  . Crestor (Rosuvastatin Calcium)     Leg cramps  . Codeine Nausea And Vomiting     Outpatient Prescriptions Prior to Visit  Medication Sig Dispense Refill  . escitalopram (LEXAPRO) 20 MG tablet Take 20 mg by mouth daily.       [name] gabapentin (NEURONTIN) 300 MG capsule Take 100 mg by mouth daily.       [name] HYDROcodone-acetaminophen (NORCO) 5-325 MG per tablet Take 1 tablet by mouth every 6 (six) hours as needed. For pain      . metoprolol succinate (TOPROL-XL) 25 MG 24 hr tablet Take 25 mg by mouth daily.      . Multiple Vitamin (MULTIVITAMIN) tablet Take 1 tablet by mouth daily.      . polyethylene glycol (MIRALAX / GLYCOLAX) packet Take 17 g by mouth daily as needed. For constipation      . simvastatin (ZOCOR) 10  MG tablet Take 10 mg by mouth at bedtime.      [name] warfarin (COUMADIN) 5 MG tablet Take 1 tablet (5 mg total) by mouth as directed.  105 tablet  1  . warfarin (COUMADIN) 5 MG tablet TAKE AS DIRECTED BY        COUMADIN CLINIC  100 tablet  1  . zolpidem (AMBIEN) 10 MG tablet Take 5 mg by mouth at bedtime as needed and may repeat dose one time if needed. Taking 1/2 tablet      . [DISCONTINUED] metoprolol succinate (TOPROL-XL) 25 MG 24 hr tablet TAKE 1 TABLET DAILY  90 tablet  3   Last reviewed on 05/06/2012  4:06 PM by Lazarus Salines, RN       Objective:   Physical Exam  Filed Vitals:   05/06/12 1607  BP: 92/60  Pulse: 64  Temp: 97.3 F (36.3 C)   Gen: Pleasant, well-nourished, in no distress,  normal affect  ENT: No lesions,  mouth clear,  oropharynx clear, no postnasal drip  Neck: No JVD, no TMG, no carotid bruits  Lungs: No use of accessory muscles, distant, B insp crackles  Cardiovascular: RRR, heart sounds normal, no murmur or gallops, no peripheral edema  Musculoskeletal: No deformities, no cyanosis or  clubbing  Neuro: alert, non focal  Skin: Warm, no lesions or rashes      Assessment & Plan:  Interstitial lung disease ? Occupational exposure, ? Possible aspiration or autoimmune disease. Given the progression I agree with d/c amiodarone. Will consider further w/u of the ILD, but not sure VATS bx will be helpful. Consider swallowing eval, or possibly eben prednisone trial in the future depending on how we impact his dyspnea with BD's  Dyspnea on exertion Multifactorial - probably being impacted by slow progressive ILD. Also evidence to support AFL on his spirometry with large tobacco hx. Finally, some of his sx sound like stable angina. I think the highest-yield interventions would be to try a bronchodilator to see if he benefits. Also will insure he doesn;t desaturate and could benefit from o2. Agree that he should not be on amio anymore. I'll f/u with him after he tries the spiriva to assess his status

## 2012-05-06 NOTE — Assessment & Plan Note (Signed)
The patient has occasional episodes of mild chest discomfort.  He takes occasional sublingual nitroglycerin.  He has not had to take any sublingual nitroglycerin in the past week.

## 2012-05-06 NOTE — Progress Notes (Signed)
Peter Richards Date of Birth:  August 14, 1928 [facility] [address] [place], [place]  [address] [phone]         Fax   [phone]  History of Present Illness: This pleasant 78 year old gentleman is seen for a scheduled followup office visit. He has a history of tachybradycardia syndrome and paroxysmal atrial fibrillation. He has a dual-chamber pacemaker. His pacemaker is followed by Dr. [name]. He is not having any symptoms from his heart recently. He has a history of hypercholesterolemia. The patient is on long-term Coumadin anticoagulation because of his paroxysmal A. fib. He is maintaining normal sinus rhythm.  The patient had been on low dose amiodarone 100 mg daily but this was stopped 3 weeks ago because of symptoms of worsening dyspnea.  The patient had back surgery in March 2013 by Dr. [name]. .  The patient is now attending a pain control center because of his back pain and his leg pain. He has a burning discomfort in both legs which is less when he lies down or rests. He had a recent treatment with what he described as a burning needle treatment and he was off his Coumadin for a week prior to that.  He is scheduled to have another epidural treatment at the pain control Center on [address] and he will be off his Coumadin for 7 days prior and 7 days after the treatment.  The patient had a chest x-ray done in [facility] by [facility] which showed normal heart size and a mild increase in interstitial disease.  The patient had an echocardiogram 04/11/12 showed an ejection fraction of 55-65% with grade 1 diastolic dysfunction.  His last nuclear stress test was an adenosine study on 04/21/08 which showed an ejection fraction of 61% and no ischemia.  Current Outpatient Prescriptions  Medication Sig Dispense Refill  . escitalopram (LEXAPRO) 20 MG tablet Take 20 mg by mouth daily.       [name] gabapentin (NEURONTIN) 300 MG capsule Take 100 mg by mouth daily.       [name]  HYDROcodone-acetaminophen (NORCO) 5-325 MG per tablet Take 1 tablet by mouth every 6 (six) hours as needed. For pain      . metoprolol succinate (TOPROL-XL) 25 MG 24 hr tablet Take 25 mg by mouth daily.      . metoprolol succinate (TOPROL-XL) 25 MG 24 hr tablet TAKE 1 TABLET DAILY  90 tablet  3  . Multiple Vitamin (MULTIVITAMIN) tablet Take 1 tablet by mouth daily.      . nitroGLYCERIN (NITROSTAT) 0.4 MG SL tablet Place 1 tablet (0.4 mg total) under the tongue every 5 (five) minutes as needed for chest pain.  25 tablet  prn  . polyethylene glycol (MIRALAX / GLYCOLAX) packet Take 17 g by mouth daily as needed. For constipation      . simvastatin (ZOCOR) 10 MG tablet Take 10 mg by mouth at bedtime.      [name] warfarin (COUMADIN) 5 MG tablet Take 1 tablet (5 mg total) by mouth as directed.  105 tablet  1  . warfarin (COUMADIN) 5 MG tablet TAKE AS DIRECTED BY        COUMADIN CLINIC  100 tablet  1  . zolpidem (AMBIEN) 10 MG tablet Take 5 mg by mouth at bedtime as needed and may repeat dose one time if needed. Taking 1/2 tablet      . [DISCONTINUED] nitroGLYCERIN (NITROSTAT) 0.4 MG SL tablet Place 1 tablet (0.4 mg total) under  the tongue every 5 (five) minutes as needed for chest pain.  25 tablet  prn    Allergies  Allergen Reactions  . Crestor (Rosuvastatin Calcium)     Leg cramps  . Codeine Nausea And Vomiting    Patient Active Problem List  Diagnosis  . HYPERLIPIDEMIA  . CORONARY ARTERY DISEASE  . BRADYCARDIA  . HYPOTENSION, ORTHOSTATIC  . SYNCOPE  . CHEST PAIN  . ABDOMINAL AORTIC ANEURYSM, HX OF  . ATRIAL FIBRILLATION, HX OF  . PACEMAKER, PERMANENT  . Atrial fibrillation  . A-fib  . Hx of bladder cancer  . Dyslipidemia  . Low back pain  . Lumbar compression fracture  . Interstitial lung disease    History  Smoking status  . Never Smoker   Smokeless tobacco  . Never Used    History  Alcohol Use No    No family history on file.  Review of Systems: Constitutional: no  fever chills diaphoresis or fatigue or change in weight.  Head and neck: no hearing loss, no epistaxis, no photophobia or visual disturbance. Respiratory: No cough, shortness of breath or wheezing. Cardiovascular: No chest pain peripheral edema, palpitations. Gastrointestinal: No abdominal distention, no abdominal pain, no change in bowel habits hematochezia or melena. Genitourinary: No dysuria, no frequency, no urgency, no nocturia. Musculoskeletal:No arthralgias, no back pain, no gait disturbance or myalgias. Neurological: No dizziness, no headaches, no numbness, no seizures, no syncope, no weakness, no tremors. Hematologic: No lymphadenopathy, no easy bruising. Psychiatric: No confusion, no hallucinations, no sleep disturbance.    Physical Exam: Filed Vitals:   05/06/12 1412  BP: 143/89  Pulse: 83   general appearance reveals a well-developed well-nourished gentleman in no distress.The head and neck exam reveals pupils equal and reactive.  Extraocular movements are full.  There is no scleral icterus.  The mouth and pharynx are normal.  The neck is supple.  The carotids reveal no bruits.  The jugular venous pressure is normal.  The  thyroid is not enlarged.  There is no lymphadenopathy.  The chest is clear to percussion and auscultation.  There are no rales or rhonchi.  Expansion of the chest is symmetrical.  The precordium is quiet.  The first heart sound is normal.  The second heart sound is physiologically split.  There is no murmur gallop rub or click.  There is no abnormal lift or heave.  The abdomen is soft and nontender.  The bowel sounds are normal.  The liver and spleen are not enlarged.  There are no abdominal masses.  There are no abdominal bruits.  Extremities reveal good pedal pulses.  There is no phlebitis or edema.  There is no cyanosis or clubbing.  Strength is normal and symmetrical in all extremities.  There is no lateralizing weakness.  There are no sensory deficits.  The skin  is warm and dry.  There is no rash.  EKG today shows AV paced rhythm at 65 per minute and no ischemic changes  Assessment / Plan: Patient is to continue on same medication.  Await completion of pulmonary evaluation.  Continue to stay off of amiodarone.  If his frequency of chest pain increases we will consider followup Myoview stress test that at the present time his symptoms are infrequent and mild, and his exertional dyspnea appears to be his main concern. Recheck in 4 months for office visit lipid panel hepatic function panel and basal metabolic panel.

## 2012-05-07 ENCOUNTER — Institutional Professional Consult (permissible substitution): Payer: Medicare Other | Admitting: Emergency Medicine

## 2012-05-07 ENCOUNTER — Telehealth: Payer: Self-pay | Admitting: *Deleted

## 2012-05-07 NOTE — Telephone Encounter (Signed)
Advised patient of lab results   Patient stated at his pulmonary appointment yesterday his blood pressure was low 92/60 but stated he had no dizziness and felt fine.  Patient will start checking blood pressure and call back if systolic remains below 100

## 2012-05-07 NOTE — Telephone Encounter (Signed)
Agree with plan 

## 2012-05-07 NOTE — Telephone Encounter (Signed)
Message copied by Burnell Blanks on Wed May 07, 2012 10:41 AM ------      Message from: Cassell Clement      Created: Tue May 06, 2012  5:16 PM       Please report to patient.  The recent labs are stable. Continue same medication and careful diet.

## 2012-05-13 ENCOUNTER — Ambulatory Visit: Payer: Self-pay | Admitting: Cardiovascular Disease

## 2012-05-13 DIAGNOSIS — I4891 Unspecified atrial fibrillation: Secondary | ICD-10-CM

## 2012-05-27 ENCOUNTER — Ambulatory Visit: Payer: Self-pay | Admitting: Cardiology

## 2012-05-27 DIAGNOSIS — I4891 Unspecified atrial fibrillation: Secondary | ICD-10-CM

## 2012-06-05 DIAGNOSIS — J449 Chronic obstructive pulmonary disease, unspecified: Secondary | ICD-10-CM | POA: Insufficient documentation

## 2012-06-05 NOTE — Assessment & Plan Note (Signed)
?   Occupational exposure, ? Possible aspiration or autoimmune disease. Given the progression I agree with d/c amiodarone. Will consider further w/u of the ILD, but not sure VATS bx will be helpful. Consider swallowing eval, or possibly eben prednisone trial in the future depending on how we impact his dyspnea with BD's

## 2012-06-05 NOTE — Assessment & Plan Note (Addendum)
Multifactorial - probably being impacted by slow progressive ILD. Also evidence to support AFL on his spirometry with large tobacco hx. Finally, some of his sx sound like stable angina. I think the highest-yield interventions would be to try a bronchodilator to see if he benefits. Also will insure he doesn;t desaturate and could benefit from o2. Agree that he should not be on amio anymore. I'll f/u with him after he tries the spiriva to assess his status

## 2012-06-10 ENCOUNTER — Ambulatory Visit (INDEPENDENT_AMBULATORY_CARE_PROVIDER_SITE_OTHER): Payer: Medicare Other | Admitting: Emergency Medicine

## 2012-06-10 ENCOUNTER — Encounter: Payer: Self-pay | Admitting: Emergency Medicine

## 2012-06-10 VITALS — BP 130/78 | HR 57 | Temp 97.4°F | Ht 68.0 in | Wt 188.4 lb

## 2012-06-10 DIAGNOSIS — J8410 Pulmonary fibrosis, unspecified: Secondary | ICD-10-CM

## 2012-06-10 DIAGNOSIS — J449 Chronic obstructive pulmonary disease, unspecified: Secondary | ICD-10-CM

## 2012-06-10 DIAGNOSIS — J849 Interstitial pulmonary disease, unspecified: Secondary | ICD-10-CM

## 2012-06-10 NOTE — Patient Instructions (Addendum)
Please continue Spiriva daily We will teach you how to use an albuterol inhaler. Use 2 puffs if needed for shortness of breath.  We will repeat your walking oximetry today After your swallowing test in 2012, the following recommendations were made: perform a hard cough after swallowing, sit upright 30-60 min after meal, eat seated upright at 90 degrees.  Follow with Dr Delton Coombes in 6 months or sooner if you have any problems

## 2012-06-10 NOTE — Assessment & Plan Note (Signed)
Continue Spiriva Teach albuterol prn Repeat walking oximetry today, reconsider O2

## 2012-06-10 NOTE — Progress Notes (Signed)
Subjective:    Patient ID: Peter Richards, male    DOB: 1929-01-09, 79 y.o.   MRN: [id number]  HPI 79 yo man, former smoker (150 pk-yrs), hx CAD/CABG ('02), A Fib on amiodarone for the last 4 years, was stopped 1 month ago, followed by [name]. He has been experiencing progressive exertional SOB for over 3 months such that he had to stop to rest with walking about 75 feet. He has also had some exertional CP that has responded to NTG, but the episodes of dyspnea haven't always occurred at same time as the CP. He has a cough, bothering him for about a year. He hears wheezing at times, seems to come at random, not necessarily with exertion.  Hurts sometimes to take a deep breath. Denies any sx or RA, SLE, etc.   ROV 06/10/12 -- follows up for dyspnea, presumed to be multifactorial. He has ILD on CT scan chest as well as obstruction on PFT. We started Spiriva last time to see if he would benefit >> he believes that he is less SOB. He hasn't exerted significantly, but his usual daily activities are easier. His wheeze is better. Last time he desaturated with ambulation, but wanted to defer. He has intermittent aspiration sx >> swallow precautions recommended 04/2011: 1. Consider esophageal assessment 2. Regular textures & Thin liquids 3. Compensations: Hard cough after swallow 4. Upright 30-60 min after meal;Seated upright 90 degrees    Data:  PFT were performed  04/09/12>> show mixed disease, no BD response, restricted volumes, decreased DLCO that corrects for Va  TTE 04/11/12:  Study Conclusions - Left ventricle: The cavity size was normal. Wall thickness was increased in a pattern of moderate LVH. Systolic function was normal. The estimated ejection fraction was in the range of 55% to 65%. Wall motion was normal; there were no regional wall motion abnormalities. Doppler parameters are consistent with abnormal left ventricular relaxation (grade 1 diastolic dysfunction). - Aortic valve:  Trivial regurgitation. - Mitral valve: Calcified annulus. - Left atrium: The atrium was mildly dilated. - Atrial septum: No defect or patent foramen ovale was identified.  CT scan chest 04/11/12 Comparison: Most recent prior CT scan of the chest 12/16/2009,  prior chest x-ray 08/20/2011  Findings:  Mediastinum: Unremarkable thyroid gland and thoracic inlet. Very  similar appearance of borderline enlarged mediastinal and hilar  lymph nodes compared to the prior CT scan. An index  aorticopulmonary window node again measures 10 mm in short axis.  Several low right paratracheal nodes demonstrate normal fatty hila.  The thoracic esophagus is unremarkable.  Heart/Vascular: Left subclavian approach dual lead cardiac rhythm  maintenance device. Leads project over the right atrium and right  ventricular apex. The heart is within normal limits for size. No  pericardial effusion. Conventional three-vessel arch anatomy.  Scattered atherosclerotic vascular calcification without  significant stenosis or aneurysmal dilatation. The patient is  status post median sternotomy with evidence of LIMA to LAD bypass  and descending aorta to right coronary artery bypass. Both bypass  vessels opacify with contrast material consistent with patency.  There is atherosclerotic calcification of the coronary arteries.  Lungs/Pleura: Overall, similar pattern of diffuse peripheral  subpleural reticulation with mild honeycombing in the bases and  areas of mild traction bronchiectasis. The findings appear stable  to incrementally progressed compared to 12/16/2009. Mild  superimposed paraseptal emphysema noted in the apices. Stable 2 mm  nodular opacity in the right middle lobe is likely benign. No new  suspicious pulmonary nodule  identified.  Upper Abdomen: Surgical changes of prior cholecystectomy.  Otherwise, the visualized upper abdomen is unremarkable.  Bones: No acute fracture or aggressive appearing lytic or  blastic  osseous lesion.  IMPRESSION:  1. No acute cardiopulmonary or thoracic process.  2. Stable to incremental progression of the findings of mild -  moderate interstitial pulmonary fibrosis (pattern most consistent  with UIP) compared to 12/16/2009.        Objective:   Physical Exam  Filed Vitals:   06/10/12 1424  BP: 130/78  Pulse: 57  Temp: 97.4 F (36.3 C)   Gen: Pleasant, well-nourished, in no distress,  normal affect  ENT: No lesions,  mouth clear,  oropharynx clear, no postnasal drip  Neck: No JVD, no TMG, no carotid bruits  Lungs: No use of accessory muscles, distant, B insp crackles  Cardiovascular: RRR, heart sounds normal, no murmur or gallops, no peripheral edema  Musculoskeletal: No deformities, no cyanosis or clubbing  Neuro: alert, non focal  Skin: Warm, no lesions or rashes      Assessment & Plan:  Interstitial lung disease Etiology ? Suspect at least some component of aspiration, also a hx rock dust exposure.  - swallow precautions based on swallow eval from 2012 - will decide based on clinical course if/when to repeat CT scan of the chest  - agree with no amiodarone  COPD (chronic obstructive pulmonary disease) Continue Spiriva Teach albuterol prn Repeat walking oximetry today, reconsider O2

## 2012-06-10 NOTE — Assessment & Plan Note (Signed)
Etiology ? Suspect at least some component of aspiration, also a hx rock dust exposure.  - swallow precautions based on swallow eval from 2012 - will decide based on clinical course if/when to repeat CT scan of the chest  - agree with no amiodarone

## 2012-06-11 ENCOUNTER — Ambulatory Visit: Payer: Self-pay | Admitting: Cardiovascular Disease

## 2012-06-11 DIAGNOSIS — I4891 Unspecified atrial fibrillation: Secondary | ICD-10-CM

## 2012-06-11 LAB — POCT INR: INR: 3.4

## 2012-06-13 ENCOUNTER — Telehealth: Payer: Self-pay | Admitting: Emergency Medicine

## 2012-06-13 MED ORDER — TIOTROPIUM BROMIDE MONOHYDRATE 18 MCG IN CAPS: 18.0000 ug | ORAL_CAPSULE | Freq: Every day | RESPIRATORY_TRACT | Status: DC

## 2012-06-13 MED ORDER — ALBUTEROL SULFATE HFA 108 (90 BASE) MCG/ACT IN AERS: 2.0000 | INHALATION_SPRAY | Freq: Four times a day (QID) | RESPIRATORY_TRACT | Status: DC | PRN

## 2012-06-13 NOTE — Telephone Encounter (Signed)
Called and spoke with pt and he is aware of rx sent to the pharmacy.  Nothing further is needed.

## 2012-06-16 ENCOUNTER — Other Ambulatory Visit: Payer: Self-pay

## 2012-06-16 MED ORDER — WARFARIN SODIUM 5 MG PO TABS: 5.0000 mg | ORAL_TABLET | ORAL | Status: DC

## 2012-06-16 MED ORDER — AMIODARONE HCL 200 MG PO TABS: ORAL_TABLET | ORAL | Status: DC

## 2012-06-25 ENCOUNTER — Ambulatory Visit (INDEPENDENT_AMBULATORY_CARE_PROVIDER_SITE_OTHER): Payer: Medicare Other | Admitting: Cardiovascular Disease

## 2012-06-25 DIAGNOSIS — I4891 Unspecified atrial fibrillation: Secondary | ICD-10-CM

## 2012-07-09 ENCOUNTER — Ambulatory Visit: Payer: Self-pay | Admitting: *Deleted

## 2012-07-09 DIAGNOSIS — I4891 Unspecified atrial fibrillation: Secondary | ICD-10-CM

## 2012-07-21 ENCOUNTER — Encounter: Payer: Medicare Other | Admitting: *Deleted

## 2012-07-30 ENCOUNTER — Ambulatory Visit: Payer: Self-pay | Admitting: Cardiology

## 2012-07-30 DIAGNOSIS — I4891 Unspecified atrial fibrillation: Secondary | ICD-10-CM

## 2012-07-30 LAB — POCT INR: INR: 2.6

## 2012-07-31 ENCOUNTER — Encounter: Payer: Self-pay | Admitting: *Deleted

## 2012-08-06 ENCOUNTER — Ambulatory Visit (INDEPENDENT_AMBULATORY_CARE_PROVIDER_SITE_OTHER): Payer: Medicare Other | Admitting: *Deleted

## 2012-08-06 ENCOUNTER — Encounter: Payer: Self-pay | Admitting: Internal Medicine

## 2012-08-06 ENCOUNTER — Other Ambulatory Visit: Payer: Self-pay | Admitting: Internal Medicine

## 2012-08-06 DIAGNOSIS — I498 Other specified cardiac arrhythmias: Secondary | ICD-10-CM

## 2012-08-06 DIAGNOSIS — Z950 Presence of cardiac pacemaker: Secondary | ICD-10-CM

## 2012-08-14 ENCOUNTER — Telehealth: Payer: Self-pay | Admitting: Internal Medicine

## 2012-08-14 NOTE — Telephone Encounter (Signed)
Pt keeps getting messages from mychart that he hasn't transmitted, he says he sent one 08-11-12, did you receive that?

## 2012-08-14 NOTE — Telephone Encounter (Signed)
Transmission received, patient aware. 

## 2012-08-18 ENCOUNTER — Telehealth: Payer: Self-pay | Admitting: Cardiology

## 2012-08-18 LAB — REMOTE PACEMAKER DEVICE
AL IMPEDENCE PM: 486 Ohm
AL THRESHOLD: 0.625 V
BATTERY VOLTAGE: 2.79 V
RV LEAD AMPLITUDE: 16 mv
RV LEAD IMPEDENCE PM: 521 Ohm
RV LEAD THRESHOLD: 1.125 V

## 2012-08-18 NOTE — Telephone Encounter (Signed)
Okay to leave off Coumadin for 5 days to allow for an epidural.  He does not need Lovenox bridging.

## 2012-08-18 NOTE — Telephone Encounter (Signed)
Will fax as requested

## 2012-08-18 NOTE — Telephone Encounter (Signed)
Will forward to  Dr. Brackbill for review 

## 2012-08-18 NOTE — Telephone Encounter (Signed)
New problem   Pt need permission to get off coumadin for 4-5 day so he can have a epidural, Please fax to [facility] at [phone] Dr Manon Hilding.

## 2012-08-19 ENCOUNTER — Telehealth: Payer: Self-pay | Admitting: Cardiology

## 2012-08-19 NOTE — Telephone Encounter (Signed)
Pt calling re fax not received by dr bodea's office yesterday, can you pls fax again?

## 2012-08-19 NOTE — Telephone Encounter (Signed)
Sent again as requested.

## 2012-08-27 ENCOUNTER — Ambulatory Visit (INDEPENDENT_AMBULATORY_CARE_PROVIDER_SITE_OTHER): Payer: Medicare Other | Admitting: Cardiology

## 2012-08-27 DIAGNOSIS — I4891 Unspecified atrial fibrillation: Secondary | ICD-10-CM

## 2012-08-29 ENCOUNTER — Encounter: Payer: Self-pay | Admitting: *Deleted

## 2012-09-03 ENCOUNTER — Ambulatory Visit (INDEPENDENT_AMBULATORY_CARE_PROVIDER_SITE_OTHER): Payer: Medicare Other | Admitting: Internal Medicine

## 2012-09-03 DIAGNOSIS — I4891 Unspecified atrial fibrillation: Secondary | ICD-10-CM

## 2012-09-10 ENCOUNTER — Other Ambulatory Visit (HOSPITAL_COMMUNITY): Payer: Self-pay | Admitting: Geriatric Medicine

## 2012-09-10 ENCOUNTER — Ambulatory Visit (INDEPENDENT_AMBULATORY_CARE_PROVIDER_SITE_OTHER): Payer: Medicare Other | Admitting: Internal Medicine

## 2012-09-10 DIAGNOSIS — Z8546 Personal history of malignant neoplasm of prostate: Secondary | ICD-10-CM

## 2012-09-10 DIAGNOSIS — M549 Dorsalgia, unspecified: Secondary | ICD-10-CM

## 2012-09-10 DIAGNOSIS — I4891 Unspecified atrial fibrillation: Secondary | ICD-10-CM

## 2012-09-16 ENCOUNTER — Encounter (HOSPITAL_COMMUNITY): Payer: Medicare Other

## 2012-09-16 ENCOUNTER — Ambulatory Visit (HOSPITAL_COMMUNITY): Payer: Medicare Other

## 2012-09-17 ENCOUNTER — Encounter (HOSPITAL_COMMUNITY)
Admission: RE | Admit: 2012-09-17 | Discharge: 2012-09-17 | Disposition: A | Discharge status: Home / Self Care | Payer: Medicare Other | Source: Ambulatory Visit | Attending: Geriatric Medicine | Admitting: Geriatric Medicine

## 2012-09-17 DIAGNOSIS — C679 Malignant neoplasm of bladder, unspecified: Secondary | ICD-10-CM | POA: Insufficient documentation

## 2012-09-17 DIAGNOSIS — Z8546 Personal history of malignant neoplasm of prostate: Secondary | ICD-10-CM

## 2012-09-17 DIAGNOSIS — C61 Malignant neoplasm of prostate: Secondary | ICD-10-CM | POA: Insufficient documentation

## 2012-09-17 DIAGNOSIS — M549 Dorsalgia, unspecified: Secondary | ICD-10-CM

## 2012-09-17 MED ORDER — TECHNETIUM TC 99M MEDRONATE IV KIT
25.0000 | PACK | Freq: Once | INTRAVENOUS | Status: AC | PRN
  Administered 2012-09-17: 25 via INTRAVENOUS

## 2012-09-24 ENCOUNTER — Other Ambulatory Visit: Payer: Self-pay | Admitting: Orthopedic Surgery

## 2012-09-24 ENCOUNTER — Ambulatory Visit (INDEPENDENT_AMBULATORY_CARE_PROVIDER_SITE_OTHER): Payer: Medicare Other | Admitting: Cardiovascular Disease

## 2012-09-24 DIAGNOSIS — M549 Dorsalgia, unspecified: Secondary | ICD-10-CM

## 2012-09-24 DIAGNOSIS — I4891 Unspecified atrial fibrillation: Secondary | ICD-10-CM

## 2012-09-24 LAB — POCT INR: INR: 1.5

## 2012-09-25 ENCOUNTER — Telehealth: Payer: Self-pay | Admitting: Cardiology

## 2012-09-25 NOTE — Telephone Encounter (Signed)
New problem     Danielle calling to f/u on fax to approve pt to be off coumadin for 4 days-xfer'd call to [facility]

## 2012-09-26 NOTE — Progress Notes (Signed)
[facility]:  Patient called at 1315 to inquire about status of myelogram.  Told him we still had not heard from Dr. Yevonne Pax office from fax sent 09/24/12 and phone call 09/25/12 seeking permission for patient to hold Coumadin for procedure.  He stated this has been going on "for a week" and wanted to know how much longer it would take.  I informed him that he may have seen Dr. Yevette Edwards a week ago, but we had not received the faxed order from his office to do the myelogram until just two days ago (09/24/12) and acted upon the blood thinner medication asap.  [name]

## 2012-09-26 NOTE — Telephone Encounter (Signed)
Forms received will forward to  Dr. Patty Sermons for review

## 2012-09-26 NOTE — Telephone Encounter (Signed)
Follow up     Has [facility] contact you regarding holding coumadin.

## 2012-09-29 ENCOUNTER — Telehealth: Payer: Self-pay | Admitting: Cardiology

## 2012-09-29 NOTE — Telephone Encounter (Signed)
New Problem:    Patient called in because [facility] called him and asked him to call in and request the status of a faxed release for him to stop his coumadin prior to an upcoming procedure.  Please call back.

## 2012-09-30 ENCOUNTER — Encounter: Payer: Self-pay | Admitting: Cardiology

## 2012-09-30 NOTE — Telephone Encounter (Signed)
Will await  Dr. Yevonne Pax response

## 2012-10-01 NOTE — Telephone Encounter (Signed)
Okay to hold Coumadin for 4 days

## 2012-10-03 NOTE — Telephone Encounter (Signed)
Faxed over and advised patient

## 2012-10-03 NOTE — Telephone Encounter (Signed)
Ok per  Dr. Patty Sermons   Faxed over and advised patient

## 2012-10-07 ENCOUNTER — Ambulatory Visit (INDEPENDENT_AMBULATORY_CARE_PROVIDER_SITE_OTHER): Payer: Medicare Other | Admitting: Pharmacist

## 2012-10-07 ENCOUNTER — Ambulatory Visit
Admission: RE | Admit: 2012-10-07 | Discharge: 2012-10-07 | Disposition: A | Discharge status: Home / Self Care | Payer: Medicare Other | Source: Ambulatory Visit | Attending: Orthopedic Surgery | Admitting: Orthopedic Surgery

## 2012-10-07 VITALS — BP 110/67 | HR 77

## 2012-10-07 DIAGNOSIS — M545 Low back pain: Secondary | ICD-10-CM

## 2012-10-07 DIAGNOSIS — M549 Dorsalgia, unspecified: Secondary | ICD-10-CM

## 2012-10-07 DIAGNOSIS — I4891 Unspecified atrial fibrillation: Secondary | ICD-10-CM

## 2012-10-07 MED ORDER — IOHEXOL 180 MG/ML  SOLN
18.0000 mL | Freq: Once | INTRAMUSCULAR | Status: AC | PRN
  Administered 2012-10-07: 18 mL via INTRATHECAL

## 2012-10-07 MED ORDER — MEPERIDINE HCL 100 MG/ML IJ SOLN
50.0000 mg | Freq: Once | INTRAMUSCULAR | Status: AC
  Administered 2012-10-07: 50 mg via INTRAMUSCULAR

## 2012-10-07 MED ORDER — ONDANSETRON HCL 4 MG/2ML IJ SOLN
4.0000 mg | Freq: Once | INTRAMUSCULAR | Status: AC
  Administered 2012-10-07: 4 mg via INTRAMUSCULAR

## 2012-10-07 NOTE — Progress Notes (Signed)
Pt states he has been off escitelopram for 3 days and coumadin for 4 days. INR is 1.1.

## 2012-10-13 ENCOUNTER — Telehealth: Payer: Self-pay | Admitting: Cardiology

## 2012-10-13 NOTE — Telephone Encounter (Signed)
Patient is considering back surgery with [facility]. Wants to be seen today or Thursday afternoon for surgical clearance. No surgery scheduled at this time.

## 2012-10-13 NOTE — Telephone Encounter (Signed)
Will send clearance to Dr. Estill Bamberg

## 2012-10-13 NOTE — Telephone Encounter (Signed)
New problem   Pt need you to call him concerning his back sx.Please call pt

## 2012-10-13 NOTE — Telephone Encounter (Signed)
Dr. Patty Sermons reviewed patients chart, echo November 2013 and does not need any further cardiac testing. Patient ok for surgery from cardiac standpoint and may hold warfarin 5 days prior to procedure. Patient advised

## 2012-10-13 NOTE — Telephone Encounter (Signed)
Agree with information dispensed.

## 2012-10-15 ENCOUNTER — Ambulatory Visit (INDEPENDENT_AMBULATORY_CARE_PROVIDER_SITE_OTHER): Payer: Medicare Other | Admitting: Cardiovascular Disease

## 2012-10-15 DIAGNOSIS — I4891 Unspecified atrial fibrillation: Secondary | ICD-10-CM

## 2012-10-15 LAB — POCT INR: INR: 1.8

## 2012-10-16 ENCOUNTER — Other Ambulatory Visit: Payer: Self-pay | Admitting: Orthopedic Surgery

## 2012-10-22 ENCOUNTER — Encounter (HOSPITAL_COMMUNITY): Payer: Self-pay | Admitting: Pharmacist

## 2012-10-28 NOTE — Pre-Procedure Instructions (Signed)
Peter Richards  10/28/2012   Your procedure is scheduled on:  Thursday, June 5th  Report to [facility] at 5:30 AM.             [name] will come through Entrance "A", follow signs/hallway to EAST Elevators and go to 3rd floor)   Call this number if you have problems the morning of surgery: [phone]   Remember:   Do not eat food or drink liquids after midnight Wednesday.   Take these medicines the morning of surgery with A SIP OF WATER: Lexapro, Gabapentin, Hydrocodone, Metoprolol, Spiriva   Do not wear jewelry.  Do not wear lotions, powders, or colognes. You may NOT wear deodorant.   Men may shave face and neck.   Do not bring valuables to the hospital.  Contacts, dentures or bridgework may not be worn into surgery.   Leave suitcase in the car. After surgery it may be brought to your room.  For patients admitted to the hospital, checkout time is 11:00 AM the day of discharge.   Name and phone number of your driver:    Special Instructions: Shower using CHG 2 nights before surgery and the night before surgery.  If you shower the day of surgery use CHG.  Use special wash - you have one bottle of CHG for all showers.  You should use approximately 1/3 of the bottle for each shower.   Please read over the following fact sheets that you were given: Pain Booklet, Coughing and Deep Breathing, Blood Transfusion Information, MRSA Information and Surgical Site Infection Prevention

## 2012-10-29 ENCOUNTER — Ambulatory Visit (HOSPITAL_COMMUNITY)
Admission: RE | Admit: 2012-10-29 | Discharge: 2012-10-29 | Disposition: A | Discharge status: Home / Self Care | Payer: Medicare Other | Source: Ambulatory Visit | Attending: Orthopedic Surgery | Admitting: Orthopedic Surgery

## 2012-10-29 ENCOUNTER — Encounter (HOSPITAL_COMMUNITY): Payer: Self-pay

## 2012-10-29 ENCOUNTER — Encounter (HOSPITAL_COMMUNITY)
Admission: RE | Admit: 2012-10-29 | Discharge: 2012-10-29 | Disposition: A | Discharge status: Home / Self Care | Payer: Medicare Other | Source: Ambulatory Visit | Attending: Orthopedic Surgery | Admitting: Orthopedic Surgery

## 2012-10-29 DIAGNOSIS — Z01810 Encounter for preprocedural cardiovascular examination: Secondary | ICD-10-CM | POA: Insufficient documentation

## 2012-10-29 DIAGNOSIS — R9431 Abnormal electrocardiogram [ECG] [EKG]: Secondary | ICD-10-CM | POA: Insufficient documentation

## 2012-10-29 DIAGNOSIS — Z01818 Encounter for other preprocedural examination: Secondary | ICD-10-CM | POA: Insufficient documentation

## 2012-10-29 DIAGNOSIS — Z01812 Encounter for preprocedural laboratory examination: Secondary | ICD-10-CM | POA: Insufficient documentation

## 2012-10-29 HISTORY — DX: Unspecified hearing loss, unspecified ear: H91.90

## 2012-10-29 HISTORY — DX: Other specified postprocedural states: Z98.890

## 2012-10-29 HISTORY — DX: Presence of cardiac pacemaker: Z95.0

## 2012-10-29 LAB — COMPREHENSIVE METABOLIC PANEL
BUN: 18 mg/dL (ref 6–23)
Calcium: 9.9 mg/dL (ref 8.4–10.5)
Creatinine, Ser: 1.01 mg/dL (ref 0.50–1.35)
GFR calc Af Amer: 78 mL/min — ABNORMAL LOW (ref >90)
GFR calc non Af Amer: 67 mL/min — ABNORMAL LOW (ref >90)
Glucose, Bld: 96 mg/dL (ref 70–99)
Sodium: 140 mEq/L (ref 135–145)
Total Protein: 7.8 g/dL (ref 6.0–8.3)

## 2012-10-29 LAB — CBC WITH DIFFERENTIAL/PLATELET
Eosinophils Absolute: 0.3 10*3/uL (ref 0.0–0.7)
Eosinophils Relative: 3 % (ref 0–5)
HCT: 39.7 % (ref 39.0–52.0)
Lymphs Abs: 2.9 10*3/uL (ref 0.7–4.0)
MCH: 31.1 pg (ref 26.0–34.0)
MCV: 90.6 fL (ref 78.0–100.0)
Monocytes Absolute: 1.2 10*3/uL — ABNORMAL HIGH (ref 0.1–1.0)
Monocytes Relative: 13 % — ABNORMAL HIGH (ref 3–12)
Platelets: 232 10*3/uL (ref 150–400)
RBC: 4.38 MIL/uL (ref 4.22–5.81)

## 2012-10-29 LAB — URINALYSIS, ROUTINE W REFLEX MICROSCOPIC
Bilirubin Urine: NEGATIVE
Glucose, UA: NEGATIVE mg/dL
Protein, ur: NEGATIVE mg/dL
Urobilinogen, UA: 0.2 mg/dL (ref 0.0–1.0)

## 2012-10-29 LAB — PROTIME-INR
INR: 1.66 — ABNORMAL HIGH (ref 0.00–1.49)
Prothrombin Time: 19.1 seconds — ABNORMAL HIGH (ref 11.6–15.2)

## 2012-10-29 LAB — URINE MICROSCOPIC-ADD ON

## 2012-10-29 LAB — SURGICAL PCR SCREEN: MRSA, PCR: NEGATIVE

## 2012-10-29 LAB — TYPE AND SCREEN
ABO/RH(D): A NEG
Antibody Screen: NEGATIVE

## 2012-10-29 NOTE — Progress Notes (Signed)
Pt sees Dr. Patty Sermons and the last time was 3 mths ago.   Had medtronic pacer...form sent and received back..have contacted rep.Marland KitchenMarland KitchenMarland KitchenBryna Colander [phone].   ..denies any chest pain, sob, etc.    He also had AAA with stent placed, pt states in 1999 by Dr. Excell Seltzer, but I believe that is not correct.[name].DA

## 2012-10-30 ENCOUNTER — Ambulatory Visit (INDEPENDENT_AMBULATORY_CARE_PROVIDER_SITE_OTHER): Payer: Medicare Other | Admitting: Internal Medicine

## 2012-10-30 DIAGNOSIS — I4891 Unspecified atrial fibrillation: Secondary | ICD-10-CM

## 2012-10-30 LAB — POCT INR: INR: 1.9

## 2012-10-31 LAB — URINE CULTURE: Colony Count: >=100000

## 2012-11-04 NOTE — Progress Notes (Signed)
Notified of arrival time of 0530 on 11/06/2012

## 2012-11-05 MED ORDER — CEFAZOLIN SODIUM-DEXTROSE 2-3 GM-% IV SOLR
2.0000 g | INTRAVENOUS | Status: AC
  Administered 2012-11-06: 2 g via INTRAVENOUS
  Filled 2012-11-05: qty 50

## 2012-11-05 MED ORDER — POVIDONE-IODINE 7.5 % EX SOLN
Freq: Once | CUTANEOUS | Status: DC
  Filled 2012-11-05: qty 118

## 2012-11-06 ENCOUNTER — Ambulatory Visit (HOSPITAL_COMMUNITY): Payer: Medicare Other | Admitting: Anesthesiology

## 2012-11-06 ENCOUNTER — Ambulatory Visit (HOSPITAL_COMMUNITY): Payer: Medicare Other

## 2012-11-06 ENCOUNTER — Inpatient Hospital Stay (HOSPITAL_COMMUNITY)
Admission: RE | Admit: 2012-11-06 | Discharge: 2012-11-10 | DRG: 460 | Disposition: A | Discharge status: Skilled Nursing Facility | Payer: Medicare Other | Source: Ambulatory Visit | Attending: Orthopedic Surgery | Admitting: Orthopedic Surgery

## 2012-11-06 ENCOUNTER — Encounter (HOSPITAL_COMMUNITY)
Admission: RE | Disposition: A | Discharge status: Skilled Nursing Facility | Payer: Self-pay | Source: Ambulatory Visit | Attending: Orthopedic Surgery

## 2012-11-06 ENCOUNTER — Encounter (HOSPITAL_COMMUNITY): Payer: Self-pay | Admitting: *Deleted

## 2012-11-06 ENCOUNTER — Encounter (HOSPITAL_COMMUNITY): Payer: Self-pay | Admitting: Anesthesiology

## 2012-11-06 DIAGNOSIS — Z79899 Other long term (current) drug therapy: Secondary | ICD-10-CM

## 2012-11-06 DIAGNOSIS — I10 Essential (primary) hypertension: Secondary | ICD-10-CM | POA: Diagnosis present

## 2012-11-06 DIAGNOSIS — Z800 Family history of malignant neoplasm of digestive organs: Secondary | ICD-10-CM

## 2012-11-06 DIAGNOSIS — M48062 Spinal stenosis, lumbar region with neurogenic claudication: Principal | ICD-10-CM | POA: Diagnosis present

## 2012-11-06 DIAGNOSIS — Z8673 Personal history of transient ischemic attack (TIA), and cerebral infarction without residual deficits: Secondary | ICD-10-CM

## 2012-11-06 DIAGNOSIS — Z8546 Personal history of malignant neoplasm of prostate: Secondary | ICD-10-CM

## 2012-11-06 DIAGNOSIS — Z801 Family history of malignant neoplasm of trachea, bronchus and lung: Secondary | ICD-10-CM

## 2012-11-06 DIAGNOSIS — H9190 Unspecified hearing loss, unspecified ear: Secondary | ICD-10-CM | POA: Diagnosis present

## 2012-11-06 DIAGNOSIS — Z9079 Acquired absence of other genital organ(s): Secondary | ICD-10-CM

## 2012-11-06 DIAGNOSIS — Z7901 Long term (current) use of anticoagulants: Secondary | ICD-10-CM

## 2012-11-06 DIAGNOSIS — F3289 Other specified depressive episodes: Secondary | ICD-10-CM | POA: Diagnosis present

## 2012-11-06 DIAGNOSIS — K219 Gastro-esophageal reflux disease without esophagitis: Secondary | ICD-10-CM | POA: Diagnosis present

## 2012-11-06 DIAGNOSIS — Z936 Other artificial openings of urinary tract status: Secondary | ICD-10-CM

## 2012-11-06 DIAGNOSIS — M810 Age-related osteoporosis without current pathological fracture: Secondary | ICD-10-CM | POA: Diagnosis present

## 2012-11-06 DIAGNOSIS — Z951 Presence of aortocoronary bypass graft: Secondary | ICD-10-CM

## 2012-11-06 DIAGNOSIS — Z9849 Cataract extraction status, unspecified eye: Secondary | ICD-10-CM

## 2012-11-06 DIAGNOSIS — Z87891 Personal history of nicotine dependence: Secondary | ICD-10-CM

## 2012-11-06 DIAGNOSIS — I4891 Unspecified atrial fibrillation: Secondary | ICD-10-CM | POA: Diagnosis present

## 2012-11-06 DIAGNOSIS — I2510 Atherosclerotic heart disease of native coronary artery without angina pectoris: Secondary | ICD-10-CM | POA: Diagnosis present

## 2012-11-06 DIAGNOSIS — Z9089 Acquired absence of other organs: Secondary | ICD-10-CM

## 2012-11-06 DIAGNOSIS — F329 Major depressive disorder, single episode, unspecified: Secondary | ICD-10-CM | POA: Diagnosis present

## 2012-11-06 DIAGNOSIS — Z8551 Personal history of malignant neoplasm of bladder: Secondary | ICD-10-CM

## 2012-11-06 DIAGNOSIS — E785 Hyperlipidemia, unspecified: Secondary | ICD-10-CM | POA: Diagnosis present

## 2012-11-06 DIAGNOSIS — I252 Old myocardial infarction: Secondary | ICD-10-CM

## 2012-11-06 DIAGNOSIS — Z933 Colostomy status: Secondary | ICD-10-CM

## 2012-11-06 DIAGNOSIS — Z950 Presence of cardiac pacemaker: Secondary | ICD-10-CM

## 2012-11-06 DIAGNOSIS — Q762 Congenital spondylolisthesis: Secondary | ICD-10-CM

## 2012-11-06 DIAGNOSIS — Z961 Presence of intraocular lens: Secondary | ICD-10-CM

## 2012-11-06 HISTORY — PX: LUMBAR LAMINECTOMY/DECOMPRESSION MICRODISCECTOMY: SHX5026

## 2012-11-06 HISTORY — DX: Encounter for attention to other artificial openings of urinary tract: Z43.6

## 2012-11-06 HISTORY — DX: Anxiety disorder, unspecified: F41.9

## 2012-11-06 HISTORY — DX: Unspecified atrial fibrillation: I48.91

## 2012-11-06 HISTORY — DX: Shortness of breath: R06.02

## 2012-11-06 HISTORY — DX: Other chronic pain: G89.29

## 2012-11-06 HISTORY — DX: Basal cell carcinoma of skin, unspecified: C44.91

## 2012-11-06 HISTORY — PX: POSTERIOR LUMBAR FUSION: SHX6036

## 2012-11-06 HISTORY — DX: Radiculopathy, lumbar region: M54.16

## 2012-11-06 HISTORY — DX: Malignant neoplasm of prostate: C61

## 2012-11-06 HISTORY — DX: Malignant neoplasm of bladder, unspecified: C67.9

## 2012-11-06 LAB — PROTIME-INR: INR: 1.13 (ref 0.00–1.49)

## 2012-11-06 SURGERY — POSTERIOR LUMBAR FUSION 1 LEVEL
Anesthesia: General | Site: Spine Lumbar | Wound class: Clean

## 2012-11-06 MED ORDER — MORPHINE SULFATE (PF) 1 MG/ML IV SOLN
INTRAVENOUS | Status: DC
  Administered 2012-11-06: 12 mg via INTRAVENOUS
  Administered 2012-11-06: 23 mL via INTRAVENOUS
  Administered 2012-11-07 (×2): 1 mg via INTRAVENOUS

## 2012-11-06 MED ORDER — SIMVASTATIN 10 MG PO TABS
10.0000 mg | ORAL_TABLET | Freq: Every day | ORAL | Status: DC
  Administered 2012-11-06 – 2012-11-09 (×4): 10 mg via ORAL
  Filled 2012-11-06 (×5): qty 1

## 2012-11-06 MED ORDER — THROMBIN 20000 UNITS EX KIT
PACK | CUTANEOUS | Status: DC | PRN
  Administered 2012-11-06: 10:00:00 via TOPICAL

## 2012-11-06 MED ORDER — FLEET ENEMA 7-19 GM/118ML RE ENEM: 1.0000 | ENEMA | Freq: Once | RECTAL | Status: AC | PRN

## 2012-11-06 MED ORDER — LACTATED RINGERS IV SOLN
INTRAVENOUS | Status: DC | PRN
  Administered 2012-11-06 (×2): via INTRAVENOUS

## 2012-11-06 MED ORDER — DIPHENHYDRAMINE HCL 50 MG/ML IJ SOLN: 25.0000 mg | Freq: Four times a day (QID) | INTRAMUSCULAR | Status: DC | PRN

## 2012-11-06 MED ORDER — NITROGLYCERIN 0.4 MG SL SUBL: 0.4000 mg | SUBLINGUAL_TABLET | SUBLINGUAL | Status: DC | PRN

## 2012-11-06 MED ORDER — ALBUMIN HUMAN 5 % IV SOLN
INTRAVENOUS | Status: DC | PRN
  Administered 2012-11-06: 10:00:00 via INTRAVENOUS

## 2012-11-06 MED ORDER — FENTANYL CITRATE 0.05 MG/ML IJ SOLN: 25.0000 ug | INTRAMUSCULAR | Status: DC | PRN

## 2012-11-06 MED ORDER — FENTANYL CITRATE 0.05 MG/ML IJ SOLN
INTRAMUSCULAR | Status: DC | PRN
  Administered 2012-11-06 (×3): 50 ug via INTRAVENOUS
  Administered 2012-11-06: 150 ug via INTRAVENOUS

## 2012-11-06 MED ORDER — ONDANSETRON HCL 4 MG/2ML IJ SOLN: 4.0000 mg | Freq: Four times a day (QID) | INTRAMUSCULAR | Status: DC | PRN

## 2012-11-06 MED ORDER — NEOSTIGMINE METHYLSULFATE 1 MG/ML IJ SOLN
INTRAMUSCULAR | Status: DC | PRN
  Administered 2012-11-06: 4 mg via INTRAVENOUS

## 2012-11-06 MED ORDER — TIOTROPIUM BROMIDE MONOHYDRATE 18 MCG IN CAPS
18.0000 ug | ORAL_CAPSULE | Freq: Every day | RESPIRATORY_TRACT | Status: DC | PRN
  Filled 2012-11-06: qty 5

## 2012-11-06 MED ORDER — ALUM & MAG HYDROXIDE-SIMETH 200-200-20 MG/5ML PO SUSP: 30.0000 mL | Freq: Four times a day (QID) | ORAL | Status: DC | PRN

## 2012-11-06 MED ORDER — HEMOSTATIC AGENTS (NO CHARGE) OPTIME
TOPICAL | Status: DC | PRN
  Administered 2012-11-06: 1

## 2012-11-06 MED ORDER — DIAZEPAM 5 MG PO TABS
5.0000 mg | ORAL_TABLET | Freq: Four times a day (QID) | ORAL | Status: DC | PRN
  Administered 2012-11-07 – 2012-11-09 (×5): 5 mg via ORAL
  Filled 2012-11-06 (×6): qty 1

## 2012-11-06 MED ORDER — PHENOL 1.4 % MT LIQD: 1.0000 | OROMUCOSAL | Status: DC | PRN

## 2012-11-06 MED ORDER — ZOLPIDEM TARTRATE 5 MG PO TABS: 5.0000 mg | ORAL_TABLET | Freq: Every evening | ORAL | Status: DC | PRN

## 2012-11-06 MED ORDER — ACETAMINOPHEN 325 MG PO TABS
650.0000 mg | ORAL_TABLET | ORAL | Status: DC | PRN
  Administered 2012-11-07 – 2012-11-10 (×10): 650 mg via ORAL
  Filled 2012-11-06 (×10): qty 2

## 2012-11-06 MED ORDER — SODIUM CHLORIDE 0.9 % IV SOLN
INTRAVENOUS | Status: DC
  Administered 2012-11-07 (×2): via INTRAVENOUS

## 2012-11-06 MED ORDER — DIPHENHYDRAMINE HCL 25 MG PO CAPS: 25.0000 mg | ORAL_CAPSULE | Freq: Four times a day (QID) | ORAL | Status: DC | PRN

## 2012-11-06 MED ORDER — METOPROLOL SUCCINATE ER 25 MG PO TB24
25.0000 mg | ORAL_TABLET | Freq: Every day | ORAL | Status: DC
  Administered 2012-11-07 – 2012-11-10 (×4): 25 mg via ORAL
  Filled 2012-11-06 (×4): qty 1

## 2012-11-06 MED ORDER — PHENYLEPHRINE HCL 10 MG/ML IJ SOLN
INTRAMUSCULAR | Status: DC | PRN
  Administered 2012-11-06 (×2): 80 ug via INTRAVENOUS
  Administered 2012-11-06: 40 ug via INTRAVENOUS
  Administered 2012-11-06: 80 ug via INTRAVENOUS
  Administered 2012-11-06: 40 ug via INTRAVENOUS
  Administered 2012-11-06 (×3): 80 ug via INTRAVENOUS

## 2012-11-06 MED ORDER — GABAPENTIN 100 MG PO CAPS
100.0000 mg | ORAL_CAPSULE | Freq: Every day | ORAL | Status: DC
  Administered 2012-11-06 – 2012-11-09 (×4): 100 mg via ORAL
  Filled 2012-11-06 (×6): qty 1

## 2012-11-06 MED ORDER — SENNOSIDES-DOCUSATE SODIUM 8.6-50 MG PO TABS: 1.0000 | ORAL_TABLET | Freq: Every evening | ORAL | Status: DC | PRN

## 2012-11-06 MED ORDER — CEFAZOLIN SODIUM 1-5 GM-% IV SOLN
1.0000 g | Freq: Three times a day (TID) | INTRAVENOUS | Status: AC
  Administered 2012-11-06 – 2012-11-07 (×2): 1 g via INTRAVENOUS
  Filled 2012-11-06 (×2): qty 50

## 2012-11-06 MED ORDER — THROMBIN 20000 UNITS EX SOLR
CUTANEOUS | Status: DC | PRN
  Administered 2012-11-06: 20000 [IU] via TOPICAL

## 2012-11-06 MED ORDER — BUPIVACAINE-EPINEPHRINE 0.25% -1:200000 IJ SOLN
INTRAMUSCULAR | Status: DC | PRN
  Administered 2012-11-06: 30 mL

## 2012-11-06 MED ORDER — ZOLPIDEM TARTRATE 5 MG PO TABS
5.0000 mg | ORAL_TABLET | Freq: Every day | ORAL | Status: DC
  Administered 2012-11-06 – 2012-11-09 (×4): 5 mg via ORAL
  Filled 2012-11-06 (×4): qty 1

## 2012-11-06 MED ORDER — BISACODYL 5 MG PO TBEC: 5.0000 mg | DELAYED_RELEASE_TABLET | Freq: Every day | ORAL | Status: DC | PRN

## 2012-11-06 MED ORDER — LIDOCAINE HCL (CARDIAC) 20 MG/ML IV SOLN
INTRAVENOUS | Status: DC | PRN
  Administered 2012-11-06: 100 mg via INTRAVENOUS

## 2012-11-06 MED ORDER — DIPHENHYDRAMINE HCL 12.5 MG/5ML PO ELIX: 12.5000 mg | ORAL_SOLUTION | Freq: Four times a day (QID) | ORAL | Status: DC | PRN

## 2012-11-06 MED ORDER — MORPHINE SULFATE (PF) 1 MG/ML IV SOLN
INTRAVENOUS | Status: AC
  Filled 2012-11-06: qty 25

## 2012-11-06 MED ORDER — THROMBIN 20000 UNITS EX SOLR
CUTANEOUS | Status: AC
  Filled 2012-11-06: qty 40000

## 2012-11-06 MED ORDER — SODIUM CHLORIDE 0.9 % IJ SOLN: 3.0000 mL | INTRAMUSCULAR | Status: DC | PRN

## 2012-11-06 MED ORDER — THROMBIN 20000 UNITS EX SOLR
CUTANEOUS | Status: DC | PRN
  Administered 2012-11-06: 09:00:00

## 2012-11-06 MED ORDER — BUPIVACAINE-EPINEPHRINE PF 0.25-1:200000 % IJ SOLN
INTRAMUSCULAR | Status: AC
  Filled 2012-11-06: qty 30

## 2012-11-06 MED ORDER — OXYCODONE-ACETAMINOPHEN 5-325 MG PO TABS
1.0000 | ORAL_TABLET | ORAL | Status: DC | PRN
  Administered 2012-11-07: 2 via ORAL
  Administered 2012-11-07 – 2012-11-09 (×9): 1 via ORAL
  Administered 2012-11-10: 2 via ORAL
  Filled 2012-11-06 (×9): qty 1
  Filled 2012-11-06 (×2): qty 2
  Filled 2012-11-06: qty 1

## 2012-11-06 MED ORDER — ROCURONIUM BROMIDE 100 MG/10ML IV SOLN
INTRAVENOUS | Status: DC | PRN
  Administered 2012-11-06: 20 mg via INTRAVENOUS
  Administered 2012-11-06 (×2): 10 mg via INTRAVENOUS
  Administered 2012-11-06: 50 mg via INTRAVENOUS
  Administered 2012-11-06: 20 mg via INTRAVENOUS

## 2012-11-06 MED ORDER — ESCITALOPRAM OXALATE 20 MG PO TABS
20.0000 mg | ORAL_TABLET | Freq: Every day | ORAL | Status: DC
  Administered 2012-11-07 – 2012-11-10 (×4): 20 mg via ORAL
  Filled 2012-11-06 (×4): qty 1

## 2012-11-06 MED ORDER — ONDANSETRON HCL 4 MG/2ML IJ SOLN
INTRAMUSCULAR | Status: DC | PRN
  Administered 2012-11-06: 4 mg via INTRAVENOUS

## 2012-11-06 MED ORDER — PROPOFOL INFUSION 10 MG/ML OPTIME
INTRAVENOUS | Status: DC | PRN
  Administered 2012-11-06: 50 ug/kg/min via INTRAVENOUS

## 2012-11-06 MED ORDER — ACETAMINOPHEN 650 MG RE SUPP: 650.0000 mg | RECTAL | Status: DC | PRN

## 2012-11-06 MED ORDER — SODIUM CHLORIDE 0.9 % IJ SOLN
3.0000 mL | Freq: Two times a day (BID) | INTRAMUSCULAR | Status: DC
  Administered 2012-11-08 – 2012-11-09 (×4): 3 mL via INTRAVENOUS

## 2012-11-06 MED ORDER — SODIUM CHLORIDE 0.9 % IV SOLN
250.0000 mL | INTRAVENOUS | Status: DC
  Administered 2012-11-06: 250 mL via INTRAVENOUS

## 2012-11-06 MED ORDER — ONDANSETRON HCL 4 MG/2ML IJ SOLN: 4.0000 mg | INTRAMUSCULAR | Status: DC | PRN

## 2012-11-06 MED ORDER — DOCUSATE SODIUM 100 MG PO CAPS
100.0000 mg | ORAL_CAPSULE | Freq: Two times a day (BID) | ORAL | Status: DC
  Administered 2012-11-06 – 2012-11-10 (×8): 100 mg via ORAL
  Filled 2012-11-06 (×10): qty 1

## 2012-11-06 MED ORDER — MORPHINE SULFATE 2 MG/ML IJ SOLN
1.0000 mg | INTRAMUSCULAR | Status: DC | PRN
  Administered 2012-11-10 (×2): 2 mg via INTRAVENOUS
  Filled 2012-11-06 (×3): qty 1

## 2012-11-06 MED ORDER — NALOXONE HCL 0.4 MG/ML IJ SOLN: 0.4000 mg | INTRAMUSCULAR | Status: DC | PRN

## 2012-11-06 MED ORDER — SODIUM CHLORIDE 0.9 % IJ SOLN: 9.0000 mL | INTRAMUSCULAR | Status: DC | PRN

## 2012-11-06 MED ORDER — DIPHENHYDRAMINE HCL 50 MG/ML IJ SOLN: 12.5000 mg | Freq: Four times a day (QID) | INTRAMUSCULAR | Status: DC | PRN

## 2012-11-06 MED ORDER — THROMBIN 20000 UNITS EX KIT: PACK | CUTANEOUS | Status: DC | PRN

## 2012-11-06 MED ORDER — 0.9 % SODIUM CHLORIDE (POUR BTL) OPTIME
TOPICAL | Status: DC | PRN
  Administered 2012-11-06: 3000 mL

## 2012-11-06 MED ORDER — THROMBIN 20000 UNITS EX SOLR
CUTANEOUS | Status: AC
  Filled 2012-11-06: qty 20000

## 2012-11-06 MED ORDER — POLYETHYLENE GLYCOL 3350 17 G PO PACK: 17.0000 g | PACK | Freq: Every day | ORAL | Status: DC | PRN

## 2012-11-06 MED ORDER — PROPOFOL 10 MG/ML IV BOLUS
INTRAVENOUS | Status: DC | PRN
  Administered 2012-11-06: 30 mg via INTRAVENOUS
  Administered 2012-11-06: 130 mg via INTRAVENOUS

## 2012-11-06 MED ORDER — GLYCOPYRROLATE 0.2 MG/ML IJ SOLN
INTRAMUSCULAR | Status: DC | PRN
  Administered 2012-11-06: 0.6 mg via INTRAVENOUS

## 2012-11-06 MED ORDER — MENTHOL 3 MG MT LOZG: 1.0000 | LOZENGE | OROMUCOSAL | Status: DC | PRN

## 2012-11-06 MED ORDER — PHENYLEPHRINE HCL 10 MG/ML IJ SOLN
10.0000 mg | INTRAVENOUS | Status: DC | PRN
  Administered 2012-11-06: 50 ug/min via INTRAVENOUS

## 2012-11-06 SURGICAL SUPPLY — 77 items
APL SKNCLS STERI-STRIP NONHPOA (GAUZE/BANDAGES/DRESSINGS) ×2
BENZOIN TINCTURE PRP APPL 2/3 (GAUZE/BANDAGES/DRESSINGS) ×3 IMPLANT
BLADE SURG ROTATE 9660 (MISCELLANEOUS) IMPLANT
BUR ROUND PRECISION 4.0 (BURR) ×3 IMPLANT
CARTRIDGE OIL MAESTRO DRILL (MISCELLANEOUS) ×2 IMPLANT
CLOTH BEACON ORANGE TIMEOUT ST (SAFETY) ×3 IMPLANT
CLSR STERI-STRIP ANTIMIC 1/2X4 (GAUZE/BANDAGES/DRESSINGS) ×1 IMPLANT
CONT SPEC STER OR (MISCELLANEOUS) ×3 IMPLANT
CORDS BIPOLAR (ELECTRODE) ×3 IMPLANT
COVER SURGICAL LIGHT HANDLE (MISCELLANEOUS) ×3 IMPLANT
DIFFUSER DRILL AIR PNEUMATIC (MISCELLANEOUS) ×3 IMPLANT
DRAIN CHANNEL 15F RND FF W/TCR (WOUND CARE) ×1 IMPLANT
DRAPE C-ARM 42X72 X-RAY (DRAPES) ×3 IMPLANT
DRAPE ORTHO SPLIT 77X108 STRL (DRAPES) ×3
DRAPE POUCH INSTRU U-SHP 10X18 (DRAPES) ×3 IMPLANT
DRAPE SURG 17X23 STRL (DRAPES) ×9 IMPLANT
DRAPE SURG ORHT 6 SPLT 77X108 (DRAPES) ×2 IMPLANT
DURAPREP 26ML APPLICATOR (WOUND CARE) ×3 IMPLANT
ELECT BLADE 4.0 EZ CLEAN MEGAD (MISCELLANEOUS) ×3
ELECT CAUTERY BLADE 6.4 (BLADE) ×3 IMPLANT
ELECT REM PT RETURN 9FT ADLT (ELECTROSURGICAL) ×3
ELECTRODE BLDE 4.0 EZ CLN MEGD (MISCELLANEOUS) ×2 IMPLANT
ELECTRODE REM PT RTRN 9FT ADLT (ELECTROSURGICAL) ×2 IMPLANT
EVACUATOR SILICONE 100CC (DRAIN) ×1 IMPLANT
GAUZE SPONGE 4X4 16PLY XRAY LF (GAUZE/BANDAGES/DRESSINGS) ×10 IMPLANT
GLOVE BIO SURGEON STRL SZ7 (GLOVE) ×3 IMPLANT
GLOVE BIO SURGEON STRL SZ8 (GLOVE) ×3 IMPLANT
GLOVE BIOGEL PI IND STRL 7.5 (GLOVE) ×2 IMPLANT
GLOVE BIOGEL PI IND STRL 8 (GLOVE) ×2 IMPLANT
GLOVE BIOGEL PI INDICATOR 7.5 (GLOVE) ×1
GLOVE BIOGEL PI INDICATOR 8 (GLOVE) ×1
GOWN STRL NON-REIN LRG LVL3 (GOWN DISPOSABLE) ×6 IMPLANT
GOWN STRL REIN XL XLG (GOWN DISPOSABLE) ×3 IMPLANT
IV CATH 14GX2 1/4 (CATHETERS) ×3 IMPLANT
KIT BASIN OR (CUSTOM PROCEDURE TRAY) ×3 IMPLANT
KIT POSITION SURG JACKSON T1 (MISCELLANEOUS) ×3 IMPLANT
KIT ROOM TURNOVER OR (KITS) ×3 IMPLANT
MARKER SKIN DUAL TIP RULER LAB (MISCELLANEOUS) ×3 IMPLANT
NDL HYPO 25GX1X1/2 BEV (NEEDLE) ×2 IMPLANT
NDL SPNL 18GX3.5 QUINCKE PK (NEEDLE) ×4 IMPLANT
NEEDLE BONE MARROW 8GX6 FENEST (NEEDLE) IMPLANT
NEEDLE HYPO 25GX1X1/2 BEV (NEEDLE) ×3 IMPLANT
NEEDLE SPNL 18GX3.5 QUINCKE PK (NEEDLE) ×6 IMPLANT
NS IRRIG 1000ML POUR BTL (IV SOLUTION) ×3 IMPLANT
OIL CARTRIDGE MAESTRO DRILL (MISCELLANEOUS) ×3
PACK LAMINECTOMY ORTHO (CUSTOM PROCEDURE TRAY) ×3 IMPLANT
PACK UNIVERSAL I (CUSTOM PROCEDURE TRAY) ×3 IMPLANT
PAD ARMBOARD 7.5X6 YLW CONV (MISCELLANEOUS) ×6 IMPLANT
PATTIES SURGICAL .5 X1 (DISPOSABLE) ×4 IMPLANT
PATTIES SURGICAL .5X1.5 (GAUZE/BANDAGES/DRESSINGS) ×2 IMPLANT
ROD PRE BENT EXP 40MM (Rod) ×1 IMPLANT
ROD PRE BENT EXPEDIUM 35MM (Rod) ×1 IMPLANT
SCREW EXPEDIUM POLYAXIAL 7X40M (Screw) ×4 IMPLANT
SCREW SET SINGLE INNER (Screw) ×4 IMPLANT
SPONGE GAUZE 4X4 12PLY (GAUZE/BANDAGES/DRESSINGS) ×3 IMPLANT
SPONGE INTESTINAL PEANUT (DISPOSABLE) ×3 IMPLANT
SPONGE LAP 4X18 X RAY DECT (DISPOSABLE) ×1 IMPLANT
SPONGE SURGIFOAM ABS GEL 100 (HEMOSTASIS) ×3 IMPLANT
STRIP CLOSURE SKIN 1/2X4 (GAUZE/BANDAGES/DRESSINGS) ×6 IMPLANT
SURGIFLO TRUKIT (HEMOSTASIS) IMPLANT
SURGIFLO W/THROMBIN 8M KIT (HEMOSTASIS) ×1 IMPLANT
SUT ETHILON 2 0 FS 18 (SUTURE) ×1 IMPLANT
SUT MNCRL AB 4-0 PS2 18 (SUTURE) ×6 IMPLANT
SUT VIC AB 0 CT1 18XCR BRD 8 (SUTURE) ×2 IMPLANT
SUT VIC AB 0 CT1 8-18 (SUTURE) ×3
SUT VIC AB 1 CT1 18XCR BRD 8 (SUTURE) ×4 IMPLANT
SUT VIC AB 1 CT1 8-18 (SUTURE) ×6
SUT VIC AB 2-0 CT2 18 VCP726D (SUTURE) ×4 IMPLANT
SYR 20CC LL (SYRINGE) ×3 IMPLANT
SYR BULB IRRIGATION 50ML (SYRINGE) ×3 IMPLANT
SYR CONTROL 10ML LL (SYRINGE) ×3 IMPLANT
TAPE CLOTH SURG 4X10 WHT LF (GAUZE/BANDAGES/DRESSINGS) ×1 IMPLANT
TOWEL OR 17X24 6PK STRL BLUE (TOWEL DISPOSABLE) ×3 IMPLANT
TOWEL OR 17X26 10 PK STRL BLUE (TOWEL DISPOSABLE) ×3 IMPLANT
TRAY FOLEY CATH 14FR (SET/KITS/TRAYS/PACK) ×2 IMPLANT
WATER STERILE IRR 1000ML POUR (IV SOLUTION) ×3 IMPLANT
YANKAUER SUCT BULB TIP NO VENT (SUCTIONS) ×3 IMPLANT

## 2012-11-06 NOTE — Progress Notes (Signed)
Patient looks good, sitting comfortably in bed. No leg pain. + LBP  Plan is to get up with PT/OT tomorrow and transfer to rehab over the weekend.

## 2012-11-06 NOTE — Plan of Care (Signed)
Problem: Consults Goal: Diagnosis - Spinal Surgery Lumbar Decompression L2-L5, Posterior Lumbar fusion L4-5

## 2012-11-06 NOTE — Anesthesia Preprocedure Evaluation (Addendum)
Anesthesia Evaluation  Patient identified by MRN, date of birth, ID band Patient awake    Reviewed: Allergy & Precautions, H&P , NPO status , Patient's Chart, lab work & pertinent test results, reviewed documented beta blocker date and time   History of Anesthesia Complications Negative for: history of anesthetic complications  Airway Mallampati: II TM Distance: >3 FB Neck ROM: Full    Dental  (+) Edentulous Upper and Dental Advisory Given   Pulmonary COPD COPD inhaler,    Pulmonary exam normal       Cardiovascular hypertension, Pt. on home beta blockers + CAD, + Past MI and + Peripheral Vascular Disease + dysrhythmias Atrial Fibrillation + pacemaker  Echo 2013 The estimated ejection fraction was in the range of 55% to 65%.    Neuro/Psych  Headaches, PSYCHIATRIC DISORDERS Depression CVA    GI/Hepatic Neg liver ROS, GERD-  ,  Endo/Other  negative endocrine ROS  Renal/GU negative Renal ROS     Musculoskeletal   Abdominal   Peds  Hematology   Anesthesia Other Findings   Reproductive/Obstetrics                         Anesthesia Physical Anesthesia Plan  ASA: III  Anesthesia Plan: General   Post-op Pain Management:    Induction: Intravenous  Airway Management Planned: Oral ETT  Additional Equipment: Arterial line  Intra-op Plan:   Post-operative Plan: Possible Post-op intubation/ventilation  Informed Consent: I have reviewed the patients History and Physical, chart, labs and discussed the procedure including the risks, benefits and alternatives for the proposed anesthesia with the patient or authorized representative who has indicated his/her understanding and acceptance.   Dental advisory given  Plan Discussed with: CRNA, Anesthesiologist and Surgeon  Anesthesia Plan Comments:        Anesthesia Quick Evaluation

## 2012-11-06 NOTE — Anesthesia Postprocedure Evaluation (Signed)
Anesthesia Post Note  Patient: Peter Richards  Procedure(s) Performed: Procedure(s) (LRB): POSTERIOR LUMBAR FUSION 1 LEVEL (N/A) LUMBAR LAMINECTOMY/DECOMPRESSION MICRODISCECTOMY (Bilateral)  Anesthesia type: general  Patient location: PACU  Post pain: Pain level controlled  Post assessment: Patient's Cardiovascular Status Stable  Last Vitals:  Filed Vitals:   11/06/12 1545  BP: 118/63  Pulse: 59  Temp:   Resp: 11    Post vital signs: Reviewed and stable  Level of consciousness: sedated  Complications: No apparent anesthesia complications

## 2012-11-06 NOTE — H&P (Signed)
PREOPERATIVE H&P  Chief Complaint: Bilateral leg pain  HPI: Peter Richards is a 79 y.o. male who presents with longstanding h/o bilateral leg pain  Past Medical History  Diagnosis Date  . Hyperlipidemia   . History of abdominal aortic aneurysm   . History of atrial fibrillation     on amiodarone and has PTVP in place  . Orthostatic hypotension   . Presence of permanent cardiac pacemaker   . Bradycardia   . Syncope     s/p PTVP  . Myocardial infarction     1981  . CAD (coronary artery disease)     prior CABG in 2002 negative Myoview in 2009  . Hypertension   . Stroke     TIA  2007    . Cancer     bladder/prostate  . GERD (gastroesophageal reflux disease)   . Headache(784.0)   . Arthritis     OSTEOPOROSIS  . Aortic stenosis, mild     by 01/08/08 echo  . Chronic anticoagulation   . Depression   . AAA (abdominal aortic aneurysm)   . Heart attack   . Pacemaker     medtronic pacer  . History of cervical spinal surgery     [place]  . S/P colostomy     SINCE 2006  . HOH (hard of hearing)     BILATERAL HEARING AIDS   Past Surgical History  Procedure Laterality Date  . Pacemaker insertion    . Insert / replace / remove pacemaker      11/2009  DR BRODIE    . Coronary artery bypass graft      1999  . No past surgeries    . Prostatectomy      BLADDER ALSO REMOVED (OSTOMY BAG)  2006  FOR CANCER     . Abdominal aortic aneurysm repair      1999 STENT PLACED    . Cataract extraction w/ intraocular lens  implant, bilateral    . Appendectomy      1959  . Revision urostomy cutaneous    . Revision urostomy cutaneous  2006    Urostomy bag placed on right lower abdomen  . Vertebroplasty  08/22/2011    Procedure: VERTEBROPLASTY;  Surgeon: Carmela Hurt, MD;  Location: [facility];  Service: Neurosurgery;  Laterality: N/A;  Lumbar Two vertebroplasty  . Cholecystectomy      4 YRS AGO   History   Social History  . Marital Status: Married    Spouse Name: N/A    Number of Children: 3  . Years of Education: N/A   Occupational History  . Retired    Social History Main Topics  . Smoking status: Former Smoker -- 3.00 packs/day for 50 years    Types: Cigarettes    Quit date: 06/04/1978  . Smokeless tobacco: Never Used  . Alcohol Use: No     Comment: rare "once a month"  . Drug Use: No  . Sexually Active: No   Other Topics Concern  . None   Social History Narrative  . None   Family History  Problem Relation Age of Onset  . Cancer Father     stomach  . Cancer Sister     lung   Allergies  Allergen Reactions  . Codeine Nausea And Vomiting  . Crestor (Rosuvastatin Calcium) Other (See Comments)    Leg cramps   Prior to Admission medications   Medication Sig Start Date End Date Taking? Authorizing Provider  escitalopram (LEXAPRO) 20 MG tablet Take 20 mg by mouth daily.    Yes Historical Provider, MD  gabapentin (NEURONTIN) 100 MG capsule Take 100 mg by mouth at bedtime.   Yes Historical Provider, MD  HYDROcodone-acetaminophen (NORCO) 5-325 MG per tablet Take 1 tablet by mouth every 4 (four) hours as needed for pain. For pain   Yes Historical Provider, MD  lidocaine (LIDODERM) 5 % Place 1 patch onto the skin daily as needed (for pain). Remove & Discard patch within 12 hours or as directed by MD   Yes Historical Provider, MD  metoprolol succinate (TOPROL-XL) 25 MG 24 hr tablet Take 25 mg by mouth daily. 02/20/11  Yes Cassell Clement, MD  polyethylene glycol (MIRALAX / GLYCOLAX) packet Take 17 g by mouth daily as needed (for constipation).    Yes Historical Provider, MD  simvastatin (ZOCOR) 10 MG tablet Take 10 mg by mouth at bedtime.   Yes Historical Provider, MD  tiotropium (SPIRIVA) 18 MCG inhalation capsule Place 18 mcg into inhaler and inhale daily as needed (for shortness of breath).   Yes Historical Provider, MD  warfarin (COUMADIN) 5 MG tablet Take 5-7.5 mg by mouth daily. Takes  5mg  daily except 7.5mg  on saturdays   Yes Historical  Provider, MD  zolpidem (AMBIEN) 10 MG tablet Take 10 mg by mouth at bedtime.  05/02/11  Yes Cassell Clement, MD  nitroGLYCERIN (NITROSTAT) 0.4 MG SL tablet Place 1 tablet (0.4 mg total) under the tongue every 5 (five) minutes as needed for chest pain. 05/06/12 05/06/13  Cassell Clement, MD     All other systems have been reviewed and were otherwise negative with the exception of those mentioned in the HPI and as above.  Physical Exam: Filed Vitals:   11/06/12 0612  BP: 142/78  Pulse: 60  Temp: 97.8 F (36.6 C)  Resp: 18    General: Alert, no acute distress Cardiovascular: No pedal edema Respiratory: No cyanosis, no use of accessory musculature Skin: No lesions in the area of chief complaint Neurologic: Sensation intact distally Psychiatric: Patient is competent for consent with normal mood and affect Lymphatic: No axillary or cervical lymphadenopathy   Assessment/Plan: Bilateral buttock and leg pain Plan for Procedure(s): LUMBAR DECOMPRESSION L2-L5, POSTERIOR LUMBAR FUSION L4/5   Emilee Hero, MD 11/06/2012 7:24 AM

## 2012-11-06 NOTE — OR Nursing (Signed)
Family updated per Dr. Yevette Edwards via [email or web address]

## 2012-11-06 NOTE — Transfer of Care (Signed)
Immediate Anesthesia Transfer of Care Note  Patient: Peter Richards  Procedure(s) Performed: Procedure(s) with comments: POSTERIOR LUMBAR FUSION 1 LEVEL (N/A) - Lumbar 2-5 decompression, lumbar 4-5 posterior spinal fusion with instrumentation, possible bone marrow aspirate, autograft, possible vitoss. LUMBAR LAMINECTOMY/DECOMPRESSION MICRODISCECTOMY (Bilateral) - Lumbar 2-5 decompression  Patient Location: PACU  Anesthesia Type:General  Level of Consciousness: awake  Airway & Oxygen Therapy: Patient Spontanous Breathing and Patient connected to nasal cannula oxygen  Post-op Assessment: Report given to PACU RN and Post -op Vital signs reviewed and stable  Post vital signs: Reviewed and stable  Complications: No apparent anesthesia complications

## 2012-11-07 ENCOUNTER — Encounter (HOSPITAL_COMMUNITY): Payer: Self-pay | Admitting: Orthopedic Surgery

## 2012-11-07 LAB — BASIC METABOLIC PANEL
CO2: 23 mEq/L (ref 19–32)
Calcium: 8.6 mg/dL (ref 8.4–10.5)
GFR calc non Af Amer: 59 mL/min — ABNORMAL LOW (ref >90)
Glucose, Bld: 115 mg/dL — ABNORMAL HIGH (ref 70–99)
Potassium: 4.8 mEq/L (ref 3.5–5.1)
Sodium: 140 mEq/L (ref 135–145)

## 2012-11-07 LAB — CBC
Hemoglobin: 10.1 g/dL — ABNORMAL LOW (ref 13.0–17.0)
MCH: 30.7 pg (ref 26.0–34.0)
Platelets: 192 10*3/uL (ref 150–400)
RBC: 3.29 MIL/uL — ABNORMAL LOW (ref 4.22–5.81)

## 2012-11-07 NOTE — Progress Notes (Signed)
Utilization review complete. Margaret Cockerill RN CCM Case Mgmt phone [phone] 

## 2012-11-07 NOTE — Progress Notes (Signed)
PT PROGRESS NOTE  11/07/12 1445  PT Visit Information  Last PT Received On 11/07/12  Assistance Needed +2  PT Time Calculation  PT Start Time 1419  PT Stop Time 1443  PT Time Calculation (min) 24 min  Subjective Data  Subjective "how am I going to roll tonight?"  Precautions  Precautions Back  Precaution Comments reinforced back precautions  and log rolling with functional mobility during session  Required Braces or Orthoses Spinal Brace  Spinal Brace TLSO;Other (comment) (with leg attachment)  Restrictions  Weight Bearing Restrictions No  Cognition  Arousal/Alertness Awake/alert  Behavior During Therapy WFL for tasks assessed/performed  Overall Cognitive Status Impaired/Different from baseline  Area of Impairment Attention;Following commands  Current Attention Level Focused  Following Commands Follows one step commands consistently  General Comments pt. more alert but still somewhat "sluggish " in responses  Bed Mobility  Bed Mobility Rolling Left;Left Sidelying to Sit;Sitting - Scoot to [place] of Bed;Sit to Supine  Rolling Left 3: Mod assist;With rail  Left Sidelying to Sit 1: +2 Total assist;HOB flat  Left Sidelying to Sit: Patient Percentage 30%  Sitting - Scoot to Edge of Bed 1: +2 Total assist  Sitting - Scoot to Edge of Bed: Patient Percentage 20%  Sit to Supine 1: +2 Total assist  Sit to Supine: Patient Percentage 20%  Details for Bed Mobility Assistance able to assist self slightly more than this am  Transfers  Transfers Not assessed  Balance  Balance Assessed Yes  Static Sitting Balance  Static Sitting - Balance Support Bilateral upper extremity supported;Feet supported  Static Sitting - Level of Assistance 3: Mod assist;Other (comment) (pat. elans forward and toward right , mod assist needed)  Static Sitting - Comment/# of Minutes 5  PT - Assessment/Plan  Comments on Treatment Session Pt. with difficulty maintaining his sitting balance even though he is more  awake this pm.  He was not ready to attempt standing or transferring to chair even with his increased alertness.  I anticipate slow progression with mobility.  PT Plan Discharge plan remains appropriate;Frequency remains appropriate  PT Frequency Min 6X/week  Follow Up Recommendations SNF;Supervision/Assistance - 24 hour;Supervision for mobility/OOB  PT equipment Rolling walker with 5" wheels  Acute Rehab PT Goals  Pt will go Supine/Side to Sit with supervision  PT Goal: Supine/Side to Sit - Progress Progressing toward goal  Pt will go Sit to Supine/Side with supervision  PT Goal: Sit to Supine/Side - Progress Progressing toward goal  Additional Goals  Additional Goal #1 pt. will state and comply with 3/3 back precautions and log rolling technique  PT Goal: Additional Goal #1 - Progress Progressing toward goal  PT General Charges  $$ ACUTE PT VISIT 1 Procedure  PT Treatments  $Therapeutic Activity 23-37 mins  Weldon Picking PT Acute Rehab Services [phone] Beeper [phone]

## 2012-11-07 NOTE — Evaluation (Signed)
Physical Therapy Evaluation Patient Details Name: Peter Richards MRN: [id number] DOB: 10-Sep-1928 Today's Date: 11/07/2012 Time: 0865-7846 PT Time Calculation (min): 36 min  PT Assessment / Plan / Recommendation Clinical Impression  Pt. is s/p lumbar decompression and fusion and presents to PT this am in significantly sleepy state which inhibited his full participation in his therapy session.  Currently he has limitations in functional mobility and gait and needs acute PT to address these and below issues.  Only able to tolerate sitting at edge of bed this am and was not able to stand or transfer yet.  Believe he will need post acute rehab (lives at [facility] and can go to rehab there).    PT Assessment  Patient needs continued PT services    Follow Up Recommendations  SNF;Supervision/Assistance - 24 hour;Supervision for mobility/OOB    Does the patient have the potential to tolerate intense rehabilitation      Barriers to Discharge None      Equipment Recommendations  Rolling walker with 5" wheels    Recommendations for Other Services OT consult   Frequency Min 6X/week    Precautions / Restrictions Precautions Precautions: Back Precaution Booklet Issued: Yes (comment) Precaution Comments: provided back precaution sheet and instructed pt. and wife in back precautions and log rolling Required Braces or Orthoses: Spinal Brace Spinal Brace: Thoracolumbosacral orthotic;Other (comment) (with leg attachment) Restrictions Weight Bearing Restrictions: No   Pertinent Vitals/Pain sats 95% on 2 L O2,; See vitals tab       Mobility  Bed Mobility Bed Mobility: Rolling Left;Left Sidelying to Sit;Sitting - Scoot to [place] of Bed;Sit to Supine Rolling Left: 3: Mod assist;With rail Left Sidelying to Sit: 1: +2 Total assist;HOB flat Left Sidelying to Sit: Patient Percentage: 20% Sitting - Scoot to Edge of Bed: 1: +2 Total assist Sitting - Scoot to Edge of Bed: Patient Percentage:  10% Sit to Supine: 1: +2 Total assist Sit to Supine: Patient Percentage: 0% Details for Bed Mobility Assistance: pt. quite sleepy and unable to assis much in transitional movements. Transfers Transfers: Not assessed Ambulation/Gait Ambulation/Gait Assistance: Not tested (comment)    Exercises     PT Diagnosis: Difficulty walking;Acute pain;Abnormality of gait  PT Problem List: Decreased activity tolerance;Decreased balance;Decreased mobility;Decreased knowledge of use of DME;Decreased safety awareness;Decreased knowledge of precautions;Pain;Decreased cognition PT Treatment Interventions: DME instruction;Gait training;Functional mobility training;Therapeutic activities;Balance training;Patient/family education   PT Goals Acute Rehab PT Goals PT Goal Formulation: With patient/family Time For Goal Achievement: 11/14/12 Potential to Achieve Goals: Fair Pt will go Supine/Side to Sit: with supervision PT Goal: Supine/Side to Sit - Progress: Goal set today Pt will go Sit to Supine/Side: with supervision PT Goal: Sit to Supine/Side - Progress: Goal set today Pt will go Sit to Stand: with supervision PT Goal: Sit to Stand - Progress: Goal set today Pt will go Stand to Sit: with supervision PT Goal: Stand to Sit - Progress: Goal set today Pt will Transfer Bed to Chair/Chair to Bed: with supervision PT Transfer Goal: Bed to Chair/Chair to Bed - Progress: Goal set today Pt will Ambulate: 51 - 150 feet;with supervision;with rolling walker PT Goal: Ambulate - Progress: Goal set today Additional Goals Additional Goal #1: pt. will state and comply with 3/3 back precautions and log rolling technique PT Goal: Additional Goal #1 - Progress: Goal set today  Visit Information  Last PT Received On: 11/07/12 Assistance Needed: +2    Subjective Data  Subjective: "Am I hurting!!" Patient Stated Goal: to rehab  then home at [facility] (per wife)   Prior Functioning  Home Living Lives With:  Spouse Available Help at Discharge: Skilled Nursing Facility;Other (Comment) (lives at [facility]) Type of Home: House Home Access: Level entry Home Layout: One level Home Adaptive Equipment: Straight cane Prior Function Level of Independence: Independent with assistive device(s) Driving: Yes Vocation: Retired Musician: No difficulties    Cognition  Cognition Arousal/Alertness: Lethargic Behavior During Therapy:  (limited by sleepiness) Overall Cognitive Status: Impaired/Different from baseline Area of Impairment: Attention;Following commands Current Attention Level: Focused Following Commands: Follows one step commands inconsistently General Comments: pt. very sleepy and has had percocet and valium per RN.      Extremity/Trunk Assessment Right Upper Extremity Assessment RUE ROM/Strength/Tone: [facility] for tasks assessed Left Upper Extremity Assessment LUE ROM/Strength/Tone: WFL for tasks assessed Right Lower Extremity Assessment RLE ROM/Strength/Tone: WFL for tasks assessed Left Lower Extremity Assessment LLE ROM/Strength/Tone: WFL for tasks assessed Trunk Assessment Trunk Assessment: Kyphotic   Balance Balance Balance Assessed: Yes Static Sitting Balance Static Sitting - Balance Support: Bilateral upper extremity supported;Feet supported Static Sitting - Level of Assistance: 3: Mod assist Static Sitting - Comment/# of Minutes: 4  End of Session PT - End of Session Activity Tolerance: Treatment limited secondary to medication Patient left: in bed;with call bell/phone within reach;with family/visitor present Nurse Communication: Mobility status;Precautions;Other (comment) (pt.'s level of sleepiness discussed with Pershing Proud RN)  GP     Ferman Hamming 11/07/2012, 1:31 PM Weldon Picking PT Acute Rehab Services [phone] Beeper [phone]

## 2012-11-07 NOTE — Op Note (Signed)
NAME:  Peter Richards, Peter Richards NO.:  [id number]  MEDICAL RECORD NO.:  [id number]  LOCATION:  [id number]                        FACILITY:  [facility]  PHYSICIAN:  Estill Bamberg, MD      DATE OF BIRTH:  03-20-1929  DATE OF PROCEDURE:  11/06/2012                              OPERATIVE REPORT   PREOPERATIVE DIAGNOSIS: 1. L2-L5 spinal stenosis. 2. Grade 1 L4-5 spondylolisthesis. 3. Neurogenic claudication.  POSTOPERATIVE DIAGNOSIS: 1. L2-L5 spinal stenosis. 2. Grade 1 L4-5 spondylolisthesis. 3. Neurogenic claudication.  PROCEDURES: 1. L2-3, L3-4, L4-5 laminectomy, bilateral partial facetectomy,     bilateral foraminotomy. 2. Posterior spinal fusion, L4-5. 3. Placement of posterior instrumentation, L4-5. 4. Use of local autograft. 5. Intraoperative use of fluoroscopy.  SURGEON:  Estill Bamberg, MD  ASSISTANT:  Jason Coop, PA-C  ANESTHESIA:  General endotracheal anesthesia.  COMPLICATIONS:  None.  DISPOSITION:  Stable.  ESTIMATED BLOOD LOSS:  Minimal.  INDICATIONS FOR PROCEDURE:  Briefly, Mr. Moncrief is an extremely pleasant 79 year old male, who did present to me on March 18, 2012, with a longstanding history of pain in his bilateral buttocks and posterior thighs.  Of note, the patient did have extensive forms of conservative care, including multiple epidural injections as well as physical therapy.  He also took various medications including Neurontin and hydrocodone.  However, he continued to have pain in his bilateral legs consistent with neurogenic claudication.  I did review a CT myelogram, which was clearly notable for spinal stenosis involving the L2-L3, L3-L4, and L4-5 levels.  There was also noted to be a spondylolisthesis at the L4-5 level.  Given his failure of multiple nonoperative measures, and ongoing pain, we did have a discussion regarding going forward with an L2-L5 decompression and instrumented posterolateral fusion across the L4-5 level.   The patient fully understood the risks and limitations of the procedure as outlined in my preoperative note.  OPERATIVE DETAILS:  On November 06, 2012, the patient was brought to surgery and general endotracheal anesthesia was administered.  The patient was placed prone on a well-padded flat Jackson bed with a Wilson frame. Antibiotics were given and a time-out procedure was performed.  The back was then prepped and draped in usual sterile fashion.  I then made an incision from approximately spinous process of L2 to approximately the spinous process of L5.  The fascia was sharply incised at the midline. The paraspinal musculature was bluntly swept laterally.  I then identified the lamina of L4-L5, as well as the transverse processes of L4 and L5, and these were subperiosteally exposed.  The correct level was confirmed using a lateral intraoperative radiograph.  I then subperiosteally exposed the lamina of L2, L3, L4, and L5.  Using anatomic landmarks, I did use a 4-mm high-speed burr to gain access to the L4 and L5 pedicles first on the right and then on the left side.  I then used a gearshift probe followed by a 5 mm tap to prepare the pedicles in anticipation of the pedicle screws to be placed later.  Of particular note, it was obvious that the patient's bone was very osteoporotic and this was readily obvious while preparing the pedicles. I then placed bone wax in the  pedicle holes and packed the posterolateral gutters with Ray-Tec.  I then went forward with the decompression.  I did remove the spinous processes of L4-L3 and L2.  I then performed a central decompression starting at the L4-5 level and working up to the L2-3 level.  I then performed a thorough and complete lateral recess decompression, followed by a neural foraminal decompression, first on the right and then on the left.  A thorough and adequate decompression was confirmed by my ability to be able to pass a [place] out the  neural foramina on both the right and the left sides.  I was able to palpate the pedicles as well.  I was very pleased with the final decompression.  I then placed a [place] in the lateral gutters, to control epidural bleeding.  At this point, I did use a high-speed burr to decorticate the transverse processes of L4 and L5 as well as the lamina of L4-L5, including the L4-5 facet joint.  The autograft that was obtained from the decompression was utilized and packed into the posterolateral gutters on both the right and the left sides.  I then proceeded with placing a 7 x 40 mm screws on the right and on the left sides.  I did use neurologic monitoring to test every of the screws and there was no screw that tested below 12 milliamps.  I then secured a 40 mm rod on the left side and a 35 mm rod on the right.  Caps were placed followed by a final locking procedure.  I was very pleased with the final appearance.  Of note, I did use intraoperative fluoroscopy while placing the screws.  At this point, the epidural space was explored, and there was no undue bleeding.  I then inserted a number #15 deep Blake drain.  Also of note, the retractors were relaxed approximately every 30 minutes to optimize perfusion of the soft tissues.  Also of note, the wound was copiously irrigated intermittently throughout the procedure. At this point, I did close the fascia using #1 Vicryl.  The subcutaneous layer was closed using 2-0 Vicryl and the skin was closed using 3-0 Monocryl.  Benzoin and Steri-Strips were then applied followed by sterile dressing.  All instrument counts were correct at the termination of the procedure.  There was no abnormal EMG activity noted throughout. There was no abnormal sustained EMG activity noted throughout the entirety of the procedure.  Of note, Jason Coop was my assistant throughout the entirety throughout the entirety of the procedure and aided in essential retraction and  suctioning needed throughout the surgery.       Estill Bamberg, MD   Cc: Dr. Merlene Laughter  MD/MEDQ  D:  11/06/2012  T:  11/07/2012  Job:  [id number]

## 2012-11-07 NOTE — Evaluation (Signed)
Occupational Therapy Evaluation Patient Details Name: Peter Richards MRN: [id number] DOB: Sep 04, 1928 Today's Date: 11/07/2012 Time: 7829-5621 OT Time Calculation (min): 40 min  OT Assessment / Plan / Recommendation Clinical Impression  Pt is an 79 yr old male admitted for a L2 -L5 decompression and L4-L5 fusion.  Currently still groggy from medications and needs total assist +2 (pt 30%) functional transfers and selfcare tasks.  Will benefit from acute care OT to help increase overall independence and SNF for follow-up secondary to his wife not being able to provice assist at the current level.      OT Assessment  Patient needs continued OT Services    Follow Up Recommendations  SNF    Barriers to Discharge None    Equipment Recommendations  3 in 1 bedside comode       Frequency  Min 2X/week    Precautions / Restrictions Precautions Precautions: Back Precaution Booklet Issued: Yes (comment) Precaution Comments: reinforced back precautions  and log rolling with functional mobility during session Required Braces or Orthoses: Spinal Brace Spinal Brace: Thoracolumbosacral orthotic;Other (comment);Applied in standing position Restrictions Weight Bearing Restrictions: No   Pertinent Vitals/Pain Pain 9/10 with transition to sitting EOB, decreased with application of brace and transfer to supporting chair    ADL  Eating/Feeding: Performed;Set up Where Assessed - Eating/Feeding: Chair Grooming: Simulated;Set up Where Assessed - Grooming: Unsupported sitting Upper Body Bathing: Simulated;Minimal assistance Where Assessed - Upper Body Bathing: Unsupported sitting Lower Body Bathing: Simulated;+2 Total assistance Lower Body Bathing: Patient Percentage: 40% Where Assessed - Lower Body Bathing: Supported sit to stand Upper Body Dressing: Performed;+1 Total assistance (including TLSO) Where Assessed - Upper Body Dressing: Unsupported sitting Lower Body Dressing: Simulated;+2 Total  assistance Lower Body Dressing: Patient Percentage: 40% Where Assessed - Lower Body Dressing: Supported sit to stand Toilet Transfer: Simulated;+2 Total assistance Toilet Transfer: Patient Percentage: 40% Statistician Method: Surveyor, minerals: Other (comment) (to bedside chair, pt with cathetor) Toileting - Architect and Hygiene: Simulated;+2 Total assistance Toileting - Architect and Hygiene: Patient Percentage: 40% Where Assessed - Toileting Clothing Manipulation and Hygiene: Sit to stand from 3-in-1 or toilet Tub/Shower Transfer Method: Not assessed Equipment Used: Rolling walker;Gait belt Transfers/Ambulation Related to ADLs: Pt total assist +2 for stand pivot transfer bed to bedside chair. ADL Comments: Pt with decreased ability to maintain upright posture in sitting or standing.  Tends to sit with cervical flexion and slight thoracic flexion.  In standing demonstrates bilateral knee flexion as well.  Decreased ability to perform adequate step length with stand pivot transfer as well.  Pt not aware of any back precautions at beginning of session.      OT Diagnosis: Generalized weakness;Acute pain  OT Problem List: Decreased strength;Decreased activity tolerance;Impaired balance (sitting and/or standing);Decreased knowledge of use of DME or AE;Decreased knowledge of precautions;Pain OT Treatment Interventions: Self-care/ADL training;Therapeutic activities;Patient/family education;DME and/or AE instruction;Balance training   OT Goals Acute Rehab OT Goals OT Goal Formulation: With patient Time For Goal Achievement: 11/14/12 Potential to Achieve Goals: Good ADL Goals Pt Will Perform Grooming: with min assist;Standing at sink;Supported ADL Goal: Grooming - Progress: Goal set today Pt Will Perform Lower Body Bathing: with min assist;Sit to stand from bed;with adaptive equipment;Supported ADL Goal: Lower Body Bathing - Progress: Goal set  today Pt Will Perform Lower Body Dressing: with min assist;Sit to stand from bed;Supported;with adaptive equipment ADL Goal: Lower Body Dressing - Progress: Goal set today Pt Will Transfer to Toilet: with min assist;with  DME;Ambulation;3-in-1;Maintaining back safety precautions ADL Goal: Toilet Transfer - Progress: Goal set today Pt Will Perform Toileting - Clothing Manipulation: with min assist;Sitting on 3-in-1 or toilet;Standing ADL Goal: Toileting - Clothing Manipulation - Progress: Goal set today Pt Will Perform Toileting - Hygiene: with min assist;Sit to stand from 3-in-1/toilet ADL Goal: Toileting - Hygiene - Progress: Goal set today Miscellaneous OT Goals Miscellaneous OT Goal #1: Pt will state and follow 3/3 back precautions with min questioning cues during selfcare tasks. OT Goal: Miscellaneous Goal #1 - Progress: Goal set today  Visit Information  Last OT Received On: 11/07/12 Assistance Needed: +2    Subjective Data  Subjective: If you drop me I'm going to sue you. Patient Stated Goal: Pt did not state this session.   Prior Functioning     Home Living Lives With: Spouse Available Help at Discharge: Skilled Nursing Facility;Other (Comment) (lives at [facility]) Type of Home: House Home Access: Level entry Home Layout: One level Home Adaptive Equipment: Straight cane Prior Function Level of Independence: Independent with assistive device(s) Driving: Yes Vocation: Retired Musician: No difficulties Dominant Hand: Right         Vision/Perception Vision - History Baseline Vision: No visual deficits Patient Visual Report: No change from baseline Vision - Assessment Eye Alignment: Within Functional Limits Vision Assessment: Vision not tested Perception Perception: Within Functional Limits Praxis Praxis: Intact   Cognition  Cognition Arousal/Alertness: Suspect due to medications Behavior During Therapy: WFL for tasks  assessed/performed Overall Cognitive Status: Impaired/Different from baseline Area of Impairment: Attention;Following commands;Awareness Current Attention Level: Focused Following Commands: Follows one step commands with increased time Awareness: Intellectual General Comments: Pt still somewhat slow to process commands and maintain sustained attention but much improved from previous PT sessions.    Extremity/Trunk Assessment Right Upper Extremity Assessment RUE ROM/Strength/Tone: WFL for tasks assessed RUE Sensation: WFL - Light Touch RUE Coordination: WFL - gross/fine motor Left Upper Extremity Assessment LUE ROM/Strength/Tone: WFL for tasks assessed LUE Sensation: WFL - Light Touch LUE Coordination: WFL - gross/fine motor Right Lower Extremity Assessment RLE ROM/Strength/Tone: WFL for tasks assessed Left Lower Extremity Assessment LLE ROM/Strength/Tone: WFL for tasks assessed Trunk Assessment Trunk Assessment: Kyphotic;Other exceptions Trunk Exceptions: cervical flexion and protraction present     Mobility Bed Mobility Bed Mobility: Not assessed Rolling Left: With rail Left Sidelying to Sit: 2: Max assist;With rails;HOB elevated Left Sidelying to Sit: Patient Percentage: 30% Sitting - Scoot to Edge of Bed: 2: Max assist Sitting - Scoot to [place] of Bed: Patient Percentage: 20% Sit to Supine: 1: +2 Total assist Sit to Supine: Patient Percentage: 20% Details for Bed Mobility Assistance: Pt needs max instructional cueing to initiate and sequence bed mobility following back precautions. Transfers Transfers: Sit to Stand Sit to Stand: 1: +1 Total assist;With upper extremity assist;From bed Sit to Stand: Patient Percentage: 40% Stand to Sit: To chair/3-in-1;Without upper extremity assist Stand to Sit: Patient Percentage: 40% Details for Transfer Assistance: Pt. sitting on edge of bed upon Pt entry into room to assist OT.  Pt, sat for about 6 minutes for paartial brace application.   He needed min assist for upright posture and frequent reminders to sit upright.  Pt. stood for completion of brace application (leg attachment).          Balance Balance Balance Assessed: Yes Static Sitting Balance Static Sitting - Balance Support: Right upper extremity supported;Left upper extremity supported Static Sitting - Level of Assistance: 4: Min assist Static Sitting - Comment/# of Minutes:  6-7 Static Standing Balance Static Standing - Balance Support: Right upper extremity supported;Left upper extremity supported Static Standing - Level of Assistance: 1: +2 Total assist;Other (comment);Patient percentage (comment) (40%)   End of Session OT - End of Session Equipment Utilized During Treatment: Gait belt;Back brace Activity Tolerance: Patient limited by pain;Patient limited by fatigue Patient left: in chair;with call bell/phone within reach;with family/visitor present Nurse Communication: Mobility status;Other (comment) (doffing TLSO with hip attachment)      Karrington Mccravy OTR/L Pager number [phone] 11/07/2012, 4:37 PM

## 2012-11-07 NOTE — Progress Notes (Signed)
PT PROGRESS NOTE  11/07/12 1533  PT Visit Information  Last PT Received On 11/07/12  Assistance Needed +2  PT Time Calculation  PT Start Time 1512  PT Stop Time 1525  PT Time Calculation (min) 13 min  Subjective Data  Subjective This PT approached by OT stating that pt. is requesting to get into chair.  Precautions  Precautions Back  Precaution Comments reinforced back precautions  and log rolling with functional mobility during session  Required Braces or Orthoses Spinal Brace  Spinal Brace TLSO;Other (comment) (leg attachment)  Cognition  Arousal/Alertness Awake/alert  Behavior During Therapy WFL for tasks assessed/performed  Overall Cognitive Status Within Functional Limits for tasks assessed  General Comments continues to become more appropriate  Bed Mobility  Bed Mobility Not assessed  Transfers  Transfers Sit to Stand;Stand to Sit  Sit to Stand 1: +2 Total assist;From bed  Sit to Stand: Patient Percentage 30%  Stand to Sit 1: +2 Total assist;To chair/3-in-1  Stand to Sit: Patient Percentage 30%  Details for Transfer Assistance Pt. sitting on edge of bed upon Pt entry into room to assist OT.  Pt, sat for about 6 minutes for paartial brace application.  He needed min assist for upright posture and frequent reminders to sit upright.  Pt. stood for completion of brace application (leg attachment).    Ambulation/Gait  Ambulation/Gait Assistance 1: +2 Total assist  Ambulation/Gait: Patient Percentage 50%  Ambulation Distance (Feet) 2 Feet  Assistive device Rolling walker  Ambulation/Gait Assistance Details Pt. needed  +2 assist for 2 turning steps to chair.  Assist needed to maintain upright standing and many vc's  Gait Pattern Step-to pattern  Static Sitting Balance  Static Sitting - Balance Support Bilateral upper extremity supported;Feet supported  Static Sitting - Level of Assistance 4: Min assist  Static Sitting - Comment/# of Minutes 6-7  PT - End of Session   Equipment Utilized During Treatment Gait belt;Back brace  Activity Tolerance Patient limited by fatigue  Patient left in chair;with call bell/phone within reach;with family/visitor present  Nurse Communication Mobility status;Other (comment) (need for +2 assist)  PT - Assessment/Plan  Comments on Treatment Session continues to gradually progress today and was finally able to get to chair.    PT Plan Discharge plan remains appropriate;Frequency remains appropriate  PT Frequency Min 6X/week  Recommendations for Other Services OT consult  Follow Up Recommendations SNF;Supervision/Assistance - 24 hour;Supervision for mobility/OOB  PT equipment Rolling walker with 5" wheels  Acute Rehab PT Goals  PT Goal Formulation With patient/family  Pt will go Sit to Stand with supervision  PT Goal: Sit to Stand - Progress Progressing toward goal  Pt will go Stand to Sit with supervision  PT Goal: Stand to Sit - Progress Progressing toward goal  Pt will Transfer Bed to Chair/Chair to Bed with supervision  PT Transfer Goal: Bed to Chair/Chair to Bed - Progress Progressing toward goal  Pt will Ambulate 51 - 150 feet;with supervision;with rolling walker  PT Goal: Ambulate - Progress Progressing toward goal  PT General Charges  $$ ACUTE PT VISIT 1 Procedure  PT Treatments  $Gait Training 8-22 mins  Weldon Picking PT Acute Rehab Services [phone] Bee[place] [phone]

## 2012-11-07 NOTE — Progress Notes (Signed)
PO Day 1 S/P L2-L5 decompression and L4-5 Fusion for spinal stenosis and neurogenic claudication. Doing well, pt with moderate sedation secondary to medications. Reports feeling much better today than prior to surgery, denies leg pain, expected post-op LBP. No other complaints, denies SOB/N/V/lightheadedness.   BP 119/54  Pulse 81  Temp(Src) 97.3 F (36.3 C) (Oral)  Resp 18  SpO2 98% HGB 10.1 down from 13.6 10/29/2012, GFR 59 down from 67 on 10/29/2012 in pt with ostomy bag S/P bladder resection  Pt laying comfortably in hospital bed, A&OX3, moderately sedated but answers all questions appropriately, bandage CDI, Drain in place 100 over last 12hrs, 294 since sx. 2+ DPP, -Homans, SCDS in place, neurovascularly intact.  PO Day 1 S/P L2-L5 decompression and L4-5 Fusion doing well  -D/C PCA, transition to oral meds (Percocet & Valium)   -Up with PT/OT today   -TLSO brace when OOB   -Back Precautions  -Social Work to discuss placement, pt has already begun arrangements   -Monitor sedation and if continues may consider downgrading medications further  -Maintain drain, pull when <60 over 12hrs, likely tomorrow

## 2012-11-08 MED ORDER — WARFARIN SODIUM 7.5 MG PO TABS
7.5000 mg | ORAL_TABLET | Freq: Once | ORAL | Status: AC
  Administered 2012-11-08: 7.5 mg via ORAL
  Filled 2012-11-08: qty 1

## 2012-11-08 MED ORDER — WARFARIN - PHARMACIST DOSING INPATIENT: Freq: Every day | Status: DC

## 2012-11-08 NOTE — Progress Notes (Signed)
ANTICOAGULATION CONSULT NOTE - Initial Consult  Pharmacy Consult for Coumadin Indication: h/o  atrial fibrillation  Allergies  Allergen Reactions  . Codeine Nausea And Vomiting  . Crestor (Rosuvastatin Calcium) Other (See Comments)    Leg cramps    Patient Measurements: Height: 5\' 8"  (172.7 cm) (from 10/29/12) Weight: 183 lb 9.6 oz (83.28 kg) (from 10/29/12) IBW/kg (Calculated) : 68.4   Vital Signs: Temp: 97.8 F (36.6 C) (06/07 0539) BP: 109/61 mmHg (06/07 0539) Pulse Rate: 76 (06/07 0539)  Labs:  Recent Labs  11/06/12 0624 11/07/12 0520  HGB  --  10.1*  HCT  --  30.5*  PLT  --  192  LABPROT 14.3  --   INR 1.13  --   CREATININE  --  1.13    Estimated Creatinine Clearance: 53 ml/min (by C-G formula based on Cr of 1.13).   Medical History: Past Medical History  Diagnosis Date  . Hyperlipidemia   . Orthostatic hypotension   . Bradycardia   . Syncope     s/p PTVP  . Myocardial infarction     1981  . CAD (coronary artery disease)     prior CABG in 2002 negative Myoview in 2009  . Hypertension   . Stroke     TIA  2007    . GERD (gastroesophageal reflux disease)   . Headache(784.0)   . Arthritis     OSTEOPOROSIS  . Aortic stenosis, mild     by 01/08/08 echo  . Chronic anticoagulation   . Depression   . AAA (abdominal aortic aneurysm)   . Heart attack   . Pacemaker     medtronic pacer  . History of cervical spinal surgery     over [place]  [name] HOH (hard of hearing)     BILATERAL HEARING AIDS  . Prostate cancer   . Basal cell adenocarcinoma     'bottom lip" (11/06/2012)  . Bladder cancer   . Attention to urostomy     "since 2006" (11/06/2012)  . Atrial fibrillation     on amiodarone and has PTVP in place  . Exertional shortness of breath   . Chronic radicular pain of lower back   . Anxiety     Medications:  Prescriptions prior to admission  Medication Sig Dispense Refill  . escitalopram (LEXAPRO) 20 MG tablet Take 20 mg by mouth daily.        [name] gabapentin (NEURONTIN) 100 MG capsule Take 100 mg by mouth at bedtime.      . lidocaine (LIDODERM) 5 % Place 1 patch onto the skin daily as needed (for pain). Remove & Discard patch within 12 hours or as directed by MD      . metoprolol succinate (TOPROL-XL) 25 MG 24 hr tablet Take 25 mg by mouth daily.      . polyethylene glycol (MIRALAX / GLYCOLAX) packet Take 17 g by mouth daily as needed (for constipation).       . simvastatin (ZOCOR) 10 MG tablet Take 10 mg by mouth at bedtime.      [name] tiotropium (SPIRIVA) 18 MCG inhalation capsule Place 18 mcg into inhaler and inhale daily as needed (for shortness of breath).      . warfarin (COUMADIN) 5 MG tablet Take 5-7.5 mg by mouth daily. Takes  5mg  daily except 7.5mg  on saturdays      . zolpidem (AMBIEN) 10 MG tablet Take 10 mg by mouth at bedtime.       . [DISCONTINUED] HYDROcodone-acetaminophen (  NORCO) 5-325 MG per tablet Take 1 tablet by mouth every 4 (four) hours as needed for pain. For pain      . nitroGLYCERIN (NITROSTAT) 0.4 MG SL tablet Place 1 tablet (0.4 mg total) under the tongue every 5 (five) minutes as needed for chest pain.  25 tablet  prn   Scheduled:  . docusate sodium  100 mg Oral BID  . escitalopram  20 mg Oral Daily  . gabapentin  100 mg Oral QHS  . metoprolol succinate  25 mg Oral Daily  . simvastatin  10 mg Oral QHS  . sodium chloride  3 mL Intravenous Q12H  . zolpidem  5 mg Oral QHS    Assessment: 79 y.o male with h/o Afib, on chronic coumadin PTA.  Dose PTA was 5 mg daily except 7.5mg  qSat  (last dose taken 10/31/12).  Today is POD #2 after lumbar decompression and L4/5 fusion for spinal stenosis and neurogenic claudication.  Pharmacy asked to restart coumadin today.  The INR was 1.13 on 11/06/12.  Today Hgb is 10.1 and PLTC is 192, post op decrease from preop Hgb 13.6 and pltc 232K. No bleeding reported.     Goal of Therapy:  INR 2-3 Monitor platelets by anticoagulation protocol: Yes   Plan:  Coumadin 7.5 mg po  today x 1 PT/INR daily   Noah Delaine, RPh Clinical Pharmacist Pager: [phone] 11/08/2012,10:49 AM

## 2012-11-08 NOTE — Progress Notes (Signed)
Patient doing well, minimal leg pain. + LBP.  BP 109/61  Pulse 76  Temp(Src) 97.8 F (36.6 C) (Oral)  Resp 18  SpO2 94%  NVI Dressing CDI  Drain output 100/10 hours  POD #2 after lumbar decompression and L4/5 fusion  - continue PT/OT today - continue current pain regimen - will restart coumadin today - will follow-up drain output later today and d/c if output decreases - may be able to transfer to rehab facility later today if drain output decreases - TLSO brace has a thigh extension, which was not intended and not needed. Will call [employer] to d/c extension today  Order to social work has been placed to coordinate transfer, but I see no documentation of any interaction. Will look into this today.

## 2012-11-08 NOTE — Progress Notes (Signed)
Physical Therapy Treatment Patient Details Name: Peter Richards MRN: [id number] DOB: June 26, 1928 Today's Date: 11/08/2012 Time: 4098-1191 PT Time Calculation (min): 24 min  PT Assessment / Plan / Recommendation Comments on Treatment Session  continues to gradually progress and was able to ambulate farther today.    Follow Up Recommendations  SNF;Supervision/Assistance - 24 hour;Supervision for mobility/OOB           Equipment Recommendations  Rolling walker with 5" wheels       Frequency Min 6X/week   Plan Discharge plan remains appropriate;Frequency remains appropriate    Precautions / Restrictions Precautions Precautions: Fall;Back Precaution Comments: reinforced back precautions  and log rolling with functional mobility during session Required Braces or Orthoses: Spinal Brace Spinal Brace: Thoracolumbosacral orthotic;Applied in sitting position Spinal Brace Comments: leg component discontinued today by MD       Mobility  Bed Mobility Rolling Left: 3: Mod assist;With rail Left Sidelying to Sit: 1: +2 Total assist;With rails;HOB flat Left Sidelying to Sit: Patient Percentage: 40% Sitting - Scoot to Edge of Bed: 2: Max assist Sitting - Scoot to [place] of Bed: Patient Percentage: 30% Transfers Sit to Stand: With upper extremity assist;From bed;1: +2 Total assist Sit to Stand: Patient Percentage: 50% Stand to Sit: 1: +2 Total assist;To chair/3-in-1;With armrests;With upper extremity assist Stand to Sit: Patient Percentage: 40% Details for Transfer Assistance: Pt required mod to max assist with constant cues to sit upright on edge of bed to don brace. Pt demo'd significant left lateral lean and unable to correct without assistance.  Ambulation/Gait Ambulation/Gait Assistance: 1: +2 Total assist Ambulation/Gait: Patient Percentage: 60% Ambulation Distance (Feet): 8 Feet Assistive device: Rolling walker Ambulation/Gait Assistance Details: assist and cues for posture, walker  postition, walker navigation and to advance feet. Gait Pattern: Step-through pattern;Decreased stride length;Decreased step length - right;Decreased step length - left;Shuffle;Trunk flexed;Narrow base of support      PT Goals Acute Rehab PT Goals Pt will go Supine/Side to Sit: with supervision PT Goal: Supine/Side to Sit - Progress: Progressing toward goal Pt will go Sit to Stand: with supervision PT Goal: Sit to Stand - Progress: Progressing toward goal Pt will go Stand to Sit: with supervision PT Goal: Stand to Sit - Progress: Progressing toward goal Pt will Transfer Bed to Chair/Chair to Bed: with supervision PT Transfer Goal: Bed to Chair/Chair to Bed - Progress: Progressing toward goal Pt will Ambulate: 51 - 150 feet;with supervision;with rolling walker PT Goal: Ambulate - Progress: Progressing toward goal Additional Goals Additional Goal #1: pt. will state and comply with 3/3 back precautions and log rolling technique PT Goal: Additional Goal #1 - Progress: Progressing toward goal  Visit Information  Last PT Received On: 11/08/12 Assistance Needed: +2    Subjective Data  Subjective: Pt agreeable to therapy, sleepy with session.   Cognition  Cognition Behavior During Therapy: WFL for tasks assessed/performed Overall Cognitive Status: Impaired/Different from baseline Area of Impairment: Attention;Following commands;Awareness Current Attention Level: Focused Following Commands: Follows one step commands with increased time General Comments: Pt still somewhat slow to process commands and maintain sustained attention, improved today       End of Session PT - End of Session Activity Tolerance: Patient limited by fatigue Patient left: in chair;with call bell/phone within reach;with family/visitor present Nurse Communication: Mobility status   GP     Sallyanne Kuster 11/08/2012, 2:40 PM  Sallyanne Kuster, PTA Office- [phone]

## 2012-11-08 NOTE — Progress Notes (Signed)
Dr. Yevette Edwards aware of pt's JP drain output being 40mL within 6 hrs.  Dr. Yevette Edwards instructed to remove JP drain and reinforce dressing.

## 2012-11-08 NOTE — Progress Notes (Signed)
Clinical Social Work Department BRIEF PSYCHOSOCIAL ASSESSMENT 11/08/2012  Patient:  Peter Richards, Peter Richards     Account Number:  [id number]     Admit date:  11/06/2012  Clinical Social Worker:  Dennison Bulla  Date/Time:  11/08/2012 02:00 PM  Referred by:  Physician  Date Referred:  11/08/2012 Referred for  SNF Placement   Other Referral:   Interview type:  Patient Other interview type:    PSYCHOSOCIAL DATA Living Status:  FACILITY Admitted from facility:  [name] Level of care:  Assisted Living Primary support name:  Hilda Lias Primary support relationship to patient:  SPOUSE Degree of support available:   Strong    CURRENT CONCERNS Current Concerns  Post-Acute Placement   Other Concerns:    SOCIAL WORK ASSESSMENT / PLAN CSW received referral to assist with DC planning. CSW reviewed chart and met with patient and wife at bedside. CSW introduced myself and explained role.    Patient lives at [facility] in ALF with wife. Patient already spoke to facility regarding SNF placement at DC. CSW spoke with [facility] who reports no beds available until Monday. CSW shared information with RN who said she would inform MD. CSW completed FL2 and placed in chart for MD signature.    CSW will continue to follow and will assist with DC plans.   Assessment/plan status:  Psychosocial Support/Ongoing Assessment of Needs Other assessment/ plan:   Information/referral to community resources:   SNF information    PATIENT'S/FAMILY'S RESPONSE TO PLAN OF CARE: Patient alert and oriented. Patient engaged and agreeable to plan. Patient prefers [facility] to transport him back to facility at DC.       (Weekend Coverage)

## 2012-11-08 NOTE — Progress Notes (Addendum)
Clinical Social Work Department CLINICAL SOCIAL WORK PLACEMENT NOTE 11/08/2012  Patient:  Peter Richards, Peter Richards  Account Number:  [id number] Admit date:  11/06/2012  Clinical Social Worker:  Unk Lightning, LCSW  Date/time:  11/08/2012 02:00 PM  Clinical Social Work is seeking post-discharge placement for this patient at the following level of care:   SKILLED NURSING   (*CSW will update this form in Epic as items are completed)   11/08/2012  Patient/family provided with [facility] Department of Clinical Social Work's list of facilities offering this level of care within the geographic area requested by the patient (or if unable, by the patient's family).  11/08/2012  Patient/family informed of their freedom to choose among providers that offer the needed level of care, that participate in Medicare, Medicaid or managed care program needed by the patient, have an available bed and are willing to accept the patient.  11/08/2012  Patient/family informed of [facility]' ownership interest in [facility], as well as of the fact that they are under no obligation to receive care at this facility.  PASARR submitted to EDS on existing # PASARR number received from EDS on   FL2 transmitted to all facilities in geographic area requested by pt/family on  11/08/2012 FL2 transmitted to all facilities within larger geographic area on   Patient informed that his/her managed care company has contracts with or will negotiate with  certain facilities, including the following:     Patient/family informed of bed offers received:   Patient chooses bed at [facility] Physician recommends and patient chooses bed at    Patient to be transferred to  on  11/10/2012 Patient to be transferred to facility by [facility]  The following physician request were entered in Epic:   Additional Comments:

## 2012-11-08 NOTE — Progress Notes (Signed)
Removed JP drain without difficulty, no bleeding or drainage noted.

## 2012-11-09 LAB — PROTIME-INR
INR: 1.3 (ref 0.00–1.49)
Prothrombin Time: 15.9 seconds — ABNORMAL HIGH (ref 11.6–15.2)

## 2012-11-09 MED ORDER — WARFARIN SODIUM 7.5 MG PO TABS
7.5000 mg | ORAL_TABLET | Freq: Once | ORAL | Status: AC
  Administered 2012-11-09: 7.5 mg via ORAL
  Filled 2012-11-09: qty 1

## 2012-11-09 NOTE — Progress Notes (Signed)
Patient doing well, minimal leg pain. + LBP. Doing well with PT and OT.   BP 106/57  Pulse 46  Temp(Src) 98 F (36.7 C) (Oral)  Resp 18  Ht 5\' 8"  (1.727 m)  Wt 83.28 kg (183 lb 9.6 oz)  BMI 27.92 kg/m2  SpO2 98%  Patient looks well, appears comfortable. Sitting comfortably in chair with brace. NVI  Dressing CDI   POD #3 after lumbar decompression and L4/5 fusion  - continue PT/OT today  - continue current pain regimen  - continue coumadin  - per social worker, bed not available until tomorrow, will plan to transfer then - TLSO brace has been modified appropriately

## 2012-11-09 NOTE — Progress Notes (Signed)
Occupational Therapy Treatment Patient Details Name: Peter Richards MRN: [id number] DOB: [birth year] Today's Date: 11/09/2012 Time: 4098-1191 OT Time Calculation (min): 25 min  OT Assessment / Plan / Recommendation Comments on Treatment Session Pt with better attention and endurance compared to previous session.  Able to walk aorun the bed and to his bedside chair during simulated toilet transfer with mod assist (approximately 15 ft).  Will still need extensive SNF rehab at discharge.    Follow Up Recommendations  SNF       Equipment Recommendations  3 in 1 bedside comode       Frequency Min 2X/week   Plan Discharge plan remains appropriate    Precautions / Restrictions Precautions Precautions: Fall;Back Precaution Comments: reinforced back precautions  and log rolling with functional mobility during session Required Braces or Orthoses: Spinal Brace Spinal Brace: Thoracolumbosacral orthotic;Applied in sitting position Spinal Brace Comments: leg component discontinued today by MD   Pertinent Vitals/Pain Pain 2/10 on the faces scale.  Pt repositioned with back brace on    ADL  Grooming: Simulated;Set up Where Assessed - Grooming: Supported sitting Upper Body Dressing: +1 Total assistance (To donn TLSO in sitting) Where Assessed - Upper Body Dressing: Unsupported sitting Toilet Transfer: Simulated;Moderate assistance Toilet Transfer Method:  (ambulation ) Toilet Transfer Equipment: Other (comment) (simulated to bedside chair) Transfers/Ambulation Related to ADLs: Pt requires mod assist for mobility using the RW and back brace. ADL Comments: Pt with short step length during mobility and tends to maintain cervical flexion.  Pt demonstrates increased lean to the left in sitting as well.      OT Goals ADL Goals Pt Will Perform Grooming: with min assist;Standing at sink;Supported Pt Will Perform Lower Body Bathing: with min assist;Sit to stand from bed;with adaptive  equipment;Supported Pt Will Perform Lower Body Dressing: with min assist;Sit to stand from bed;Supported;with adaptive equipment Pt Will Transfer to Toilet: with min assist;with DME;Ambulation;3-in-1;Maintaining back safety precautions ADL Goal: Toilet Transfer - Progress: Progressing toward goals Pt Will Perform Toileting - Clothing Manipulation: with min assist;Sitting on 3-in-1 or toilet;Standing Pt Will Perform Toileting - Hygiene: with min assist;Sit to stand from 3-in-1/toilet Miscellaneous OT Goals OT Goal: Miscellaneous Goal #1 - Progress: Progressing toward goals  Visit Information  Last OT Received On: 11/09/12 Assistance Needed: +1    Subjective Data  Subjective: Your not gonna drop me are you? Patient Stated Goal: Pt did not state.      Cognition  Cognition Arousal/Alertness: Suspect due to medications Behavior During Therapy: WFL for tasks assessed/performed Overall Cognitive Status: Impaired/Different from baseline Current Attention Level: Sustained Following Commands: Follows one step commands with increased time Awareness: Intellectual General Comments: Pt still somewhat slow to process commands and maintain sustained attention, improved today compared to previous sessions.    Mobility  Bed Mobility Bed Mobility: Right Sidelying to Sit Right Sidelying to Sit: 2: Max assist;HOB flat Details for Bed Mobility Assistance: Pt needs max instructional cueing to sequence bed mobility and max assist to transition to sitting from sidelying. Transfers Transfers: Sit to Stand Sit to Stand: 3: Mod assist;From bed;With upper extremity assist Stand to Sit: 3: Mod assist;To chair/3-in-1 Details for Transfer Assistance: Pt needs mod instructional cueing for hand placement with sit to stand.       Balance Balance Balance Assessed: Yes Dynamic Standing Balance Dynamic Standing - Balance Support: Right upper extremity supported;Left upper extremity supported Dynamic Standing -  Level of Assistance: 3: Mod assist   End of Session OT - End  of Session Equipment Utilized During Treatment: Gait belt;Back brace Activity Tolerance: Patient limited by fatigue;Patient limited by pain Patient left: in chair;with call bell/phone within reach;with family/visitor present Nurse Communication: Mobility status     Cintya Daughety OTR/L Pager number [phone] 11/09/2012, 9:41 AM

## 2012-11-09 NOTE — Progress Notes (Signed)
ANTICOAGULATION CONSULT NOTE -Follow up  Pharmacy Consult for Coumadin Indication: h/o  atrial fibrillation  Allergies  Allergen Reactions  . Codeine Nausea And Vomiting  . Crestor (Rosuvastatin Calcium) Other (See Comments)    Leg cramps    Patient Measurements: Height: 5\' 8"  (172.7 cm) (from 10/29/12) Weight: 183 lb 9.6 oz (83.28 kg) (from 10/29/12) IBW/kg (Calculated) : 68.4   Vital Signs: Temp: 98.7 F (37.1 C) (06/08 1605) Temp src: Oral (06/08 1605) BP: 137/69 mmHg (06/08 1605) Pulse Rate: 63 (06/08 1605)  Labs:  Recent Labs  11/07/12 0520 11/09/12 0415  HGB 10.1*  --   HCT 30.5*  --   PLT 192  --   LABPROT  --  15.9*  INR  --  1.30  CREATININE 1.13  --     Estimated Creatinine Clearance: 53 ml/min (by C-G formula based on Cr of 1.13).   Medical History: Past Medical History  Diagnosis Date  . Hyperlipidemia   . Orthostatic hypotension   . Bradycardia   . Syncope     s/p PTVP  . Myocardial infarction     1981  . CAD (coronary artery disease)     prior CABG in 2002 negative Myoview in 2009  . Hypertension   . Stroke     TIA  2007    . GERD (gastroesophageal reflux disease)   . Headache(784.0)   . Arthritis     OSTEOPOROSIS  . Aortic stenosis, mild     by 01/08/08 echo  . Chronic anticoagulation   . Depression   . AAA (abdominal aortic aneurysm)   . Heart attack   . Pacemaker     medtronic pacer  . History of cervical spinal surgery     [place]  [name] HOH (hard of hearing)     BILATERAL HEARING AIDS  . Prostate cancer   . Basal cell adenocarcinoma     'bottom lip" (11/06/2012)  . Bladder cancer   . Attention to urostomy     "since 2006" (11/06/2012)  . Atrial fibrillation     on amiodarone and has PTVP in place  . Exertional shortness of breath   . Chronic radicular pain of lower back   . Anxiety     Medications:  Prescriptions prior to admission  Medication Sig Dispense Refill  . escitalopram (LEXAPRO) 20 MG tablet Take 20 mg  by mouth daily.       [name] gabapentin (NEURONTIN) 100 MG capsule Take 100 mg by mouth at bedtime.      . lidocaine (LIDODERM) 5 % Place 1 patch onto the skin daily as needed (for pain). Remove & Discard patch within 12 hours or as directed by MD      . metoprolol succinate (TOPROL-XL) 25 MG 24 hr tablet Take 25 mg by mouth daily.      . polyethylene glycol (MIRALAX / GLYCOLAX) packet Take 17 g by mouth daily as needed (for constipation).       . simvastatin (ZOCOR) 10 MG tablet Take 10 mg by mouth at bedtime.      [name] tiotropium (SPIRIVA) 18 MCG inhalation capsule Place 18 mcg into inhaler and inhale daily as needed (for shortness of breath).      . warfarin (COUMADIN) 5 MG tablet Take 5-7.5 mg by mouth daily. Takes  5mg  daily except 7.5mg  on saturdays      . zolpidem (AMBIEN) 10 MG tablet Take 10 mg by mouth at bedtime.       . [  DISCONTINUED] HYDROcodone-acetaminophen (NORCO) 5-325 MG per tablet Take 1 tablet by mouth every 4 (four) hours as needed for pain. For pain      . nitroGLYCERIN (NITROSTAT) 0.4 MG SL tablet Place 1 tablet (0.4 mg total) under the tongue every 5 (five) minutes as needed for chest pain.  25 tablet  prn   Scheduled:  . docusate sodium  100 mg Oral BID  . escitalopram  20 mg Oral Daily  . gabapentin  100 mg Oral QHS  . metoprolol succinate  25 mg Oral Daily  . simvastatin  10 mg Oral QHS  . sodium chloride  3 mL Intravenous Q12H  . Warfarin - Pharmacist Dosing Inpatient   Does not apply q1800  . zolpidem  5 mg Oral QHS    Assessment: INR is 1.3 today in this 79 y.o male with h/o Afib, on chronic coumadin PTA.  Dose PTA was 5 mg daily except 7.5mg  qSat  (last dose taken 10/31/12).  Today is POD #3 after lumbar decompression and L4/5 fusion for spinal stenosis and neurogenic claudication.  .  The INR was 1.13 on 11/06/12.  On 11/07/12  Hgb was  10.1 and PLTC 192, post op decrease from preop Hgb 13.6 and pltc 232K. No bleeding reported.     Goal of Therapy:  INR 2-3 Monitor  platelets by anticoagulation protocol: Yes   Plan:  Coumadin 7.5 mg po today x 1 PT/INR daily   Noah Delaine, RPh Clinical Pharmacist Pager: [phone] 11/09/2012,7:03 PM

## 2012-11-10 LAB — PROTIME-INR: Prothrombin Time: 15.4 seconds — ABNORMAL HIGH (ref 11.6–15.2)

## 2012-11-10 MED ORDER — WARFARIN SODIUM 10 MG PO TABS: ORAL_TABLET | ORAL | Status: DC

## 2012-11-10 MED ORDER — WARFARIN SODIUM 10 MG PO TABS
10.0000 mg | ORAL_TABLET | Freq: Once | ORAL | Status: DC
  Filled 2012-11-10: qty 1

## 2012-11-10 MED FILL — Heparin Sodium (Porcine) Inj 1000 Unit/ML: INTRAMUSCULAR | Qty: 30 | Status: AC

## 2012-11-10 MED FILL — Sodium Chloride IV Soln 0.9%: INTRAVENOUS | Qty: 1000 | Status: AC

## 2012-11-10 MED FILL — Sodium Chloride Irrigation Soln 0.9%: Qty: 3000 | Status: AC

## 2012-11-10 NOTE — Progress Notes (Signed)
ANTICOAGULATION CONSULT NOTE -Follow up  Pharmacy Consult for Coumadin Indication: h/o  atrial fibrillation  Allergies  Allergen Reactions  . Codeine Nausea And Vomiting  . Crestor (Rosuvastatin Calcium) Other (See Comments)    Leg cramps   Patient Measurements: Height: 5\' 8"  (172.7 cm) (from 10/29/12) Weight: 183 lb 9.6 oz (83.28 kg) (from 10/29/12) IBW/kg (Calculated) : 68.4  Vital Signs: Temp: 98.2 F (36.8 C) (06/09 0633) BP: 151/63 mmHg (06/09 0633) Pulse Rate: 63 (06/09 0633)  Labs:  Recent Labs  11/09/12 0415 11/10/12 0410  LABPROT 15.9* 15.4*  INR 1.30 1.24   Estimated Creatinine Clearance: 53 ml/min (by C-G formula based on Cr of 1.13).  Medical History: Past Medical History  Diagnosis Date  . Hyperlipidemia   . Orthostatic hypotension   . Bradycardia   . Syncope     s/p PTVP  . Myocardial infarction     1981  . CAD (coronary artery disease)     prior CABG in 2002 negative Myoview in 2009  . Hypertension   . Stroke     TIA  2007    . GERD (gastroesophageal reflux disease)   . Headache(784.0)   . Arthritis     OSTEOPOROSIS  . Aortic stenosis, mild     by 01/08/08 echo  . Chronic anticoagulation   . Depression   . AAA (abdominal aortic aneurysm)   . Heart attack   . Pacemaker     medtronic pacer  . History of cervical spinal surgery     [place]  [name] HOH (hard of hearing)     BILATERAL HEARING AIDS  . Prostate cancer   . Basal cell adenocarcinoma     'bottom lip" (11/06/2012)  . Bladder cancer   . Attention to urostomy     "since 2006" (11/06/2012)  . Atrial fibrillation     on amiodarone and has PTVP in place  . Exertional shortness of breath   . Chronic radicular pain of lower back   . Anxiety    Medications:  Prescriptions prior to admission  Medication Sig Dispense Refill  . escitalopram (LEXAPRO) 20 MG tablet Take 20 mg by mouth daily.       [name] gabapentin (NEURONTIN) 100 MG capsule Take 100 mg by mouth at bedtime.      .  lidocaine (LIDODERM) 5 % Place 1 patch onto the skin daily as needed (for pain). Remove & Discard patch within 12 hours or as directed by MD      . metoprolol succinate (TOPROL-XL) 25 MG 24 hr tablet Take 25 mg by mouth daily.      . polyethylene glycol (MIRALAX / GLYCOLAX) packet Take 17 g by mouth daily as needed (for constipation).       . simvastatin (ZOCOR) 10 MG tablet Take 10 mg by mouth at bedtime.      [name] tiotropium (SPIRIVA) 18 MCG inhalation capsule Place 18 mcg into inhaler and inhale daily as needed (for shortness of breath).      . warfarin (COUMADIN) 5 MG tablet Take 5-7.5 mg by mouth daily. Takes  5mg  daily except 7.5mg  on saturdays      . zolpidem (AMBIEN) 10 MG tablet Take 10 mg by mouth at bedtime.       . [DISCONTINUED] HYDROcodone-acetaminophen (NORCO) 5-325 MG per tablet Take 1 tablet by mouth every 4 (four) hours as needed for pain. For pain      . nitroGLYCERIN (NITROSTAT) 0.4 MG SL tablet Place 1  tablet (0.4 mg total) under the tongue every 5 (five) minutes as needed for chest pain.  25 tablet  prn   Scheduled:  . docusate sodium  100 mg Oral BID  . escitalopram  20 mg Oral Daily  . gabapentin  100 mg Oral QHS  . metoprolol succinate  25 mg Oral Daily  . simvastatin  10 mg Oral QHS  . sodium chloride  3 mL Intravenous Q12H  . Warfarin - Pharmacist Dosing Inpatient   Does not apply q1800  . zolpidem  5 mg Oral QHS   Assessment: INR is 1.24 today in this 79 y.o male with h/o Afib, on chronic coumadin PTA.  Today is POD #4 after lumbar decompression and L4/5 fusion for spinal stenosis and neurogenic claudication.  Doses charted as given.  His Hgb was  10.1 and PLTC 192 on 6/6 and there has been no bleeding reported.   HOME Dose PTA was 5 mg daily except 7.5mg  qSat  (last dose taken 10/31/12).    Goal of Therapy:  INR 2-3 Monitor platelets by anticoagulation protocol: Yes   Plan:  Coumadin 10 mg po today x 1 PT/INR daily  Nadara Mustard, PharmD., MS Clinical  Pharmacist Pager:  [phone] Thank you for allowing pharmacy to be part of this patients care team. 11/10/2012,11:41 AM

## 2012-11-10 NOTE — Progress Notes (Signed)
Patient doing well, minimal leg pain. + LBP. Improving daily with PT and OT, still mildly sedated secondary to medications.  BP 151/63  Pulse 63  Temp(Src) 98.2 F (36.8 C) (Oral)  Resp 18  Ht 5\' 8"  (1.727 m)  Wt 83.28 kg (183 lb 9.6 oz)  BMI 27.92 kg/m2  SpO2 99%  Patient awoken from sleep, moderately groggy and sedated, answers questions appropriately.  NVI  Dressing CDI   POD #4 after lumbar decompression and L4/5 fusion  - continue PT/OT today  - continue oral pain medicine only, percocet and valium  -Scripts in chart for D/C - continue coumadin per pharmacy  - per social worker bed available today, will plan to transfer to SNF  -D/C to SNF today  - TLSO at all times when OOB

## 2012-11-10 NOTE — Discharge Summary (Signed)
Physician Discharge Summary  Patient ID: Peter Richards MRN: [id number] DOB/AGE: 1928/10/08 79 y.o.  Admit date: 11/06/2012 Discharge date: 11/10/2012  Admission Diagnoses: Neurogenic claudication secondary to spinal stenosis  Discharge Diagnoses: Improved S/P sx, requires SNF for additional rehabilitation   Discharged Condition: improved S/P sx intervention   Hospital Course: Uncomplicated. Pt was taken to sx on 11/06/2012 and underwent lumbar decompression and L4-5 Fusion without complication. Pt has had improvement in BIL leg px and expected post op LBP improving daily. Has been tolerating oral pain meds and PT/OT but still requires additional rehabilitation. Will be transferred to SNF today. Will continue with TLSO brace at all times when out of bed and back precautions. Coumadin has been restarted and managed by pharmacy.   Discharge Exam: Blood pressure 151/63, pulse 63, temperature 98.2 F (36.8 C), temperature source Oral, resp. rate 18, height 5\' 8"  (1.727 m), weight 83.28 kg (183 lb 9.6 oz), SpO2 99.00%. Pt awoken from sleep, moderately groggy and sedated but answers questions appropriately. - Homans, 2+ DPP BIL, dressing CDI, NVI, neurovascularly intact, sensation intact.   Disposition: D/C to SNF   Future Appointments Provider Department Dept Phone   11/17/2012 12:05 PM [facility] Main Office [place]) [phone]       Medication List    STOP taking these medications       HYDROcodone-acetaminophen 5-325 MG per tablet  Commonly known as:  NORCO/VICODIN      TAKE these medications       escitalopram 20 MG tablet  Commonly known as:  LEXAPRO  Take 20 mg by mouth daily.     gabapentin 100 MG capsule  Commonly known as:  NEURONTIN  Take 100 mg by mouth at bedtime.     lidocaine 5 %  Commonly known as:  LIDODERM  Place 1 patch onto the skin daily as needed (for pain). Remove & Discard patch within 12 hours or as directed by MD     metoprolol succinate 25 MG 24 hr tablet  Commonly known as:  TOPROL-XL  Take 25 mg by mouth daily.     nitroGLYCERIN 0.4 MG SL tablet  Commonly known as:  NITROSTAT  Place 1 tablet (0.4 mg total) under the tongue every 5 (five) minutes as needed for chest pain.     polyethylene glycol packet  Commonly known as:  MIRALAX / GLYCOLAX  Take 17 g by mouth daily as needed (for constipation).     simvastatin 10 MG tablet  Commonly known as:  ZOCOR  Take 10 mg by mouth at bedtime.     tiotropium 18 MCG inhalation capsule  Commonly known as:  SPIRIVA  Place 18 mcg into inhaler and inhale daily as needed (for shortness of breath).     warfarin 5 MG tablet  Commonly known as:  COUMADIN  Take 5-7.5 mg by mouth daily. Takes  5mg  daily except 7.5mg  on saturdays     zolpidem 10 MG tablet  Commonly known as:  AMBIEN  Take 10 mg by mouth at bedtime.        Patient doing well, minimal leg pain. + LBP. Improving daily with PT and OT, still mildly sedated secondary to medications.  POD #4 after lumbar decompression and L4/5 fusion  - continue PT/OT today  - continue oral pain medicine only, percocet and valium  -Scripts in chart for D/C - continue coumadin per pharmacy  - per social worker bed available today, will plan to transfer to SNF  -  D/C to SNF today  - TLSO at all times when OOB -F/U in office 2wks PO, appt card in D/C packet  Signed: Georga Bora 11/10/2012, 7:25 AM

## 2012-11-10 NOTE — Progress Notes (Signed)
MD, patient refused SCD's but requested the knee high teds.  I applied the teds.  Please adjust order in the computer. Thank you.

## 2012-11-10 NOTE — Care Management Note (Signed)
CARE MANAGEMENT NOTE 11/10/2012  Patient:  Peter Richards, Peter Richards   Account Number:  [id number]  Date Initiated:  11/07/2012  Documentation initiated by:  [facility]  Subjective/Objective Assessment:   L2-L5 decompression and L4-5 Fusion for spinal stenosis and neurogenic claudication     Action/Plan:   SNF placement, waiting PT/OT eval and tx   Anticipated DC Date:  11/10/2012   Anticipated DC Plan:  SKILLED NURSING FACILITY      DC Planning Services  CM consult      Choice offered to / List presented to:             Status of service:  Completed, signed off Medicare Important Message given?   (If response is "NO", the following Medicare IM given date fields will be blank) Date Medicare IM given:   Date Additional Medicare IM given:    Discharge Disposition:  SKILLED NURSING FACILITY  Per UR Regulation:  Reviewed for med. necessity/level of care/duration of stay  If discussed at Long Length of Stay Meetings, dates discussed:    Comments:

## 2012-11-10 NOTE — Progress Notes (Signed)
Discharged via stretcher to [facility] for inpatient rehab. Peter Richards was a&o x4 at time of discharge. Family at bedside. Report called to Dominic Pea, RN @ [address]

## 2012-11-10 NOTE — Progress Notes (Signed)
Physical Therapy Treatment Patient Details Name: Peter Richards MRN: [id number] DOB: 01/29/1929 Today's Date: 11/10/2012 Time: 2952-8413 PT Time Calculation (min): 30 min  PT Assessment / Plan / Recommendation Comments on Treatment Session  Continuing progress with mobility; Plans for dc to SNF for postacute rehab today    Follow Up Recommendations  SNF;Supervision/Assistance - 24 hour;Supervision for mobility/OOB     Does the patient have the potential to tolerate intense rehabilitation     Barriers to Discharge        Equipment Recommendations  Rolling walker with 5" wheels    Recommendations for Other Services    Frequency Min 6X/week   Plan Discharge plan remains appropriate;Frequency remains appropriate    Precautions / Restrictions Precautions Precautions: Fall;Back Precaution Comments: reinforced back prec Required Braces or Orthoses: Spinal Brace Spinal Brace: Thoracolumbosacral orthotic;Applied in sitting position   Pertinent Vitals/Pain 5/10 back pain; repositioned as comfortably as possible in chair    Mobility  Transfers Transfers: Sit to Stand;Stand to Sit Sit to Stand: 3: Mod assist;From bed;With upper extremity assist Stand to Sit: 3: Mod assist;To chair/3-in-1 Details for Transfer Assistance: Pt needs mod instructional cueing for hand placement with sit to stand. Ambulation/Gait Ambulation/Gait Assistance: 1: +2 Total assist Ambulation/Gait: Patient Percentage: 70% Ambulation Distance (Feet): 45 Feet Assistive device: Rolling walker Ambulation/Gait Assistance Details: Cues for Rw proximity and for upright posture Gait Pattern: Step-through pattern;Decreased stride length;Decreased step length - right;Decreased step length - left;Shuffle;Trunk flexed;Narrow base of support General Gait Details: Cues to widen step width for gigger base of support    Exercises     PT Diagnosis:    PT Problem List:   PT Treatment Interventions:     PT Goals Acute  Rehab PT Goals Time For Goal Achievement: 11/14/12 Potential to Achieve Goals: Fair Pt will go Sit to Stand: with supervision PT Goal: Sit to Stand - Progress: Progressing toward goal Pt will go Stand to Sit: with supervision PT Goal: Stand to Sit - Progress: Progressing toward goal Pt will Ambulate: 51 - 150 feet;with supervision;with rolling walker PT Goal: Ambulate - Progress: Progressing toward goal Additional Goals Additional Goal #1: pt. will state and comply with 3/3 back precautions and log rolling technique PT Goal: Additional Goal #1 - Progress: Progressing toward goal  Visit Information  Last PT Received On: 11/10/12 Assistance Needed: +2    Subjective Data  Subjective: Really wants to know the plan for today; what time will he be transferred to SNF Patient Stated Goal: to rehab then home at [name] (per wife)   Cognition  Cognition Arousal/Alertness: Awake/alert Behavior During Therapy: WFL for tasks assessed/performed Overall Cognitive Status: Within Functional Limits for tasks assessed    Balance     End of Session PT - End of Session Equipment Utilized During Treatment: Gait belt;Back brace Activity Tolerance: Patient tolerated treatment well Patient left: in chair;with call bell/phone within reach;with family/visitor present Nurse Communication: Mobility status   GP     Peter Richards [place],  [phone]  11/10/2012, 12:54 PM

## 2012-11-10 NOTE — Progress Notes (Signed)
Clinical social worker assisted with patient discharge to skilled nursing facility, [name] [place].  CSW addressed all family questions and concerns. CSW copied chart and added all important documents. CSW also set up patient transportation with Mult[employer]. Clinical Social Worker will sign off for now as social work intervention is no longer needed.   Sabino Niemann, MSW, [phone]

## 2012-11-15 ENCOUNTER — Emergency Department (HOSPITAL_COMMUNITY): Payer: Medicare Other

## 2012-11-15 ENCOUNTER — Encounter (HOSPITAL_COMMUNITY): Payer: Self-pay | Admitting: Physical Medicine and Rehabilitation

## 2012-11-15 ENCOUNTER — Inpatient Hospital Stay (HOSPITAL_COMMUNITY)
Admission: EM | Admit: 2012-11-15 | Discharge: 2012-11-19 | DRG: 948 | Disposition: A | Discharge status: Skilled Nursing Facility | Payer: Medicare Other | Attending: Orthopedic Surgery | Admitting: Orthopedic Surgery

## 2012-11-15 DIAGNOSIS — G8918 Other acute postprocedural pain: Principal | ICD-10-CM | POA: Diagnosis present

## 2012-11-15 DIAGNOSIS — I2510 Atherosclerotic heart disease of native coronary artery without angina pectoris: Secondary | ICD-10-CM | POA: Diagnosis present

## 2012-11-15 DIAGNOSIS — Z951 Presence of aortocoronary bypass graft: Secondary | ICD-10-CM

## 2012-11-15 DIAGNOSIS — Z85828 Personal history of other malignant neoplasm of skin: Secondary | ICD-10-CM

## 2012-11-15 DIAGNOSIS — I4891 Unspecified atrial fibrillation: Secondary | ICD-10-CM | POA: Diagnosis present

## 2012-11-15 DIAGNOSIS — Z87891 Personal history of nicotine dependence: Secondary | ICD-10-CM

## 2012-11-15 DIAGNOSIS — E785 Hyperlipidemia, unspecified: Secondary | ICD-10-CM | POA: Diagnosis present

## 2012-11-15 DIAGNOSIS — Z79899 Other long term (current) drug therapy: Secondary | ICD-10-CM

## 2012-11-15 DIAGNOSIS — I359 Nonrheumatic aortic valve disorder, unspecified: Secondary | ICD-10-CM | POA: Diagnosis present

## 2012-11-15 DIAGNOSIS — Z950 Presence of cardiac pacemaker: Secondary | ICD-10-CM

## 2012-11-15 DIAGNOSIS — Z8673 Personal history of transient ischemic attack (TIA), and cerebral infarction without residual deficits: Secondary | ICD-10-CM

## 2012-11-15 DIAGNOSIS — Z8551 Personal history of malignant neoplasm of bladder: Secondary | ICD-10-CM

## 2012-11-15 DIAGNOSIS — Z981 Arthrodesis status: Secondary | ICD-10-CM

## 2012-11-15 DIAGNOSIS — Z8546 Personal history of malignant neoplasm of prostate: Secondary | ICD-10-CM

## 2012-11-15 DIAGNOSIS — F329 Major depressive disorder, single episode, unspecified: Secondary | ICD-10-CM | POA: Diagnosis present

## 2012-11-15 DIAGNOSIS — H9190 Unspecified hearing loss, unspecified ear: Secondary | ICD-10-CM | POA: Diagnosis present

## 2012-11-15 DIAGNOSIS — F411 Generalized anxiety disorder: Secondary | ICD-10-CM | POA: Diagnosis present

## 2012-11-15 DIAGNOSIS — R45851 Suicidal ideations: Secondary | ICD-10-CM

## 2012-11-15 DIAGNOSIS — M79609 Pain in unspecified limb: Secondary | ICD-10-CM | POA: Diagnosis present

## 2012-11-15 DIAGNOSIS — Z7901 Long term (current) use of anticoagulants: Secondary | ICD-10-CM

## 2012-11-15 DIAGNOSIS — F3289 Other specified depressive episodes: Secondary | ICD-10-CM | POA: Diagnosis present

## 2012-11-15 DIAGNOSIS — I252 Old myocardial infarction: Secondary | ICD-10-CM

## 2012-11-15 DIAGNOSIS — M545 Low back pain, unspecified: Secondary | ICD-10-CM | POA: Diagnosis present

## 2012-11-15 DIAGNOSIS — I10 Essential (primary) hypertension: Secondary | ICD-10-CM | POA: Diagnosis present

## 2012-11-15 DIAGNOSIS — M549 Dorsalgia, unspecified: Secondary | ICD-10-CM

## 2012-11-15 LAB — COMPREHENSIVE METABOLIC PANEL
Albumin: 3 g/dL — ABNORMAL LOW (ref 3.5–5.2)
BUN: 19 mg/dL (ref 6–23)
Calcium: 9.1 mg/dL (ref 8.4–10.5)
Creatinine, Ser: 0.9 mg/dL (ref 0.50–1.35)
GFR calc Af Amer: 89 mL/min — ABNORMAL LOW (ref >90)
Glucose, Bld: 135 mg/dL — ABNORMAL HIGH (ref 70–99)
Potassium: 4.6 mEq/L (ref 3.5–5.1)
Total Protein: 7.2 g/dL (ref 6.0–8.3)

## 2012-11-15 LAB — PROTIME-INR
INR: 2.68 — ABNORMAL HIGH (ref 0.00–1.49)
Prothrombin Time: 27.2 seconds — ABNORMAL HIGH (ref 11.6–15.2)

## 2012-11-15 LAB — URINALYSIS, ROUTINE W REFLEX MICROSCOPIC
Glucose, UA: NEGATIVE mg/dL
Ketones, ur: NEGATIVE mg/dL
Leukocytes, UA: NEGATIVE
Nitrite: POSITIVE — AB
Specific Gravity, Urine: 1.021 (ref 1.005–1.030)
pH: 6 (ref 5.0–8.0)

## 2012-11-15 LAB — CBC WITH DIFFERENTIAL/PLATELET
Basophils Relative: 1 % (ref 0–1)
Eosinophils Absolute: 0.3 10*3/uL (ref 0.0–0.7)
Eosinophils Relative: 3 % (ref 0–5)
Hemoglobin: 10.9 g/dL — ABNORMAL LOW (ref 13.0–17.0)
Lymphs Abs: 2.8 10*3/uL (ref 0.7–4.0)
MCH: 30.5 pg (ref 26.0–34.0)
MCHC: 33.9 g/dL (ref 30.0–36.0)
MCV: 90.2 fL (ref 78.0–100.0)
Monocytes Relative: 12 % (ref 3–12)
Neutrophils Relative %: 57 % (ref 43–77)
RBC: 3.57 MIL/uL — ABNORMAL LOW (ref 4.22–5.81)

## 2012-11-15 LAB — URINE MICROSCOPIC-ADD ON

## 2012-11-15 MED ORDER — WARFARIN - PHYSICIAN DOSING INPATIENT: Freq: Every day | Status: DC

## 2012-11-15 MED ORDER — ZOLPIDEM TARTRATE 5 MG PO TABS
5.0000 mg | ORAL_TABLET | Freq: Every day | ORAL | Status: DC
  Administered 2012-11-15 – 2012-11-18 (×4): 5 mg via ORAL
  Filled 2012-11-15 (×4): qty 1

## 2012-11-15 MED ORDER — DOCUSATE SODIUM 100 MG PO CAPS
100.0000 mg | ORAL_CAPSULE | Freq: Two times a day (BID) | ORAL | Status: DC
  Administered 2012-11-15 – 2012-11-19 (×8): 100 mg via ORAL
  Filled 2012-11-15 (×9): qty 1

## 2012-11-15 MED ORDER — ESCITALOPRAM OXALATE 20 MG PO TABS: 20.0000 mg | ORAL_TABLET | Freq: Every day | ORAL | Status: DC

## 2012-11-15 MED ORDER — BISACODYL 5 MG PO TBEC: 5.0000 mg | DELAYED_RELEASE_TABLET | Freq: Every day | ORAL | Status: DC | PRN

## 2012-11-15 MED ORDER — SIMVASTATIN 10 MG PO TABS
10.0000 mg | ORAL_TABLET | Freq: Every day | ORAL | Status: DC
  Administered 2012-11-15 – 2012-11-18 (×4): 10 mg via ORAL
  Filled 2012-11-15 (×5): qty 1

## 2012-11-15 MED ORDER — ZOLPIDEM TARTRATE 5 MG PO TABS: 10.0000 mg | ORAL_TABLET | Freq: Every day | ORAL | Status: DC

## 2012-11-15 MED ORDER — DIAZEPAM 5 MG PO TABS
5.0000 mg | ORAL_TABLET | Freq: Four times a day (QID) | ORAL | Status: DC | PRN
  Administered 2012-11-16 – 2012-11-18 (×4): 5 mg via ORAL
  Filled 2012-11-15 (×5): qty 1

## 2012-11-15 MED ORDER — ESCITALOPRAM OXALATE 20 MG PO TABS
20.0000 mg | ORAL_TABLET | Freq: Every day | ORAL | Status: DC
  Administered 2012-11-16 – 2012-11-18 (×3): 20 mg via ORAL
  Filled 2012-11-15 (×3): qty 1

## 2012-11-15 MED ORDER — POLYETHYLENE GLYCOL 3350 17 G PO PACK
17.0000 g | PACK | Freq: Every day | ORAL | Status: DC | PRN
  Administered 2012-11-17: 17 g via ORAL
  Filled 2012-11-15: qty 1

## 2012-11-15 MED ORDER — NITROGLYCERIN 0.4 MG SL SUBL: 0.4000 mg | SUBLINGUAL_TABLET | SUBLINGUAL | Status: DC | PRN

## 2012-11-15 MED ORDER — ONDANSETRON HCL 4 MG/2ML IJ SOLN: 4.0000 mg | Freq: Four times a day (QID) | INTRAMUSCULAR | Status: DC | PRN

## 2012-11-15 MED ORDER — HYDROMORPHONE HCL PF 1 MG/ML IJ SOLN
0.5000 mg | INTRAMUSCULAR | Status: DC | PRN
  Administered 2012-11-15 – 2012-11-18 (×14): 1 mg via INTRAVENOUS
  Filled 2012-11-15 (×14): qty 1

## 2012-11-15 MED ORDER — ONDANSETRON HCL 4 MG PO TABS: 4.0000 mg | ORAL_TABLET | Freq: Four times a day (QID) | ORAL | Status: DC | PRN

## 2012-11-15 MED ORDER — ACETAMINOPHEN 650 MG RE SUPP: 650.0000 mg | Freq: Four times a day (QID) | RECTAL | Status: DC | PRN

## 2012-11-15 MED ORDER — MORPHINE SULFATE 4 MG/ML IJ SOLN
4.0000 mg | Freq: Once | INTRAMUSCULAR | Status: AC
  Administered 2012-11-15: 4 mg via INTRAVENOUS
  Filled 2012-11-15 (×2): qty 1

## 2012-11-15 MED ORDER — METOPROLOL SUCCINATE ER 25 MG PO TB24
25.0000 mg | ORAL_TABLET | Freq: Every day | ORAL | Status: DC
  Administered 2012-11-16 – 2012-11-19 (×4): 25 mg via ORAL
  Filled 2012-11-15 (×4): qty 1

## 2012-11-15 MED ORDER — ALUM & MAG HYDROXIDE-SIMETH 200-200-20 MG/5ML PO SUSP: 30.0000 mL | Freq: Four times a day (QID) | ORAL | Status: DC | PRN

## 2012-11-15 MED ORDER — SODIUM CHLORIDE 0.9 % IJ SOLN: 3.0000 mL | INTRAMUSCULAR | Status: DC | PRN

## 2012-11-15 MED ORDER — ONDANSETRON HCL 4 MG/2ML IJ SOLN
4.0000 mg | Freq: Once | INTRAMUSCULAR | Status: AC
  Administered 2012-11-15: 4 mg via INTRAVENOUS
  Filled 2012-11-15: qty 2

## 2012-11-15 MED ORDER — SODIUM CHLORIDE 0.9 % IJ SOLN
3.0000 mL | Freq: Two times a day (BID) | INTRAMUSCULAR | Status: DC
  Administered 2012-11-16 – 2012-11-18 (×4): 3 mL via INTRAVENOUS

## 2012-11-15 MED ORDER — GABAPENTIN 300 MG PO CAPS
300.0000 mg | ORAL_CAPSULE | Freq: Every day | ORAL | Status: DC
  Administered 2012-11-15 – 2012-11-17 (×3): 300 mg via ORAL
  Filled 2012-11-15 (×4): qty 1

## 2012-11-15 MED ORDER — TIOTROPIUM BROMIDE MONOHYDRATE 18 MCG IN CAPS: 18.0000 ug | ORAL_CAPSULE | Freq: Every day | RESPIRATORY_TRACT | Status: DC | PRN

## 2012-11-15 MED ORDER — ACETAMINOPHEN 325 MG PO TABS: 650.0000 mg | ORAL_TABLET | Freq: Four times a day (QID) | ORAL | Status: DC | PRN

## 2012-11-15 MED ORDER — HYDROMORPHONE HCL 2 MG PO TABS
2.0000 mg | ORAL_TABLET | ORAL | Status: DC | PRN
  Administered 2012-11-16: 2 mg via ORAL
  Administered 2012-11-16 – 2012-11-17 (×2): 4 mg via ORAL
  Administered 2012-11-17: 2 mg via ORAL
  Administered 2012-11-18 – 2012-11-19 (×6): 4 mg via ORAL
  Filled 2012-11-15 (×7): qty 2
  Filled 2012-11-15: qty 1
  Filled 2012-11-15 (×3): qty 2

## 2012-11-15 MED ORDER — OXYCODONE-ACETAMINOPHEN 5-325 MG PO TABS
2.0000 | ORAL_TABLET | Freq: Once | ORAL | Status: AC
  Administered 2012-11-15: 2 via ORAL
  Filled 2012-11-15: qty 2

## 2012-11-15 MED ORDER — WARFARIN SODIUM 6 MG PO TABS
6.0000 mg | ORAL_TABLET | Freq: Every day | ORAL | Status: DC
  Administered 2012-11-15: 6 mg via ORAL
  Filled 2012-11-15 (×2): qty 1

## 2012-11-15 MED ORDER — SODIUM CHLORIDE 0.9 % IV SOLN: 250.0000 mL | INTRAVENOUS | Status: DC | PRN

## 2012-11-15 MED ORDER — IOHEXOL 350 MG/ML SOLN
80.0000 mL | Freq: Once | INTRAVENOUS | Status: AC | PRN
  Administered 2012-11-15: 80 mL via INTRAVENOUS

## 2012-11-15 NOTE — ED Notes (Signed)
Pt resting quietly at the time. Vital signs stable. States 5/10 back and bilateral leg pain. No signs of acute distress noted. Family at bedside. He remains alert and oriented x4.

## 2012-11-15 NOTE — ED Notes (Signed)
Attempted to give floor nurse report. Unable at the time. Nurse to call back.

## 2012-11-15 NOTE — ED Notes (Signed)
Pt transported to CT scan.

## 2012-11-15 NOTE — H&P (Signed)
Peter Richards is an 79 y.o. male.   Chief Complaint: *Severe bilateral lower extremity pain HPI: The patient is an 79 year old gentleman who underwent a lumbar decompression and fusion about 9 days ago. He initially had decreased pain in both legs after surgery but has had progressively worsening pain in his legs over the last week. He was at a rehabilitation facility and has been brought back to the emergency department today due to unrelenting pain which has not been able to be controlled with oral medications. He has been taking hydrocodone which he has been on for several years. He denies incontinence of bowel. He has had a normal bowel movement within the last 24 hours. He has not been able to ambulate well since surgery.  Past Medical History  Diagnosis Date  . Hyperlipidemia   . Orthostatic hypotension   . Bradycardia   . Syncope     s/p PTVP  . Myocardial infarction     1981  . CAD (coronary artery disease)     prior CABG in 2002 negative Myoview in 2009  . Hypertension   . Stroke     TIA  2007    . GERD (gastroesophageal reflux disease)   . Headache(784.0)   . Arthritis     OSTEOPOROSIS  . Aortic stenosis, mild     by 01/08/08 echo  . Chronic anticoagulation   . Depression   . AAA (abdominal aortic aneurysm)   . Heart attack   . Pacemaker     medtronic pacer  . History of cervical spinal surgery     over [place]  [name] HOH (hard of hearing)     BILATERAL HEARING AIDS  . Prostate cancer   . Basal cell adenocarcinoma     'bottom lip" (11/06/2012)  . Bladder cancer   . Attention to urostomy     "since 2006" (11/06/2012)  . Atrial fibrillation     on amiodarone and has PTVP in place  . Exertional shortness of breath   . Chronic radicular pain of lower back   . Anxiety     Past Surgical History  Procedure Laterality Date  . Insert / replace / remove pacemaker      11/2009  DR BRODIE    . Prostatectomy      BLADDER ALSO REMOVED (OSTOMY BAG)  2006  FOR CANCER      . Abdominal aortic aneurysm repair  1999    STENT PLACED    . Cataract extraction w/ intraocular lens  implant, bilateral    . Appendectomy      1959  . Revision urostomy cutaneous    . Revision urostomy cutaneous  2006    Urostomy bag placed on right lower abdomen  . Vertebroplasty  08/22/2011    Procedure: VERTEBROPLASTY;  Surgeon: Carmela Hurt, MD;  Location: [facility];  Service: Neurosurgery;  Laterality: N/A;  Lumbar Two vertebroplasty  . Cholecystectomy      4 YRS AGO  . Posterior lumbar fusion  11/06/2012  . Coronary artery bypass graft  1999    CABG X2  . Skin cancer excision      "bottom lip" (11/06/2012)  . Bladder surgery      "for cancer" (11/06/2012)  . Lumbar laminectomy/decompression microdiscectomy Bilateral 11/06/2012    Procedure: LUMBAR LAMINECTOMY/DECOMPRESSION MICRODISCECTOMY;  Surgeon: Emilee Hero, MD;  Location: [facility] OR;  Service: Orthopedics;  Laterality: Bilateral;  Lumbar 2-5 decompression    Family History  Problem Relation Age  of Onset  . Cancer Father     stomach  . Cancer Sister     lung   Social History:  reports that he quit smoking about 34 years ago. His smoking use included Cigarettes. He has a 150 pack-year smoking history. He has never used smokeless tobacco. He reports that he does not drink alcohol or use illicit drugs.  Allergies:  Allergies  Allergen Reactions  . Codeine Nausea And Vomiting  . Crestor (Rosuvastatin Calcium) Other (See Comments)    Leg cramps     (Not in a hospital admission)  Results for orders placed during the hospital encounter of 11/15/12 (from the past 48 hour(s))  CBC WITH DIFFERENTIAL     Status: Abnormal   Collection Time    11/15/12  2:40 PM      Result Value Range   WBC 10.0  4.0 - 10.5 K/uL   RBC 3.57 (*) 4.22 - 5.81 MIL/uL   Hemoglobin 10.9 (*) 13.0 - 17.0 g/dL   HCT 16.1 (*) 09.6 - 04.5 %   MCV 90.2  78.0 - 100.0 fL   MCH 30.5  26.0 - 34.0 pg   MCHC 33.9  30.0 - 36.0 g/dL   RDW 40.9   81.1 - 91.4 %   Platelets 412 (*) 150 - 400 K/uL   Neutrophils Relative % 57  43 - 77 %   Neutro Abs 5.7  1.7 - 7.7 K/uL   Lymphocytes Relative 28  12 - 46 %   Lymphs Abs 2.8  0.7 - 4.0 K/uL   Monocytes Relative 12  3 - 12 %   Monocytes Absolute 1.2 (*) 0.1 - 1.0 K/uL   Eosinophils Relative 3  0 - 5 %   Eosinophils Absolute 0.3  0.0 - 0.7 K/uL   Basophils Relative 1  0 - 1 %   Basophils Absolute 0.1  0.0 - 0.1 K/uL  COMPREHENSIVE METABOLIC PANEL     Status: Abnormal   Collection Time    11/15/12  2:40 PM      Result Value Range   Sodium 137  135 - 145 mEq/L   Potassium 4.6  3.5 - 5.1 mEq/L   Comment: HEMOLYSIS AT THIS LEVEL MAY AFFECT RESULT   Chloride 103  96 - 112 mEq/L   CO2 24  19 - 32 mEq/L   Glucose, Bld 135 (*) 70 - 99 mg/dL   BUN 19  6 - 23 mg/dL   Creatinine, Ser 7.82  0.50 - 1.35 mg/dL   Calcium 9.1  8.4 - 95.6 mg/dL   Total Protein 7.2  6.0 - 8.3 g/dL   Albumin 3.0 (*) 3.5 - 5.2 g/dL   AST 26  0 - 37 U/L   ALT 14  0 - 53 U/L   Alkaline Phosphatase 88  39 - 117 U/L   Total Bilirubin 0.2 (*) 0.3 - 1.2 mg/dL   GFR calc non Af Amer 77 (*) >90 mL/min   GFR calc Af Amer 89 (*) >90 mL/min   Comment:            The eGFR has been calculated     using the CKD EPI equation.     This calculation has not been     validated in all clinical     situations.     eGFR's persistently     <90 mL/min signify     possible Chronic Kidney Disease.  PROTIME-INR     Status: Abnormal   Collection  Time    11/15/12  2:40 PM      Result Value Range   Prothrombin Time 27.2 (*) 11.6 - 15.2 seconds   INR 2.68 (*) 0.00 - 1.49   Ct Lumbar Spine Wo Contrast  11/15/2012   *RADIOLOGY REPORT*  Clinical Data: Increasing back pain.  L4-5 fusion 1 week ago.  CT LUMBAR SPINE WITHOUT CONTRAST  Technique:  Multidetector CT imaging of the lumbar spine was performed without intravenous contrast administration. Multiplanar CT image reconstructions were also generated.  Comparison: 10/07/2012 post  myelogram CT.  CT angiogram performed at the same time.  Findings: Previously transitional vertebra was labeled S1. Utilizing this level assignment, the current examination incorporates from the T7-T8 disc space to the lower sacrum.  Sequential pacemaker is in place.  Aneurysm of the abdominal aorta and iliac arteries.  Please see CT angiogram performed the same date. The right common iliac arteries dilated up to 1.9 cm.  T7-8:  Negative.  T8-9:  Foraminal narrowing greater on the right.  T9-10:  Mild right foraminal narrowing.  T10-11:  Negative.  T11-12:  Vacuum disc phenomenon with tiny left posterior lateral gas containing disc. Mild spinal stenosis.  Mild foraminal narrowing bilaterally.  T12-L1:  Minimal bulge greater to the right.  L1-2:  Mild retrolisthesis L1.  Bulge and rotation.  Mild spinal stenosis.  Foraminal narrowing greater on the left.  L2-3:  Prior cement augmentation L2 with similar appearance of the Schmorl's node deformity/mild compression superior endplate. Interval laminectomy.  Bulge.  L3-4:  Interval laminectomy.  Gas posterior aspect of the thecal sac.  This may represent expected postoperative changes.  Infection not excluded if of high clinical concern.  Evaluation of the thecal sac is limited without contrast.  Facet joint degenerative changes.  L4-5:  Recent fusion with pedicle screws and posterior connecting bar.  The L5 pedicle screws slightly breach the bony confines of the neural foramen greater on the left potentially irritating the exiting L5 nerve root greater on the left.  Minimal anterior slip of L4.  Limited evaluation of the thecal sac secondary lack of contrast.  Small spur contributes to mild bilateral foraminal narrowing.  L5-S1:  Broad-based disc/osteophyte with mild bilateral foraminal narrowing.  Moderate left-sided and mild to moderate right-sided lateral recess stenosis.  Mild spinal stenosis greater on the left.  S1-S2:  Rudimentary disc.  IMPRESSION: Previously  transitional vertebra was labeled S1.  Aneurysm of the abdominal aorta and iliac arteries.  Please see CT angiogram performed the same date. The right common iliac arteries dilated up to 1.9 cm.  T11-12:  Vacuum disc phenomenon with tiny left posterior lateral gas containing disc. Mild spinal stenosis.  Mild foraminal narrowing bilaterally.  L1-2:  Mild retrolisthesis L1.  Bulge and rotation.  Mild spinal stenosis.  Foraminal narrowing greater on the left.  L2-3:  Prior cement augmentation L2 with similar appearance of the Schmorl's node deformity/mild compression superior endplate. Interval laminectomy.  Bulge.  L3-4:  Interval laminectomy.  Gas posterior aspect of the thecal sac.  This may represent expected postoperative changes.  Infection not excluded if of high clinical concern.  Evaluation of the thecal sac is limited without contrast.  Facet joint degenerative changes.  L4-5:  Recent fusion with pedicle screws and posterior connecting bar.  The L5 pedicle screws slightly breach the bony confines of the neural foramen greater on the left potentially irritating the exiting L5 nerve root greater on the left.  Minimal anterior slip of L4.  Limited  evaluation of the thecal sac secondary lack of contrast.  Small spur contributes to mild bilateral foraminal narrowing.  L5-S1:  Broad-based disc/osteophyte with mild bilateral foraminal narrowing.  Moderate left-sided and mild to moderate right-sided lateral recess stenosis.  Mild spinal stenosis greater on the left.   Original Report Authenticated By: Lacy Duverney, M.D.   Ct Cta Abd/pel W/cm &/or W/o Cm  11/15/2012   *RADIOLOGY REPORT*  Clinical Data:  Abdominal and back pain.  Previous abdominal aortic aneurysm repair. Patient on Coumadin.  Evaluate for leak or rupture.  CT ANGIOGRAPHY ABDOMEN AND PELVIS  Technique:  Multidetector CT imaging of the abdomen and pelvis was performed using the standard protocol during bolus administration of intravenous contrast.   Multiplanar reconstructed images including MIPs were obtained and reviewed to evaluate the vascular anatomy.  Contrast: 80mL OMNIPAQUE IOHEXOL 350 MG/ML SOLN  Comparison:  03/25/2012  Findings:  Stable postop changes from previous abdominal aortic aneurysm repair noted.  No evidence of recurrent aneurysm or retroperitoneal hemorrhage.  No evidence of abdominal aortic or iliac dissection.  The surgical clips seen from prior cholecystectomy.  The liver, spleen, pancreas, adrenal glands, and kidneys are normal in appearance.  No evidence of hydronephrosis.  No soft tissue masses or lymphadenopathy identified within the abdomen or pelvis.  The patient has undergone prior cystoprostatectomy with ileal loop diversion.  No evidence of inflammatory process or abnormal fluid collections.  No evidence of dilated bowel loops or hernia.  No suspicious bone lesions identified.   Review of the MIP images confirms the above findings.  IMPRESSION:  1.  Prior abdominal aortic aneurysm repair.  No evidence of retroperitoneal hemorrhage, recurrent aneurysm, or dissection. 2.  No other acute findings within the abdomen or pelvis.  No evidence of recurrent metastatic carcinoma.   Original Report Authenticated By: Myles Rosenthal, M.D.    Review of Systems  All other systems reviewed and are negative.    Blood pressure 128/64, pulse 72, temperature 97.6 F (36.4 C), temperature source Oral, resp. rate 18, SpO2 95.00%. Physical Exam  Constitutional: He is oriented to person, place, and time. He appears well-developed and well-nourished.  HENT:  Head: Atraumatic.  Eyes: EOM are normal.  Cardiovascular: Intact distal pulses.   Respiratory: Effort normal.  Musculoskeletal:  Examination of the posterior lumbar incision shows it to be clean dry and intact. No surrounding erythema or drainage. Examination of bilateral lower extremities demonstrates intact sensation to light touch in L1-S1 nerve distribution with 5/5 strength in  bilateral quadriceps, hamstring, tibialis anterior, gastrocsoleus. He has normal rectal tone and sensation. I can palpate dorsal pedal pulses on both feet.  Neurological: He is alert and oriented to person, place, and time.  Skin: Skin is warm and dry.  Psychiatric: He has a normal mood and affect.     Assessment/Plan Uncontrolled pain bilateral lower extremities status post lumbar decompression and fusion CT scan of the lumbar spine is not concerning for postoperative complication and the decompression screws appear to be in good position. His clinical history and physical examination are also not concerning for cauda equina syndrome. His CT angiogram does not give any possible vascular cause of his pain. It is possible that he is simply having significantly more pain postoperatively because of his tolerance to opioid pain medications. We will get him admitted to the hospital and will try to get his pain under better control. We will try Dilaudid and will add an increased dose of gabapentin to try and affect the nerve  type pain. We will have physical therapy try and work with him while he is here. All this was discussed at the bedside with his daughter who is in agreement with this plan. I spoke with Dr. Yevette Edwards over the telephone about the patient who also viewed the CT scan and agrees with this plan.  Mable Paris 11/15/2012, 5:56 PM

## 2012-11-15 NOTE — ED Provider Notes (Signed)
History     CSN: [id number]  Arrival date & time 11/15/12  1409   First MD Initiated Contact with Patient 11/15/12 1416      Chief Complaint  Patient presents with  . Back Pain    (Consider location/radiation/quality/duration/timing/severity/associated sxs/prior treatment) HPI Comments: Patient from nursing home with one week history of intractable back pain. He complains of pain in his low back that radiates to his bilateral lower extremities. He denies any fever or vomiting. He is status post L2-3, L3-4, L4-5 laminectomy, bilateral partial facetectomy, bilateral foraminotomy 2. Posterior spinal fusion, L4-5 by DrDumonski. He states he's been unable to get out of bed for the past 5 days since he went to the nursing home. This pain is not controlled by the hydrocodone he is taking. He has a urostomy with no change in his urine output. He denies any fecal incontinence. Family states he had some incontinence at Dr. Marshell Levan office yesterday due to pain. He is unable to walk. He complains of weakness in his bilateral legs. He states the pain is more severe since the surgery. He was seen in the office yesterday by Dr. Yevette Edwards and was told that his incision is healing well.  The history is provided by the patient, the EMS personnel and a relative.    Past Medical History  Diagnosis Date  . Hyperlipidemia   . Orthostatic hypotension   . Bradycardia   . Syncope     s/p PTVP  . Myocardial infarction     1981  . CAD (coronary artery disease)     prior CABG in 2002 negative Myoview in 2009  . Hypertension   . Stroke     TIA  2007    . GERD (gastroesophageal reflux disease)   . Headache(784.0)   . Arthritis     OSTEOPOROSIS  . Aortic stenosis, mild     by 01/08/08 echo  . Chronic anticoagulation   . Depression   . AAA (abdominal aortic aneurysm)   . Heart attack   . Pacemaker     medtronic pacer  . History of cervical spinal surgery     over in [place]  [name] HOH (hard of hearing)      BILATERAL HEARING AIDS  . Prostate cancer   . Basal cell adenocarcinoma     'bottom lip" (11/06/2012)  . Bladder cancer   . Attention to urostomy     "since 2006" (11/06/2012)  . Atrial fibrillation     on amiodarone and has PTVP in place  . Exertional shortness of breath   . Chronic radicular pain of lower back   . Anxiety     Past Surgical History  Procedure Laterality Date  . Insert / replace / remove pacemaker      11/2009  DR BRODIE    . Prostatectomy      BLADDER ALSO REMOVED (OSTOMY BAG)  2006  FOR CANCER     . Abdominal aortic aneurysm repair  1999    STENT PLACED    . Cataract extraction w/ intraocular lens  implant, bilateral    . Appendectomy      1959  . Revision urostomy cutaneous    . Revision urostomy cutaneous  2006    Urostomy bag placed on right lower abdomen  . Vertebroplasty  08/22/2011    Procedure: VERTEBROPLASTY;  Surgeon: Carmela Hurt, MD;  Location: [facility];  Service: Neurosurgery;  Laterality: N/A;  Lumbar Two vertebroplasty  . Cholecystectomy  4 YRS AGO  . Posterior lumbar fusion  11/06/2012  . Coronary artery bypass graft  1999    CABG X2  . Skin cancer excision      "bottom lip" (11/06/2012)  . Bladder surgery      "for cancer" (11/06/2012)  . Lumbar laminectomy/decompression microdiscectomy Bilateral 11/06/2012    Procedure: LUMBAR LAMINECTOMY/DECOMPRESSION MICRODISCECTOMY;  Surgeon: Emilee Hero, MD;  Location: [facility] OR;  Service: Orthopedics;  Laterality: Bilateral;  Lumbar 2-5 decompression    Family History  Problem Relation Age of Onset  . Cancer Father     stomach  . Cancer Sister     lung    History  Substance Use Topics  . Smoking status: Former Smoker -- 3.00 packs/day for 50 years    Types: Cigarettes    Quit date: 06/04/1978  . Smokeless tobacco: Never Used  . Alcohol Use: No     Comment: rare "once a month"      Review of Systems  Constitutional: Positive for fatigue. Negative for fever, activity change  and appetite change.  HENT: Negative for congestion and rhinorrhea.   Respiratory: Negative for cough, chest tightness and shortness of breath.   Cardiovascular: Negative for chest pain.  Gastrointestinal: Negative for nausea, vomiting and abdominal pain.  Genitourinary: Negative for dysuria and hematuria.  Musculoskeletal: Positive for myalgias, back pain, arthralgias and gait problem.  Skin: Negative for rash.  Neurological: Positive for weakness. Negative for dizziness and headaches.  A complete 10 system review of systems was obtained and all systems are negative except as noted in the HPI and PMH.    Allergies  Codeine and Crestor  Home Medications   Current Outpatient Rx  Name  Route  Sig  Dispense  Refill  . diazepam (VALIUM) 5 MG tablet   Oral   Take 5 mg by mouth every 6 (six) hours as needed (spasms).         [name] escitalopram (LEXAPRO) 20 MG tablet   Oral   Take 20 mg by mouth daily.          [name] gabapentin (NEURONTIN) 100 MG capsule   Oral   Take 100 mg by mouth at bedtime.         [name] HYDROcodone-acetaminophen (NORCO/VICODIN) 5-325 MG per tablet   Oral   Take 1 tablet by mouth every 6 (six) hours as needed for pain.         [name] lidocaine (LIDODERM) 5 %   Transdermal   Place 1 patch onto the skin daily as needed (for pain). Remove & Discard patch within 12 hours or as directed by MD         . metoprolol succinate (TOPROL-XL) 25 MG 24 hr tablet   Oral   Take 25 mg by mouth daily.         . nitroGLYCERIN (NITROSTAT) 0.4 MG SL tablet   Sublingual   Place 1 tablet (0.4 mg total) under the tongue every 5 (five) minutes as needed for chest pain.   25 tablet   prn   . polyethylene glycol (MIRALAX / GLYCOLAX) packet   Oral   Take 17 g by mouth daily as needed (for constipation).          . simvastatin (ZOCOR) 10 MG tablet   Oral   Take 10 mg by mouth at bedtime.         [name] tiotropium (SPIRIVA) 18 MCG inhalation capsule   Inhalation   Place 18 mcg  into  inhaler and inhale daily as needed (for shortness of breath).         . warfarin (COUMADIN) 6 MG tablet   Oral   Take 6 mg by mouth daily.         [name] zolpidem (AMBIEN) 10 MG tablet   Oral   Take 10 mg by mouth at bedtime.            BP 128/64  Pulse 72  Temp(Src) 97.6 F (36.4 C) (Oral)  Resp 18  SpO2 95%  Physical Exam  Constitutional: He is oriented to person, place, and time. He appears well-developed and well-nourished. He appears distressed.  HENT:  Head: Normocephalic.  Mouth/Throat: Oropharynx is clear and moist. No oropharyngeal exudate.  Eyes: Conjunctivae and EOM are normal. Pupils are equal, round, and reactive to light.  Neck: Normal range of motion. Neck supple.  Cardiovascular: Normal rate, regular rhythm and normal heart sounds.   No murmur heard. Pulmonary/Chest: Effort normal and breath sounds normal. No respiratory distress.  Abdominal: Soft. There is no tenderness. There is no rebound and no guarding.  Urostomy in place  Genitourinary: Rectum normal.  Normal rectal tone  Musculoskeletal: Normal range of motion. He exhibits tenderness.  steristrips in place to lumbar spine. No hematoma or erythema.  Neurological: He is alert and oriented to person, place, and time. No cranial nerve deficit. He exhibits normal muscle tone. Coordination normal.  Ankle flexion and extension intact. +2 DP and PT pulses. +2 femoral pulses.  Unable to hold legs off bed.  Weak hip flexion, extension  Skin: Skin is warm.    ED Course  Procedures (including critical care time)  Labs Reviewed  CBC WITH DIFFERENTIAL - Abnormal; Notable for the following:    RBC 3.57 (*)    Hemoglobin 10.9 (*)    HCT 32.2 (*)    Platelets 412 (*)    Monocytes Absolute 1.2 (*)    All other components within normal limits  COMPREHENSIVE METABOLIC PANEL - Abnormal; Notable for the following:    Glucose, Bld 135 (*)    Albumin 3.0 (*)    Total Bilirubin 0.2 (*)    GFR calc non Af  Amer 77 (*)    GFR calc Af Amer 89 (*)    All other components within normal limits  PROTIME-INR - Abnormal; Notable for the following:    Prothrombin Time 27.2 (*)    INR 2.68 (*)    All other components within normal limits  URINALYSIS, ROUTINE W REFLEX MICROSCOPIC   No results found.   No diagnosis found.    MDM  Worsening back pain after laminectomy and lumbar fusion on June 5. Patient states he is not able to walk since leaving the hospital June 9. No fevers or vomiting. No bleeding or drainage from the wound.  He has symmetric weakness in his lower extremities and is unable to lift either leg off the bed. Ankle flexion and extension are intact. Great extension intact bilaterally. Intact distal pulses.  Discussed with Dr. Ave Filter and Dr. Yevette Edwards. They agree with CT imaging of the lumbar spine to assess for postoperative fluid collection. Patient has a pacemaker is unable to get an MRI.  Patient's pain is treated in the ED. CT results pending and Dr. Ave Filter consult pending at time of sign out to Dr. Ethelda Chick.       Glynn Octave, MD 11/15/12 [phone]

## 2012-11-15 NOTE — Progress Notes (Signed)
PHARMACIST - PHYSICIAN COMMUNICATION DR:  Yevette Edwards CONCERNING: Pharmacy Care Issues Regarding Warfarin Labs  RECOMMENDATION (Action Taken): A baseline and daily protime for three days has been ordered to meet the [facility] Patient safety goal and comply with the current [facility] Pharmacy & Therapeutics Committee policy.   The Pharmacy will defer all warfarin dose order changes and follow up of lab results to the prescriber unless an additional order to initiate a "pharmacy Coumadin consult" is placed.  DESCRIPTION:  While hospitalized, to be in compliance with The [employer] Commission National Patient Safety Goals, all patients on warfarin must have a baseline and/or current protime prior to the administration of warfarin. Pharmacy has received your order for warfarin without these required laboratory assessments.  Sheppard Coil PharmD., BCPS Clinical Pharmacist Pager [phone] 11/15/2012 7:16 PM

## 2012-11-15 NOTE — ED Notes (Signed)
Pt presents to department via [facility] from [facility] for evaluation of increased back pain. States recent spinal fusion (L4-L5) x1 week ago, no relief with pain medications at home. 10/10 pain at the time. He is alert and oriented x4.

## 2012-11-15 NOTE — ED Notes (Signed)
Surgery at the bedside to consult.  

## 2012-11-16 DIAGNOSIS — M79609 Pain in unspecified limb: Secondary | ICD-10-CM

## 2012-11-16 LAB — PROTIME-INR: INR: 2.95 — ABNORMAL HIGH (ref 0.00–1.49)

## 2012-11-16 MED ORDER — WARFARIN SODIUM 5 MG PO TABS
5.0000 mg | ORAL_TABLET | Freq: Once | ORAL | Status: AC
  Administered 2012-11-16: 5 mg via ORAL
  Filled 2012-11-16 (×2): qty 1

## 2012-11-16 MED ORDER — WARFARIN - PHARMACIST DOSING INPATIENT: Freq: Every day | Status: DC

## 2012-11-16 MED ORDER — DEXAMETHASONE SODIUM PHOSPHATE 4 MG/ML IJ SOLN
8.0000 mg | Freq: Four times a day (QID) | INTRAMUSCULAR | Status: DC
  Administered 2012-11-17: 8 mg via INTRAVENOUS
  Filled 2012-11-16 (×3): qty 2

## 2012-11-16 MED ORDER — WARFARIN SODIUM 6 MG PO TABS
6.0000 mg | ORAL_TABLET | Freq: Once | ORAL | Status: DC
  Filled 2012-11-16: qty 1

## 2012-11-16 NOTE — Progress Notes (Addendum)
ANTICOAGULATION CONSULT NOTE - Initial Consult  Pharmacy Consult for Warfarin Indication: atrial fibrillation  Allergies  Allergen Reactions  . Codeine Nausea And Vomiting  . Crestor (Rosuvastatin Calcium) Other (See Comments)    Leg cramps   Vital Signs: Temp: 97.7 F (36.5 C) (06/15 0632) Temp src: Oral (06/15 0632) BP: 100/61 mmHg (06/15 0632) Pulse Rate: 63 (06/15 0632)  Labs:  Recent Labs  11/15/12 1440 11/16/12 0610  HGB 10.9*  --   HCT 32.2*  --   PLT 412*  --   LABPROT 27.2* 29.2*  INR 2.68* 2.95*  CREATININE 0.90  --     The CrCl is unknown because both a height and weight (above a minimum accepted value) are required for this calculation.   Medical History: Past Medical History  Diagnosis Date  . Hyperlipidemia   . Orthostatic hypotension   . Bradycardia   . Syncope     s/p PTVP  . Myocardial infarction     1981  . CAD (coronary artery disease)     prior CABG in 2002 negative Myoview in 2009  . Hypertension   . Stroke     TIA  2007    . GERD (gastroesophageal reflux disease)   . Headache(784.0)   . Arthritis     OSTEOPOROSIS  . Aortic stenosis, mild     by 01/08/08 echo  . Chronic anticoagulation   . Depression   . AAA (abdominal aortic aneurysm)   . Heart attack   . Pacemaker     medtronic pacer  . History of cervical spinal surgery     [place]  [name] HOH (hard of hearing)     BILATERAL HEARING AIDS  . Prostate cancer   . Basal cell adenocarcinoma     'bottom lip" (11/06/2012)  . Bladder cancer   . Attention to urostomy     "since 2006" (11/06/2012)  . Atrial fibrillation     on amiodarone and has PTVP in place  . Exertional shortness of breath   . Chronic radicular pain of lower back   . Anxiety     Medications:  PTA Warfarin Dose: 6mg /day  Assessment: 79 y/o M on chronic warfarin for A-fib. Presented with bilateral LE pain after decompression and fusion ~ 10 days ago. INR on admit was 2.68, INR today is 2.95 after warfarin  6 mg dosed per MD. H/H 10.9/32.2. Scr 0.90. No overt bleeding noted.  Goal of Therapy:  INR 2-3 Monitor platelets by anticoagulation protocol: Yes   Plan:  -Will give 5mg  x 1 tonight at 1800 -Daily PT/INR -Monitor for bleeding  Sheppard Coil PharmD., BCPS Clinical Pharmacist Pager [phone] 11/16/2012 12:12 PM

## 2012-11-16 NOTE — Progress Notes (Signed)
PATIENT ID: Peter Richards        Subjective: Pain is somewhat improved with Dilaudid today.    Objective:  Filed Vitals:   11/16/12 0632  BP: 100/61  Pulse: 63  Temp: 97.7 F (36.5 C)  Resp: 15    Resting in bed, appears comfortable. Bilateral lower extremities neurovascularly intact.  Labs:   Recent Labs  11/15/12 1440  HGB 10.9*   Recent Labs  11/15/12 1440  WBC 10.0  RBC 3.57*  HCT 32.2*  PLT 412*   Recent Labs  11/15/12 1440  NA 137  K 4.6  CL 103  CO2 24  BUN 19  CREATININE 0.90  GLUCOSE 135*  CALCIUM 9.1    Assessment and Plan: Lower extremity pain improving with increased pain regimen. Exam and radiology studies reassuring. Continued pain management and physical therapy. Disposition pending.  VTE proph: Coumadin and SCDs

## 2012-11-16 NOTE — Progress Notes (Signed)
VASCULAR LAB PRELIMINARY  PRELIMINARY  PRELIMINARY  PRELIMINARY  Bilateral lower extremity venous Dopplers completed.    Preliminary report:  There is no DVT or SVT noted in the bilateral lower extremities.   Tirzah Fross, RVT 11/16/2012, 5:27 PM

## 2012-11-16 NOTE — Evaluation (Signed)
Physical Therapy Evaluation Patient Details Name: Peter Richards MRN: [id number] DOB: [birth year] Today's Date: 11/16/2012 Time: 7829-5621 PT Time Calculation (min): 21 min  PT Assessment / Plan / Recommendation Clinical Impression  Pt is s/p lumbar decompressio and fusion, now readmitted from SNF (where he was receiving rehab) with intractable back pain; Presents to PT with decr functional mobility; Will benefit from PT to maximize functional mobility, and to facilitate dc planning    PT Assessment  Patient needs continued PT services    Follow Up Recommendations  SNF;Supervision/Assistance - 24 hour;Supervision for mobility/OOB    Does the patient have the potential to tolerate intense rehabilitation      Barriers to Discharge None      Equipment Recommendations  Rolling walker with 5" wheels    Recommendations for Other Services OT consult   Frequency Min 5X/week    Precautions / Restrictions Precautions Precautions: Fall;Back Precaution Booklet Issued: Yes (comment) Precaution Comments: reinforced back prec Required Braces or Orthoses: Spinal Brace Spinal Brace: Thoracolumbosacral orthotic;Applied in sitting position Restrictions Weight Bearing Restrictions: No   Pertinent Vitals/Pain Back pain, did not rate; Repositioned as comfortably as possible in chair      Mobility  Bed Mobility Bed Mobility: Right Sidelying to Sit;Rolling Right;Sitting - Scoot to Edge of Bed Rolling Right: 4: Min assist;With rail Right Sidelying to Sit: HOB flat;3: Mod assist Sitting - Scoot to Edge of Bed: 3: Mod assist Details for Bed Mobility Assistance: Cues for logroll technique, and close guard for back prec Transfers Transfers: Sit to Stand;Stand to Sit Sit to Stand: 3: Mod assist;From bed;With upper extremity assist Stand to Sit: To chair/3-in-1;4: Min assist Details for Transfer Assistance: Pt needs mod instructional cueing for hand placement with sit to  stand. Ambulation/Gait Ambulation/Gait Assistance: 4: Min assist Ambulation Distance (Feet): 6 Feet Assistive device: Rolling walker Ambulation/Gait Assistance Details: Cues for posture, and RW proximity; Overall moving well Gait Pattern: Step-through pattern;Decreased stride length;Decreased step length - right;Decreased step length - left;Shuffle;Trunk flexed;Narrow base of support Gait velocity: slowed    Exercises     PT Diagnosis: Difficulty walking;Acute pain;Abnormality of gait  PT Problem List: Decreased activity tolerance;Decreased balance;Decreased mobility;Decreased knowledge of use of DME;Decreased safety awareness;Decreased knowledge of precautions;Pain PT Treatment Interventions: DME instruction;Gait training;Functional mobility training;Therapeutic activities;Balance training;Patient/family education   PT Goals Acute Rehab PT Goals PT Goal Formulation: With patient/family Time For Goal Achievement: 11/30/12 Potential to Achieve Goals: Good Pt will go Supine/Side to Sit: with supervision PT Goal: Supine/Side to Sit - Progress: Goal set today Pt will go Sit to Supine/Side: with supervision PT Goal: Sit to Supine/Side - Progress: Goal set today Pt will go Sit to Stand: with supervision PT Goal: Sit to Stand - Progress: Goal set today Pt will go Stand to Sit: with supervision PT Goal: Stand to Sit - Progress: Goal set today Pt will Transfer Bed to Chair/Chair to Bed: with supervision PT Transfer Goal: Bed to Chair/Chair to Bed - Progress: Goal set today Pt will Ambulate: 51 - 150 feet;with supervision;with rolling walker PT Goal: Ambulate - Progress: Goal set today Additional Goals Additional Goal #1: pt. will state and comply with 3/3 back precautions and log rolling technique PT Goal: Additional Goal #1 - Progress: Goal set today  Visit Information  Last PT Received On: 11/16/12 Assistance Needed: +2    Subjective Data  Subjective: Agreeable to try to  walk Patient Stated Goal: to rehab then home at [facility] (per wife)   Prior Functioning  Home Living Lives With: Spouse Available Help at Discharge: Skilled Nursing Facility;Other (Comment) (lives at [facility]) Type of Home: House Home Access: Level entry Home Layout: One level Home Adaptive Equipment: Straight cane Additional Comments: Was having rehab at SNF portion of [facility] when pain increased to the point of coming back to hospital Prior Function Level of Independence: Independent with assistive device(s) (before this recent surgery and then incr back pain) Driving: Yes Vocation: Retired Musician: No difficulties Dominant Hand: Right    Cognition  Cognition Arousal/Alertness: Awake/alert Behavior During Therapy: WFL for tasks assessed/performed Overall Cognitive Status: Within Functional Limits for tasks assessed Area of Impairment: Attention Current Attention Level: Sustained (Internally distracted by pain)    Extremity/Trunk Assessment Right Upper Extremity Assessment RUE ROM/Strength/Tone: WFL for tasks assessed RUE Sensation: WFL - Light Touch RUE Coordination: WFL - gross/fine motor Left Upper Extremity Assessment LUE ROM/Strength/Tone: WFL for tasks assessed LUE Sensation: WFL - Light Touch LUE Coordination: WFL - gross/fine motor Right Lower Extremity Assessment RLE ROM/Strength/Tone: Deficits RLE ROM/Strength/Tone Deficits: Grossly decr strength, limited by pain Left Lower Extremity Assessment LLE ROM/Strength/Tone: Deficits LLE ROM/Strength/Tone Deficits: Grossly decr strength Trunk Assessment Trunk Assessment: Kyphotic;Other exceptions Trunk Exceptions: cervical flexion and protraction present   Balance Static Sitting Balance Static Sitting - Balance Support: Right upper extremity supported;Left upper extremity supported Static Sitting - Level of Assistance: 5: Stand by assistance Static Sitting - Comment/# of Minutes:  3 Static Standing Balance Static Standing - Balance Support: Right upper extremity supported;Left upper extremity supported Static Standing - Level of Assistance:  (40%)  End of Session PT - End of Session Equipment Utilized During Treatment: Gait belt;Back brace Activity Tolerance: Patient tolerated treatment well Patient left: in chair;with call bell/phone within reach;with family/visitor present Nurse Communication: Mobility status  GP     Olen Pel Bude, [place] [phone]  11/16/2012, 5:08 PM

## 2012-11-16 NOTE — Progress Notes (Signed)
Just spoke with patient. Today is a better day than yesterday, but he continues to c/o constant pain b/l in the posterior thighs and posterior legs. He did again confirm that his pain was improved immediately after surgery, but that his pain has been increasing the last few days, and is now constant. Pain is not explained by his CT scan, which does show adequate decompression of his spinal canal. CT angio is also negative. Will obtain EMG/NCS and will also obtain duplex ultrasound to r/o DVT.

## 2012-11-17 ENCOUNTER — Encounter: Payer: Medicare Other | Admitting: *Deleted

## 2012-11-17 LAB — PROTIME-INR
INR: 3.65 — ABNORMAL HIGH (ref 0.00–1.49)
Prothrombin Time: 34.2 seconds — ABNORMAL HIGH (ref 11.6–15.2)

## 2012-11-17 MED ORDER — DEXAMETHASONE SODIUM PHOSPHATE 10 MG/ML IJ SOLN
8.0000 mg | Freq: Four times a day (QID) | INTRAMUSCULAR | Status: AC
  Administered 2012-11-17 (×3): 8 mg via INTRAVENOUS
  Filled 2012-11-17 (×3): qty 0.8

## 2012-11-17 NOTE — Progress Notes (Signed)
ANTICOAGULATION CONSULT NOTE - Follow Up Consult  Pharmacy Consult for Coumadin Indication: atrial fibrillation  Allergies  Allergen Reactions  . Codeine Nausea And Vomiting  . Crestor (Rosuvastatin Calcium) Other (See Comments)    Leg cramps    Vital Signs: Temp: 97.7 F (36.5 C) (06/16 0625) Temp src: Oral (06/16 0625) BP: 117/59 mmHg (06/16 0625) Pulse Rate: 60 (06/16 0625)  Labs:  Recent Labs  11/15/12 1440 11/16/12 0610 11/17/12 0520  HGB 10.9*  --   --   HCT 32.2*  --   --   PLT 412*  --   --   LABPROT 27.2* 29.2* 34.2*  INR 2.68* 2.95* 3.65*  CREATININE 0.90  --   --     The CrCl is unknown because both a height and weight (above a minimum accepted value) are required for this calculation.  Assessment: 84yom on coumadin pta for afib. Resumed on admission by MD then changed to per pharmacy on 6/15. INR was therapeutic on admission and has now increased to above goal. No bleeding reported.  Home dose = 6mg  daily  Goal of Therapy:  INR 2-3 Monitor platelets by anticoagulation protocol: Yes   Plan:  1) No coumadin tonight 2) Follow up INR in AM  Fredrik Rigger 11/17/2012,12:53 PM

## 2012-11-17 NOTE — Progress Notes (Signed)
Patient son spoke to RN in regards to fear patient may be having suicidal thoughts. Family stepped out of room and patient was asked if he has had any thoughts of harming himself or others. He answered no not right now. Patient Alert and oriented times four. He mentioned some past thoughts due to the excruciating pain he was in from his back operation and denied any active thoughts. He agreed to speak to someone about his past thoughts in order to get help and keep the thoughts from returning. Chaplin spoke to patient today and patient PA-C called and psych consult entered. Will continue to closely monitor patient

## 2012-11-17 NOTE — Clinical Social Work Psychosocial (Signed)
Clinical Social Work Department  BRIEF PSYCHOSOCIAL ASSESSMENT  Patient: Peter Richards  Account Number: [id number]  Admit date: 11/15/12 Clinical Social Worker Sabino Niemann, MSW Date/Time:  Referred by: Physician Date Referred: 11/17/2012 Referred for   SNF Placement   Other Referral:  Interview type: Patient  Other interview type: PSYCHOSOCIAL DATA  Living Status: Spouse Admitted from facility: [name] Level of care: SNF Primary support name: [facility] Primary support relationship to patient: Spouse Degree of support available:  Strong and vested  CURRENT CONCERNS  Current Concerns   Post-Acute Placement-returning to [facility]  Other Concerns:  SOCIAL WORK ASSESSMENT / PLAN  CSW met with pt re: PT recommendation is to return to SNF.   Pt lives with with his spouse and is from [facility]  Patient is agreeable to returning to [facility] when he is medically stable        Assessment/plan status: Information/Referral to [facility]  Other assessment/ plan:  Information/referral to community resources:  SNF   PTAR  PATIENT'S/FAMILY'S RESPONSE TO PLAN OF CARE:  Pt  reports he is agreeable to returning to SNF when he is medically stable.  Pt verbalized  appreciation for CSW assist.   Sabino Niemann, MSW [phone]

## 2012-11-17 NOTE — Progress Notes (Signed)
I have been in communication with [name] floor nurse [place] today. It appears that Mr Peter Richards has expressed suicidal ideations to his family members in the hospital and they are quite concerned as this is not typical of him. We therefore consulted behavioral health to perform a psychological evaluation and consult. It appears that this will be performed by Dr. Delora Fuel sometime tomorrow. The plan will be to continue his PT and maintain him here at the hospital until he undergoes this evaluation. Per the PT notes he is making appropriate progress in PT. He continues to request additional pain medication from the nursing staff regularly but yet he continues to appear quite comfortable. We are concerned regarding his mental status and are eager for behavioral health's input.

## 2012-11-17 NOTE — Progress Notes (Signed)
I just spoke with patient. He sounds great. Leg pain is now 50% improved, even with ambulation. He sounds much better and much more optimistic. Current plan is to continue to observe and ensure he continues to get resolution of pain. I was informed that he had previously suggested suicidal ideation. I informed him that he's great guy and I don't want anything bad to happen to him, so I have arranged for psychiatry to evaluate him tomorrow in the morning and he is in agreement with this plan. If all goes well, will plan to d/c back to SNF on Wednesday.

## 2012-11-17 NOTE — Progress Notes (Signed)
When I arrived, pt's wife and son were bedside. Pt's eyes were closed, but opened when he heard my voice. Pt's family excused themselves as pt and I met alone.  Pt expressed wanting to commit suicide.  He said he wanted to walk out in front of a truck or shoot himself.  He said he can no longer take the pain he is in and he does not want to be a problem to his family. He mentioned hallucinations with meds.  He talked about his faith and past history of being involved at church.  I encouraged him to talk with his dr about the hallucinations and his feelings.  We talked about how life may look for his wife and son if he committed suicide.  He admitted she would be devastated and would not make it long if he did that. We talked about other alternatives, such as talking with his dr about the pain and getting help for his pain.  Immediately after mtg w/pt son said pt told him he wanted to commit suicide and asked for a gun. I strongly encouraged son to talk w/pt's nurse immediately.  Son asked if meds could cause him to have those thoughts and I told him I could not give him that medical advice.  Had prayer w/pt which seemed very meaningful. After my prayer he continued to pray. Pt said he felt better and thanked me for the visit.  Marjory Lies Chaplain  11/17/12 1100  Clinical Encounter Type  Visited With Patient;Family;Patient and family together

## 2012-11-17 NOTE — Progress Notes (Signed)
UR COMPLETED  

## 2012-11-17 NOTE — Progress Notes (Deleted)
I just spoke with patient. He sounds great. Leg pain is now 50% improved, even with ambulation. He sounds much better and much more optimistic. Current plan is to continue to observe and ensure he continues to get resolution of pain. I did report suggesting suicidal ideation previously. I informed him that he's great guy and I don't want anything bad to happen to him, so I have arranged for psychiatry to evaluate him tomorrow in the morning and he is in agreement of this plan. If all goes well, will plan to d/c back to SNF on Wednesday.

## 2012-11-17 NOTE — Progress Notes (Signed)
Physical Therapy Treatment Patient Details Name: Peter Richards MRN: [id number] DOB: 06/12/1928 Today's Date: 11/17/2012 Time: 4098-1191 PT Time Calculation (min): 24 min  PT Assessment / Plan / Recommendation Comments on Treatment Session  Pt progressing with therapy this afternoon. Seemed to be in higher spirits than he was this morning. Will cont to f/u with pt to maximize functional mobility while in acute setting.     Follow Up Recommendations  SNF;Supervision/Assistance - 24 hour;Supervision for mobility/OOB     Does the patient have the potential to tolerate intense rehabilitation     Barriers to Discharge        Equipment Recommendations  None recommended by PT    Recommendations for Other Services    Frequency Min 5X/week   Plan Discharge plan remains appropriate;Frequency remains appropriate    Precautions / Restrictions Precautions Precautions: Fall;Back Precaution Comments: pt verbalized 1/3 back precautions independently Required Braces or Orthoses: Spinal Brace Spinal Brace: Thoracolumbosacral orthotic;Applied in sitting position Spinal Brace Comments: leg component discontinued today by MD Restrictions Weight Bearing Restrictions: No   Pertinent Vitals/Pain 8/10; pt premedicated with dilaudid per RN prior to treatment.     Mobility  Bed Mobility Bed Mobility: Not assessed (pt sitting up in recliner) Transfers Transfers: Sit to Stand;Stand to Sit Sit to Stand: 4: Min assist;From chair/3-in-1;With armrests;With upper extremity assist Stand to Sit: To chair/3-in-1;With upper extremity assist;With armrests;4: Min assist Details for Transfer Assistance: pt demo decreased safety awareness with transfers; required max cues for hand placement and safety with RW; pt required increased time to complete tasks due to pain Ambulation/Gait Ambulation/Gait Assistance: 4: Min assist;3: Mod assist Ambulation Distance (Feet): 40 Feet Assistive device: Rolling  walker Ambulation/Gait Assistance Details: (A) to manage/advance RW at times; verbal cues for upright posture and to increase BOS to increase stability; pt became unsteady with fatigue and required mod (A) to steady  Gait Pattern: Step-through pattern;Decreased stride length;Decreased step length - right;Decreased step length - left;Shuffle;Trunk flexed;Narrow base of support Gait velocity: slowed Stairs: No Wheelchair Mobility Wheelchair Mobility: No    Exercises     PT Diagnosis:    PT Problem List:   PT Treatment Interventions:     PT Goals Acute Rehab PT Goals PT Goal Formulation: With patient/family Time For Goal Achievement: 11/30/12 Potential to Achieve Goals: Good PT Goal: Sit to Stand - Progress: Progressing toward goal PT Goal: Stand to Sit - Progress: Progressing toward goal PT Transfer Goal: Bed to Chair/Chair to Bed - Progress: Progressing toward goal PT Goal: Ambulate - Progress: Progressing toward goal Additional Goals PT Goal: Additional Goal #1 - Progress: Progressing toward goal  Visit Information  Last PT Received On: 11/17/12 Assistance Needed: +1    Subjective Data  Subjective: pt sitting in chair; agreeable to walk. wife and son present   Patient Stated Goal: none stated    Cognition  Cognition Arousal/Alertness: Awake/alert Behavior During Therapy: WFL for tasks assessed/performed Overall Cognitive Status: Impaired/Different from baseline Area of Impairment: Following commands;Problem solving Following Commands: Follows one step commands consistently (with increased time ) Problem Solving: Slow processing;Decreased initiation;Difficulty sequencing;Requires verbal cues;Requires tactile cues General Comments: Delayed processing time with simple one step commands    Balance  Balance Balance Assessed: Yes Static Sitting Balance Static Sitting - Balance Support: Bilateral upper extremity supported;Feet supported Static Sitting - Level of Assistance:  5: Stand by assistance Static Sitting - Comment/# of Minutes: tolerated static sitting ~4 min to donn TLSO  End of Session PT - End  of Session Equipment Utilized During Treatment: Gait belt;Back brace Activity Tolerance: Patient tolerated treatment well Patient left: in chair;with call bell/phone within reach;with family/visitor present Nurse Communication: Mobility status   GP     Donell Sievert, [place] [phone] 11/17/2012, 1:45 PM

## 2012-11-17 NOTE — Progress Notes (Signed)
Mr Natter is now 11 days PO L2-L5 decompression and Posterior L4-5 fusion. He initially did well and had resolution of his leg pain but unfortunately he had recurrence of BIL posterior buttock and thigh pain and was re-admitted on the 14th. He states that his BIL leg pain is much better controlled today and stable from yesterday. He has not yet had any PT since being re-admitted. He continues to state his pain is equal in his BIL LE's post thigh and calf. His BIL LE doppler was negative yesterday and his CT scan demonstrated an adequate decompression and no hematoma. He is still awaiting his EMG/NCS.   BP 117/59  Pulse 60  Temp(Src) 97.7 F (36.5 C) (Oral)  Resp 18  SpO2 95% Pt laying in his hospital bed comfortably, appears comfortable, able to move BIL LE's, alert and oriented X3. -Homans BIL, 2+ DPP=BIL.  A/P  -11 days PO L2-L5 decompression/posterior L4-5 fusion   -Initial resolution of leg px, return of BIL leg px approx 1 week ago   -Awaiting results of EMG/NCS  -Pain currently well controlled on dilaudid, cont current regimen  -Cont warfarin per pharmacy   -Out of bed and work on ambulation with PT/OT   -D/C to SNF likely tomorrow if pain continues to be well controlled and EMG/NCS obtained

## 2012-11-18 DIAGNOSIS — M549 Dorsalgia, unspecified: Secondary | ICD-10-CM

## 2012-11-18 MED ORDER — DULOXETINE HCL 30 MG PO CPEP
30.0000 mg | ORAL_CAPSULE | Freq: Every day | ORAL | Status: DC
  Administered 2012-11-18 – 2012-11-19 (×2): 30 mg via ORAL
  Filled 2012-11-18 (×2): qty 1

## 2012-11-18 MED ORDER — GABAPENTIN 300 MG PO CAPS
300.0000 mg | ORAL_CAPSULE | Freq: Three times a day (TID) | ORAL | Status: DC
  Administered 2012-11-18 – 2012-11-19 (×2): 300 mg via ORAL
  Filled 2012-11-18 (×4): qty 1

## 2012-11-18 MED ORDER — WARFARIN SODIUM 3 MG PO TABS
3.0000 mg | ORAL_TABLET | Freq: Once | ORAL | Status: AC
  Administered 2012-11-18: 3 mg via ORAL
  Filled 2012-11-18: qty 1

## 2012-11-18 NOTE — Consult Note (Signed)
Reason for Consult: suicidal ideations Referring Physician: Emilee Hero, MD    Peter Richards is an 79 y.o. male.  HPI: Patient was seen and chart reviewed. Patient has been suffering with multiple medical problems and recently underwent a lumbar decompression and fusion about 9 days ago. Reportedly patient has increased pain in his back in his legs. Patient started feeling depressed, sad, hopeless, helpless and worthless to himself and his family members. Patient stated he does not want to continue living another 10 years as his stated to his wife and sister. Reportedly his wife was upset about depression and passive suicidal thoughts. Patient stated that he work with the physical therapist and able to walk somewhat yesterday but he would not do today. He has been taking antidepressant medication Lexapro several years and agree to change to other and at the present medication Cymbalta. Patient does not want medication changes delay his discharge plan. Patient wants to go back to "[employer]", independant living apartment with his wife and continue care and rehab for his legs. He may needs to relocate to ALF soon. Patient stated that he wanted to work hard to get back to and see his grandchildren growing up.  Patient has no history of for acute psychiatric hospitalization or outpatient psychiatric services. Patient is to medication from the primary care physician   Mental Status Examination: Patient is lying in bed, calm, cooperative and maintaining good eye contact. Patient has depressed mood and his affect was constricted/flat. He has normal rate, Monotonus and normal volume of speech. His thought process is linear and goal directed. Patient has denied suicidal, homicidal ideations, intentions or plans. Patient has no evidence of auditory or visual hallucinations, delusions, and paranoia. Patient has fair insight judgment and impulse control.  Past Medical History  Diagnosis Date  .  Hyperlipidemia   . Orthostatic hypotension   . Bradycardia   . Syncope     s/p PTVP  . Myocardial infarction     1981  . CAD (coronary artery disease)     prior CABG in 2002 negative Myoview in 2009  . Hypertension   . Stroke     TIA  2007    . GERD (gastroesophageal reflux disease)   . Headache(784.0)   . Arthritis     OSTEOPOROSIS  . Aortic stenosis, mild     by 01/08/08 echo  . Chronic anticoagulation   . Depression   . AAA (abdominal aortic aneurysm)   . Heart attack   . Pacemaker     medtronic pacer  . History of cervical spinal surgery     [place]  [name] HOH (hard of hearing)     BILATERAL HEARING AIDS  . Prostate cancer   . Basal cell adenocarcinoma     'bottom lip" (11/06/2012)  . Bladder cancer   . Attention to urostomy     "since 2006" (11/06/2012)  . Atrial fibrillation     on amiodarone and has PTVP in place  . Exertional shortness of breath   . Chronic radicular pain of lower back   . Anxiety     Past Surgical History  Procedure Laterality Date  . Insert / replace / remove pacemaker      11/2009  DR BRODIE    . Prostatectomy      BLADDER ALSO REMOVED (OSTOMY BAG)  2006  FOR CANCER     . Abdominal aortic aneurysm repair  1999    STENT PLACED    .  Cataract extraction w/ intraocular lens  implant, bilateral    . Appendectomy      1959  . Revision urostomy cutaneous    . Revision urostomy cutaneous  2006    Urostomy bag placed on right lower abdomen  . Vertebroplasty  08/22/2011    Procedure: VERTEBROPLASTY;  Surgeon: Carmela Hurt, MD;  Location: [facility];  Service: Neurosurgery;  Laterality: N/A;  Lumbar Two vertebroplasty  . Cholecystectomy      4 YRS AGO  . Posterior lumbar fusion  11/06/2012  . Coronary artery bypass graft  1999    CABG X2  . Skin cancer excision      "bottom lip" (11/06/2012)  . Bladder surgery      "for cancer" (11/06/2012)  . Lumbar laminectomy/decompression microdiscectomy Bilateral 11/06/2012    Procedure: LUMBAR  LAMINECTOMY/DECOMPRESSION MICRODISCECTOMY;  Surgeon: Emilee Hero, MD;  Location: [facility] OR;  Service: Orthopedics;  Laterality: Bilateral;  Lumbar 2-5 decompression    Family History  Problem Relation Age of Onset  . Cancer Father     stomach  . Cancer Sister     lung    Social History:  reports that he quit smoking about 34 years ago. His smoking use included Cigarettes. He has a 150 pack-year smoking history. He has never used smokeless tobacco. He reports that he does not drink alcohol or use illicit drugs.  Allergies:  Allergies  Allergen Reactions  . Codeine Nausea And Vomiting  . Crestor (Rosuvastatin Calcium) Other (See Comments)    Leg cramps    Medications: I have reviewed the patient's current medications.  Results for orders placed during the hospital encounter of 11/15/12 (from the past 48 hour(s))  PROTIME-INR     Status: Abnormal   Collection Time    11/17/12  5:20 AM      Result Value Range   Prothrombin Time 34.2 (*) 11.6 - 15.2 seconds   INR 3.65 (*) 0.00 - 1.49  PROTIME-INR     Status: Abnormal   Collection Time    11/18/12  5:50 AM      Result Value Range   Prothrombin Time 29.0 (*) 11.6 - 15.2 seconds   INR 2.92 (*) 0.00 - 1.49    No results found.  Positive for anxiety, bad mood, depression and Multiple medical problems and chronic pain Blood pressure 114/51, pulse 63, temperature 97.7 F (36.5 C), temperature source Oral, resp. rate 16, height 5\' 8"  (1.727 m), weight 83.28 kg (183 lb 9.6 oz), SpO2 100.00%.   Assessment/Plan: Depression disorder R/O Major depressive disorder  Recommendation:  1. Patient does not meet criteria for acute psychiatric hospitalization 2. Discontinue Lexapro - not helping 3. Start Cymbalta 30 mg PO Qhs / for depression , may increase up to 60 mg based on as clinically required unable to tolerate side effects 4. Increase Neurotin 300 mg PO BID/chronic pain in mood 5. Appreciate psychiatric consultation and  followup as needed  Arion Shankles,JANARDHAHA R. 11/18/2012, 1:56 PM

## 2012-11-18 NOTE — Progress Notes (Signed)
Patient doing well. Feeling better. States leg pain has been decreased with ambulation.  BP 145/69  Pulse 76  Temp(Src) 97.5 F (36.4 C) (Oral)  Resp 18  SpO2 100%  5/5 DF, PF Sensation intact  S/p 2-5 decompression and 4/5 fusion  - continue PT today - psych consult pending for previous suicidal ideation - plan to transfer to SNF tomorrow - will need to transition to PO pain meds (dilaudid), plan is to proceed with this today - continue coumadin

## 2012-11-18 NOTE — Progress Notes (Signed)
ANTICOAGULATION CONSULT NOTE - Follow Up Consult  Pharmacy Consult for Coumadin Indication: atrial fibrillation  Allergies  Allergen Reactions  . Codeine Nausea And Vomiting  . Crestor (Rosuvastatin Calcium) Other (See Comments)    Leg cramps    Patient Measurements: Height: 5\' 8"  (172.7 cm) Weight: 183 lb 9.6 oz (83.28 kg) IBW/kg (Calculated) : 68.4  Vital Signs: Temp: 97.5 F (36.4 C) (06/17 0527) BP: 145/69 mmHg (06/17 0527) Pulse Rate: 76 (06/17 0527)  Labs:  Recent Labs  11/15/12 1440 11/16/12 0610 11/17/12 0520 11/18/12 0550  HGB 10.9*  --   --   --   HCT 32.2*  --   --   --   PLT 412*  --   --   --   LABPROT 27.2* 29.2* 34.2* 29.0*  INR 2.68* 2.95* 3.65* 2.92*  CREATININE 0.90  --   --   --     Estimated Creatinine Clearance: 66.6 ml/min (by C-G formula based on Cr of 0.9).  Assessment:   INR therapeutic on admission, but then trended up to 3.65 on 6/16. Coumadin held 6/16.  INR back down into target range today.  Home regimen of 6 mg daily may need to be reduced at discharge to SNF, expected 6/18.  Goal of Therapy:  INR 2-3 Monitor platelets by anticoagulation protocol: Yes   Plan:   Coumadin 3 mg today,  Continue daily PT/INR.  CBC in am.  May need 3 mg 2-3 times a week at discharge, and 6 mg other days.  Dennie Fetters, RPh Pager: [phone] 11/18/2012,11:56 AM

## 2012-11-18 NOTE — Progress Notes (Signed)
Physical Therapy Treatment Patient Details Name: Peter Richards MRN: [id number] DOB: Oct 24, 1928 Today's Date: 11/18/2012 Time: 4098-1191 PT Time Calculation (min): 16 min  PT Assessment / Plan / Recommendation Comments on Treatment Session  Pt appears to be in higher spirits today. Highly motivated to walk and "exercise legs". Pt unable to increase amb today. Requires 2+ for amb for safety to manage lines and bring chair. Pt demo balance deficits and decreased independence with transfers. Plans to D/C to [facility] tomorrow for continued rehab. will cont to f/u with pt while in acute setting.     Follow Up Recommendations  SNF;Supervision/Assistance - 24 hour;Supervision for mobility/OOB     Does the patient have the potential to tolerate intense rehabilitation     Barriers to Discharge        Equipment Recommendations  None recommended by PT    Recommendations for Other Services    Frequency Min 5X/week   Plan Discharge plan remains appropriate;Frequency remains appropriate    Precautions / Restrictions Precautions Precautions: Fall;Back Precaution Comments: pt able to independently recall 2/3 back precuations; precuations reinforced  Required Braces or Orthoses: Spinal Brace Spinal Brace: Thoracolumbosacral orthotic;Applied in sitting position Restrictions Weight Bearing Restrictions: No   Pertinent Vitals/Pain 5/10; pt premedicated.     Mobility  Bed Mobility Bed Mobility: Rolling Right;Right Sidelying to Sit Rolling Right: 5: Supervision Right Sidelying to Sit: With rails;HOB flat;5: Supervision Details for Bed Mobility Assistance: required verbal cues for log rolling technique and to maintain back precautions; pt demo decreased safety awareness and initally attempted bed mobility by twisting  in bed  Transfers Transfers: Sit to Stand;Stand to Sit Sit to Stand: 3: Mod assist;From elevated surface;From bed Stand to Sit: 3: Mod assist;To chair/3-in-1;With armrests;With  upper extremity assist Details for Transfer Assistance: required multiple attempts to acheive standing from bed; required mod (A) to achieve with max cues for direction and sequencing; pt demo decreased safety awareness and tends to pull to stand from RW  Ambulation/Gait Ambulation/Gait Assistance: 3: Mod assist Ambulation Distance (Feet): 10 Feet Assistive device: Rolling walker Ambulation/Gait Assistance Details: limited amb today; pt unsteady with gt and continously demo posteriorly lean requiring bracing and cues 100% of time. pt with narrow BOS and scissor gt; unable to correct independently  Gait Pattern: Step-through pattern;Decreased stride length;Decreased step length - right;Decreased step length - left;Shuffle;Trunk flexed;Narrow base of support Gait velocity: slowed Stairs: No Wheelchair Mobility Wheelchair Mobility: No    Exercises Other Exercises Other Exercises: sit <> stand blocked practice from recliner x5 to strengthlen LEs; and provide functional practice and carry over for transfers    PT Diagnosis:    PT Problem List:   PT Treatment Interventions:     PT Goals Acute Rehab PT Goals PT Goal Formulation: With patient/family Time For Goal Achievement: 11/30/12 Potential to Achieve Goals: Good PT Goal: Supine/Side to Sit - Progress: Progressing toward goal PT Goal: Sit to Stand - Progress: Progressing toward goal PT Goal: Stand to Sit - Progress: Progressing toward goal PT Transfer Goal: Bed to Chair/Chair to Bed - Progress: Progressing toward goal PT Goal: Ambulate - Progress: Progressing toward goal Additional Goals PT Goal: Additional Goal #1 - Progress: Progressing toward goal  Visit Information  Last PT Received On: 11/18/12 Assistance Needed: +2 (for safety to follow with chair )    Subjective Data  Subjective: pt lying supine; eager to walk; "i think i just need to exercise these legs"  Patient Stated Goal: back to [employer]  Cognition   Cognition Arousal/Alertness: Awake/alert Behavior During Therapy: WFL for tasks assessed/performed Overall Cognitive Status: Impaired/Different from baseline Area of Impairment: Following commands;Problem solving Following Commands: Follows one step commands consistently (with increased time ) Problem Solving: Slow processing;Decreased initiation;Difficulty sequencing;Requires verbal cues;Requires tactile cues General Comments: Delayed processing time with simple one step commands    Balance  Balance Balance Assessed: Yes Static Sitting Balance Static Sitting - Balance Support: Left upper extremity supported;Feet supported Static Sitting - Level of Assistance: 5: Stand by assistance Static Sitting - Comment/# of Minutes: tolerated sitting ~5 min to donn TLSO  Static Standing Balance Static Standing - Balance Support: Bilateral upper extremity supported;During functional activity Static Standing - Level of Assistance: 3: Mod assist  End of Session PT - End of Session Equipment Utilized During Treatment: Gait belt;Back brace Activity Tolerance: Patient tolerated treatment well Patient left: in chair;with call bell/phone within reach Nurse Communication: Mobility status   GP     Donell Sievert, [place] [phone] 11/18/2012, 8:56 AM

## 2012-11-18 NOTE — Progress Notes (Signed)
Patient was able to get some sleep last night.  He received 1 mg iv dilaudid this morning for pain rate of 5 at 0519.  He had a bed bath and is in bed awaiting breakfast and his am meeting with psych consult.

## 2012-11-19 LAB — CBC
MCH: 30.3 pg (ref 26.0–34.0)
MCHC: 32.7 g/dL (ref 30.0–36.0)
MCV: 92.7 fL (ref 78.0–100.0)
Platelets: 408 10*3/uL — ABNORMAL HIGH (ref 150–400)
RDW: 15.1 % (ref 11.5–15.5)

## 2012-11-19 LAB — PROTIME-INR: Prothrombin Time: 27.2 seconds — ABNORMAL HIGH (ref 11.6–15.2)

## 2012-11-19 MED ORDER — DULOXETINE HCL 30 MG PO CPEP: 30.0000 mg | ORAL_CAPSULE | Freq: Every day | ORAL | Status: DC

## 2012-11-19 MED ORDER — GABAPENTIN 300 MG PO CAPS: 300.0000 mg | ORAL_CAPSULE | Freq: Three times a day (TID) | ORAL | Status: DC

## 2012-11-19 MED ORDER — HYDROMORPHONE HCL 2 MG PO TABS: 2.0000 mg | ORAL_TABLET | ORAL | Status: DC | PRN

## 2012-11-19 NOTE — Discharge Summary (Signed)
Patient ID: Peter Richards MRN: [id number] DOB/AGE: [birth year] 79 y.o.  Admit date: 11/15/2012 Discharge date: 11/19/2012  Admission Diagnoses: Return of BIL leg pain & severe LBP 1 week S/P L2-5 decompression and L4-5 fusion.  Discharge Diagnoses: Improved S/P PT  Past Medical History  Diagnosis Date  . Hyperlipidemia   . Orthostatic hypotension   . Bradycardia   . Syncope     s/p PTVP  . Myocardial infarction     1981  . CAD (coronary artery disease)     prior CABG in 2002 negative Myoview in 2009  . Hypertension   . Stroke     TIA  2007    . GERD (gastroesophageal reflux disease)   . Headache(784.0)   . Arthritis     OSTEOPOROSIS  . Aortic stenosis, mild     by 01/08/08 echo  . Chronic anticoagulation   . Depression   . AAA (abdominal aortic aneurysm)   . Heart attack   . Pacemaker     medtronic pacer  . History of cervical spinal surgery     [place]  [name] HOH (hard of hearing)     BILATERAL HEARING AIDS  . Prostate cancer   . Basal cell adenocarcinoma     'bottom lip" (11/06/2012)  . Bladder cancer   . Attention to urostomy     "since 2006" (11/06/2012)  . Atrial fibrillation     on amiodarone and has PTVP in place  . Exertional shortness of breath   . Chronic radicular pain of lower back   . Anxiety      Consultants: Treatment Team:  Nehemiah Settle, MD  Discharged Condition: Improved  Hospital Course: Peter Richards is an 79 y.o. male who was admitted 11/15/2012 for treatment of severe BIL leg pain and LBP returned after surgery. Patient was also experiencing suicidal ideations. Patient has severe unremitting pain that affects sleep, daily activities, and work/hobbies. Pt underwent CT scan ruling out hematoma and revealing adequate decompression. Dopplers were negative of BIL LE. Pt began to feel better with change in px medication and PT. Was seen by psychiatry for suicidal ideations and medications were changed. Lexapro was d/cd and  cymbalta started at 30mg  dose with potential to increase to 60mg  if tolerates well. neurontin was increased.   Patient was given perioperative antibiotics: Anti-infectives   None       Patient was given sequential compression devices, early ambulation, and chemoprophylaxis to prevent DVT.  Patient benefited maximally from hospital stay   Recent vital signs: Patient Vitals for the past 24 hrs:  BP Temp Temp src Pulse Resp SpO2 Height Weight  11/19/12 0537 179/84 mmHg 97.4 F (36.3 C) Oral 66 16 99 % - -  11/18/12 1938 127/60 mmHg 97.6 F (36.4 C) Oral 60 16 96 % - -  11/18/12 1340 114/51 mmHg 97.7 F (36.5 C) - 63 16 100 % - -  11/18/12 1100 - - - - - - 5\' 8"  (1.727 m) 83.28 kg (183 lb 9.6 oz)     Recent laboratory studies:  Recent Labs  11/18/12 0550 11/19/12 0530  WBC  --  15.5*  HGB  --  10.8*  HCT  --  33.0*  PLT  --  408*  INR 2.92* 2.68*     Discharge Medications:     Medication List    ASK your doctor about these medications       diazepam 5 MG tablet  Commonly known as:  VALIUM  Take 5 mg by mouth every 6 (six) hours as needed (spasms).     escitalopram 20 MG tablet  Commonly known as:  LEXAPRO  Take 20 mg by mouth daily.     gabapentin 100 MG capsule  Commonly known as:  NEURONTIN  Take 100 mg by mouth at bedtime.     HYDROcodone-acetaminophen 5-325 MG per tablet  Commonly known as:  NORCO/VICODIN  Take 1 tablet by mouth every 6 (six) hours as needed for pain.     lidocaine 5 %  Commonly known as:  LIDODERM  Place 1 patch onto the skin daily as needed (for pain). Remove & Discard patch within 12 hours or as directed by MD     metoprolol succinate 25 MG 24 hr tablet  Commonly known as:  TOPROL-XL  Take 25 mg by mouth daily.     nitroGLYCERIN 0.4 MG SL tablet  Commonly known as:  NITROSTAT  Place 1 tablet (0.4 mg total) under the tongue every 5 (five) minutes as needed for chest pain.     polyethylene glycol packet  Commonly known as:   MIRALAX / GLYCOLAX  Take 17 g by mouth daily as needed (for constipation).     simvastatin 10 MG tablet  Commonly known as:  ZOCOR  Take 10 mg by mouth at bedtime.     tiotropium 18 MCG inhalation capsule  Commonly known as:  SPIRIVA  Place 18 mcg into inhaler and inhale daily as needed (for shortness of breath).     warfarin 6 MG tablet  Commonly known as:  COUMADIN  Take 6 mg by mouth daily.     zolpidem 10 MG tablet  Commonly known as:  AMBIEN  Take 10 mg by mouth at bedtime.        Diagnostic Studies: Dg Chest 2 View  10/29/2012   *RADIOLOGY REPORT*  Clinical Data: Preop radiograph  CHEST - 2 VIEW  Comparison: 08/20/2011  Findings: Previous median sternotomy CABG procedure.  There is a left chest wall pacer device with lead in the right atrial appendage and right ventricle.  Normal heart size.  No pleural effusion or edema.  Bilateral peripheral and basilar predominant reticular interstitial abnormality is again noted compatible with chronic interstitial lung disease.  No superimposed airspace consolidation noted.  The patient is status post anterior disc fusion of the lower cervical spine.  IMPRESSION:  1.  No acute cardiopulmonary abnormalities. 2.  Similar appearance of chronic interstitial lung disease.   Original Report Authenticated By: Signa Kell, M.D.   Dg Lumbar Spine 2-3 Views  11/06/2012    *RADIOLOGY REPORT*  Clinical Data: L4-5 PLIF.  L2-5 decompression.  DG C-ARM 1-60 MIN, LUMBAR SPINE - 2-3 VIEW  Technique: Two fluoroscopic intraoperative spot views of the lumbar spine provided.  Comparison:  Post-myelogram CT scan 10/07/2012.  Findings: Images demonstrate pedicle screws and stabilization bars in place at L4-5.  Laminectomy defect extending from L2-L5 is also identified.  There is no fracture.  Loss of disc space height endplate spurring E4-V4 noted.  IMPRESSION: L2-5 decompression and L4-5 PLIF.   Original Report Authenticated By: Holley Dexter, M.D.   Ct Lumbar  Spine Wo Contrast  11/15/2012   *RADIOLOGY REPORT*  Clinical Data: Increasing back pain.  L4-5 fusion 1 week ago.  CT LUMBAR SPINE WITHOUT CONTRAST  Technique:  Multidetector CT imaging of the lumbar spine was performed without intravenous contrast administration. Multiplanar CT image reconstructions were also generated.  Comparison: 10/07/2012 post myelogram  CT.  CT angiogram performed at the same time.  Findings: Previously transitional vertebra was labeled S1. Utilizing this level assignment, the current examination incorporates from the T7-T8 disc space to the lower sacrum.  Sequential pacemaker is in place.  Aneurysm of the abdominal aorta and iliac arteries.  Please see CT angiogram performed the same date. The right common iliac arteries dilated up to 1.9 cm.  T7-8:  Negative.  T8-9:  Foraminal narrowing greater on the right.  T9-10:  Mild right foraminal narrowing.  T10-11:  Negative.  T11-12:  Vacuum disc phenomenon with tiny left posterior lateral gas containing disc. Mild spinal stenosis.  Mild foraminal narrowing bilaterally.  T12-L1:  Minimal bulge greater to the right.  L1-2:  Mild retrolisthesis L1.  Bulge and rotation.  Mild spinal stenosis.  Foraminal narrowing greater on the left.  L2-3:  Prior cement augmentation L2 with similar appearance of the Schmorl's node deformity/mild compression superior endplate. Interval laminectomy.  Bulge.  L3-4:  Interval laminectomy.  Gas posterior aspect of the thecal sac.  This may represent expected postoperative changes.  Infection not excluded if of high clinical concern.  Evaluation of the thecal sac is limited without contrast.  Facet joint degenerative changes.  L4-5:  Recent fusion with pedicle screws and posterior connecting bar.  The L5 pedicle screws slightly breach the bony confines of the neural foramen greater on the left potentially irritating the exiting L5 nerve root greater on the left.  Minimal anterior slip of L4.  Limited evaluation of the  thecal sac secondary lack of contrast.  Small spur contributes to mild bilateral foraminal narrowing.  L5-S1:  Broad-based disc/osteophyte with mild bilateral foraminal narrowing.  Moderate left-sided and mild to moderate right-sided lateral recess stenosis.  Mild spinal stenosis greater on the left.  S1-S2:  Rudimentary disc.  IMPRESSION: Previously transitional vertebra was labeled S1.  Aneurysm of the abdominal aorta and iliac arteries.  Please see CT angiogram performed the same date. The right common iliac arteries dilated up to 1.9 cm.  T11-12:  Vacuum disc phenomenon with tiny left posterior lateral gas containing disc. Mild spinal stenosis.  Mild foraminal narrowing bilaterally.  L1-2:  Mild retrolisthesis L1.  Bulge and rotation.  Mild spinal stenosis.  Foraminal narrowing greater on the left.  L2-3:  Prior cement augmentation L2 with similar appearance of the Schmorl's node deformity/mild compression superior endplate. Interval laminectomy.  Bulge.  L3-4:  Interval laminectomy.  Gas posterior aspect of the thecal sac.  This may represent expected postoperative changes.  Infection not excluded if of high clinical concern.  Evaluation of the thecal sac is limited without contrast.  Facet joint degenerative changes.  L4-5:  Recent fusion with pedicle screws and posterior connecting bar.  The L5 pedicle screws slightly breach the bony confines of the neural foramen greater on the left potentially irritating the exiting L5 nerve root greater on the left.  Minimal anterior slip of L4.  Limited evaluation of the thecal sac secondary lack of contrast.  Small spur contributes to mild bilateral foraminal narrowing.  L5-S1:  Broad-based disc/osteophyte with mild bilateral foraminal narrowing.  Moderate left-sided and mild to moderate right-sided lateral recess stenosis.  Mild spinal stenosis greater on the left.   Original Report Authenticated By: Lacy Duverney, M.D.   Dg Lumbar Spine 1 View  11/06/2012   *RADIOLOGY  REPORT*  Clinical Data: L2-L5 decompression.  L4-5 posterior fusion.  LUMBAR SPINE - 1 VIEW  Comparison: CT 10/07/2012  Findings: The posterior surgical  instruments are directed at the L4- 5 level as numbered on prior CT myelogram.  Prior L2 vertebral augmentation.  IMPRESSION: Intraoperative localization as above.   Original Report Authenticated By: Charlett Nose, M.D.   Dg C-arm 1-60 Min  11/06/2012    *RADIOLOGY REPORT*  Clinical Data: L4-5 PLIF.  L2-5 decompression.  DG C-ARM 1-60 MIN, LUMBAR SPINE - 2-3 VIEW  Technique: Two fluoroscopic intraoperative spot views of the lumbar spine provided.  Comparison:  Post-myelogram CT scan 10/07/2012.  Findings: Images demonstrate pedicle screws and stabilization bars in place at L4-5.  Laminectomy defect extending from L2-L5 is also identified.  There is no fracture.  Loss of disc space height endplate spurring O1-H0 noted.  IMPRESSION: L2-5 decompression and L4-5 PLIF.   Original Report Authenticated By: Holley Dexter, M.D.   Ct Cta Abd/pel W/cm &/or W/o Cm  11/15/2012   *RADIOLOGY REPORT*  Clinical Data:  Abdominal and back pain.  Previous abdominal aortic aneurysm repair. Patient on Coumadin.  Evaluate for leak or rupture.  CT ANGIOGRAPHY ABDOMEN AND PELVIS  Technique:  Multidetector CT imaging of the abdomen and pelvis was performed using the standard protocol during bolus administration of intravenous contrast.  Multiplanar reconstructed images including MIPs were obtained and reviewed to evaluate the vascular anatomy.  Contrast: 80mL OMNIPAQUE IOHEXOL 350 MG/ML SOLN  Comparison:  03/25/2012  Findings:  Stable postop changes from previous abdominal aortic aneurysm repair noted.  No evidence of recurrent aneurysm or retroperitoneal hemorrhage.  No evidence of abdominal aortic or iliac dissection.  The surgical clips seen from prior cholecystectomy.  The liver, spleen, pancreas, adrenal glands, and kidneys are normal in appearance.  No evidence of hydronephrosis.   No soft tissue masses or lymphadenopathy identified within the abdomen or pelvis.  The patient has undergone prior cystoprostatectomy with ileal loop diversion.  No evidence of inflammatory process or abnormal fluid collections.  No evidence of dilated bowel loops or hernia.  No suspicious bone lesions identified.   Review of the MIP images confirms the above findings.  IMPRESSION:  1.  Prior abdominal aortic aneurysm repair.  No evidence of retroperitoneal hemorrhage, recurrent aneurysm, or dissection. 2.  No other acute findings within the abdomen or pelvis.  No evidence of recurrent metastatic carcinoma.   Original Report Authenticated By: Myles Rosenthal, M.D.    Disposition: 03-Skilled Nursing Facility    S/p 2-5 decompression and 4/5 fusion   - Complete PT today  - psych consult completed  - Lexapro D/C'd, Cymbalta 30mg  started, may increase to 60mg  if tolerates well  -Transfer to SNF today  - Pt returning to [facility], SW is awaiting information for transfer today  - FL2 Signed  - Pt tolerating PO px meds in the form of Dilaudid well, will go to SNF on this  - continue coumadin per pharmacy  - F/U in our office 2 wks   Signed: Georga Bora 11/19/2012, 9:26 AM

## 2012-11-19 NOTE — Progress Notes (Signed)
Patient doing well. Feeling better. Denies LBP today. Leg pain is improved with ambulation and pt very pleased he is getting stronger. Pt very eager to head home to [facility] and continue PT there. Denies current suicidal ideations.  BP 179/84  Pulse 66  Temp(Src) 97.4 F (36.3 C) (Oral)  Resp 16  Ht 5\' 8"  (1.727 m)  Wt 83.28 kg (183 lb 9.6 oz)  BMI 27.92 kg/m2  SpO2 99%   5/5 DF, PF, Sensation intact, 2+ DPP, -Homans   S/p 2-5 decompression and 4/5 fusion   - Complete PT today  - psych consult completed  -Lexapro D/C'd, Cymbalta 30mg  started, may increase to 60mg  if tolerates well - Transfer to SNF today  -Pt returning to [employer], SW is awaiting information for transfer today  -FL2 Signed  - Pt tolerating PO px meds in the form of Dilaudid well, will go to SNF on this - continue coumadin per pharmacy - F/U in our office 2 wks

## 2012-11-28 ENCOUNTER — Encounter: Payer: Self-pay | Admitting: *Deleted

## 2012-12-15 ENCOUNTER — Ambulatory Visit (INDEPENDENT_AMBULATORY_CARE_PROVIDER_SITE_OTHER): Payer: Medicare Other | Admitting: *Deleted

## 2012-12-15 DIAGNOSIS — Z950 Presence of cardiac pacemaker: Secondary | ICD-10-CM

## 2012-12-15 DIAGNOSIS — I4891 Unspecified atrial fibrillation: Secondary | ICD-10-CM

## 2012-12-23 ENCOUNTER — Ambulatory Visit (INDEPENDENT_AMBULATORY_CARE_PROVIDER_SITE_OTHER): Payer: Medicare Other | Admitting: Cardiology

## 2012-12-23 DIAGNOSIS — I4891 Unspecified atrial fibrillation: Secondary | ICD-10-CM

## 2012-12-23 LAB — REMOTE PACEMAKER DEVICE
ATRIAL PACING PM: 92
BATTERY VOLTAGE: 2.79 V
RV LEAD AMPLITUDE: 16 mv
RV LEAD IMPEDENCE PM: 499 Ohm
VENTRICULAR PACING PM: 0

## 2012-12-23 LAB — POCT INR: INR: 2.5

## 2012-12-30 ENCOUNTER — Ambulatory Visit (INDEPENDENT_AMBULATORY_CARE_PROVIDER_SITE_OTHER): Payer: Medicare Other | Admitting: Cardiology

## 2012-12-30 DIAGNOSIS — I4891 Unspecified atrial fibrillation: Secondary | ICD-10-CM

## 2013-01-07 ENCOUNTER — Other Ambulatory Visit: Payer: Self-pay

## 2013-01-13 ENCOUNTER — Ambulatory Visit (INDEPENDENT_AMBULATORY_CARE_PROVIDER_SITE_OTHER): Payer: Medicare Other | Admitting: Cardiovascular Disease

## 2013-01-13 DIAGNOSIS — I4891 Unspecified atrial fibrillation: Secondary | ICD-10-CM

## 2013-01-13 LAB — POCT INR: INR: 1.8

## 2013-01-20 ENCOUNTER — Ambulatory Visit (INDEPENDENT_AMBULATORY_CARE_PROVIDER_SITE_OTHER): Payer: Medicare Other | Admitting: Pharmacist

## 2013-01-20 DIAGNOSIS — I4891 Unspecified atrial fibrillation: Secondary | ICD-10-CM

## 2013-01-20 LAB — POCT INR: INR: 2.4

## 2013-01-23 ENCOUNTER — Encounter: Payer: Self-pay | Admitting: *Deleted

## 2013-02-04 ENCOUNTER — Ambulatory Visit (INDEPENDENT_AMBULATORY_CARE_PROVIDER_SITE_OTHER): Payer: Medicare Other | Admitting: Cardiology

## 2013-02-04 ENCOUNTER — Telehealth: Payer: Self-pay | Admitting: Cardiology

## 2013-02-04 DIAGNOSIS — I4891 Unspecified atrial fibrillation: Secondary | ICD-10-CM

## 2013-02-04 LAB — POCT INR: INR: 2.3

## 2013-02-04 NOTE — Telephone Encounter (Signed)
Patient was calling back regarding INR and has already spoken with coumadin clinic

## 2013-02-04 NOTE — Telephone Encounter (Signed)
F/up ° °Pt returning call °

## 2013-02-16 ENCOUNTER — Encounter: Payer: Self-pay | Admitting: Internal Medicine

## 2013-02-25 ENCOUNTER — Encounter (HOSPITAL_BASED_OUTPATIENT_CLINIC_OR_DEPARTMENT_OTHER): Payer: Self-pay | Admitting: Emergency Medicine

## 2013-02-25 ENCOUNTER — Emergency Department (HOSPITAL_BASED_OUTPATIENT_CLINIC_OR_DEPARTMENT_OTHER): Payer: Medicare Other

## 2013-02-25 ENCOUNTER — Other Ambulatory Visit: Payer: Self-pay | Admitting: *Deleted

## 2013-02-25 ENCOUNTER — Emergency Department (HOSPITAL_BASED_OUTPATIENT_CLINIC_OR_DEPARTMENT_OTHER)
Admission: EM | Admit: 2013-02-25 | Discharge: 2013-02-25 | Disposition: A | Discharge status: Home / Self Care | Payer: Medicare Other | Attending: Emergency Medicine | Admitting: Emergency Medicine

## 2013-02-25 ENCOUNTER — Other Ambulatory Visit: Payer: Self-pay | Admitting: Cardiology

## 2013-02-25 ENCOUNTER — Ambulatory Visit: Payer: Self-pay | Admitting: Cardiology

## 2013-02-25 DIAGNOSIS — W1809XA Striking against other object with subsequent fall, initial encounter: Secondary | ICD-10-CM | POA: Insufficient documentation

## 2013-02-25 DIAGNOSIS — Z8673 Personal history of transient ischemic attack (TIA), and cerebral infarction without residual deficits: Secondary | ICD-10-CM | POA: Insufficient documentation

## 2013-02-25 DIAGNOSIS — Z7901 Long term (current) use of anticoagulants: Secondary | ICD-10-CM | POA: Insufficient documentation

## 2013-02-25 DIAGNOSIS — Z8719 Personal history of other diseases of the digestive system: Secondary | ICD-10-CM | POA: Insufficient documentation

## 2013-02-25 DIAGNOSIS — Z8546 Personal history of malignant neoplasm of prostate: Secondary | ICD-10-CM | POA: Insufficient documentation

## 2013-02-25 DIAGNOSIS — S46909A Unspecified injury of unspecified muscle, fascia and tendon at shoulder and upper arm level, unspecified arm, initial encounter: Secondary | ICD-10-CM | POA: Insufficient documentation

## 2013-02-25 DIAGNOSIS — E785 Hyperlipidemia, unspecified: Secondary | ICD-10-CM | POA: Insufficient documentation

## 2013-02-25 DIAGNOSIS — W19XXXA Unspecified fall, initial encounter: Secondary | ICD-10-CM

## 2013-02-25 DIAGNOSIS — F411 Generalized anxiety disorder: Secondary | ICD-10-CM | POA: Insufficient documentation

## 2013-02-25 DIAGNOSIS — F3289 Other specified depressive episodes: Secondary | ICD-10-CM | POA: Insufficient documentation

## 2013-02-25 DIAGNOSIS — S62602A Fracture of unspecified phalanx of right middle finger, initial encounter for closed fracture: Secondary | ICD-10-CM

## 2013-02-25 DIAGNOSIS — Y9389 Activity, other specified: Secondary | ICD-10-CM | POA: Insufficient documentation

## 2013-02-25 DIAGNOSIS — G8929 Other chronic pain: Secondary | ICD-10-CM | POA: Insufficient documentation

## 2013-02-25 DIAGNOSIS — F329 Major depressive disorder, single episode, unspecified: Secondary | ICD-10-CM | POA: Insufficient documentation

## 2013-02-25 DIAGNOSIS — S4980XA Other specified injuries of shoulder and upper arm, unspecified arm, initial encounter: Secondary | ICD-10-CM | POA: Insufficient documentation

## 2013-02-25 DIAGNOSIS — I10 Essential (primary) hypertension: Secondary | ICD-10-CM | POA: Insufficient documentation

## 2013-02-25 DIAGNOSIS — IMO0002 Reserved for concepts with insufficient information to code with codable children: Secondary | ICD-10-CM | POA: Insufficient documentation

## 2013-02-25 DIAGNOSIS — H9190 Unspecified hearing loss, unspecified ear: Secondary | ICD-10-CM | POA: Insufficient documentation

## 2013-02-25 DIAGNOSIS — Z950 Presence of cardiac pacemaker: Secondary | ICD-10-CM | POA: Insufficient documentation

## 2013-02-25 DIAGNOSIS — Z87891 Personal history of nicotine dependence: Secondary | ICD-10-CM | POA: Insufficient documentation

## 2013-02-25 DIAGNOSIS — Y9289 Other specified places as the place of occurrence of the external cause: Secondary | ICD-10-CM | POA: Insufficient documentation

## 2013-02-25 DIAGNOSIS — Z8551 Personal history of malignant neoplasm of bladder: Secondary | ICD-10-CM | POA: Insufficient documentation

## 2013-02-25 DIAGNOSIS — M25511 Pain in right shoulder: Secondary | ICD-10-CM

## 2013-02-25 DIAGNOSIS — Z8739 Personal history of other diseases of the musculoskeletal system and connective tissue: Secondary | ICD-10-CM | POA: Insufficient documentation

## 2013-02-25 DIAGNOSIS — I252 Old myocardial infarction: Secondary | ICD-10-CM | POA: Insufficient documentation

## 2013-02-25 DIAGNOSIS — I4891 Unspecified atrial fibrillation: Secondary | ICD-10-CM | POA: Insufficient documentation

## 2013-02-25 DIAGNOSIS — I2510 Atherosclerotic heart disease of native coronary artery without angina pectoris: Secondary | ICD-10-CM | POA: Insufficient documentation

## 2013-02-25 DIAGNOSIS — Z79899 Other long term (current) drug therapy: Secondary | ICD-10-CM | POA: Insufficient documentation

## 2013-02-25 MED ORDER — OXYCODONE-ACETAMINOPHEN 5-325 MG PO TABS: 1.0000 | ORAL_TABLET | Freq: Four times a day (QID) | ORAL | Status: DC | PRN

## 2013-02-25 MED ORDER — WARFARIN SODIUM 5 MG PO TABS: 5.0000 mg | ORAL_TABLET | ORAL | Status: DC

## 2013-02-25 MED ORDER — OXYCODONE-ACETAMINOPHEN 5-325 MG PO TABS
1.0000 | ORAL_TABLET | Freq: Once | ORAL | Status: AC
  Administered 2013-02-25: 1 via ORAL
  Filled 2013-02-25: qty 1

## 2013-02-25 NOTE — ED Notes (Signed)
Patient transported from X-ray and CT

## 2013-02-25 NOTE — ED Notes (Addendum)
Patient transported to CT and X ray 

## 2013-02-25 NOTE — ED Notes (Signed)
Pt states he "lost his balance" and fell onto a kitchen cabinet hit his R side, R shoulder and caught his R hand on the cabinet. His R knee hit the linoleum floor. Denies dizziness or LOC prior to fall. Pt to room via w/c, no acute distress at this time.

## 2013-02-25 NOTE — ED Provider Notes (Signed)
CSN: [id number]     Arrival date & time 02/25/13  0930 History   First MD Initiated Contact with Patient 02/25/13 0945     Chief Complaint  Patient presents with  . Fall   (Consider location/radiation/quality/duration/timing/severity/associated sxs/prior Treatment) HPI Comments: Stumbled in the kitchen. No preceding symptoms.   Patient is a 79 y.o. male presenting with fall. The history is provided by the patient.  Fall This is a new problem. The current episode started yesterday. Episode frequency: once. The problem has been resolved. Pertinent negatives include no chest pain, no abdominal pain, no headaches and no shortness of breath. Nothing aggravates the symptoms. Nothing relieves the symptoms. He has tried nothing for the symptoms. The treatment provided mild relief.    Past Medical History  Diagnosis Date  . Hyperlipidemia   . Orthostatic hypotension   . Bradycardia   . Syncope     s/p PTVP  . Myocardial infarction     1981  . CAD (coronary artery disease)     prior CABG in 2002 negative Myoview in 2009  . Hypertension   . Stroke     TIA  2007    . GERD (gastroesophageal reflux disease)   . Headache(784.0)   . Arthritis     OSTEOPOROSIS  . Aortic stenosis, mild     by 01/08/08 echo  . Chronic anticoagulation   . Depression   . AAA (abdominal aortic aneurysm)   . Heart attack   . Pacemaker     medtronic pacer  . History of cervical spinal surgery     [place]  [name] HOH (hard of hearing)     BILATERAL HEARING AIDS  . Prostate cancer   . Basal cell adenocarcinoma     'bottom lip" (11/06/2012)  . Bladder cancer   . Attention to urostomy     "since 2006" (11/06/2012)  . Atrial fibrillation     on amiodarone and has PTVP in place  . Exertional shortness of breath   . Chronic radicular pain of lower back   . Anxiety    Past Surgical History  Procedure Laterality Date  . Insert / replace / remove pacemaker      11/2009  DR BRODIE    . Prostatectomy     BLADDER ALSO REMOVED (OSTOMY BAG)  2006  FOR CANCER     . Abdominal aortic aneurysm repair  1999    STENT PLACED    . Cataract extraction w/ intraocular lens  implant, bilateral    . Appendectomy      1959  . Revision urostomy cutaneous    . Revision urostomy cutaneous  2006    Urostomy bag placed on right lower abdomen  . Vertebroplasty  08/22/2011    Procedure: VERTEBROPLASTY;  Surgeon: Carmela Hurt, MD;  Location: [facility];  Service: Neurosurgery;  Laterality: N/A;  Lumbar Two vertebroplasty  . Cholecystectomy      4 YRS AGO  . Posterior lumbar fusion  11/06/2012  . Coronary artery bypass graft  1999    CABG X2  . Skin cancer excision      "bottom lip" (11/06/2012)  . Bladder surgery      "for cancer" (11/06/2012)  . Lumbar laminectomy/decompression microdiscectomy Bilateral 11/06/2012    Procedure: LUMBAR LAMINECTOMY/DECOMPRESSION MICRODISCECTOMY;  Surgeon: Emilee Hero, MD;  [facility];  Service: Orthopedics;  Laterality: Bilateral;  Lumbar 2-5 decompression   Family History  Problem Relation Age of Onset  . Cancer  Father     stomach  . Cancer Sister     lung   History  Substance Use Topics  . Smoking status: Former Smoker -- 3.00 packs/day for 50 years    Types: Cigarettes    Quit date: 06/04/1978  . Smokeless tobacco: Never Used  . Alcohol Use: No     Comment: rare "once a month"    Review of Systems  Respiratory: Negative for shortness of breath.   Cardiovascular: Negative for chest pain.  Gastrointestinal: Negative for abdominal pain.  Neurological: Negative for headaches.  All other systems reviewed and are negative.    Allergies  Codeine and Crestor  Home Medications   Current Outpatient Rx  Name  Route  Sig  Dispense  Refill  . acetaminophen (TYLENOL) 325 MG tablet   Oral   Take 650 mg by mouth every 4 (four) hours as needed for pain.         [name] HYDROcodone-acetaminophen (NORCO/VICODIN) 5-325 MG per tablet   Oral   Take 1 tablet by  mouth every 4 (four) hours as needed for pain.         . diazepam (VALIUM) 5 MG tablet   Oral   Take 5 mg by mouth every 6 (six) hours as needed (spasms).         . DULoxetine (CYMBALTA) 30 MG capsule   Oral   Take 1 capsule (30 mg total) by mouth daily.   60 capsule   3   . gabapentin (NEURONTIN) 300 MG capsule   Oral   Take 1 capsule (300 mg total) by mouth 3 (three) times daily.   90 capsule   1   . HYDROmorphone (DILAUDID) 2 MG tablet   Oral   Take 1-2 tablets (2-4 mg total) by mouth every 4 (four) hours as needed for pain.   30 tablet   0   . lidocaine (LIDODERM) 5 %   Transdermal   Place 1 patch onto the skin daily as needed (for pain). Remove & Discard patch within 12 hours or as directed by MD         . metoprolol succinate (TOPROL-XL) 25 MG 24 hr tablet   Oral   Take 25 mg by mouth daily.         . nitroGLYCERIN (NITROSTAT) 0.4 MG SL tablet   Sublingual   Place 1 tablet (0.4 mg total) under the tongue every 5 (five) minutes as needed for chest pain.   25 tablet   prn   . polyethylene glycol (MIRALAX / GLYCOLAX) packet   Oral   Take 17 g by mouth daily as needed (for constipation).          . simvastatin (ZOCOR) 10 MG tablet   Oral   Take 10 mg by mouth at bedtime.         [name] tiotropium (SPIRIVA) 18 MCG inhalation capsule   Inhalation   Place 18 mcg into inhaler and inhale daily as needed (for shortness of breath).         . warfarin (COUMADIN) 6 MG tablet   Oral   Take 6 mg by mouth daily.         [name] zolpidem (AMBIEN) 10 MG tablet   Oral   Take 10 mg by mouth at bedtime.           BP 128/77  Pulse 84  Temp(Src) 97.5 F (36.4 C) (Oral)  Resp 22  Ht 5\' 8"  (1.727 m)  Wt  173 lb (78.472 kg)  BMI 26.31 kg/m2  SpO2 96% Physical Exam  Constitutional: He is oriented to person, place, and time. He appears well-developed and well-nourished. No distress.  HENT:  Head: Normocephalic and atraumatic.  Mouth/Throat: No oropharyngeal  exudate.  Eyes: EOM are normal. Pupils are equal, round, and reactive to light.  Neck: Normal range of motion. Neck supple.  Cardiovascular: Normal rate and regular rhythm.  Exam reveals no friction rub.   No murmur heard. Pulmonary/Chest: Effort normal and breath sounds normal. No respiratory distress. He has no wheezes. He has no rales. He exhibits no tenderness.  Abdominal: He exhibits no distension. There is no tenderness. There is no rebound.  Musculoskeletal: He exhibits no edema.       Right shoulder: He exhibits decreased range of motion, tenderness (scapular), bony tenderness (lateral deltoid) and pain. He exhibits no laceration.       Right wrist: He exhibits decreased range of motion (mild) and bony tenderness (Distal radius with redness). He exhibits no tenderness (no snuffbox tendernesss).       Cervical back: He exhibits no bony tenderness.       Thoracic back: He exhibits no bony tenderness.       Lumbar back: He exhibits no bony tenderness.       Hands: Neurological: He is alert and oriented to person, place, and time.  Skin: He is not diaphoretic.    ED Course  Procedures (including critical care time) Labs Review Labs Reviewed - No data to display Imaging Review Dg Chest 2 View  02/25/2013   *RADIOLOGY REPORT*  Clinical Data: Post fall, now with right scapular pain, history of prostate cancer and atrial fibrillation, initial encounter.  CHEST - 2 VIEW  Comparison: 08/20/2011; Chest CT - 04/11/2012  Findings:  Grossly unchanged cardiac silhouette and mediastinal contours post median sternotomy and CABG.  Stable positioning of support apparatus.  Lung volumes remain reduced with mild elevation of the right hemidiaphragm.  Basilar and peripheral predominant coarse reticular opacities are grossly unchanged.  There is persistent right upper lobe volume loss with mild elevation of the right minor fissure.  Grossly unchanged perihilar and medial basilar opacities, left greater than  right.  No new focal airspace opacities.  No pleural effusion or pneumothorax.  No evidence of edema.  Grossly unchanged bones including sequela of lower cervical ACDF, incompletely evaluated.  Post cholecystectomy.  Additional surgical clips overlie the midline of the upper abdomen.  IMPRESSION: Grossly unchanged findings of fibrosis without definite superimposed acute cardiopulmonary disease.   Original Report Authenticated By: Tacey Ruiz, MD   Dg Shoulder Right  02/25/2013   *RADIOLOGY REPORT*  Clinical Data: Post fall yesterday, now with persistent right arm pain, initial encounter.  RIGHT SHOULDER - 2+ VIEW  Comparison: None.  Findings: No fracture or dislocation.  Mild degenerative change of the glenohumeral joint is suspected with joint space loss, subchondral sclerosis and osteophytosis.  Normal appearance of the acromioclavicular joint.  No evidence of calcific tendonitis.  Limited visualization of the adjacent thorax demonstrates peripheral coarse reticular opacities as demonstrated on chest radiograph performed earlier same day.  Post median sternotomy. Left anterior chest wall pacemaker.  Lower cervical ACDF.  The regional soft tissues are normal.  IMPRESSION: 1.  No acute findings. 2.  Mild degenerative change of the glenohumeral joint.   Original Report Authenticated By: Tacey Ruiz, MD   Dg Elbow Complete Right  02/25/2013   *RADIOLOGY REPORT*  Clinical Data: Post fall,  now with posterior elbow pain, initial encounter.  RIGHT ELBOW - COMPLETE 3+ VIEW  Comparison: None  Findings:  The lateral radiograph is degraded due to obliquity.  No definite displaced fracture or elbow joint effusion.  Joint spaces appear preserved.  Osseous fragment adjacent to the medial and lateral epicondyles may represent either accessory ossicles versus the sequela of remote injury.  Regional soft tissues are otherwise normal.  No radiopaque foreign body.  IMPRESSION: No definite displaced fracture or bone joint  effusion.   Original Report Authenticated By: Tacey Ruiz, MD   Dg Wrist Complete Right  02/25/2013   *RADIOLOGY REPORT*  Clinical Data: Post fall last evening, now with volar sided wrist pain and pain involving the distal aspect of the ulna, initial encounter.  RIGHT WRIST - COMPLETE 3+ VIEW  Comparison: Right hand radiographs - earlier same day  Findings:  Osteopenia.  No definite displaced fracture.  Degenerative change of the radiocarpal joint with joint space loss and subchondral sclerosis. Minimal amount of chondrocalcinosis.  Degenerative change of the STT joints of the base of the thumb. Suspected mild soft tissue swelling about the wrist.  No radiopaque foreign body.  IMPRESSION: 1.  Osteopenia without definite displaced fracture. 2.  Chondrocalcinosis suggestive of CPPD. 3.  Degenerative change of the radiocarpal and STT joints of the base of the thumb.   Original Report Authenticated By: Tacey Ruiz, MD   Ct Head Wo Contrast  02/25/2013   CLINICAL DATA:  Larey Seat last night abrasion posteriorly  EXAM: CT HEAD WITHOUT CONTRAST  TECHNIQUE: Contiguous axial images were obtained from the base of the skull through the vertex without intravenous contrast.  COMPARISON:  CT brain scan of 11/11/2009  FINDINGS: The ventricular system is prominent as are the cortical sulci, indicative of diffuse atrophy. The septum is midline in position. Moderate small vessel ischemic change is noted throughout the periventricular white matter. No hemorrhage, mass lesion, or acute infarction is seen. On bone window images, no calvarial fracture is noted. The bones are osteopenic.  IMPRESSION: Atrophy and small vessel disease. No acute intracranial abnormality.   Electronically Signed   By: Dwyane Dee M.D.   On: 02/25/2013 12:01   Dg Hand Complete Right  02/25/2013   *RADIOLOGY REPORT*  Clinical Data: Post fall, now with proximal pain and tenderness, primarily involving the PIP joint of the third digit, initial encounter.   RIGHT HAND - COMPLETE 3+ VIEW  Comparison: Right wrist radiographs of earlier same day  Findings:  Osteopenia.  There is an obliquely oriented lucency involving the base of the middle phalanx of the third digit which may represent a nondisplaced fracture.  Possible minimal amount of adjacent soft tissue swelling.  No dislocation.  Degenerative change of the first and second MCP joints.  Advanced degenerative changes of the STT joints of the base of the thumb. Chondrocalcinosis.  No radiopaque foreign body.  IMPRESSION: 1.  Obliquely oriented lucency through the base of the middle phalanx of the third digit may represent a nondisplaced intra- articular fracture.  Correlation for point tenderness at this location is recommended. 2.  Chondrocalcinosis suggestive of CPPD with associated degenerative change of the hand as above.   Original Report Authenticated By: Tacey Ruiz, MD    MDM   1. Fracture of phalanx of right middle finger, closed, initial encounter   2. Fall, initial encounter   3. Shoulder pain, acute, right    Or 79 year old male presents with a fall. Fell yesterday while  in the kitchen. No preceding symptoms. He did bump his head, however did not lose consciousness. He is on warfarin. He denies any blurry vision or headaches this time. He also states some right shoulder right hand pain. On exam, vitals are stable. He is her right shoulder mild abrasion on the lateral deltoid with tenderness. He also is tenderness extending back of his scapula. He has no chest wall tenderness or difficulty breathing. He has normal right elbow. He has a right wrist with mild redness and swelling at the distal radius on the volar side. He has distal ulnar tenderness. He has no snuffbox tenderness he also has third finger PIP joint swelling and tenderness on the right. Will x-ray his hand wrist elbow and right shoulder. Also x-rays chest CT his head. CT normal. Xrays show 3rd finger middle phalanx fracture on the R.  No intraarticular extension. Splinted, given hand f/u. Easily ambulatory upon discharge.   I have reviewed all labs and imaging and considered them in my medical decision making.   Dagmar Hait, MD 02/25/13 1447

## 2013-03-03 ENCOUNTER — Ambulatory Visit (INDEPENDENT_AMBULATORY_CARE_PROVIDER_SITE_OTHER): Payer: Medicare Other | Admitting: Internal Medicine

## 2013-03-03 ENCOUNTER — Encounter: Payer: Self-pay | Admitting: Internal Medicine

## 2013-03-03 VITALS — BP 131/79 | HR 82 | Ht 69.0 in | Wt 175.0 lb

## 2013-03-03 DIAGNOSIS — R55 Syncope and collapse: Secondary | ICD-10-CM

## 2013-03-03 DIAGNOSIS — Z8679 Personal history of other diseases of the circulatory system: Secondary | ICD-10-CM

## 2013-03-03 DIAGNOSIS — I4891 Unspecified atrial fibrillation: Secondary | ICD-10-CM

## 2013-03-03 DIAGNOSIS — I498 Other specified cardiac arrhythmias: Secondary | ICD-10-CM

## 2013-03-03 DIAGNOSIS — Z950 Presence of cardiac pacemaker: Secondary | ICD-10-CM

## 2013-03-03 LAB — PACEMAKER DEVICE OBSERVATION
AL AMPLITUDE: 2 mv
BAMS-0001: 175 {beats}/min
BATTERY VOLTAGE: 2.79 V
RV LEAD AMPLITUDE: 11.2 mv
RV LEAD IMPEDENCE PM: 543 Ohm
RV LEAD THRESHOLD: 1.25 V
VENTRICULAR PACING PM: 1

## 2013-03-03 NOTE — Assessment & Plan Note (Signed)
His Medtronic dual-chamber pacemaker is working normally. We'll plan to recheck in several months. 

## 2013-03-03 NOTE — Progress Notes (Signed)
HPI [name] returns today for followup. He is a very pleasant 79 year old man with an ischemic heart disease, symptomatic bradycardia, status post permanent pacemaker insertion. He has hypertension. He denies chest pain. He denies peripheral edema. He notes shortness of breath. He denies wheezes or coughing. In the interim, he broke his finger after falling. He has not had any additional falls. He denies syncope. Allergies  Allergen Reactions  . Codeine Nausea And Vomiting  . Crestor [Rosuvastatin Calcium] Other (See Comments)    Leg cramps     Current Outpatient Prescriptions  Medication Sig Dispense Refill  . acetaminophen (TYLENOL) 325 MG tablet Take 650 mg by mouth every 4 (four) hours as needed for pain.      . diazepam (VALIUM) 5 MG tablet Take 5 mg by mouth every 6 (six) hours as needed (spasms).      . DULoxetine (CYMBALTA) 30 MG capsule Take 1 capsule (30 mg total) by mouth daily.  60 capsule  3  . gabapentin (NEURONTIN) 300 MG capsule Take 1 capsule (300 mg total) by mouth 3 (three) times daily.  90 capsule  1  . HYDROcodone-acetaminophen (NORCO/VICODIN) 5-325 MG per tablet Take 1 tablet by mouth every 4 (four) hours as needed for pain.      [name] HYDROmorphone (DILAUDID) 2 MG tablet Take 1-2 tablets (2-4 mg total) by mouth every 4 (four) hours as needed for pain.  30 tablet  0  . lidocaine (LIDODERM) 5 % Place 1 patch onto the skin daily as needed (for pain). Remove & Discard patch within 12 hours or as directed by MD      . metoprolol succinate (TOPROL-XL) 25 MG 24 hr tablet Take 25 mg by mouth daily.      . nitroGLYCERIN (NITROSTAT) 0.4 MG SL tablet Place 1 tablet (0.4 mg total) under the tongue every 5 (five) minutes as needed for chest pain.  25 tablet  prn  . oxyCODONE-acetaminophen (PERCOCET/ROXICET) 5-325 MG per tablet Take 1 tablet by mouth every 6 (six) hours as needed for pain.  15 tablet  0  . polyethylene glycol (MIRALAX / GLYCOLAX) packet Take 17 g by mouth daily as  needed (for constipation).       . simvastatin (ZOCOR) 10 MG tablet Take 10 mg by mouth at bedtime.      [name] tiotropium (SPIRIVA) 18 MCG inhalation capsule Place 18 mcg into inhaler and inhale daily as needed (for shortness of breath).      . warfarin (COUMADIN) 5 MG tablet Take 1 tablet (5 mg total) by mouth as directed.  90 tablet  1  . zolpidem (AMBIEN) 10 MG tablet Take 10 mg by mouth at bedtime.        No current facility-administered medications for this visit.     Past Medical History  Diagnosis Date  . Hyperlipidemia   . Orthostatic hypotension   . Bradycardia   . Syncope     s/p PTVP  . Myocardial infarction     1981  . CAD (coronary artery disease)     prior CABG in 2002 negative Myoview in 2009  . Hypertension   . Stroke     TIA  2007    . GERD (gastroesophageal reflux disease)   . Headache(784.0)   . Arthritis     OSTEOPOROSIS  . Aortic stenosis, mild     by 01/08/08 echo  . Chronic anticoagulation   . Depression   . AAA (abdominal aortic aneurysm)   . Heart attack   .  Pacemaker     medtronic pacer  . History of cervical spinal surgery     over [place]  [name] HOH (hard of hearing)     BILATERAL HEARING AIDS  . Prostate cancer   . Basal cell adenocarcinoma     'bottom lip" (11/06/2012)  . Bladder cancer   . Attention to urostomy     "since 2006" (11/06/2012)  . Atrial fibrillation     on amiodarone and has PTVP in place  . Exertional shortness of breath   . Chronic radicular pain of lower back   . Anxiety     ROS:   All systems reviewed and negative except as noted in the HPI.   Past Surgical History  Procedure Laterality Date  . Insert / replace / remove pacemaker      11/2009  DR BRODIE    . Prostatectomy      BLADDER ALSO REMOVED (OSTOMY BAG)  2006  FOR CANCER     . Abdominal aortic aneurysm repair  1999    STENT PLACED    . Cataract extraction w/ intraocular lens  implant, bilateral    . Appendectomy      1959  . Revision urostomy cutaneous     . Revision urostomy cutaneous  2006    Urostomy bag placed on right lower abdomen  . Vertebroplasty  08/22/2011    Procedure: VERTEBROPLASTY;  Surgeon: Carmela Hurt, MD;  Location: [facility];  Service: Neurosurgery;  Laterality: N/A;  Lumbar Two vertebroplasty  . Cholecystectomy      4 YRS AGO  . Posterior lumbar fusion  11/06/2012  . Coronary artery bypass graft  1999    CABG X2  . Skin cancer excision      "bottom lip" (11/06/2012)  . Bladder surgery      "for cancer" (11/06/2012)  . Lumbar laminectomy/decompression microdiscectomy Bilateral 11/06/2012    Procedure: LUMBAR LAMINECTOMY/DECOMPRESSION MICRODISCECTOMY;  Surgeon: Emilee Hero, MD;  Location: [facility] OR;  Service: Orthopedics;  Laterality: Bilateral;  Lumbar 2-5 decompression     Family History  Problem Relation Age of Onset  . Cancer Father     stomach  . Cancer Sister     lung     History   Social History  . Marital Status: Married    Spouse Name: N/A    Number of Children: 3  . Years of Education: N/A   Occupational History  . Retired    Social History Main Topics  . Smoking status: Former Smoker -- 3.00 packs/day for 50 years    Types: Cigarettes    Quit date: 06/04/1978  . Smokeless tobacco: Never Used  . Alcohol Use: No     Comment: rare "once a month"  . Drug Use: No  . Sexual Activity: No   Other Topics Concern  . Not on file   Social History Narrative  . No narrative on file     There were no vitals taken for this visit.  Physical Exam:  Well appearing elderly man, NAD HEENT: Unremarkable Neck:  No JVD, no thyromegally Lungs:  Clear with no wheezes, rales, or rhonchi. Well-healed pacemaker incision HEART:  Regular rate rhythm, no murmurs, no rubs, no clicks Abd:  soft, positive bowel sounds, no organomegally, no rebound, no guarding Ext:  2 plus pulses, no edema, no cyanosis, no clubbing, right middle finger in a brace. Skin:  No rashes no nodules Neuro:  CN II through XII  intact, motor grossly  intact  DEVICE  Normal device function.  See PaceArt for details.   Assess/Plan:

## 2013-03-03 NOTE — Patient Instructions (Signed)

## 2013-03-03 NOTE — Assessment & Plan Note (Signed)
The patient is maintaining sinus rhythm. We'll continue his current medical therapy.

## 2013-03-03 NOTE — Assessment & Plan Note (Signed)
No recurrent episodes, status post pacemaker insertion. He will undergo watchful waiting.

## 2013-03-04 ENCOUNTER — Ambulatory Visit (INDEPENDENT_AMBULATORY_CARE_PROVIDER_SITE_OTHER): Payer: Medicare Other | Admitting: Cardiology

## 2013-03-04 ENCOUNTER — Ambulatory Visit: Payer: Commercial Managed Care - HMO | Admitting: Pharmacist

## 2013-03-04 ENCOUNTER — Encounter: Payer: Self-pay | Admitting: Cardiology

## 2013-03-04 VITALS — BP 128/80 | HR 76 | Ht 69.0 in | Wt 175.0 lb

## 2013-03-04 DIAGNOSIS — I4891 Unspecified atrial fibrillation: Secondary | ICD-10-CM

## 2013-03-04 DIAGNOSIS — E7800 Pure hypercholesterolemia, unspecified: Secondary | ICD-10-CM

## 2013-03-04 DIAGNOSIS — R55 Syncope and collapse: Secondary | ICD-10-CM

## 2013-03-04 DIAGNOSIS — I2510 Atherosclerotic heart disease of native coronary artery without angina pectoris: Secondary | ICD-10-CM

## 2013-03-04 DIAGNOSIS — E785 Hyperlipidemia, unspecified: Secondary | ICD-10-CM

## 2013-03-04 NOTE — Progress Notes (Signed)
Peter Richards Date of Birth:  [birth year] [facility] [address] [place], [place]  [address] [phone]         Fax   [phone]  History of Present Illness: This pleasant 79 year old gentleman is seen for a scheduled followup office visit after a long absence. He has a history of tachybradycardia syndrome and paroxysmal atrial fibrillation. He has a dual-chamber pacemaker. His pacemaker is followed by Dr. [name]. He is not having any symptoms from his heart recently. He has a history of hypercholesterolemia. The patient is on long-term Coumadin anticoagulation because of his paroxysmal A. fib. He is maintaining normal sinus rhythm.  The patient has been off amiodarone since last year because of question of pulmonary fibrosis.  A recent chest x-ray showed no change in the amount of fibrosis from last year.  The patient had an echocardiogram 04/11/12 showed an ejection fraction of 55-65% with grade 1 diastolic dysfunction.  His last nuclear stress test was an adenosine study on 04/21/08 which showed an ejection fraction of 61% and no ischemia. Since we last saw him he was admitted by Dr. Yevette Edwards and underwent successful back surgery with insertion of rods.  It has relieved his low back pain.  Current Outpatient Prescriptions  Medication Sig Dispense Refill  . acetaminophen (TYLENOL) 325 MG tablet Take 650 mg by mouth every 4 (four) hours as needed for pain.      . DULoxetine (CYMBALTA) 30 MG capsule Take 1 capsule (30 mg total) by mouth daily.  60 capsule  3  . gabapentin (NEURONTIN) 300 MG capsule Take 1 capsule (300 mg total) by mouth 3 (three) times daily.  90 capsule  1  . HYDROcodone-acetaminophen (NORCO/VICODIN) 5-325 MG per tablet Take 1 tablet by mouth every 4 (four) hours as needed for pain.      . metoprolol succinate (TOPROL-XL) 25 MG 24 hr tablet Take 25 mg by mouth daily.      . nitroGLYCERIN (NITROSTAT) 0.4 MG SL tablet Place 1 tablet (0.4 mg total)  under the tongue every 5 (five) minutes as needed for chest pain.  25 tablet  prn  . oxyCODONE-acetaminophen (PERCOCET/ROXICET) 5-325 MG per tablet       . polyethylene glycol (MIRALAX / GLYCOLAX) packet Take 17 g by mouth daily as needed (for constipation).       . simvastatin (ZOCOR) 10 MG tablet Take 10 mg by mouth at bedtime.      [name] tiotropium (SPIRIVA) 18 MCG inhalation capsule Place 18 mcg into inhaler and inhale daily as needed (for shortness of breath).      . warfarin (COUMADIN) 5 MG tablet Take 1 tablet (5 mg total) by mouth as directed.  90 tablet  1  . zolpidem (AMBIEN) 10 MG tablet Take 10 mg by mouth at bedtime.        No current facility-administered medications for this visit.    Allergies  Allergen Reactions  . Codeine Nausea And Vomiting  . Crestor [Rosuvastatin Calcium] Other (See Comments)    Leg cramps    Patient Active Problem List   Diagnosis Date Noted  . COPD (chronic obstructive pulmonary disease) 06/05/2012  . Interstitial lung disease 01/22/2012  . Lumbar compression fracture 08/24/2011  . Dyslipidemia 05/02/2011  . Low back pain 05/02/2011  . Hx of bladder cancer 01/01/2011  . A-fib 11/15/2010  . Atrial fibrillation 08/25/2010  . HYPERLIPIDEMIA 11/18/2009  . CORONARY ARTERY DISEASE 11/18/2009  . BRADYCARDIA 11/18/2009  . HYPOTENSION, ORTHOSTATIC  11/18/2009  . SYNCOPE 11/18/2009  . CHEST PAIN 11/18/2009  . ABDOMINAL AORTIC ANEURYSM, HX OF 11/18/2009  . ATRIAL FIBRILLATION, HX OF 11/18/2009  . PACEMAKER, PERMANENT 11/18/2009    History  Smoking status  . Former Smoker -- 3.00 packs/day for 50 years  . Types: Cigarettes  . Quit date: 06/04/1978  Smokeless tobacco  . Never Used    History  Alcohol Use No    Comment: rare "once a month"    Family History  Problem Relation Age of Onset  . Cancer Father     stomach  . Cancer Sister     lung    Review of Systems: Constitutional: no fever chills diaphoresis or fatigue or change in  weight.  Head and neck: no hearing loss, no epistaxis, no photophobia or visual disturbance. Respiratory: No cough, shortness of breath or wheezing. Cardiovascular: No chest pain peripheral edema, palpitations. Gastrointestinal: No abdominal distention, no abdominal pain, no change in bowel habits hematochezia or melena. Genitourinary: No dysuria, no frequency, no urgency, no nocturia. Musculoskeletal:No arthralgias, no back pain, no gait disturbance or myalgias. Neurological: No dizziness, no headaches, no numbness, no seizures, no syncope, no weakness, no tremors. Hematologic: No lymphadenopathy, no easy bruising. Psychiatric: No confusion, no hallucinations, no sleep disturbance.    Physical Exam: Filed Vitals:   03/04/13 1405  BP: 128/80  Pulse: 76   general appearance reveals a well-developed well-nourished gentleman in no distress.The head and neck exam reveals pupils equal and reactive.  Extraocular movements are full.  There is no scleral icterus.  The mouth and pharynx are normal.  The neck is supple.  The carotids reveal no bruits.  The jugular venous pressure is normal.  The  thyroid is not enlarged.  There is no lymphadenopathy.  The chest reveals scattered rhonchi.  There are no rales or rhonchi.  Expansion of the chest is symmetrical.  The precordium is quiet.  The first heart sound is normal.  The second heart sound is physiologically split.  There is no murmur gallop rub or click.  There is no abnormal lift or heave.  The abdomen is soft and nontender.  The bowel sounds are normal.  The liver and spleen are not enlarged.  There are no abdominal masses.  There are no abdominal bruits.  Extremities reveal good pedal pulses.  There is no phlebitis or edema.  There is no cyanosis or clubbing.  Strength is normal and symmetrical in all extremities.  There is no lateralizing weakness.  There are no sensory deficits.  The skin is warm and dry.  There is no rash.    Assessment /  Plan: The patient is to continue same medication.  He remains off amiodarone because of concern over possible pulmonary fibrosis.  Chest x-rays have been stable. Recheck in 4 months for office visit lipid panel hepatic function panel and basal metabolic panel and EKG

## 2013-03-04 NOTE — Assessment & Plan Note (Signed)
The patient has a history of remote CABG.  He has not been having a recurrent chest pain or angina.  He has had some right sided chest wall discomfort following a fall about a month ago in which he fractured a finger on his right hand

## 2013-03-04 NOTE — Assessment & Plan Note (Signed)
The patient has not had any syncope since the insertion of pacemaker.  His pacemaker was checked yesterday by Dr. [name] and the patient was told that is functioning well and has many more years of expected battery life.

## 2013-03-04 NOTE — Assessment & Plan Note (Signed)
The patient remains on low-dose simvastatin 10 mg a day.  He is not having myalgias.  We will plan to recheck fasting labs at his next visit

## 2013-03-04 NOTE — Patient Instructions (Signed)
Your physician recommends that you continue on your current medications as directed. Please refer to the Current Medication list given to you today.  Your physician wants you to follow-up in: 4 months with fasting labs (lp/bmet/hfp) and ekg You will receive a reminder letter in the mail two months in advance. If you don't receive a letter, please call our office to schedule the follow-up appointment.  

## 2013-03-11 ENCOUNTER — Encounter: Payer: Self-pay | Admitting: Cardiology

## 2013-03-17 ENCOUNTER — Ambulatory Visit (INDEPENDENT_AMBULATORY_CARE_PROVIDER_SITE_OTHER): Payer: Medicare Other | Admitting: Pharmacist

## 2013-03-17 DIAGNOSIS — I4891 Unspecified atrial fibrillation: Secondary | ICD-10-CM

## 2013-03-17 LAB — POCT INR: INR: 1.9

## 2013-03-31 ENCOUNTER — Other Ambulatory Visit: Payer: Self-pay | Admitting: Orthopedic Surgery

## 2013-03-31 ENCOUNTER — Ambulatory Visit
Admission: RE | Admit: 2013-03-31 | Discharge: 2013-03-31 | Disposition: A | Discharge status: Home / Self Care | Payer: Commercial Managed Care - HMO | Source: Ambulatory Visit | Attending: Orthopedic Surgery | Admitting: Orthopedic Surgery

## 2013-03-31 DIAGNOSIS — T148XXA Other injury of unspecified body region, initial encounter: Secondary | ICD-10-CM

## 2013-04-01 ENCOUNTER — Ambulatory Visit (INDEPENDENT_AMBULATORY_CARE_PROVIDER_SITE_OTHER): Payer: Medicare Other | Admitting: Pharmacist

## 2013-04-01 DIAGNOSIS — I4891 Unspecified atrial fibrillation: Secondary | ICD-10-CM

## 2013-04-09 ENCOUNTER — Other Ambulatory Visit: Payer: Self-pay

## 2013-04-16 ENCOUNTER — Other Ambulatory Visit: Payer: Self-pay

## 2013-04-16 MED ORDER — METOPROLOL SUCCINATE ER 25 MG PO TB24: 25.0000 mg | ORAL_TABLET | Freq: Every day | ORAL | Status: DC

## 2013-04-22 ENCOUNTER — Ambulatory Visit (INDEPENDENT_AMBULATORY_CARE_PROVIDER_SITE_OTHER): Payer: Medicare Other | Admitting: Pharmacist

## 2013-04-22 DIAGNOSIS — I4891 Unspecified atrial fibrillation: Secondary | ICD-10-CM

## 2013-05-20 ENCOUNTER — Ambulatory Visit (INDEPENDENT_AMBULATORY_CARE_PROVIDER_SITE_OTHER): Payer: Medicare Other | Admitting: Pharmacist

## 2013-05-20 DIAGNOSIS — I4891 Unspecified atrial fibrillation: Secondary | ICD-10-CM

## 2013-06-05 ENCOUNTER — Telehealth: Payer: Self-pay | Admitting: Cardiology

## 2013-06-05 ENCOUNTER — Encounter: Payer: Self-pay | Admitting: Internal Medicine

## 2013-06-05 ENCOUNTER — Ambulatory Visit (INDEPENDENT_AMBULATORY_CARE_PROVIDER_SITE_OTHER): Payer: Medicare HMO | Admitting: *Deleted

## 2013-06-05 DIAGNOSIS — I4891 Unspecified atrial fibrillation: Secondary | ICD-10-CM

## 2013-06-05 LAB — MDC_IDC_ENUM_SESS_TYPE_REMOTE
Battery Voltage: 2.79 V
Brady Statistic AP VP Percent: 3 %
Brady Statistic AP VS Percent: 88 %
Brady Statistic AS VP Percent: 0 %
Brady Statistic AS VS Percent: 9 %
Date Time Interrogation Session: 20150102202815
Lead Channel Impedance Value: 491 Ohm
Lead Channel Impedance Value: 504 Ohm
Lead Channel Pacing Threshold Amplitude: 0.625 V
Lead Channel Pacing Threshold Pulse Width: 0.4 ms
Lead Channel Pacing Threshold Pulse Width: 0.4 ms
Lead Channel Sensing Intrinsic Amplitude: 16 mV
Lead Channel Setting Pacing Amplitude: 2.75 V
Lead Channel Setting Sensing Sensitivity: 4 mV
MDC IDC MSMT BATTERY IMPEDANCE: 131 Ohm
MDC IDC MSMT BATTERY REMAINING LONGEVITY: 143 mo
MDC IDC MSMT LEADCHNL RV PACING THRESHOLD AMPLITUDE: 1.375 V
MDC IDC SET LEADCHNL RA PACING AMPLITUDE: 2 V
MDC IDC SET LEADCHNL RV PACING PULSEWIDTH: 0.4 ms

## 2013-06-05 NOTE — Telephone Encounter (Signed)
Spoke w/pt in regards to transmission not going thru. Pt never got an orange light when trying to send transmission. Instructed to pt to let keep sending until pt saw a green light to bulls eye or orange light which means problem.  Pt will resend transmission this weekend.

## 2013-06-05 NOTE — Telephone Encounter (Signed)
New Problem:  Pt is requesting someone call him and instruct him on how to send in a remote transmission. Pt states he is having difficulty sending in his transmission.Marland KitchenMarland KitchenMarland Kitchen

## 2013-06-10 ENCOUNTER — Ambulatory Visit (INDEPENDENT_AMBULATORY_CARE_PROVIDER_SITE_OTHER): Payer: Medicare Other | Admitting: Cardiovascular Disease

## 2013-06-10 DIAGNOSIS — I4891 Unspecified atrial fibrillation: Secondary | ICD-10-CM

## 2013-06-10 LAB — POCT INR: INR: 2.3

## 2013-06-12 ENCOUNTER — Other Ambulatory Visit: Payer: Self-pay | Admitting: Orthopedic Surgery

## 2013-06-12 DIAGNOSIS — M546 Pain in thoracic spine: Secondary | ICD-10-CM

## 2013-06-16 ENCOUNTER — Ambulatory Visit
Admission: RE | Admit: 2013-06-16 | Discharge: 2013-06-16 | Disposition: A | Discharge status: Home / Self Care | Payer: Commercial Managed Care - HMO | Source: Ambulatory Visit | Attending: Orthopedic Surgery | Admitting: Orthopedic Surgery

## 2013-06-16 VITALS — BP 119/68 | HR 83

## 2013-06-16 DIAGNOSIS — M546 Pain in thoracic spine: Secondary | ICD-10-CM

## 2013-06-16 MED ORDER — MEPERIDINE HCL 100 MG/ML IJ SOLN
50.0000 mg | Freq: Once | INTRAMUSCULAR | Status: AC
  Administered 2013-06-16: 50 mg via INTRAMUSCULAR

## 2013-06-16 MED ORDER — DIAZEPAM 5 MG PO TABS
5.0000 mg | ORAL_TABLET | Freq: Once | ORAL | Status: AC
  Administered 2013-06-16: 5 mg via ORAL

## 2013-06-16 MED ORDER — ONDANSETRON HCL 4 MG/2ML IJ SOLN
4.0000 mg | Freq: Once | INTRAMUSCULAR | Status: AC
  Administered 2013-06-16: 4 mg via INTRAMUSCULAR

## 2013-06-16 MED ORDER — IOHEXOL 300 MG/ML  SOLN
10.0000 mL | Freq: Once | INTRAMUSCULAR | Status: AC | PRN
  Administered 2013-06-16: 10 mL via EPIDURAL

## 2013-06-16 NOTE — Discharge Instructions (Signed)
Myelogram Discharge Instructions  1. Go home and rest quietly for the next 24 hours.  It is important to lie flat for the next 24 hours.  Get up only to go to the restroom.  You may lie in the bed or on a couch on your back, your stomach, your left side or your right side.  You may have one pillow under your head.  You may have pillows between your knees while you are on your side or under your knees while you are on your back.  2. DO NOT drive today.  Recline the seat as far back as it will go, while still wearing your seat belt, on the way home.  3. You may get up to go to the bathroom as needed.  You may sit up for 10 minutes to eat.  You may resume your normal diet and medications unless otherwise indicated.  Drink plenty of extra fluids today and tomorrow.  4. The incidence of a spinal headache with nausea and/or vomiting is about 5% (one in 20 patients).  If you develop a headache, lie flat and drink plenty of fluids until the headache goes away.  Caffeinated beverages may be helpful.  If you develop severe nausea and vomiting or a headache that does not go away with flat bed rest, call [phone].  5. You may resume normal activities after your 24 hours of bed rest is over; however, do not exert yourself strongly or do any heavy lifting tomorrow.  6. Call your physician for a follow-up appointment.   You may resume Warfarin/Coumadin today.  You may resume Cymbalta on Wednesday, June 17, 2013 after 11:00a.m.

## 2013-06-16 NOTE — Progress Notes (Signed)
Patient states he has been off Cymbalta at least two days.  INR 1.2.  jkl

## 2013-06-17 ENCOUNTER — Encounter: Payer: Self-pay | Admitting: *Deleted

## 2013-07-07 ENCOUNTER — Encounter: Payer: Self-pay | Admitting: Cardiology

## 2013-07-07 ENCOUNTER — Ambulatory Visit (INDEPENDENT_AMBULATORY_CARE_PROVIDER_SITE_OTHER): Payer: Medicare HMO | Admitting: Cardiology

## 2013-07-07 VITALS — BP 120/90 | HR 85 | Ht 69.0 in | Wt 179.0 lb

## 2013-07-07 DIAGNOSIS — I480 Paroxysmal atrial fibrillation: Secondary | ICD-10-CM

## 2013-07-07 DIAGNOSIS — R0989 Other specified symptoms and signs involving the circulatory and respiratory systems: Secondary | ICD-10-CM | POA: Insufficient documentation

## 2013-07-07 DIAGNOSIS — I2510 Atherosclerotic heart disease of native coronary artery without angina pectoris: Secondary | ICD-10-CM

## 2013-07-07 DIAGNOSIS — I4891 Unspecified atrial fibrillation: Secondary | ICD-10-CM

## 2013-07-07 DIAGNOSIS — R55 Syncope and collapse: Secondary | ICD-10-CM

## 2013-07-07 DIAGNOSIS — Z8679 Personal history of other diseases of the circulatory system: Secondary | ICD-10-CM

## 2013-07-07 MED ORDER — METOPROLOL SUCCINATE ER 25 MG PO TB24: 25.0000 mg | ORAL_TABLET | Freq: Every day | ORAL | Status: DC

## 2013-07-07 NOTE — Patient Instructions (Signed)
Your physician has requested that you have a carotid duplex. This test is an ultrasound of the carotid arteries in your neck. It looks at blood flow through these arteries that supply the brain with blood. Allow one hour for this exam. There are no restrictions or special instructions.  Your physician recommends that you continue on your current medications as directed. Please refer to the Current Medication list given to you today.  Your physician wants you to follow-up in: 4 month ov You will receive a reminder letter in the mail two months in advance. If you don't receive a letter, please call our office to schedule the follow-up appointment.  

## 2013-07-07 NOTE — Assessment & Plan Note (Signed)
The patient has not been having any recurrent chest discomfort or angina pectoris.  He has not had to take any sublingual nitroglycerin.  His exercise tolerance is stable.  He does not do too much exercise because of his chronic back problems.

## 2013-07-07 NOTE — Assessment & Plan Note (Signed)
Patient has not had any recurrent episodes of profound dizziness or syncope.

## 2013-07-07 NOTE — Assessment & Plan Note (Signed)
The patient is on long-term Coumadin which is drawn at [facility] and results are sent to the Coumadin clinic here.  He has not been aware of any recurrent atrial fibrillation or tachycardia

## 2013-07-07 NOTE — Progress Notes (Signed)
[name] Date of Birth:  Aug 17, 1928 [address] [place],   [address] [phone]         Fax   [phone]  History of Present Illness: This pleasant 80 year old gentleman is seen for a scheduled followup office visit.[name] He has a history of tachybradycardia syndrome and paroxysmal atrial fibrillation. He has a dual-chamber pacemaker. His pacemaker is followed by Dr. Lovena Le. He is not having any symptoms from his heart recently. He has a history of hypercholesterolemia. The patient is on long-term Coumadin anticoagulation because of his paroxysmal A. fib. He is maintaining normal sinus rhythm.  The patient has been off amiodarone since last year because of question of pulmonary fibrosis.  A recent chest x-ray showed no change in the amount of fibrosis from last year.  The patient had an echocardiogram 04/11/12 showed an ejection fraction of 55-65% with grade 1 diastolic dysfunction.  His last nuclear stress test was an adenosine study on 04/21/08 which showed an ejection fraction of 61% and no ischemia. Since we last saw him he was admitted by Dr. Lynann Bologna and underwent successful back surgery with insertion of rods.  It has relieved his low back pain.  Current Outpatient Prescriptions  Medication Sig Dispense Refill  . escitalopram (LEXAPRO) 20 MG tablet Take 20 mg by mouth daily.      [name] gabapentin (NEURONTIN) 300 MG capsule Take 1 capsule (300 mg total) by mouth 3 (three) times daily.  90 capsule  1  . HYDROcodone-acetaminophen (NORCO/VICODIN) 5-325 MG per tablet Take 1 tablet by mouth every 4 (four) hours as needed for pain.      . methocarbamol (ROBAXIN) 500 MG tablet       . metoprolol succinate (TOPROL-XL) 25 MG 24 hr tablet Take 1 tablet (25 mg total) by mouth daily.  90 tablet  3  . nitroGLYCERIN (NITROSTAT) 0.4 MG SL tablet Place 1 tablet (0.4 mg total) under the tongue every 5 (five) minutes as needed for chest pain.  25 tablet  prn  . simvastatin  (ZOCOR) 10 MG tablet Take 10 mg by mouth at bedtime.      [name] tiotropium (SPIRIVA) 18 MCG inhalation capsule Place 18 mcg into inhaler and inhale daily as needed (for shortness of breath).      . warfarin (COUMADIN) 5 MG tablet Take 1 tablet (5 mg total) by mouth as directed.  90 tablet  1  . zolpidem (AMBIEN) 10 MG tablet Take 10 mg by mouth at bedtime.        No current facility-administered medications for this visit.    Allergies  Allergen Reactions  . Codeine Nausea And Vomiting  . Crestor [Rosuvastatin Calcium] Other (See Comments)    Leg cramps    Patient Active Problem List   Diagnosis Date Noted  . Right carotid bruit 07/07/2013  . COPD (chronic obstructive pulmonary disease) 06/05/2012  . Interstitial lung disease 01/22/2012  . Lumbar compression fracture 08/24/2011  . Dyslipidemia 05/02/2011  . Low back pain 05/02/2011  . Hx of bladder cancer 01/01/2011  . A-fib 11/15/2010  . Atrial fibrillation 08/25/2010  . HYPERLIPIDEMIA 11/18/2009  . CORONARY ARTERY DISEASE 11/18/2009  . BRADYCARDIA 11/18/2009  . HYPOTENSION, ORTHOSTATIC 11/18/2009  . SYNCOPE 11/18/2009  . CHEST PAIN 11/18/2009  . ABDOMINAL AORTIC ANEURYSM, HX OF 11/18/2009  . ATRIAL FIBRILLATION, HX OF 11/18/2009  . PACEMAKER, PERMANENT 11/18/2009    History  Smoking status  . Former Smoker -- 3.00 packs/day for 50 years  .  Types: Cigarettes  . Quit date: 06/04/1978  Smokeless tobacco  . Never Used    History  Alcohol Use No    Comment: rare "once a month"    Family History  Problem Relation Age of Onset  . Cancer Father     stomach  . Cancer Sister     lung    Review of Systems: Constitutional: no fever chills diaphoresis or fatigue or change in weight.  Head and neck: no hearing loss, no epistaxis, no photophobia or visual disturbance. Respiratory: No cough, shortness of breath or wheezing. Cardiovascular: No chest pain peripheral edema, palpitations. Gastrointestinal: No abdominal  distention, no abdominal pain, no change in bowel habits hematochezia or melena. Genitourinary: No dysuria, no frequency, no urgency, no nocturia. Musculoskeletal:No arthralgias, no back pain, no gait disturbance or myalgias. Neurological: No dizziness, no headaches, no numbness, no seizures, no syncope, no weakness, no tremors. Hematologic: No lymphadenopathy, no easy bruising. Psychiatric: No confusion, no hallucinations, no sleep disturbance.    Physical Exam: Filed Vitals:   07/07/13 1146  BP: 120/90  Pulse: 85   general appearance reveals a well-developed well-nourished gentleman in no distress.The head and neck exam reveals pupils equal and reactive.  Extraocular movements are full.  There is no scleral icterus.  The mouth and pharynx are normal.  The neck is supple.  The carotids reveal no bruits.  The jugular venous pressure is normal.  The  thyroid is not enlarged.  There is no lymphadenopathy.  The chest reveals scattered rhonchi.  There are no rales or rhonchi.  Expansion of the chest is symmetrical.  The precordium is quiet.  The first heart sound is normal.  The second heart sound is physiologically split.  There is no murmur gallop rub or click.  There is no abnormal lift or heave.  The abdomen is soft and nontender.  The bowel sounds are normal.  The liver and spleen are not enlarged.  There are no abdominal masses.  There are no abdominal bruits.  Extremities reveal good pedal pulses.  There is no phlebitis or edema.  There is no cyanosis or clubbing.  Strength is normal and symmetrical in all extremities.  There is no lateralizing weakness.  There are no sensory deficits.  The skin is warm and dry.  There is no rash.  EKG shows an atrial paced rhythm unchanged from 10/29/12  Assessment / Plan: Continue same medication.  Return for carotid duplex ultrasound. Return in 4 months for followup office visit

## 2013-07-07 NOTE — Assessment & Plan Note (Signed)
On exam today he is noted to have an asymptomatic high pitched right carotid bruit of moderate intensity.  I do not find any prior carotid ultrasound results in his chart.  We will get a carotid duplex to evaluate further.  He denies any TIA symptoms such as amaurosis fugax or expressive aphasia or transient hemiparesis

## 2013-07-08 ENCOUNTER — Ambulatory Visit (INDEPENDENT_AMBULATORY_CARE_PROVIDER_SITE_OTHER): Payer: Medicare HMO | Admitting: Pharmacist

## 2013-07-08 DIAGNOSIS — I4891 Unspecified atrial fibrillation: Secondary | ICD-10-CM

## 2013-07-08 LAB — POCT INR: INR: 2.2

## 2013-07-20 ENCOUNTER — Encounter (HOSPITAL_COMMUNITY): Payer: Medicare HMO

## 2013-07-28 ENCOUNTER — Encounter (HOSPITAL_COMMUNITY): Payer: Medicare HMO

## 2013-08-05 ENCOUNTER — Ambulatory Visit (HOSPITAL_COMMUNITY): Payer: Medicare HMO | Attending: Internal Medicine

## 2013-08-05 ENCOUNTER — Ambulatory Visit (INDEPENDENT_AMBULATORY_CARE_PROVIDER_SITE_OTHER): Payer: Medicare HMO | Admitting: Cardiovascular Disease

## 2013-08-05 ENCOUNTER — Encounter: Payer: Self-pay | Admitting: Internal Medicine

## 2013-08-05 DIAGNOSIS — I4891 Unspecified atrial fibrillation: Secondary | ICD-10-CM

## 2013-08-05 DIAGNOSIS — I70209 Unspecified atherosclerosis of native arteries of extremities, unspecified extremity: Secondary | ICD-10-CM | POA: Insufficient documentation

## 2013-08-05 DIAGNOSIS — I6529 Occlusion and stenosis of unspecified carotid artery: Secondary | ICD-10-CM

## 2013-08-05 DIAGNOSIS — R0989 Other specified symptoms and signs involving the circulatory and respiratory systems: Secondary | ICD-10-CM

## 2013-08-05 LAB — POCT INR: INR: 2.8

## 2013-08-07 ENCOUNTER — Telehealth: Payer: Self-pay | Admitting: *Deleted

## 2013-08-07 NOTE — Telephone Encounter (Signed)
Message copied by Earvin Hansen on Fri Aug 07, 2013  1:39 PM ------      Message from: Darlin Coco      Created: Thu Aug 06, 2013  8:43 PM       Please report.  The carotid study was good. Prior carotid endarterectomy is widely patent. ------

## 2013-08-07 NOTE — Telephone Encounter (Signed)
Advised patient

## 2013-09-07 ENCOUNTER — Ambulatory Visit (INDEPENDENT_AMBULATORY_CARE_PROVIDER_SITE_OTHER): Payer: Medicare HMO | Admitting: *Deleted

## 2013-09-07 DIAGNOSIS — I498 Other specified cardiac arrhythmias: Secondary | ICD-10-CM

## 2013-09-07 DIAGNOSIS — Z950 Presence of cardiac pacemaker: Secondary | ICD-10-CM

## 2013-09-09 LAB — MDC_IDC_ENUM_SESS_TYPE_REMOTE
Battery Impedance: 155 Ohm
Battery Remaining Longevity: 135 mo
Battery Voltage: 2.79 V
Brady Statistic AP VS Percent: 88 %
Lead Channel Impedance Value: 498 Ohm
Lead Channel Pacing Threshold Pulse Width: 0.4 ms
Lead Channel Pacing Threshold Pulse Width: 0.4 ms
Lead Channel Setting Pacing Amplitude: 2 V
Lead Channel Setting Pacing Amplitude: 3.75 V
Lead Channel Setting Pacing Pulse Width: 0.4 ms
Lead Channel Setting Sensing Sensitivity: 2.8 mV
MDC IDC MSMT LEADCHNL RA PACING THRESHOLD AMPLITUDE: 0.5 V
MDC IDC MSMT LEADCHNL RV IMPEDANCE VALUE: 580 Ohm
MDC IDC MSMT LEADCHNL RV PACING THRESHOLD AMPLITUDE: 1.875 V
MDC IDC MSMT LEADCHNL RV SENSING INTR AMPL: 5.6 mV
MDC IDC SESS DTM: 20150406160041
MDC IDC STAT BRADY AP VP PERCENT: 2 %
MDC IDC STAT BRADY AS VP PERCENT: 0 %
MDC IDC STAT BRADY AS VS PERCENT: 9 %

## 2013-09-16 ENCOUNTER — Ambulatory Visit (INDEPENDENT_AMBULATORY_CARE_PROVIDER_SITE_OTHER): Payer: Medicare HMO | Admitting: Cardiology

## 2013-09-16 DIAGNOSIS — I4891 Unspecified atrial fibrillation: Secondary | ICD-10-CM

## 2013-09-16 LAB — POCT INR: INR: 2.2

## 2013-09-17 ENCOUNTER — Encounter: Payer: Self-pay | Admitting: *Deleted

## 2013-09-22 ENCOUNTER — Other Ambulatory Visit: Payer: Self-pay | Admitting: Orthopedic Surgery

## 2013-09-22 DIAGNOSIS — M545 Low back pain, unspecified: Secondary | ICD-10-CM

## 2013-09-29 ENCOUNTER — Encounter: Payer: Self-pay | Admitting: Internal Medicine

## 2013-10-02 ENCOUNTER — Inpatient Hospital Stay (HOSPITAL_COMMUNITY)
Admission: EM | Admit: 2013-10-02 | Discharge: 2013-10-06 | DRG: 871 | Disposition: A | Discharge status: Home Health | Payer: Medicare HMO | Attending: Internal Medicine | Admitting: Internal Medicine

## 2013-10-02 ENCOUNTER — Ambulatory Visit
Admission: RE | Admit: 2013-10-02 | Discharge: 2013-10-02 | Disposition: A | Discharge status: Home / Self Care | Payer: Commercial Managed Care - HMO | Source: Ambulatory Visit | Attending: Orthopedic Surgery | Admitting: Orthopedic Surgery

## 2013-10-02 ENCOUNTER — Emergency Department (HOSPITAL_COMMUNITY): Payer: Medicare HMO

## 2013-10-02 ENCOUNTER — Encounter (HOSPITAL_COMMUNITY): Payer: Self-pay | Admitting: Emergency Medicine

## 2013-10-02 ENCOUNTER — Encounter: Payer: Self-pay | Admitting: Cardiology

## 2013-10-02 VITALS — BP 119/73 | HR 80

## 2013-10-02 DIAGNOSIS — I4891 Unspecified atrial fibrillation: Secondary | ICD-10-CM

## 2013-10-02 DIAGNOSIS — Z8546 Personal history of malignant neoplasm of prostate: Secondary | ICD-10-CM

## 2013-10-02 DIAGNOSIS — Z8673 Personal history of transient ischemic attack (TIA), and cerebral infarction without residual deficits: Secondary | ICD-10-CM

## 2013-10-02 DIAGNOSIS — N390 Urinary tract infection, site not specified: Secondary | ICD-10-CM | POA: Diagnosis present

## 2013-10-02 DIAGNOSIS — I359 Nonrheumatic aortic valve disorder, unspecified: Secondary | ICD-10-CM | POA: Diagnosis present

## 2013-10-02 DIAGNOSIS — R21 Rash and other nonspecific skin eruption: Secondary | ICD-10-CM | POA: Diagnosis present

## 2013-10-02 DIAGNOSIS — Z8551 Personal history of malignant neoplasm of bladder: Secondary | ICD-10-CM

## 2013-10-02 DIAGNOSIS — E875 Hyperkalemia: Secondary | ICD-10-CM | POA: Diagnosis present

## 2013-10-02 DIAGNOSIS — A409 Streptococcal sepsis, unspecified: Principal | ICD-10-CM | POA: Diagnosis present

## 2013-10-02 DIAGNOSIS — M5137 Other intervertebral disc degeneration, lumbosacral region: Secondary | ICD-10-CM | POA: Diagnosis present

## 2013-10-02 DIAGNOSIS — F329 Major depressive disorder, single episode, unspecified: Secondary | ICD-10-CM | POA: Diagnosis present

## 2013-10-02 DIAGNOSIS — A419 Sepsis, unspecified organism: Secondary | ICD-10-CM

## 2013-10-02 DIAGNOSIS — M545 Low back pain, unspecified: Secondary | ICD-10-CM

## 2013-10-02 DIAGNOSIS — Z9849 Cataract extraction status, unspecified eye: Secondary | ICD-10-CM

## 2013-10-02 DIAGNOSIS — J449 Chronic obstructive pulmonary disease, unspecified: Secondary | ICD-10-CM

## 2013-10-02 DIAGNOSIS — Z87891 Personal history of nicotine dependence: Secondary | ICD-10-CM

## 2013-10-02 DIAGNOSIS — M51379 Other intervertebral disc degeneration, lumbosacral region without mention of lumbar back pain or lower extremity pain: Secondary | ICD-10-CM | POA: Diagnosis present

## 2013-10-02 DIAGNOSIS — I252 Old myocardial infarction: Secondary | ICD-10-CM

## 2013-10-02 DIAGNOSIS — I714 Abdominal aortic aneurysm, without rupture, unspecified: Secondary | ICD-10-CM | POA: Diagnosis present

## 2013-10-02 DIAGNOSIS — J154 Pneumonia due to other streptococci: Secondary | ICD-10-CM | POA: Diagnosis present

## 2013-10-02 DIAGNOSIS — F3289 Other specified depressive episodes: Secondary | ICD-10-CM | POA: Diagnosis present

## 2013-10-02 DIAGNOSIS — F411 Generalized anxiety disorder: Secondary | ICD-10-CM | POA: Diagnosis present

## 2013-10-02 DIAGNOSIS — I2510 Atherosclerotic heart disease of native coronary artery without angina pectoris: Secondary | ICD-10-CM | POA: Diagnosis present

## 2013-10-02 DIAGNOSIS — Z8679 Personal history of other diseases of the circulatory system: Secondary | ICD-10-CM

## 2013-10-02 DIAGNOSIS — H9190 Unspecified hearing loss, unspecified ear: Secondary | ICD-10-CM | POA: Diagnosis present

## 2013-10-02 DIAGNOSIS — Z79899 Other long term (current) drug therapy: Secondary | ICD-10-CM

## 2013-10-02 DIAGNOSIS — A491 Streptococcal infection, unspecified site: Secondary | ICD-10-CM

## 2013-10-02 DIAGNOSIS — Z85828 Personal history of other malignant neoplasm of skin: Secondary | ICD-10-CM

## 2013-10-02 DIAGNOSIS — Z981 Arthrodesis status: Secondary | ICD-10-CM

## 2013-10-02 DIAGNOSIS — G8929 Other chronic pain: Secondary | ICD-10-CM | POA: Diagnosis present

## 2013-10-02 DIAGNOSIS — J189 Pneumonia, unspecified organism: Secondary | ICD-10-CM

## 2013-10-02 DIAGNOSIS — I10 Essential (primary) hypertension: Secondary | ICD-10-CM | POA: Diagnosis present

## 2013-10-02 DIAGNOSIS — M810 Age-related osteoporosis without current pathological fracture: Secondary | ICD-10-CM | POA: Diagnosis present

## 2013-10-02 DIAGNOSIS — Z950 Presence of cardiac pacemaker: Secondary | ICD-10-CM

## 2013-10-02 DIAGNOSIS — Z951 Presence of aortocoronary bypass graft: Secondary | ICD-10-CM

## 2013-10-02 DIAGNOSIS — K219 Gastro-esophageal reflux disease without esophagitis: Secondary | ICD-10-CM | POA: Diagnosis present

## 2013-10-02 LAB — PROTIME-INR: INR: 1.2 — AB (ref 0.9–1.1)

## 2013-10-02 LAB — I-STAT CG4 LACTIC ACID, ED: Lactic Acid, Venous: 1.44 mmol/L (ref 0.5–2.2)

## 2013-10-02 MED ORDER — ONDANSETRON HCL 4 MG/2ML IJ SOLN
4.0000 mg | Freq: Once | INTRAMUSCULAR | Status: AC
  Administered 2013-10-03: 4 mg via INTRAVENOUS
  Filled 2013-10-02: qty 2

## 2013-10-02 MED ORDER — MEPERIDINE HCL 100 MG/ML IJ SOLN
50.0000 mg | Freq: Once | INTRAMUSCULAR | Status: AC
  Administered 2013-10-02: 50 mg via INTRAMUSCULAR

## 2013-10-02 MED ORDER — LEVOFLOXACIN IN D5W 750 MG/150ML IV SOLN: 750.0000 mg | Freq: Once | INTRAVENOUS | Status: DC

## 2013-10-02 MED ORDER — IOHEXOL 180 MG/ML  SOLN
18.0000 mL | Freq: Once | INTRAMUSCULAR | Status: AC | PRN
  Administered 2013-10-02: 18 mL via INTRATHECAL

## 2013-10-02 MED ORDER — DIAZEPAM 5 MG PO TABS
5.0000 mg | ORAL_TABLET | Freq: Once | ORAL | Status: AC
  Administered 2013-10-02: 5 mg via ORAL

## 2013-10-02 MED ORDER — SODIUM CHLORIDE 0.9 % IV BOLUS (SEPSIS)
1000.0000 mL | Freq: Once | INTRAVENOUS | Status: AC
  Administered 2013-10-03: 1000 mL via INTRAVENOUS

## 2013-10-02 MED ORDER — ONDANSETRON HCL 4 MG/2ML IJ SOLN: 4.0000 mg | Freq: Four times a day (QID) | INTRAMUSCULAR | Status: DC | PRN

## 2013-10-02 MED ORDER — ONDANSETRON HCL 4 MG/2ML IJ SOLN
4.0000 mg | Freq: Once | INTRAMUSCULAR | Status: AC
  Administered 2013-10-02: 4 mg via INTRAMUSCULAR

## 2013-10-02 MED ORDER — IOHEXOL 300 MG/ML  SOLN
20.0000 mL | INTRAMUSCULAR | Status: AC
  Administered 2013-10-02: 25 mL via ORAL

## 2013-10-02 MED ORDER — PIPERACILLIN-TAZOBACTAM 3.375 G IVPB 30 MIN
3.3750 g | Freq: Once | INTRAVENOUS | Status: AC
Start: 2013-10-03 — End: 2013-10-03
  Administered 2013-10-03: 3.375 g via INTRAVENOUS
  Filled 2013-10-02: qty 50

## 2013-10-02 MED ORDER — VANCOMYCIN HCL IN DEXTROSE 1-5 GM/200ML-% IV SOLN
1000.0000 mg | Freq: Once | INTRAVENOUS | Status: AC
  Administered 2013-10-03: 1000 mg via INTRAVENOUS
  Filled 2013-10-02: qty 200

## 2013-10-02 NOTE — Progress Notes (Signed)
Patient's INR 1.2; scanned into EPIC.  Patient states he has been off Lexapro for at least the past two days.  jkl

## 2013-10-02 NOTE — ED Notes (Signed)
[name] cell [phone]. She reports he had a myelogram today.

## 2013-10-02 NOTE — ED Notes (Signed)
Found that patient had removed nasal cannula.  Explained the need to wear and replaced.  O2 sat 90% on room air.  With nasal cannula in place, now reading 94%.

## 2013-10-02 NOTE — Discharge Instructions (Signed)
Myelogram Discharge Instructions  1. Go home and rest quietly for the next 24 hours.  It is important to lie flat for the next 24 hours.  Get up only to go to the restroom.  You may lie in the bed or on a couch on your back, your stomach, your left side or your right side.  You may have one pillow under your head.  You may have pillows between your knees while you are on your side or under your knees while you are on your back.  2. DO NOT drive today.  Recline the seat as far back as it will go, while still wearing your seat belt, on the way home.  3. You may get up to go to the bathroom as needed.  You may sit up for 10 minutes to eat.  You may resume your normal diet and medications unless otherwise indicated.  Drink plenty of extra fluids today and tomorrow.  4. The incidence of a spinal headache with nausea and/or vomiting is about 5% (one in 20 patients).  If you develop a headache, lie flat and drink plenty of fluids until the headache goes away.  Caffeinated beverages may be helpful.  If you develop severe nausea and vomiting or a headache that does not go away with flat bed rest, call [phone].  5. You may resume normal activities after your 24 hours of bed rest is over; however, do not exert yourself strongly or do any heavy lifting tomorrow.  6. Call your physician for a follow-up appointment.   You may resume Warfarin/Coumadin today.  You may resume Lexapro/Escitalpram on Saturday, Oct 03, 2013 after 11:00a.m.

## 2013-10-02 NOTE — ED Provider Notes (Signed)
CSN: [id number]     Arrival date & time 10/02/13  2119 History   First MD Initiated Contact with Patient 10/02/13 2304     Chief Complaint  Patient presents with  . Back Pain     (Consider location/radiation/quality/duration/timing/severity/associated sxs/prior Treatment) HPI Patient [facility] a [facility] with several days of right flank pain, cough and confusion. He is unable to give a detailed history. He denies any vomiting or diarrhea but admits to nausea. Denies any urinary symptoms. He has a nonproductive cough but denies shortness of breath or chest pain. Past Medical History  Diagnosis Date  . Hyperlipidemia   . Orthostatic hypotension   . Bradycardia   . Syncope     s/p PTVP  . Myocardial infarction     1981  . CAD (coronary artery disease)     prior CABG in 2002 negative Myoview in 2009  . Hypertension   . Stroke     TIA  2007    . GERD (gastroesophageal reflux disease)   . Headache(784.0)   . Arthritis     OSTEOPOROSIS  . Aortic stenosis, mild     by 01/08/08 echo  . Chronic anticoagulation   . Depression   . AAA (abdominal aortic aneurysm)   . Heart attack   . Pacemaker     medtronic pacer  . History of cervical spinal surgery     [place]  [name] HOH (hard of hearing)     BILATERAL HEARING AIDS  . Prostate cancer   . Basal cell adenocarcinoma     'bottom lip" (11/06/2012)  . Bladder cancer   . Attention to urostomy     "since 2006" (11/06/2012)  . Atrial fibrillation     on amiodarone and has PTVP in place  . Exertional shortness of breath   . Chronic radicular pain of lower back   . Anxiety    Past Surgical History  Procedure Laterality Date  . Insert / replace / remove pacemaker      11/2009  DR BRODIE    . Prostatectomy      BLADDER ALSO REMOVED (OSTOMY BAG)  2006  FOR CANCER     . Abdominal aortic aneurysm repair  1999    STENT PLACED    . Cataract extraction w/ intraocular lens  implant, bilateral    . Appendectomy      1959   . Revision urostomy cutaneous    . Revision urostomy cutaneous  2006    Urostomy bag placed on right lower abdomen  . Vertebroplasty  08/22/2011    Procedure: VERTEBROPLASTY;  Surgeon: Winfield Cunas, MD;  Location: [facility];  Service: Neurosurgery;  Laterality: N/A;  Lumbar Two vertebroplasty  . Cholecystectomy      4 YRS AGO  . Posterior lumbar fusion  11/06/2012  . Coronary artery bypass graft  1999    CABG X2  . Skin cancer excision      "bottom lip" (11/06/2012)  . Bladder surgery      "for cancer" (11/06/2012)  . Lumbar laminectomy/decompression microdiscectomy Bilateral 11/06/2012    Procedure: LUMBAR LAMINECTOMY/DECOMPRESSION MICRODISCECTOMY;  Surgeon: Sinclair Ship, MD;  Location: [facility];  Service: Orthopedics;  Laterality: Bilateral;  Lumbar 2-5 decompression   Family History  Problem Relation Age of Onset  . Cancer Father     stomach  . Cancer Sister     lung   History  Substance Use Topics  . Smoking status: Former Smoker --  3.00 packs/day for 50 years    Types: Cigarettes    Quit date: 06/04/1978  . Smokeless tobacco: Never Used  . Alcohol Use: No     Comment: rare "once a month"    Review of Systems  Constitutional: Positive for fever and fatigue.  Respiratory: Positive for cough. Negative for shortness of breath.   Cardiovascular: Negative for chest pain.  Gastrointestinal: Positive for nausea. Negative for vomiting, abdominal pain and diarrhea.  Genitourinary: Positive for flank pain. Negative for dysuria and hematuria.  Musculoskeletal: Positive for back pain. Negative for neck pain and neck stiffness.  Skin: Negative for pallor, rash and wound.  Neurological: Positive for weakness (generalized). Negative for dizziness, light-headedness, numbness and headaches.  Psychiatric/Behavioral: Positive for confusion.  All other systems reviewed and are negative.     Allergies  Codeine and Crestor  Home Medications   Prior to Admission medications    Medication Sig Start Date End Date Taking? Authorizing Provider  escitalopram (LEXAPRO) 20 MG tablet Take 20 mg by mouth daily.   Yes Historical Provider, MD  gabapentin (NEURONTIN) 300 MG capsule Take 1 capsule (300 mg total) by mouth 3 (three) times daily. 11/19/12  Yes Kayla J McKenzie, PA-C  HYDROcodone-acetaminophen (NORCO/VICODIN) 5-325 MG per tablet Take 1 tablet by mouth every 4 (four) hours as needed for pain.   Yes Historical Provider, MD  methocarbamol (ROBAXIN) 500 MG tablet  06/22/13  Yes Historical Provider, MD  metoprolol succinate (TOPROL-XL) 25 MG 24 hr tablet Take 1 tablet (25 mg total) by mouth daily. 07/07/13  Yes Darlin Coco, MD  simvastatin (ZOCOR) 10 MG tablet Take 10 mg by mouth at bedtime.   Yes Historical Provider, MD  tiotropium (SPIRIVA) 18 MCG inhalation capsule Place 18 mcg into inhaler and inhale daily as needed (for shortness of breath).   Yes Historical Provider, MD  warfarin (COUMADIN) 5 MG tablet Take 5 mg by mouth daily. 02/25/13  Yes Darlin Coco, MD  zolpidem (AMBIEN) 10 MG tablet Take 10 mg by mouth at bedtime.  05/02/11  Yes Darlin Coco, MD  nitroGLYCERIN (NITROSTAT) 0.4 MG SL tablet Place 1 tablet (0.4 mg total) under the tongue every 5 (five) minutes as needed for chest pain. 05/06/12 07/07/13  Darlin Coco, MD   BP 92/68  Pulse 104  Temp(Src) 100.1 F (37.8 C) (Oral)  Resp 25  Ht 5\' 9"  (1.753 m)  Wt 185 lb (83.915 kg)  BMI 27.31 kg/m2  SpO2 94% Physical Exam  Nursing note and vitals reviewed. Constitutional: He appears well-developed and well-nourished. No distress.  HENT:  Head: Normocephalic and atraumatic.  Dry mucous membranes  Eyes: EOM are normal. Pupils are equal, round, and reactive to light.  Neck: Normal range of motion. Neck supple.  No meningismus  Cardiovascular: Regular rhythm.   Tachycardia irregular  Pulmonary/Chest: No respiratory distress. He has no wheezes. He has rales.  Tachypnea with scattered basilar rales   Abdominal: Soft. Bowel sounds are normal. He exhibits no distension and no mass. There is no tenderness. There is no rebound and no guarding.  Musculoskeletal: Normal range of motion. He exhibits no edema and no tenderness.  The midline thoracic or lumbar tenderness to palpation. No CVA tenderness bilaterally. The bilateral calf tenderness or swelling.  Neurological: He is alert.  Patient is oriented to self but not to date. Moves all extremities without any focal deficits. Sensation is grossly intact.  Skin: Skin is warm and dry. No rash noted. No erythema.  Psychiatric: He has  a normal mood and affect. His behavior is normal.    ED Course  Procedures (including critical care time) Labs Review Labs Reviewed  URINE CULTURE  CULTURE, BLOOD (ROUTINE X 2)  CULTURE, BLOOD (ROUTINE X 2)  CBC WITH DIFFERENTIAL  COMPREHENSIVE METABOLIC PANEL  TROPONIN I  URINALYSIS, ROUTINE W REFLEX MICROSCOPIC  I-STAT CG4 LACTIC ACID, ED    Imaging Review Ct Lumbar Spine W Contrast  10/02/2013   CLINICAL DATA:  Right greater than left low back pain and diffuse bilateral leg pain and weakness.  EXAM: LUMBAR MYELOGRAM  FLUOROSCOPY TIME:  1 min 23 seconds  PROCEDURE: After thorough discussion of risks and benefits of the procedure including bleeding, infection, injury to nerves, blood vessels, adjacent structures as well as headache and CSF leak, written and oral informed consent was obtained. Consent was obtained by Dr. Logan Bores. Time out form was completed.  Patient was positioned prone on the fluoroscopy table. Local anesthesia was provided with 1% lidocaine without epinephrine after prepped and draped in the usual sterile fashion. Puncture was performed at L4-5 using a 22-gauge spinal needle via a midline approach. Using a single pass through the dura, the needle was placed within the thecal sac, with return of clear CSF. 15 mL of Omnipaque-180 was injected into the thecal sac, with normal opacification of  the nerve roots and cauda equina consistent with free flow within the subarachnoid space.  I personally performed the lumbar puncture and administered the intrathecal contrast. I also personally supervised acquisition of the myelogram images.  TECHNIQUE: Contiguous axial images were obtained through the Lumbar spine after the intrathecal infusion of contrast. Coronal and sagittal reconstructions were obtained of the axial image sets.  COMPARISON:  Lumbar spine CT 11/15/2012  FINDINGS: LUMBAR MYELOGRAM FINDINGS:  Transitional lumbosacral anatomy is again noted. The numbering employed on the prior lumbar spine CT will be continued on this examination, with partial lumbarization of S1. Prior posterior fusion levels are again labeled L4-5. Mild L2 compression fracture status post cement augmentation is noted. There is slight retrolisthesis of L1 on L2, and there is slight anterolisthesis of L3 on L4 and L4 on L5. No significant change is identified with flexion or extension. Small ventral epidural defects are present from L1-2 to L4-5.  CT LUMBAR MYELOGRAM FINDINGS:  Transitional lumbosacral anatomy with numbering as above. Mild L2 compression fracture status post augmentation, unchanged. Prior L4-5 posterior fusion with bilateral transpedicular fixation screws in place. No lucency is identified surrounding the screws to suggest loosening. There is trace retrolisthesis of L1 on L2, and there is trace anterolisthesis of L3 on L4 and L4 on L5. Infrarenal abdominal aorta measures up to 2.9 cm in transverse diameter, unchanged. 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  T12-L1:  Minimal disc bulge without stenosis.  L1-2: Listhesis, mild disc bulge, and mild endplate spurring result in mild bilateral lateral recess narrowing and mild left greater than right neural foraminal narrowing.  L2-3: Prior laminectomy. Mild disc bulge without spinal  stenosis. Disc space narrowing and foraminal osteophytes result in mild-to-moderate bilateral neural foraminal stenosis, unchanged.  L3-4: Prior laminectomy without spinal stenosis. Mild disc bulge, endplate spurring, and facet arthrosis result in mild bilateral neural foraminal narrowing.  L4-5: Prior laminectomy without spinal stenosis. Mild disc bulge and endplate and facet spurring result in mild-to-moderate bilateral neural foraminal narrowing, greater on the right. This is similar to the prior  study.  L5-S1: Circumferential disc bulge and osteophytic ridging and mild facet arthrosis result in mild-to-moderate left lateral recess stenosis and mild-to-moderate bilateral neural foraminal stenosis.  S1-2:  Rudimentary disc without stenosis.  IMPRESSION: 1. Prior L4-5 posterior decompression and fusion with decompressed spinal canal. 2. Multilevel degenerative disc disease and facet arthrosis resulting in mild to moderate bilateral neural foraminal narrowing as above.   Electronically Signed   By: Logan Bores   On: 10/02/2013 17:17   Dg Chest Port 1 View  10/02/2013   CLINICAL DATA:  Cough, fever  EXAM: PORTABLE CHEST - 1 VIEW  COMPARISON:  02/25/2013  FINDINGS: Mild patchy opacity in the right upper lobe, lingula, and left lower lobe, suspicious for pneumonia. No pleural effusion or pneumothorax.  The heart is top-normal in size. Left subclavian pacemaker. Postsurgical changes related to prior CABG.  Cervical spine fixation hardware.  IMPRESSION: Suspected multifocal pneumonia in the right upper lobe, lingula, and left lower lobe.   Electronically Signed   By: Julian Hy M.D.   On: 10/02/2013 23:43   Dg Myelography Lumbar Inj Lumbosacral  10/02/2013   CLINICAL DATA:  Right greater than left low back pain and diffuse bilateral leg pain and weakness.  EXAM: LUMBAR MYELOGRAM  FLUOROSCOPY TIME:  1 min 23 seconds  PROCEDURE: After thorough discussion of risks and benefits of the procedure including  bleeding, infection, injury to nerves, blood vessels, adjacent structures as well as headache and CSF leak, written and oral informed consent was obtained. Consent was obtained by Dr. Logan Bores. Time out form was completed.  Patient was positioned prone on the fluoroscopy table. Local anesthesia was provided with 1% lidocaine without epinephrine after prepped and draped in the usual sterile fashion. Puncture was performed at L4-5 using a 22-gauge spinal needle via a midline approach. Using a single pass through the dura, the needle was placed within the thecal sac, with return of clear CSF. 15 mL of Omnipaque-180 was injected into the thecal sac, with normal opacification of the nerve roots and cauda equina consistent with free flow within the subarachnoid space.  I personally performed the lumbar puncture and administered the intrathecal contrast. I also personally supervised acquisition of the myelogram images.  TECHNIQUE: Contiguous axial images were obtained through the Lumbar spine after the intrathecal infusion of contrast. Coronal and sagittal reconstructions were obtained of the axial image sets.  COMPARISON:  Lumbar spine CT 11/15/2012  FINDINGS: LUMBAR MYELOGRAM FINDINGS:  Transitional lumbosacral anatomy is again noted. The numbering employed on the prior lumbar spine CT will be continued on this examination, with partial lumbarization of S1. Prior posterior fusion levels are again labeled L4-5. Mild L2 compression fracture status post cement augmentation is noted. There is slight retrolisthesis of L1 on L2, and there is slight anterolisthesis of L3 on L4 and L4 on L5. No significant change is identified with flexion or extension. Small ventral epidural defects are present from L1-2 to L4-5.  CT LUMBAR MYELOGRAM FINDINGS:  Transitional lumbosacral anatomy with numbering as above. Mild L2 compression fracture status post augmentation, unchanged. Prior L4-5 posterior fusion with bilateral transpedicular  fixation screws in place. No lucency is identified surrounding the screws to suggest loosening. There is trace retrolisthesis of L1 on L2, and there is trace anterolisthesis of L3 on L4 and L4 on L5. Infrarenal abdominal aorta measures up to 2.9 cm in transverse diameter, unchanged. 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  T12-L1:  Minimal disc bulge without stenosis.  L1-2: Listhesis, mild disc bulge, and mild endplate spurring result in mild bilateral lateral recess narrowing and mild left greater than right neural foraminal narrowing.  L2-3: Prior laminectomy. Mild disc bulge without spinal stenosis. Disc space narrowing and foraminal osteophytes result in mild-to-moderate bilateral neural foraminal stenosis, unchanged.  L3-4: Prior laminectomy without spinal stenosis. Mild disc bulge, endplate spurring, and facet arthrosis result in mild bilateral neural foraminal narrowing.  L4-5: Prior laminectomy without spinal stenosis. Mild disc bulge and endplate and facet spurring result in mild-to-moderate bilateral neural foraminal narrowing, greater on the right. This is similar to the prior study.  L5-S1: Circumferential disc bulge and osteophytic ridging and mild facet arthrosis result in mild-to-moderate left lateral recess stenosis and mild-to-moderate bilateral neural foraminal stenosis.  S1-2:  Rudimentary disc without stenosis.  IMPRESSION: 1. Prior L4-5 posterior decompression and fusion with decompressed spinal canal. 2. Multilevel degenerative disc disease and facet arthrosis resulting in mild to moderate bilateral neural foraminal narrowing as above.   Electronically Signed   By: Logan Bores   On: 10/02/2013 17:17     EKG Interpretation None      Date: 10/03/2013  Rate: 99  Rhythm: normal sinus rhythm  QRS Axis: normal  Intervals: normal  ST/T Wave abnormalities: normal  Conduction Disutrbances:none   Narrative Interpretation:   Old EKG Reviewed: none available   MDM   Final diagnoses:  None    Patient's low back pain appears to be chronic in nature and recently underwent CT lumbar myelogram. He has no new focal weakness or numbness. His febrile illness caused by a multifocal pneumonia. He has elevated white blood cell count. He has a normal lactic acid that his blood pressure dropped into the 90s here in the emergency department. Blood cultures were drawn the patient started on broad-spectrum antibiotics due to concern of developing sepsis. I discussed discussed with the hospitalist and we'll admit to a step down bed.     Julianne Rice, MD 10/03/13 [phone]

## 2013-10-02 NOTE — ED Notes (Signed)
Patient complains of back pain, cough, fever, and nausea for past 2 days. Temp 101.5 with EMS.  Patient is disoriented, currently thinks it's 2008. Rales in bases, O2 sats went from 93-89% so placed on 2L nasal cannula. He is from [place], assisted living. VS: 118/84, P 116 and irregular, Hx of afib, RR 20, CBG 122, no complaints en route.   daughter [phone]. [name] was contacted for history, facility did not know much about patient history.

## 2013-10-03 DIAGNOSIS — A419 Sepsis, unspecified organism: Secondary | ICD-10-CM

## 2013-10-03 DIAGNOSIS — J189 Pneumonia, unspecified organism: Secondary | ICD-10-CM | POA: Diagnosis present

## 2013-10-03 DIAGNOSIS — I4891 Unspecified atrial fibrillation: Secondary | ICD-10-CM

## 2013-10-03 LAB — URINALYSIS, ROUTINE W REFLEX MICROSCOPIC
Bilirubin Urine: NEGATIVE
Glucose, UA: NEGATIVE mg/dL
Hgb urine dipstick: NEGATIVE
Ketones, ur: NEGATIVE mg/dL
NITRITE: POSITIVE — AB
PH: 8.5 — AB (ref 5.0–8.0)
Protein, ur: 30 mg/dL — AB
SPECIFIC GRAVITY, URINE: 1.019 (ref 1.005–1.030)
Urobilinogen, UA: 0.2 mg/dL (ref 0.0–1.0)

## 2013-10-03 LAB — BASIC METABOLIC PANEL
BUN: 21 mg/dL (ref 6–23)
BUN: 21 mg/dL (ref 6–23)
CALCIUM: 8.1 mg/dL — AB (ref 8.4–10.5)
CALCIUM: 8.2 mg/dL — AB (ref 8.4–10.5)
CO2: 21 meq/L (ref 19–32)
CO2: 21 meq/L (ref 19–32)
CREATININE: 1 mg/dL (ref 0.50–1.35)
CREATININE: 1.06 mg/dL (ref 0.50–1.35)
Chloride: 100 mEq/L (ref 96–112)
Chloride: 103 mEq/L (ref 96–112)
GFR calc Af Amer: 78 mL/min — ABNORMAL LOW (ref >90)
GFR calc Af Amer: 73 mL/min — ABNORMAL LOW (ref >90)
GFR calc non Af Amer: 67 mL/min — ABNORMAL LOW (ref >90)
GFR calc non Af Amer: 63 mL/min — ABNORMAL LOW (ref >90)
Glucose, Bld: 149 mg/dL — ABNORMAL HIGH (ref 70–99)
Glucose, Bld: 98 mg/dL (ref 70–99)
Potassium: 6 mEq/L — ABNORMAL HIGH (ref 3.7–5.3)
Potassium: 4.6 mEq/L (ref 3.7–5.3)
Sodium: 135 mEq/L — ABNORMAL LOW (ref 137–147)
Sodium: 139 mEq/L (ref 137–147)

## 2013-10-03 LAB — COMPREHENSIVE METABOLIC PANEL
ALBUMIN: 3.7 g/dL (ref 3.5–5.2)
ALK PHOS: 98 U/L (ref 39–117)
ALT: 10 U/L (ref 0–53)
AST: 15 U/L (ref 0–37)
BUN: 20 mg/dL (ref 6–23)
CO2: 21 mEq/L (ref 19–32)
CREATININE: 0.97 mg/dL (ref 0.50–1.35)
Calcium: 9.4 mg/dL (ref 8.4–10.5)
Chloride: 100 mEq/L (ref 96–112)
GFR calc Af Amer: 86 mL/min — ABNORMAL LOW (ref >90)
GFR calc non Af Amer: 74 mL/min — ABNORMAL LOW (ref >90)
Glucose, Bld: 112 mg/dL — ABNORMAL HIGH (ref 70–99)
POTASSIUM: 4.2 meq/L (ref 3.7–5.3)
Sodium: 138 mEq/L (ref 137–147)
TOTAL PROTEIN: 7.7 g/dL (ref 6.0–8.3)
Total Bilirubin: 0.6 mg/dL (ref 0.3–1.2)

## 2013-10-03 LAB — CBC WITH DIFFERENTIAL/PLATELET
BASOS PCT: 0 % (ref 0–1)
Basophils Absolute: 0 10*3/uL (ref 0.0–0.1)
Eosinophils Absolute: 0 10*3/uL (ref 0.0–0.7)
Eosinophils Relative: 0 % (ref 0–5)
HCT: 40.7 % (ref 39.0–52.0)
HEMOGLOBIN: 13.7 g/dL (ref 13.0–17.0)
Lymphocytes Relative: 5 % — ABNORMAL LOW (ref 12–46)
Lymphs Abs: 1.4 10*3/uL (ref 0.7–4.0)
MCH: 30.8 pg (ref 26.0–34.0)
MCHC: 33.7 g/dL (ref 30.0–36.0)
MCV: 91.5 fL (ref 78.0–100.0)
Monocytes Absolute: 2.5 10*3/uL — ABNORMAL HIGH (ref 0.1–1.0)
Monocytes Relative: 9 % (ref 3–12)
Neutro Abs: 23.7 10*3/uL — ABNORMAL HIGH (ref 1.7–7.7)
Neutrophils Relative %: 86 % — ABNORMAL HIGH (ref 43–77)
Platelets: 245 10*3/uL (ref 150–400)
RBC: 4.45 MIL/uL (ref 4.22–5.81)
RDW: 15.3 % (ref 11.5–15.5)
WBC: 27.6 10*3/uL — ABNORMAL HIGH (ref 4.0–10.5)

## 2013-10-03 LAB — TROPONIN I

## 2013-10-03 LAB — CBC
HCT: 36.2 % — ABNORMAL LOW (ref 39.0–52.0)
Hemoglobin: 12.1 g/dL — ABNORMAL LOW (ref 13.0–17.0)
MCH: 30.8 pg (ref 26.0–34.0)
MCHC: 33.4 g/dL (ref 30.0–36.0)
MCV: 92.1 fL (ref 78.0–100.0)
Platelets: 236 10*3/uL (ref 150–400)
RBC: 3.93 MIL/uL — AB (ref 4.22–5.81)
RDW: 15.4 % (ref 11.5–15.5)
WBC: 30.3 10*3/uL — ABNORMAL HIGH (ref 4.0–10.5)

## 2013-10-03 LAB — APTT: aPTT: 34 seconds (ref 24–37)

## 2013-10-03 LAB — PROTIME-INR
INR: 1.06 (ref 0.00–1.49)
Prothrombin Time: 13.6 seconds (ref 11.6–15.2)

## 2013-10-03 LAB — URINE MICROSCOPIC-ADD ON

## 2013-10-03 LAB — HIV ANTIBODY (ROUTINE TESTING W REFLEX): HIV 1&2 Ab, 4th Generation: NONREACTIVE

## 2013-10-03 LAB — MRSA PCR SCREENING: MRSA by PCR: NEGATIVE

## 2013-10-03 MED ORDER — ESCITALOPRAM OXALATE 20 MG PO TABS
20.0000 mg | ORAL_TABLET | Freq: Every day | ORAL | Status: DC
  Administered 2013-10-03 – 2013-10-06 (×4): 20 mg via ORAL
  Filled 2013-10-03 (×4): qty 1

## 2013-10-03 MED ORDER — METOPROLOL SUCCINATE ER 25 MG PO TB24
25.0000 mg | ORAL_TABLET | Freq: Every day | ORAL | Status: DC
  Administered 2013-10-04 – 2013-10-06 (×3): 25 mg via ORAL
  Filled 2013-10-03 (×4): qty 1

## 2013-10-03 MED ORDER — WARFARIN SODIUM 5 MG PO TABS
5.0000 mg | ORAL_TABLET | Freq: Once | ORAL | Status: AC
  Administered 2013-10-03: 5 mg via ORAL
  Filled 2013-10-03: qty 1

## 2013-10-03 MED ORDER — VANCOMYCIN HCL IN DEXTROSE 750-5 MG/150ML-% IV SOLN
750.0000 mg | Freq: Two times a day (BID) | INTRAVENOUS | Status: DC
  Administered 2013-10-03 – 2013-10-04 (×4): 750 mg via INTRAVENOUS
  Filled 2013-10-03 (×6): qty 150

## 2013-10-03 MED ORDER — FENTANYL CITRATE 0.05 MG/ML IJ SOLN
25.0000 ug | Freq: Once | INTRAMUSCULAR | Status: AC
  Administered 2013-10-03: 25 ug via INTRAVENOUS
  Filled 2013-10-03: qty 2

## 2013-10-03 MED ORDER — ZOLPIDEM TARTRATE 5 MG PO TABS
5.0000 mg | ORAL_TABLET | Freq: Every day | ORAL | Status: DC
  Administered 2013-10-03 – 2013-10-05 (×3): 5 mg via ORAL
  Filled 2013-10-03 (×3): qty 1

## 2013-10-03 MED ORDER — WARFARIN - PHARMACIST DOSING INPATIENT: Freq: Every day | Status: DC

## 2013-10-03 MED ORDER — TIOTROPIUM BROMIDE MONOHYDRATE 18 MCG IN CAPS: 18.0000 ug | ORAL_CAPSULE | Freq: Every day | RESPIRATORY_TRACT | Status: DC | PRN

## 2013-10-03 MED ORDER — SODIUM CHLORIDE 0.9 % IV SOLN: INTRAVENOUS | Status: DC

## 2013-10-03 MED ORDER — ZOLPIDEM TARTRATE 5 MG PO TABS: 5.0000 mg | ORAL_TABLET | Freq: Every day | ORAL | Status: DC

## 2013-10-03 MED ORDER — GABAPENTIN 300 MG PO CAPS
300.0000 mg | ORAL_CAPSULE | Freq: Three times a day (TID) | ORAL | Status: DC
  Administered 2013-10-03 – 2013-10-06 (×11): 300 mg via ORAL
  Filled 2013-10-03 (×12): qty 1

## 2013-10-03 MED ORDER — HYDROCODONE-ACETAMINOPHEN 5-325 MG PO TABS
1.0000 | ORAL_TABLET | ORAL | Status: DC | PRN
  Administered 2013-10-03 – 2013-10-06 (×11): 1 via ORAL
  Filled 2013-10-03 (×11): qty 1

## 2013-10-03 MED ORDER — SIMVASTATIN 10 MG PO TABS
10.0000 mg | ORAL_TABLET | Freq: Every day | ORAL | Status: DC
  Administered 2013-10-03 – 2013-10-05 (×4): 10 mg via ORAL
  Filled 2013-10-03 (×5): qty 1

## 2013-10-03 MED ORDER — ZOLPIDEM TARTRATE 5 MG PO TABS: 10.0000 mg | ORAL_TABLET | Freq: Every day | ORAL | Status: DC

## 2013-10-03 MED ORDER — SODIUM CHLORIDE 0.9 % IV SOLN
INTRAVENOUS | Status: DC
  Administered 2013-10-03: 125 mL/h via INTRAVENOUS
  Administered 2013-10-04: 08:00:00 via INTRAVENOUS

## 2013-10-03 MED ORDER — DEXTROSE 5 % IV SOLN
1.0000 g | INTRAVENOUS | Status: DC
  Administered 2013-10-03: 1 g via INTRAVENOUS
  Filled 2013-10-03: qty 10

## 2013-10-03 MED ORDER — AZITHROMYCIN 500 MG IV SOLR
500.0000 mg | INTRAVENOUS | Status: DC
  Administered 2013-10-03: 500 mg via INTRAVENOUS
  Filled 2013-10-03: qty 500

## 2013-10-03 MED ORDER — PIPERACILLIN-TAZOBACTAM 3.375 G IVPB
3.3750 g | Freq: Three times a day (TID) | INTRAVENOUS | Status: DC
  Administered 2013-10-03 – 2013-10-05 (×7): 3.375 g via INTRAVENOUS
  Filled 2013-10-03 (×9): qty 50

## 2013-10-03 NOTE — Progress Notes (Signed)
[facility] PROGRESS NOTE  Peter Richards:[id number] DOB: 1929-01-27 DOA: 10/02/2013 PCP: Mathews Argyle, MD  Assessment/Plan: 1-PNA, health care associated; continue with Vancomycin and Zosyn. WBC increase to 30. Afebrile this morning. Continue with IV fluids. Monitor for hypotension. Blood culture ordered.   2-Sepsis; continue with IV fluids, IV antibiotics.   3-A fib; Continue with metoprolol. Coumadin per pharmacy.   4-Diarrhea; if persist will check for C diff.   5-UTI. On Zosyn. Follow up urine culture.   6-Hyperkalemia; repeat B-met. Probably hemolysis.   7-Back pain; CT myelogram showed; 1. Prior L4-5 posterior decompression and fusion with decompressed  spinal canal.  Multilevel degenerative disc disease and facet arthrosis resulting in mild to moderate bilateral neural foraminal narrowing  as above.    Code Status: Full Code.  Family Communication: Care discussed with patient.  Disposition Plan: Remain step down.    Consultants:  none  Procedures:  none  Antibiotics:  Vancomycin 5-02  Zosyn 5-02.   HPI/Subjective: Feeling better. Still with back pain. He has chronic back pain, but has been worse for last few days. He relates pain in legs.  He relates occasional cough. Last night he had diarrhea.   Objective: Filed Vitals:   10/03/13 0451  BP: 93/44  Pulse: 70  Temp: 97.5 F (36.4 C)  Resp: 15    Intake/Output Summary (Last 24 hours) at 10/03/13 0738 Last data filed at 10/03/13 0459  Gross per 24 hour  Intake    120 ml  Output    400 ml  Net   -280 ml   Filed Weights   10/02/13 2131 10/03/13 0239  Weight: 83.915 kg (185 lb) 80.4 kg (177 lb 4 oz)    Exam:   General:  Alert , no distress.   Cardiovascular: S 1, S 2 RRR  Respiratory: Crackles bases.   Abdomen: Bs present, soft, NT, urostomy in place.   Musculoskeletal: no edema.   Data Reviewed: Basic Metabolic Panel:  Recent Labs Lab 10/02/13 2348  10/03/13 0201  NA 138 135*  K 4.2 6.0*  CL 100 100  CO2 21 21  GLUCOSE 112* 149*  BUN 20 21  CREATININE 0.97 1.00  CALCIUM 9.4 8.1*   Liver Function Tests:  Recent Labs Lab 10/02/13 2348  AST 15  ALT 10  ALKPHOS 98  BILITOT 0.6  PROT 7.7  ALBUMIN 3.7   No results found for this basename: LIPASE, AMYLASE,  in the last 168 hours No results found for this basename: AMMONIA,  in the last 168 hours CBC:  Recent Labs Lab 10/02/13 2348 10/03/13 0201  WBC 27.6* 30.3*  NEUTROABS 23.7*  --   HGB 13.7 12.1*  HCT 40.7 36.2*  MCV 91.5 92.1  PLT 245 236   Cardiac Enzymes:  Recent Labs Lab 10/02/13 2349  TROPONINI <0.30   BNP (last 3 results) No results found for this basename: PROBNP,  in the last 8760 hours CBG: No results found for this basename: GLUCAP,  in the last 168 hours  Recent Results (from the past 240 hour(s))  MRSA PCR SCREENING     Status: None   Collection Time    10/03/13  2:34 AM      Result Value Ref Range Status   MRSA by PCR NEGATIVE  NEGATIVE Final   Comment:            The GeneXpert MRSA Assay (FDA     approved for NASAL specimens     only), is one  component of a     comprehensive MRSA colonization     surveillance program. It is not     intended to diagnose MRSA     infection nor to guide or     monitor treatment for     MRSA infections.     Studies: Ct Lumbar Spine W Contrast  10/02/2013   CLINICAL DATA:  Right greater than left low back pain and diffuse bilateral leg pain and weakness.  EXAM: LUMBAR MYELOGRAM  FLUOROSCOPY TIME:  1 min 23 seconds  PROCEDURE: After thorough discussion of risks and benefits of the procedure including bleeding, infection, injury to nerves, blood vessels, adjacent structures as well as headache and CSF leak, written and oral informed consent was obtained. Consent was obtained by Dr. Logan Bores. Time out form was completed.  Patient was positioned prone on the fluoroscopy table. Local anesthesia was provided  with 1% lidocaine without epinephrine after prepped and draped in the usual sterile fashion. Puncture was performed at L4-5 using a 22-gauge spinal needle via a midline approach. Using a single pass through the dura, the needle was placed within the thecal sac, with return of clear CSF. 15 mL of Omnipaque-180 was injected into the thecal sac, with normal opacification of the nerve roots and cauda equina consistent with free flow within the subarachnoid space.  I personally performed the lumbar puncture and administered the intrathecal contrast. I also personally supervised acquisition of the myelogram images.  TECHNIQUE: Contiguous axial images were obtained through the Lumbar spine after the intrathecal infusion of contrast. Coronal and sagittal reconstructions were obtained of the axial image sets.  COMPARISON:  Lumbar spine CT 11/15/2012  FINDINGS: LUMBAR MYELOGRAM FINDINGS:  Transitional lumbosacral anatomy is again noted. The numbering employed on the prior lumbar spine CT will be continued on this examination, with partial lumbarization of S1. Prior posterior fusion levels are again labeled L4-5. Mild L2 compression fracture status post cement augmentation is noted. There is slight retrolisthesis of L1 on L2, and there is slight anterolisthesis of L3 on L4 and L4 on L5. No significant change is identified with flexion or extension. Small ventral epidural defects are present from L1-2 to L4-5.  CT LUMBAR MYELOGRAM FINDINGS:  Transitional lumbosacral anatomy with numbering as above. Mild L2 compression fracture status post augmentation, unchanged. Prior L4-5 posterior fusion with bilateral transpedicular fixation screws in place. No lucency is identified surrounding the screws to suggest loosening. There is trace retrolisthesis of L1 on L2, and there is trace anterolisthesis of L3 on L4 and L4 on L5. Infrarenal abdominal aorta measures up to 2.9 cm in transverse diameter, unchanged. Mild aneurysmal dilatation of  the common iliac arteries is also similar to the prior CT, measuring up to 1.9 cm on the right.  T12-L1:  Minimal disc bulge without stenosis.  L1-2: Listhesis, mild disc bulge, and mild endplate spurring result in mild bilateral lateral recess narrowing and mild left greater than right neural foraminal narrowing.  L2-3: Prior laminectomy. Mild disc bulge without spinal stenosis. Disc space narrowing and foraminal osteophytes result in mild-to-moderate bilateral neural foraminal stenosis, unchanged.  L3-4: Prior laminectomy without spinal stenosis. Mild disc bulge, endplate spurring, and facet arthrosis result in mild bilateral neural foraminal narrowing.  L4-5: Prior laminectomy without spinal stenosis. Mild disc bulge and endplate and facet spurring result in mild-to-moderate bilateral neural foraminal narrowing, greater on the right. This is similar to the prior study.  L5-S1: Circumferential disc bulge and osteophytic ridging and mild facet arthrosis  result in mild-to-moderate left lateral recess stenosis and mild-to-moderate bilateral neural foraminal stenosis.  S1-2:  Rudimentary disc without stenosis.  IMPRESSION: 1. Prior L4-5 posterior decompression and fusion with decompressed spinal canal. 2. Multilevel degenerative disc disease and facet arthrosis resulting in mild to moderate bilateral neural foraminal narrowing as above.   Electronically Signed   By: Logan Bores   On: 10/02/2013 17:17   Dg Chest Port 1 View  10/02/2013   CLINICAL DATA:  Cough, fever  EXAM: PORTABLE CHEST - 1 VIEW  COMPARISON:  02/25/2013  FINDINGS: Mild patchy opacity in the right upper lobe, lingula, and left lower lobe, suspicious for pneumonia. No pleural effusion or pneumothorax.  The heart is top-normal in size. Left subclavian pacemaker. Postsurgical changes related to prior CABG.  Cervical spine fixation hardware.  IMPRESSION: Suspected multifocal pneumonia in the right upper lobe, lingula, and left lower lobe.    Electronically Signed   By: Julian Hy M.D.   On: 10/02/2013 23:43   Dg Myelography Lumbar Inj Lumbosacral  10/02/2013   CLINICAL DATA:  Right greater than left low back pain and diffuse bilateral leg pain and weakness.  EXAM: LUMBAR MYELOGRAM  FLUOROSCOPY TIME:  1 min 23 seconds  PROCEDURE: After thorough discussion of risks and benefits of the procedure including bleeding, infection, injury to nerves, blood vessels, adjacent structures as well as headache and CSF leak, written and oral informed consent was obtained. Consent was obtained by Dr. Logan Bores. Time out form was completed.  Patient was positioned prone on the fluoroscopy table. Local anesthesia was provided with 1% lidocaine without epinephrine after prepped and draped in the usual sterile fashion. Puncture was performed at L4-5 using a 22-gauge spinal needle via a midline approach. Using a single pass through the dura, the needle was placed within the thecal sac, with return of clear CSF. 15 mL of Omnipaque-180 was injected into the thecal sac, with normal opacification of the nerve roots and cauda equina consistent with free flow within the subarachnoid space.  I personally performed the lumbar puncture and administered the intrathecal contrast. I also personally supervised acquisition of the myelogram images.  TECHNIQUE: Contiguous axial images were obtained through the Lumbar spine after the intrathecal infusion of contrast. Coronal and sagittal reconstructions were obtained of the axial image sets.  COMPARISON:  Lumbar spine CT 11/15/2012  FINDINGS: LUMBAR MYELOGRAM FINDINGS:  Transitional lumbosacral anatomy is again noted. The numbering employed on the prior lumbar spine CT will be continued on this examination, with partial lumbarization of S1. Prior posterior fusion levels are again labeled L4-5. Mild L2 compression fracture status post cement augmentation is noted. There is slight retrolisthesis of L1 on L2, and there is slight  anterolisthesis of L3 on L4 and L4 on L5. No significant change is identified with flexion or extension. Small ventral epidural defects are present from L1-2 to L4-5.  CT LUMBAR MYELOGRAM FINDINGS:  Transitional lumbosacral anatomy with numbering as above. Mild L2 compression fracture status post augmentation, unchanged. Prior L4-5 posterior fusion with bilateral transpedicular fixation screws in place. No lucency is identified surrounding the screws to suggest loosening. There is trace retrolisthesis of L1 on L2, and there is trace anterolisthesis of L3 on L4 and L4 on L5. Infrarenal abdominal aorta measures up to 2.9 cm in transverse diameter, unchanged. 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  T12-L1:  Minimal disc bulge without stenosis.  L1-2: Listhesis, mild disc bulge, and mild endplate spurring result in mild bilateral lateral recess narrowing and mild left greater than right neural foraminal narrowing.  L2-3: Prior laminectomy. Mild disc bulge without spinal stenosis. Disc space narrowing and foraminal osteophytes result in mild-to-moderate bilateral neural foraminal stenosis, unchanged.  L3-4: Prior laminectomy without spinal stenosis. Mild disc bulge, endplate spurring, and facet arthrosis result in mild bilateral neural foraminal narrowing.  L4-5: Prior laminectomy without spinal stenosis. Mild disc bulge and endplate and facet spurring result in mild-to-moderate bilateral neural foraminal narrowing, greater on the right. This is similar to the prior study.  L5-S1: Circumferential disc bulge and osteophytic ridging and mild facet arthrosis result in mild-to-moderate left lateral recess stenosis and mild-to-moderate bilateral neural foraminal stenosis.  S1-2:  Rudimentary disc without stenosis.  IMPRESSION: 1. Prior L4-5 posterior decompression and fusion with decompressed spinal canal. 2. Multilevel degenerative disc  disease and facet arthrosis resulting in mild to moderate bilateral neural foraminal narrowing as above.   Electronically Signed   By: Logan Bores   On: 10/02/2013 17:17    Scheduled Meds: . escitalopram  20 mg Oral Daily  . gabapentin  300 mg Oral TID  . metoprolol succinate  25 mg Oral Daily  . simvastatin  10 mg Oral QHS  . warfarin  5 mg Oral ONCE-1800  . Warfarin - Pharmacist Dosing Inpatient   Does not apply q1800  . zolpidem  5 mg Oral QHS   Continuous Infusions: . sodium chloride 125 mL/hr at 10/03/13 0600    Principal Problem:   Community acquired pneumonia Active Problems:   ATRIAL FIBRILLATION, HX OF   Sepsis    Time spent: 35 minutes.     [facility] Pager [phone]. If 7PM-7AM, please contact night-coverage at [email or web address], password [email or web address] 10/03/2013, 7:38 AM  LOS: 1 day

## 2013-10-03 NOTE — H&P (Signed)
[facility] History and Physical  Peter Richards [id number] DOB: 10-13-1928 DOA: 10/02/2013  Referring physician: EDP PCP: Mathews Argyle, MD   Chief Complaint: Back pain   HPI: Peter Richards is a 80 y.o. male who presents [facility] the ED from a [facility] with several days of R flank pain, cough, confusion and SOB.  He is presently disoriented and thinks it is 2008.  He does not relate having tried anything for his symptoms.  Incidentally, he did have a myelogram performed today.  Review of Systems: Admits to cough, SOB, back pain, rest of ROS is negative although he is disoriented.  Past Medical History  Diagnosis Date  . Hyperlipidemia   . Orthostatic hypotension   . Bradycardia   . Syncope     s/p PTVP  . Myocardial infarction     1981  . CAD (coronary artery disease)     prior CABG in 2002 negative Myoview in 2009  . Hypertension   . Stroke     TIA  2007    . GERD (gastroesophageal reflux disease)   . Headache(784.0)   . Arthritis     OSTEOPOROSIS  . Aortic stenosis, mild     by 01/08/08 echo  . Chronic anticoagulation   . Depression   . AAA (abdominal aortic aneurysm)   . Heart attack   . Pacemaker     medtronic pacer  . History of cervical spinal surgery     [place]  [name] HOH (hard of hearing)     BILATERAL HEARING AIDS  . Prostate cancer   . Basal cell adenocarcinoma     'bottom lip" (11/06/2012)  . Bladder cancer   . Attention to urostomy     "since 2006" (11/06/2012)  . Atrial fibrillation     on amiodarone and has PTVP in place  . Exertional shortness of breath   . Chronic radicular pain of lower back   . Anxiety    Past Surgical History  Procedure Laterality Date  . Insert / replace / remove pacemaker      11/2009  DR BRODIE    . Prostatectomy      BLADDER ALSO REMOVED (OSTOMY BAG)  2006  FOR CANCER     . Abdominal aortic aneurysm repair  1999    STENT PLACED    . Cataract extraction w/ intraocular lens  implant,  bilateral    . Appendectomy      1959  . Revision urostomy cutaneous    . Revision urostomy cutaneous  2006    Urostomy bag placed on right lower abdomen  . Vertebroplasty  08/22/2011    Procedure: VERTEBROPLASTY;  Surgeon: Winfield Cunas, MD;  Location: [facility];  Service: Neurosurgery;  Laterality: N/A;  Lumbar Two vertebroplasty  . Cholecystectomy      4 YRS AGO  . Posterior lumbar fusion  11/06/2012  . Coronary artery bypass graft  1999    CABG X2  . Skin cancer excision      "bottom lip" (11/06/2012)  . Bladder surgery      "for cancer" (11/06/2012)  . Lumbar laminectomy/decompression microdiscectomy Bilateral 11/06/2012    Procedure: LUMBAR LAMINECTOMY/DECOMPRESSION MICRODISCECTOMY;  Surgeon: Sinclair Ship, MD;  Location: [facility];  Service: Orthopedics;  Laterality: Bilateral;  Lumbar 2-5 decompression   Social History:  reports that he quit smoking about 35 years ago. His smoking use included Cigarettes. He has a 150 pack-year smoking history. He has never used smokeless  tobacco. He reports that he does not drink alcohol or use illicit drugs.  Allergies  Allergen Reactions  . Codeine Nausea And Vomiting  . Crestor [Rosuvastatin Calcium] Other (See Comments)    Leg cramps    Family History  Problem Relation Age of Onset  . Cancer Father     stomach  . Cancer Sister     lung     Prior to Admission medications   Medication Sig Start Date End Date Taking? Authorizing Provider  escitalopram (LEXAPRO) 20 MG tablet Take 20 mg by mouth daily.   Yes Historical Provider, MD  gabapentin (NEURONTIN) 300 MG capsule Take 1 capsule (300 mg total) by mouth 3 (three) times daily. 11/19/12  Yes Kayla J McKenzie, PA-C  HYDROcodone-acetaminophen (NORCO/VICODIN) 5-325 MG per tablet Take 1 tablet by mouth every 4 (four) hours as needed for pain.   Yes Historical Provider, MD  methocarbamol (ROBAXIN) 500 MG tablet  06/22/13  Yes Historical Provider, MD  metoprolol succinate (TOPROL-XL) 25  MG 24 hr tablet Take 1 tablet (25 mg total) by mouth daily. 07/07/13  Yes Darlin Coco, MD  simvastatin (ZOCOR) 10 MG tablet Take 10 mg by mouth at bedtime.   Yes Historical Provider, MD  tiotropium (SPIRIVA) 18 MCG inhalation capsule Place 18 mcg into inhaler and inhale daily as needed (for shortness of breath).   Yes Historical Provider, MD  warfarin (COUMADIN) 5 MG tablet Take 5 mg by mouth daily. 02/25/13  Yes Darlin Coco, MD  zolpidem (AMBIEN) 10 MG tablet Take 10 mg by mouth at bedtime.  05/02/11  Yes Darlin Coco, MD  nitroGLYCERIN (NITROSTAT) 0.4 MG SL tablet Place 1 tablet (0.4 mg total) under the tongue every 5 (five) minutes as needed for chest pain. 05/06/12 07/07/13  Darlin Coco, MD   Physical Exam: Filed Vitals:   10/03/13 0145  BP: 102/49  Pulse: 73  Temp:   Resp: 18    BP 102/49  Pulse 73  Temp(Src) 98 F (36.7 C) (Oral)  Resp 18  Ht 5\' 9"  (1.753 m)  Wt 83.915 kg (185 lb)  BMI 27.31 kg/m2  SpO2 97%  General Appearance:    Alert, oriented, no distress, appears stated age  Head:    Normocephalic, atraumatic  Eyes:    PERRL, EOMI, sclera non-icteric        Nose:   Nares without drainage or epistaxis. Mucosa, turbinates normal  Throat:   Dry mucous membranes. Oropharynx without erythema or exudate.  Neck:   Supple. No carotid bruits.  No thyromegaly.  No lymphadenopathy.   Back:     No CVA tenderness, no spinal tenderness  Lungs:     Tachypnea with rales  Chest wall:    No tenderness to palpitation  Heart:    Regular rate and rhythm without murmurs, gallops, rubs  Abdomen:     Soft, non-tender, nondistended, normal bowel sounds, no organomegaly  Genitalia:    deferred  Rectal:    deferred  Extremities:   No clubbing, cyanosis or edema.  Pulses:   2+ and symmetric all extremities  Skin:   Skin color, texture, turgor normal, no rashes or lesions  Lymph nodes:   Cervical, supraclavicular, and axillary nodes normal  Neurologic:   CNII-XII intact. Normal  strength, sensation and reflexes      throughout    Labs on Admission:  Basic Metabolic Panel:  Recent Labs Lab 10/02/13 2348  NA 138  K 4.2  CL 100  CO2 21  GLUCOSE  112*  BUN 20  CREATININE 0.97  CALCIUM 9.4   Liver Function Tests:  Recent Labs Lab 10/02/13 2348  AST 15  ALT 10  ALKPHOS 98  BILITOT 0.6  PROT 7.7  ALBUMIN 3.7   No results found for this basename: LIPASE, AMYLASE,  in the last 168 hours No results found for this basename: AMMONIA,  in the last 168 hours CBC:  Recent Labs Lab 10/02/13 2348  WBC 27.6*  NEUTROABS 23.7*  HGB 13.7  HCT 40.7  MCV 91.5  PLT 245   Cardiac Enzymes:  Recent Labs Lab 10/02/13 2349  TROPONINI <0.30    BNP (last 3 results) No results found for this basename: PROBNP,  in the last 8760 hours CBG: No results found for this basename: GLUCAP,  in the last 168 hours  Radiological Exams on Admission: Ct Lumbar Spine W Contrast  10/02/2013   CLINICAL DATA:  Right greater than left low back pain and diffuse bilateral leg pain and weakness.  EXAM: LUMBAR MYELOGRAM  FLUOROSCOPY TIME:  1 min 23 seconds  PROCEDURE: After thorough discussion of risks and benefits of the procedure including bleeding, infection, injury to nerves, blood vessels, adjacent structures as well as headache and CSF leak, written and oral informed consent was obtained. Consent was obtained by Dr. Logan Bores. Time out form was completed.  Patient was positioned prone on the fluoroscopy table. Local anesthesia was provided with 1% lidocaine without epinephrine after prepped and draped in the usual sterile fashion. Puncture was performed at L4-5 using a 22-gauge spinal needle via a midline approach. Using a single pass through the dura, the needle was placed within the thecal sac, with return of clear CSF. 15 mL of Omnipaque-180 was injected into the thecal sac, with normal opacification of the nerve roots and cauda equina consistent with free flow within the  subarachnoid space.  I personally performed the lumbar puncture and administered the intrathecal contrast. I also personally supervised acquisition of the myelogram images.  TECHNIQUE: Contiguous axial images were obtained through the Lumbar spine after the intrathecal infusion of contrast. Coronal and sagittal reconstructions were obtained of the axial image sets.  COMPARISON:  Lumbar spine CT 11/15/2012  FINDINGS: LUMBAR MYELOGRAM FINDINGS:  Transitional lumbosacral anatomy is again noted. The numbering employed on the prior lumbar spine CT will be continued on this examination, with partial lumbarization of S1. Prior posterior fusion levels are again labeled L4-5. Mild L2 compression fracture status post cement augmentation is noted. There is slight retrolisthesis of L1 on L2, and there is slight anterolisthesis of L3 on L4 and L4 on L5. No significant change is identified with flexion or extension. Small ventral epidural defects are present from L1-2 to L4-5.  CT LUMBAR MYELOGRAM FINDINGS:  Transitional lumbosacral anatomy with numbering as above. Mild L2 compression fracture status post augmentation, unchanged. Prior L4-5 posterior fusion with bilateral transpedicular fixation screws in place. No lucency is identified surrounding the screws to suggest loosening. There is trace retrolisthesis of L1 on L2, and there is trace anterolisthesis of L3 on L4 and L4 on L5. Infrarenal abdominal aorta measures up to 2.9 cm in transverse diameter, unchanged. 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  T12-L1:  Minimal disc bulge without stenosis.  L1-2: Listhesis, mild disc bulge, and mild endplate spurring result in mild bilateral lateral recess narrowing and mild left greater than right neural foraminal narrowing.  L2-3: Prior laminectomy. Mild disc bulge without spinal stenosis. Disc space narrowing and foraminal osteophytes result in  mild-to-moderate bilateral neural foraminal stenosis, unchanged.  L3-4: Prior laminectomy without spinal stenosis. Mild disc bulge, endplate spurring, and facet arthrosis result in mild bilateral neural foraminal narrowing.  L4-5: Prior laminectomy without spinal stenosis. Mild disc bulge and endplate and facet spurring result in mild-to-moderate bilateral neural foraminal narrowing, greater on the right. This is similar to the prior study.  L5-S1: Circumferential disc bulge and osteophytic ridging and mild facet arthrosis result in mild-to-moderate left lateral recess stenosis and mild-to-moderate bilateral neural foraminal stenosis.  S1-2:  Rudimentary disc without stenosis.  IMPRESSION: 1. Prior L4-5 posterior decompression and fusion with decompressed spinal canal. 2. Multilevel degenerative disc disease and facet arthrosis resulting in mild to moderate bilateral neural foraminal narrowing as above.   Electronically Signed   By: Logan Bores   On: 10/02/2013 17:17   Dg Chest Port 1 View  10/02/2013   CLINICAL DATA:  Cough, fever  EXAM: PORTABLE CHEST - 1 VIEW  COMPARISON:  02/25/2013  FINDINGS: Mild patchy opacity in the right upper lobe, lingula, and left lower lobe, suspicious for pneumonia. No pleural effusion or pneumothorax.  The heart is top-normal in size. Left subclavian pacemaker. Postsurgical changes related to prior CABG.  Cervical spine fixation hardware.  IMPRESSION: Suspected multifocal pneumonia in the right upper lobe, lingula, and left lower lobe.   Electronically Signed   By: Julian Hy M.D.   On: 10/02/2013 23:43   Dg Myelography Lumbar Inj Lumbosacral  10/02/2013   CLINICAL DATA:  Right greater than left low back pain and diffuse bilateral leg pain and weakness.  EXAM: LUMBAR MYELOGRAM  FLUOROSCOPY TIME:  1 min 23 seconds  PROCEDURE: After thorough discussion of risks and benefits of the procedure including bleeding, infection, injury to nerves, blood vessels, adjacent structures  as well as headache and CSF leak, written and oral informed consent was obtained. Consent was obtained by Dr. Logan Bores. Time out form was completed.  Patient was positioned prone on the fluoroscopy table. Local anesthesia was provided with 1% lidocaine without epinephrine after prepped and draped in the usual sterile fashion. Puncture was performed at L4-5 using a 22-gauge spinal needle via a midline approach. Using a single pass through the dura, the needle was placed within the thecal sac, with return of clear CSF. 15 mL of Omnipaque-180 was injected into the thecal sac, with normal opacification of the nerve roots and cauda equina consistent with free flow within the subarachnoid space.  I personally performed the lumbar puncture and administered the intrathecal contrast. I also personally supervised acquisition of the myelogram images.  TECHNIQUE: Contiguous axial images were obtained through the Lumbar spine after the intrathecal infusion of contrast. Coronal and sagittal reconstructions were obtained of the axial image sets.  COMPARISON:  Lumbar spine CT 11/15/2012  FINDINGS: LUMBAR MYELOGRAM FINDINGS:  Transitional lumbosacral anatomy is again noted. The numbering employed on the prior lumbar spine CT will be continued on this examination, with partial lumbarization of S1. Prior posterior fusion levels are again labeled L4-5. Mild L2 compression fracture status post cement augmentation is noted. There is slight retrolisthesis of L1 on L2, and there is slight anterolisthesis of L3 on L4 and L4 on L5. No significant change is identified with flexion or extension. Small ventral epidural defects are present from L1-2 to L4-5.  CT LUMBAR MYELOGRAM FINDINGS:  Transitional lumbosacral anatomy with numbering as above. Mild L2 compression  fracture status post augmentation, unchanged. Prior L4-5 posterior fusion with bilateral transpedicular fixation screws in place. No lucency is identified surrounding the screws  to suggest loosening. There is trace retrolisthesis of L1 on L2, and there is trace anterolisthesis of L3 on L4 and L4 on L5. Infrarenal abdominal aorta measures up to 2.9 cm in transverse diameter, unchanged. 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  T12-L1:  Minimal disc bulge without stenosis.  L1-2: Listhesis, mild disc bulge, and mild endplate spurring result in mild bilateral lateral recess narrowing and mild left greater than right neural foraminal narrowing.  L2-3: Prior laminectomy. Mild disc bulge without spinal stenosis. Disc space narrowing and foraminal osteophytes result in mild-to-moderate bilateral neural foraminal stenosis, unchanged.  L3-4: Prior laminectomy without spinal stenosis. Mild disc bulge, endplate spurring, and facet arthrosis result in mild bilateral neural foraminal narrowing.  L4-5: Prior laminectomy without spinal stenosis. Mild disc bulge and endplate and facet spurring result in mild-to-moderate bilateral neural foraminal narrowing, greater on the right. This is similar to the prior study.  L5-S1: Circumferential disc bulge and osteophytic ridging and mild facet arthrosis result in mild-to-moderate left lateral recess stenosis and mild-to-moderate bilateral neural foraminal stenosis.  S1-2:  Rudimentary disc without stenosis.  IMPRESSION: 1. Prior L4-5 posterior decompression and fusion with decompressed spinal canal. 2. Multilevel degenerative disc disease and facet arthrosis resulting in mild to moderate bilateral neural foraminal narrowing as above.   Electronically Signed   By: Logan Bores   On: 10/02/2013 17:17    EKG: Independently reviewed.  Assessment/Plan Principal Problem:   Community acquired pneumonia Active Problems:   ATRIAL FIBRILLATION, HX OF   Sepsis   1. CAP - CXR demonstrates PNA, got zosyn and vanc in ED, putting patient on rocephin and azithromycin as per  protocol.  On PNA pathway, blood, sputum cultures pending.  Urine culture pending as his urine certainly is highly suspicious as another source of infection vs colonization as he has an ileal conduit. 2. Sepsis - repeat CBC in am was 27k here in ED, IVF resolved his tachycardia and hypotension.  BP goal > 65. 3. H/o A.Fib - given his BP improvement will go ahead and leave him on the small dose of toprol he takes at home for rate control.  Coumadin per pharm.    Code Status: Full Code  Family Communication: No family in room Disposition Plan: Admit to inpatient   Time spent: 39 min  [facility] Pager [phone]  If 7AM-7PM, please contact the day team taking care of the patient [email or web address] Password [email or web address] 10/03/2013, 1:55 AM

## 2013-10-03 NOTE — ED Notes (Signed)
Reported headache to Dr. Lita Mains.  MD gives verbal order for 11mcg of fentanyl for management.

## 2013-10-03 NOTE — Progress Notes (Addendum)
ANTICOAGULATION CONSULT NOTE - Initial Consult  Pharmacy Consult for Coumadin  Indication: atrial fibrillation  Allergies  Allergen Reactions  . Codeine Nausea And Vomiting  . Crestor [Rosuvastatin Calcium] Other (See Comments)    Leg cramps    Patient Measurements: Height: 5\' 9"  (175.3 cm) Weight: 177 lb 4 oz (80.4 kg) IBW/kg (Calculated) : 70.7 Heparin Dosing Weight:   Vital Signs: Temp: 97.5 F (36.4 C) (05/02 0451) Temp src: Oral (05/02 0451) BP: 93/44 mmHg (05/02 0451) Pulse Rate: 70 (05/02 0451)  Labs:  Recent Labs  10/02/13 2348 10/02/13 2349 10/03/13 0201  HGB 13.7  --  12.1*  HCT 40.7  --  36.2*  PLT 245  --  236  APTT  --  34  --   LABPROT  --  13.6  --   INR  --  1.06  --   CREATININE 0.97  --  1.00  TROPONINI  --  <0.30  --     Estimated Creatinine Clearance: 56 ml/min (by C-G formula based on Cr of 1).   Medical History: Past Medical History  Diagnosis Date  . Hyperlipidemia   . Orthostatic hypotension   . Bradycardia   . Syncope     s/p PTVP  . Myocardial infarction     1981  . CAD (coronary artery disease)     prior CABG in 2002 negative Myoview in 2009  . Hypertension   . Stroke     TIA  2007    . GERD (gastroesophageal reflux disease)   . Headache(784.0)   . Arthritis     OSTEOPOROSIS  . Aortic stenosis, mild     by 01/08/08 echo  . Chronic anticoagulation   . Depression   . AAA (abdominal aortic aneurysm)   . Heart attack   . Pacemaker     medtronic pacer  . History of cervical spinal surgery     over [place]  [name] HOH (hard of hearing)     BILATERAL HEARING AIDS  . Prostate cancer   . Basal cell adenocarcinoma     'bottom lip" (11/06/2012)  . Bladder cancer   . Attention to urostomy     "since 2006" (11/06/2012)  . Atrial fibrillation     on amiodarone and has PTVP in place  . Exertional shortness of breath   . Chronic radicular pain of lower back   . Anxiety     Medications:  Prescriptions prior to admission   Medication Sig Dispense Refill  . escitalopram (LEXAPRO) 20 MG tablet Take 20 mg by mouth daily.      [name] gabapentin (NEURONTIN) 300 MG capsule Take 1 capsule (300 mg total) by mouth 3 (three) times daily.  90 capsule  1  . HYDROcodone-acetaminophen (NORCO/VICODIN) 5-325 MG per tablet Take 1 tablet by mouth every 4 (four) hours as needed for pain.      . methocarbamol (ROBAXIN) 500 MG tablet       . metoprolol succinate (TOPROL-XL) 25 MG 24 hr tablet Take 1 tablet (25 mg total) by mouth daily.  90 tablet  3  . simvastatin (ZOCOR) 10 MG tablet Take 10 mg by mouth at bedtime.      [name] tiotropium (SPIRIVA) 18 MCG inhalation capsule Place 18 mcg into inhaler and inhale daily as needed (for shortness of breath).      . warfarin (COUMADIN) 5 MG tablet Take 5 mg by mouth daily.      [name] zolpidem (AMBIEN) 10 MG tablet Take  10 mg by mouth at bedtime.       . nitroGLYCERIN (NITROSTAT) 0.4 MG SL tablet Place 1 tablet (0.4 mg total) under the tongue every 5 (five) minutes as needed for chest pain.  25 tablet  prn   Scheduled:  . azithromycin  500 mg Intravenous Q24H  . cefTRIAXone (ROCEPHIN)  IV  1 g Intravenous Q24H  . escitalopram  20 mg Oral Daily  . gabapentin  300 mg Oral TID  . metoprolol succinate  25 mg Oral Daily  . simvastatin  10 mg Oral QHS  . zolpidem  5 mg Oral QHS    Assessment:  80 yo who was admitted for flank pain. He was admitted for CAP. He has been on coumadin for afib. In the admission note, MD wrote for Rx to dose coumadin but no consult was ordered. Will start coumadin today. His INR is 1 at baseline so wondering if he has been compliant with his meds. Abx is also changed to vanc/zosyn now to cover for PNA.   Goal of Therapy:  INR 2-3 Monitor platelets by anticoagulation protocol: Yes Vanc trough = 15-20   Plan:   Coumadin 5mg  PO x1 Daily INR Vanc 750mg  IV q12 Trough at steady state

## 2013-10-04 DIAGNOSIS — J449 Chronic obstructive pulmonary disease, unspecified: Secondary | ICD-10-CM

## 2013-10-04 LAB — CBC
HCT: 33.5 % — ABNORMAL LOW (ref 39.0–52.0)
Hemoglobin: 11 g/dL — ABNORMAL LOW (ref 13.0–17.0)
MCH: 30.6 pg (ref 26.0–34.0)
MCHC: 32.8 g/dL (ref 30.0–36.0)
MCV: 93.3 fL (ref 78.0–100.0)
PLATELETS: 186 10*3/uL (ref 150–400)
RBC: 3.59 MIL/uL — AB (ref 4.22–5.81)
RDW: 15.7 % — ABNORMAL HIGH (ref 11.5–15.5)
WBC: 13.1 10*3/uL — ABNORMAL HIGH (ref 4.0–10.5)

## 2013-10-04 LAB — PROTIME-INR
INR: 1.32 (ref 0.00–1.49)
Prothrombin Time: 16.1 seconds — ABNORMAL HIGH (ref 11.6–15.2)

## 2013-10-04 LAB — GRAM STAIN

## 2013-10-04 LAB — BASIC METABOLIC PANEL
BUN: 17 mg/dL (ref 6–23)
CO2: 21 meq/L (ref 19–32)
Calcium: 8.3 mg/dL — ABNORMAL LOW (ref 8.4–10.5)
Chloride: 107 mEq/L (ref 96–112)
Creatinine, Ser: 1.01 mg/dL (ref 0.50–1.35)
GFR calc Af Amer: 77 mL/min — ABNORMAL LOW (ref >90)
GFR calc non Af Amer: 67 mL/min — ABNORMAL LOW (ref >90)
Glucose, Bld: 102 mg/dL — ABNORMAL HIGH (ref 70–99)
Potassium: 4.1 mEq/L (ref 3.7–5.3)
Sodium: 140 mEq/L (ref 137–147)

## 2013-10-04 MED ORDER — WARFARIN SODIUM 5 MG PO TABS
5.0000 mg | ORAL_TABLET | Freq: Once | ORAL | Status: AC
  Administered 2013-10-04: 5 mg via ORAL
  Filled 2013-10-04: qty 1

## 2013-10-04 NOTE — Progress Notes (Signed)
ANTICOAGULATION CONSULT NOTE - Initial Consult  Pharmacy Consult for Coumadin  Indication: atrial fibrillation  Allergies  Allergen Reactions  . Codeine Nausea And Vomiting  . Crestor [Rosuvastatin Calcium] Other (See Comments)    Leg cramps    Patient Measurements: Height: 5\' 9"  (175.3 cm) Weight: 177 lb 4 oz (80.4 kg) IBW/kg (Calculated) : 70.7 Heparin Dosing Weight:   Vital Signs: Temp: 97.8 F (36.6 C) (05/03 0319) Temp src: Oral (05/03 0319) BP: 104/52 mmHg (05/03 0319) Pulse Rate: 72 (05/03 0319)  Labs:  Recent Labs  10/02/13 2348 10/02/13 2349 10/03/13 0201 10/03/13 0805 10/04/13 0300  HGB 13.7  --  12.1*  --  11.0*  HCT 40.7  --  36.2*  --  33.5*  PLT 245  --  236  --  186  APTT  --  34  --   --   --   LABPROT  --  13.6  --   --  16.1*  INR  --  1.06  --   --  1.32  CREATININE 0.97  --  1.00 1.06 1.01  TROPONINI  --  <0.30  --   --   --     Estimated Creatinine Clearance: 55.4 ml/min (by C-G formula based on Cr of 1.01).   Medical History: Past Medical History  Diagnosis Date  . Hyperlipidemia   . Orthostatic hypotension   . Bradycardia   . Syncope     s/p PTVP  . Myocardial infarction     1981  . CAD (coronary artery disease)     prior CABG in 2002 negative Myoview in 2009  . Hypertension   . Stroke     TIA  2007    . GERD (gastroesophageal reflux disease)   . Headache(784.0)   . Arthritis     OSTEOPOROSIS  . Aortic stenosis, mild     by 01/08/08 echo  . Chronic anticoagulation   . Depression   . AAA (abdominal aortic aneurysm)   . Heart attack   . Pacemaker     medtronic pacer  . History of cervical spinal surgery     over in [place]  [name] HOH (hard of hearing)     BILATERAL HEARING AIDS  . Prostate cancer   . Basal cell adenocarcinoma     'bottom lip" (11/06/2012)  . Bladder cancer   . Attention to urostomy     "since 2006" (11/06/2012)  . Atrial fibrillation     on amiodarone and has PTVP in place  . Exertional shortness of  breath   . Chronic radicular pain of lower back   . Anxiety     Medications:  Prescriptions prior to admission  Medication Sig Dispense Refill  . escitalopram (LEXAPRO) 20 MG tablet Take 20 mg by mouth daily.      [name] gabapentin (NEURONTIN) 300 MG capsule Take 1 capsule (300 mg total) by mouth 3 (three) times daily.  90 capsule  1  . HYDROcodone-acetaminophen (NORCO/VICODIN) 5-325 MG per tablet Take 1 tablet by mouth every 4 (four) hours as needed for pain.      . methocarbamol (ROBAXIN) 500 MG tablet       . metoprolol succinate (TOPROL-XL) 25 MG 24 hr tablet Take 1 tablet (25 mg total) by mouth daily.  90 tablet  3  . simvastatin (ZOCOR) 10 MG tablet Take 10 mg by mouth at bedtime.      [name] tiotropium (SPIRIVA) 18 MCG inhalation capsule Place 18 mcg into inhaler  and inhale daily as needed (for shortness of breath).      . warfarin (COUMADIN) 5 MG tablet Take 5 mg by mouth daily.      [name] zolpidem (AMBIEN) 10 MG tablet Take 10 mg by mouth at bedtime.       . nitroGLYCERIN (NITROSTAT) 0.4 MG SL tablet Place 1 tablet (0.4 mg total) under the tongue every 5 (five) minutes as needed for chest pain.  25 tablet  prn   Scheduled:  . escitalopram  20 mg Oral Daily  . gabapentin  300 mg Oral TID  . metoprolol succinate  25 mg Oral Daily  . piperacillin-tazobactam (ZOSYN)  IV  3.375 g Intravenous Q8H  . simvastatin  10 mg Oral QHS  . vancomycin  750 mg Intravenous Q12H  . Warfarin - Pharmacist Dosing Inpatient   Does not apply q1800  . zolpidem  5 mg Oral QHS    Assessment:  80 yo who was admitted for flank pain. He was admitted for CAP. He has been on coumadin for afib. INR up slightly today to 1.3. Abx is also changed to vanc/zosyn now to cover for PNA.   Goal of Therapy:  INR 2-3 Monitor platelets by anticoagulation protocol: Yes Vanc trough = 15-20   Plan:   Coumadin 5mg  PO x1 Daily INR Vanc 750mg  IV q12 Trough at steady state

## 2013-10-04 NOTE — Progress Notes (Signed)
Peter Richards [id number] Admission Data: 10/04/2013 11:54 AM Attending Provider: Elmarie Shiley, MD  TKZ:SWFUXNATF,TDD THOMAS, MD Consults/ Treatment Team:    Peter Richards is a 80 y.o. male patient admitted from ED awake, alert  & orientated  X 3,  Full Code, VSS - Blood pressure 151/79, pulse 61, temperature 97.5 F (36.4 C), temperature source Oral, resp. rate 18, height 5\' 9"  (1.753 m), weight 80.4 kg (177 lb 4 oz), SpO2 94.00%., no c/o shortness of breath, no c/o chest pain, no distress noted.  IV site WDL:  forearm left, condition patent and no redness with a transparent dsg that's clean dry and intact.  Allergies:   Allergies  Allergen Reactions  . Codeine Nausea And Vomiting  . Crestor [Rosuvastatin Calcium] Other (See Comments)    Leg cramps     Past Medical History  Diagnosis Date  . Hyperlipidemia   . Orthostatic hypotension   . Bradycardia   . Syncope     s/p PTVP  . Myocardial infarction     1981  . CAD (coronary artery disease)     prior CABG in 2002 negative Myoview in 2009  . Hypertension   . Stroke     TIA  2007    . GERD (gastroesophageal reflux disease)   . Headache(784.0)   . Arthritis     OSTEOPOROSIS  . Aortic stenosis, mild     by 01/08/08 echo  . Chronic anticoagulation   . Depression   . AAA (abdominal aortic aneurysm)   . Heart attack   . Pacemaker     medtronic pacer  . History of cervical spinal surgery     [place]  [name] HOH (hard of hearing)     BILATERAL HEARING AIDS  . Prostate cancer   . Basal cell adenocarcinoma     'bottom lip" (11/06/2012)  . Bladder cancer   . Attention to urostomy     "since 2006" (11/06/2012)  . Atrial fibrillation     on amiodarone and has PTVP in place  . Exertional shortness of breath   . Chronic radicular pain of lower back   . Anxiety     History:  obtained from the patient. Tobacco/alcohol: Former smoker  Pt orientation to unit, room and routine. Information packet given to  patient/family and safety video watched.  Admission INP armband ID verified with patient/family, and in place. SR up x 2, fall risk assessment complete with Patient and family verbalizing understanding of risks associated with falls. Pt verbalizes an understanding of how to use the call bell and to call for help before getting out of bed.  Skin, clean-dry- intact without evidence of bruising, or skin tears.   No evidence of skin break down noted on exam. no rashes, no ecchymoses    Will cont to monitor and assist as needed.  Peter Richards Elaine Shriyans Kuenzi, RN 10/04/2013 11:54 AM

## 2013-10-04 NOTE — Progress Notes (Signed)
Pt tx to [facility] per MD order, family at [facility], pt VSS, pt and family verbalized understanding of tx, report called to receiving RN(Ginger), all questions answered, updates given at [facility]

## 2013-10-04 NOTE — Progress Notes (Signed)
[facility] PROGRESS NOTE  Peter Richards [id number]:[id number] DOB: 01/09/1929 DOA: 10/02/2013 PCP: Mathews Argyle, MD  Assessment/Plan: 1-PNA, health care associated; continue with Vancomycin and Zosyn day 2. WBC decrease to 13 from 30. Blood culture pending.   2-Sepsis; Resolved. NSL. Continue IV antibiotics. BP stable, will transfer to regular bed.   3-A fib; Continue with metoprolol. Coumadin per pharmacy.   4-Diarrhea; no further episode.   5-UTI. On Zosyn. Follow up urine culture.   6-Hyperkalemia; repeat B-met. Probably hemolysis.   7-Back pain; CT myelogram showed; 1. Prior L4-5 posterior decompression and fusion with decompressed  spinal canal.  Multilevel degenerative disc disease and facet arthrosis resulting in mild to moderate bilateral neural foraminal narrowing as above.  -PT consult.  -Will inform [facility] of admission.   Code Status: Full Code.  Family Communication: Care discussed with patient.  Disposition Plan: Remain step down.    Consultants:  none  Procedures:  none  Antibiotics:  Vancomycin 5-02  Zosyn 5-02.   HPI/Subjective: Feeling better. Wants to go home. No dyspnea.  Still with back pain. Bilateral leg pain when he try to ambulate.   Objective: Filed Vitals:   10/04/13 0741  BP: 137/56  Pulse: 61  Temp: 97.9 F (36.6 C)  Resp: 17    Intake/Output Summary (Last 24 hours) at 10/04/13 0825 Last data filed at 10/04/13 0653  Gross per 24 hour  Intake 4051.25 ml  Output   2250 ml  Net 1801.25 ml   Filed Weights   10/02/13 2131 10/03/13 0239  Weight: 83.915 kg (185 lb) 80.4 kg (177 lb 4 oz)    Exam:   General:  Alert , no distress.   Cardiovascular: S 1, S 2 RRR  Respiratory: Crackles bases.   Abdomen: Bs present, soft, NT, urostomy in place.   Musculoskeletal: no edema.   Data Reviewed: Basic Metabolic Panel:  Recent Labs Lab 10/02/13 2348 10/03/13 0201 10/03/13 0805 10/04/13 0300  NA  138 135* 139 140  K 4.2 6.0* 4.6 4.1  CL 100 100 103 107  CO2 _0 GLUCOSE 112* 149* 98 102*  BUN _1 CREATININE 0.97 1.00 1.06 1.01  CALCIUM 9.4 8.1* 8.2* 8.3*   Liver Function Tests:  Recent Labs Lab 10/02/13 2348  AST 15  ALT 10  ALKPHOS 98  BILITOT 0.6  PROT 7.7  ALBUMIN 3.7   No results found for this basename: LIPASE, AMYLASE,  in the last 168 hours No results found for this basename: AMMONIA,  in the last 168 hours CBC:  Recent Labs Lab 10/02/13 2348 10/03/13 0201 10/04/13 0300  WBC 27.6* 30.3* 13.1*  NEUTROABS 23.7*  --   --   HGB 13.7 12.1* 11.0*  HCT 40.7 36.2* 33.5*  MCV 91.5 92.1 93.3  PLT 245 236 186   Cardiac Enzymes:  Recent Labs Lab 10/02/13 2349  TROPONINI <0.30   BNP (last 3 results) No results found for this basename: PROBNP,  in the last 8760 hours CBG: No results found for this basename: GLUCAP,  in the last 168 hours  Recent Results (from the past 240 hour(s))  MRSA PCR SCREENING     Status: None   Collection Time    10/03/13  2:34 AM      Result Value Ref Range Status   MRSA by PCR NEGATIVE  NEGATIVE Final   Comment:            The GeneXpert MRSA Assay (FDA  approved for NASAL specimens     only), is one component of a     comprehensive MRSA colonization     surveillance program. It is not     intended to diagnose MRSA     infection nor to guide or     monitor treatment for     MRSA infections.     Studies: Ct Lumbar Spine W Contrast  10/02/2013   CLINICAL DATA:  Right greater than left low back pain and diffuse bilateral leg pain and weakness.  EXAM: LUMBAR MYELOGRAM  FLUOROSCOPY TIME:  1 min 23 seconds  PROCEDURE: After thorough discussion of risks and benefits of the procedure including bleeding, infection, injury to nerves, blood vessels, adjacent structures as well as headache and CSF leak, written and oral informed consent was obtained. Consent was obtained by Dr. Logan Bores. Time out form was  completed.  Patient was positioned prone on the fluoroscopy table. Local anesthesia was provided with 1% lidocaine without epinephrine after prepped and draped in the usual sterile fashion. Puncture was performed at L4-5 using a 22-gauge spinal needle via a midline approach. Using a single pass through the dura, the needle was placed within the thecal sac, with return of clear CSF. 15 mL of Omnipaque-180 was injected into the thecal sac, with normal opacification of the nerve roots and cauda equina consistent with free flow within the subarachnoid space.  I personally performed the lumbar puncture and administered the intrathecal contrast. I also personally supervised acquisition of the myelogram images.  TECHNIQUE: Contiguous axial images were obtained through the Lumbar spine after the intrathecal infusion of contrast. Coronal and sagittal reconstructions were obtained of the axial image sets.  COMPARISON:  Lumbar spine CT 11/15/2012  FINDINGS: LUMBAR MYELOGRAM FINDINGS:  Transitional lumbosacral anatomy is again noted. The numbering employed on the prior lumbar spine CT will be continued on this examination, with partial lumbarization of S1. Prior posterior fusion levels are again labeled L4-5. Mild L2 compression fracture status post cement augmentation is noted. There is slight retrolisthesis of L1 on L2, and there is slight anterolisthesis of L3 on L4 and L4 on L5. No significant change is identified with flexion or extension. Small ventral epidural defects are present from L1-2 to L4-5.  CT LUMBAR MYELOGRAM FINDINGS:  Transitional lumbosacral anatomy with numbering as above. Mild L2 compression fracture status post augmentation, unchanged. Prior L4-5 posterior fusion with bilateral transpedicular fixation screws in place. No lucency is identified surrounding the screws to suggest loosening. There is trace retrolisthesis of L1 on L2, and there is trace anterolisthesis of L3 on L4 and L4 on L5. Infrarenal  abdominal aorta measures up to 2.9 cm in transverse diameter, unchanged. 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  T12-L1:  Minimal disc bulge without stenosis.  L1-2: Listhesis, mild disc bulge, and mild endplate spurring result in mild bilateral lateral recess narrowing and mild left greater than right neural foraminal narrowing.  L2-3: Prior laminectomy. Mild disc bulge without spinal stenosis. Disc space narrowing and foraminal osteophytes result in mild-to-moderate bilateral neural foraminal stenosis, unchanged.  L3-4: Prior laminectomy without spinal stenosis. Mild disc bulge, endplate spurring, and facet arthrosis result in mild bilateral neural foraminal narrowing.  L4-5: Prior laminectomy without spinal stenosis. Mild disc bulge and endplate and facet spurring result in mild-to-moderate bilateral neural foraminal narrowing, greater on the right. This is similar to the prior study.  L5-S1: Circumferential disc bulge and osteophytic ridging and mild facet arthrosis result in mild-to-moderate left lateral recess stenosis and mild-to-moderate bilateral neural foraminal stenosis.  S1-2:  Rudimentary disc without stenosis.  IMPRESSION: 1. Prior L4-5 posterior decompression and fusion with decompressed spinal canal. 2. Multilevel degenerative disc disease and facet arthrosis resulting in mild to moderate bilateral neural foraminal narrowing as above.   Electronically Signed   By: Logan Bores   On: 10/02/2013 17:17   Dg Chest Port 1 View  10/02/2013   CLINICAL DATA:  Cough, fever  EXAM: PORTABLE CHEST - 1 VIEW  COMPARISON:  02/25/2013  FINDINGS: Mild patchy opacity in the right upper lobe, lingula, and left lower lobe, suspicious for pneumonia. No pleural effusion or pneumothorax.  The heart is top-normal in size. Left subclavian pacemaker. Postsurgical changes related to prior CABG.  Cervical spine fixation hardware.   IMPRESSION: Suspected multifocal pneumonia in the right upper lobe, lingula, and left lower lobe.   Electronically Signed   By: Julian Hy M.D.   On: 10/02/2013 23:43   Dg Myelography Lumbar Inj Lumbosacral  10/02/2013   CLINICAL DATA:  Right greater than left low back pain and diffuse bilateral leg pain and weakness.  EXAM: LUMBAR MYELOGRAM  FLUOROSCOPY TIME:  1 min 23 seconds  PROCEDURE: After thorough discussion of risks and benefits of the procedure including bleeding, infection, injury to nerves, blood vessels, adjacent structures as well as headache and CSF leak, written and oral informed consent was obtained. Consent was obtained by Dr. Logan Bores. Time out form was completed.  Patient was positioned prone on the fluoroscopy table. Local anesthesia was provided with 1% lidocaine without epinephrine after prepped and draped in the usual sterile fashion. Puncture was performed at L4-5 using a 22-gauge spinal needle via a midline approach. Using a single pass through the dura, the needle was placed within the thecal sac, with return of clear CSF. 15 mL of Omnipaque-180 was injected into the thecal sac, with normal opacification of the nerve roots and cauda equina consistent with free flow within the subarachnoid space.  I personally performed the lumbar puncture and administered the intrathecal contrast. I also personally supervised acquisition of the myelogram images.  TECHNIQUE: Contiguous axial images were obtained through the Lumbar spine after the intrathecal infusion of contrast. Coronal and sagittal reconstructions were obtained of the axial image sets.  COMPARISON:  Lumbar spine CT 11/15/2012  FINDINGS: LUMBAR MYELOGRAM FINDINGS:  Transitional lumbosacral anatomy is again noted. The numbering employed on the prior lumbar spine CT will be continued on this examination, with partial lumbarization of S1. Prior posterior fusion levels are again labeled L4-5. Mild L2 compression fracture status post  cement augmentation is noted. There is slight retrolisthesis of L1 on L2, and there is slight anterolisthesis of L3 on L4 and L4 on L5. No significant change is identified with flexion or extension. Small ventral epidural defects are present from L1-2 to L4-5.  CT LUMBAR MYELOGRAM FINDINGS:  Transitional lumbosacral anatomy with numbering as above. Mild L2 compression fracture status post augmentation, unchanged. Prior L4-5 posterior fusion with bilateral transpedicular fixation screws in place. No lucency is identified surrounding the screws to suggest loosening. There is trace retrolisthesis of L1 on L2, and there is trace anterolisthesis of L3 on L4 and L4 on L5. Infrarenal abdominal aorta measures up to 2.9 cm in transverse diameter, unchanged. Mild aneurysmal dilatation of the common iliac arteries is also similar to the prior CT, measuring up to 1.9 cm  on the right.  T12-L1:  Minimal disc bulge without stenosis.  L1-2: Listhesis, mild disc bulge, and mild endplate spurring result in mild bilateral lateral recess narrowing and mild left greater than right neural foraminal narrowing.  L2-3: Prior laminectomy. Mild disc bulge without spinal stenosis. Disc space narrowing and foraminal osteophytes result in mild-to-moderate bilateral neural foraminal stenosis, unchanged.  L3-4: Prior laminectomy without spinal stenosis. Mild disc bulge, endplate spurring, and facet arthrosis result in mild bilateral neural foraminal narrowing.  L4-5: Prior laminectomy without spinal stenosis. Mild disc bulge and endplate and facet spurring result in mild-to-moderate bilateral neural foraminal narrowing, greater on the right. This is similar to the prior study.  L5-S1: Circumferential disc bulge and osteophytic ridging and mild facet arthrosis result in mild-to-moderate left lateral recess stenosis and mild-to-moderate bilateral neural foraminal stenosis.  S1-2:  Rudimentary disc without stenosis.  IMPRESSION: 1. Prior L4-5  posterior decompression and fusion with decompressed spinal canal. 2. Multilevel degenerative disc disease and facet arthrosis resulting in mild to moderate bilateral neural foraminal narrowing as above.   Electronically Signed   By: Logan Bores   On: 10/02/2013 17:17    Scheduled Meds: . escitalopram  20 mg Oral Daily  . gabapentin  300 mg Oral TID  . metoprolol succinate  25 mg Oral Daily  . piperacillin-tazobactam (ZOSYN)  IV  3.375 g Intravenous Q8H  . simvastatin  10 mg Oral QHS  . vancomycin  750 mg Intravenous Q12H  . warfarin  5 mg Oral ONCE-1800  . Warfarin - Pharmacist Dosing Inpatient   Does not apply q1800  . zolpidem  5 mg Oral QHS   Continuous Infusions:    Principal Problem:   Community acquired pneumonia Active Problems:   ATRIAL FIBRILLATION, HX OF   Sepsis    Time spent: 30 minutes.     [facility] Pager [phone]. If 7PM-7AM, please contact night-coverage at [email or web address], password [facility] 10/04/2013, 8:25 AM  LOS: 2 days

## 2013-10-05 DIAGNOSIS — M545 Low back pain, unspecified: Secondary | ICD-10-CM

## 2013-10-05 DIAGNOSIS — B954 Other streptococcus as the cause of diseases classified elsewhere: Secondary | ICD-10-CM

## 2013-10-05 DIAGNOSIS — R7881 Bacteremia: Secondary | ICD-10-CM

## 2013-10-05 DIAGNOSIS — R21 Rash and other nonspecific skin eruption: Secondary | ICD-10-CM

## 2013-10-05 LAB — CBC
HCT: 34.5 % — ABNORMAL LOW (ref 39.0–52.0)
Hemoglobin: 11.4 g/dL — ABNORMAL LOW (ref 13.0–17.0)
MCH: 30.5 pg (ref 26.0–34.0)
MCHC: 33 g/dL (ref 30.0–36.0)
MCV: 92.2 fL (ref 78.0–100.0)
Platelets: 212 10*3/uL (ref 150–400)
RBC: 3.74 MIL/uL — ABNORMAL LOW (ref 4.22–5.81)
RDW: 15.6 % — ABNORMAL HIGH (ref 11.5–15.5)
WBC: 11.3 10*3/uL — ABNORMAL HIGH (ref 4.0–10.5)

## 2013-10-05 LAB — BASIC METABOLIC PANEL
BUN: 15 mg/dL (ref 6–23)
CALCIUM: 8.5 mg/dL (ref 8.4–10.5)
CO2: 23 mEq/L (ref 19–32)
CREATININE: 1.01 mg/dL (ref 0.50–1.35)
Chloride: 107 mEq/L (ref 96–112)
GFR calc Af Amer: 77 mL/min — ABNORMAL LOW (ref >90)
GFR, EST NON AFRICAN AMERICAN: 67 mL/min — AB (ref >90)
Glucose, Bld: 88 mg/dL (ref 70–99)
Potassium: 3.9 mEq/L (ref 3.7–5.3)
Sodium: 141 mEq/L (ref 137–147)

## 2013-10-05 LAB — URINE CULTURE

## 2013-10-05 LAB — CULTURE, BLOOD (ROUTINE X 2)

## 2013-10-05 LAB — LEGIONELLA ANTIGEN, URINE: Legionella Antigen, Urine: NEGATIVE

## 2013-10-05 LAB — PROTIME-INR
INR: 1.26 (ref 0.00–1.49)
Prothrombin Time: 15.5 seconds — ABNORMAL HIGH (ref 11.6–15.2)

## 2013-10-05 MED ORDER — LEVOFLOXACIN 750 MG PO TABS
750.0000 mg | ORAL_TABLET | Freq: Every day | ORAL | Status: DC
  Filled 2013-10-05: qty 1

## 2013-10-05 MED ORDER — FENTANYL 25 MCG/HR TD PT72
25.0000 ug | MEDICATED_PATCH | TRANSDERMAL | Status: DC
  Administered 2013-10-05: 25 ug via TRANSDERMAL
  Filled 2013-10-05: qty 1

## 2013-10-05 MED ORDER — MENTHOL 3 MG MT LOZG
1.0000 | LOZENGE | OROMUCOSAL | Status: DC | PRN
  Administered 2013-10-05: 3 mg via ORAL
  Filled 2013-10-05: qty 9

## 2013-10-05 MED ORDER — PIPERACILLIN-TAZOBACTAM 3.375 G IVPB
3.3750 g | Freq: Three times a day (TID) | INTRAVENOUS | Status: DC
  Administered 2013-10-05 – 2013-10-06 (×3): 3.375 g via INTRAVENOUS
  Filled 2013-10-05 (×4): qty 50

## 2013-10-05 MED ORDER — WARFARIN SODIUM 7.5 MG PO TABS
7.5000 mg | ORAL_TABLET | Freq: Once | ORAL | Status: AC
  Administered 2013-10-05: 7.5 mg via ORAL
  Filled 2013-10-05: qty 1

## 2013-10-05 NOTE — Consult Note (Signed)
[facility]  Date of Admission:  10/02/2013  Date of Consult:  10/05/2013  Reason for Consult: Viridans Strep Bacteremia,     Rash Referring Physician: Regalado  Impression/Recommendation Rash Viridans Strep bacteremia Multifocal pneumonia  Would- Recheck BCx F/u CXR at completion of anbx Consider TTE When ready for d/c, change to augmentin for 2 weeks  Comment- Viridans strep can be seen in many conditions- bacteremia, endocarditis, cellulitis, dental infections. I am unclear as to the cause of his rash, it does no have a "central lesion" as would be expected from an embolic lesion. It crosses mid-line so zoster seems less likely.  If his back pain changes in any way, he has neurolgic change, or he has further fever, I would get a MRI of his spine. Due to his age, I would not pursue TEE.   Thank you so much for this interesting consult,   [name] (pager) [phone] [email or web address]  Peter Richards is an 80 y.o. male.  HPI: 80 yo M with hx of CAD/CABG, admitted from SNF on 5-2 from SNF with change in mental status, cough, SOB. His WBC was 27.6, he was afebrile.  He was started on vanco and zosyn. His BCx have since grown Viridans strep (1/2).  UCx >100k polymicrobial.  His WBC has decreased to 11.3.  He has previously had lumbar decompression, microdiscectomy 11-2012. To follow this up, he underwent CT myelogram on 10-02-13:  1. Prior L4-5 posterior decompression and fusion with decompressed spinal canal.  2. Multilevel degenerative disc disease and facet arthrosis resulting in mild to moderate bilateral neural foraminal narrowing as above.  Past Medical History  Diagnosis Date  . Hyperlipidemia   . Orthostatic hypotension   . Bradycardia   . Syncope     s/p PTVP  . Myocardial infarction     1981  . CAD (coronary artery disease)     prior CABG in 2002 negative Myoview in 2009  . Hypertension   . Stroke     TIA  2007    .  GERD (gastroesophageal reflux disease)   . Headache(784.0)   . Arthritis     OSTEOPOROSIS  . Aortic stenosis, mild     by 01/08/08 echo  . Chronic anticoagulation   . Depression   . AAA (abdominal aortic aneurysm)   . Heart attack   . Pacemaker     medtronic pacer  . History of cervical spinal surgery     [place]  [name] HOH (hard of hearing)     BILATERAL HEARING AIDS  . Prostate cancer   . Basal cell adenocarcinoma     'bottom lip" (11/06/2012)  . Bladder cancer   . Attention to urostomy     "since 2006" (11/06/2012)  . Atrial fibrillation     on amiodarone and has PTVP in place  . Exertional shortness of breath   . Chronic radicular pain of lower back   . Anxiety     Past Surgical History  Procedure Laterality Date  . Insert / replace / remove pacemaker      11/2009  DR BRODIE    . Prostatectomy      BLADDER ALSO REMOVED (OSTOMY BAG)  2006  FOR CANCER     . Abdominal aortic aneurysm repair  1999    STENT PLACED    . Cataract extraction w/ intraocular lens  implant, bilateral    . Appendectomy      1959  .  Revision urostomy cutaneous    . Revision urostomy cutaneous  2006    Urostomy bag placed on right lower abdomen  . Vertebroplasty  08/22/2011    Procedure: VERTEBROPLASTY;  Surgeon: Winfield Cunas, MD;  Location: [facility];  Service: Neurosurgery;  Laterality: N/A;  Lumbar Two vertebroplasty  . Cholecystectomy      4 YRS AGO  . Posterior lumbar fusion  11/06/2012  . Coronary artery bypass graft  1999    CABG X2  . Skin cancer excision      "bottom lip" (11/06/2012)  . Bladder surgery      "for cancer" (11/06/2012)  . Lumbar laminectomy/decompression microdiscectomy Bilateral 11/06/2012    Procedure: LUMBAR LAMINECTOMY/DECOMPRESSION MICRODISCECTOMY;  Surgeon: Sinclair Ship, MD;  Location: [facility];  Service: Orthopedics;  Laterality: Bilateral;  Lumbar 2-5 decompression     Allergies  Allergen Reactions  . Codeine Nausea And Vomiting  . Crestor  [Rosuvastatin Calcium] Other (See Comments)    Leg cramps    Medications:  Scheduled: . escitalopram  20 mg Oral Daily  . fentaNYL  25 mcg Transdermal Q72H  . gabapentin  300 mg Oral TID  . metoprolol succinate  25 mg Oral Daily  . piperacillin-tazobactam (ZOSYN)  IV  3.375 g Intravenous Q8H  . simvastatin  10 mg Oral QHS  . warfarin  7.5 mg Oral ONCE-1800  . Warfarin - Pharmacist Dosing Inpatient   Does not apply q1800  . zolpidem  5 mg Oral QHS    Abtx:  Anti-infectives   Start     Dose/Rate Route Frequency Ordered Stop   10/05/13 1600  piperacillin-tazobactam (ZOSYN) IVPB 3.375 g     3.375 g 12.5 mL/hr over 240 Minutes Intravenous Every 8 hours 10/05/13 1009     10/05/13 1000  levofloxacin (LEVAQUIN) tablet 750 mg  Status:  Discontinued     750 mg Oral Daily 10/05/13 0900 10/05/13 0910   10/03/13 1200  vancomycin (VANCOCIN) IVPB 750 mg/150 ml premix  Status:  Discontinued     750 mg 150 mL/hr over 60 Minutes Intravenous Every 12 hours 10/03/13 0745 10/05/13 0900   10/03/13 0800  piperacillin-tazobactam (ZOSYN) IVPB 3.375 g  Status:  Discontinued     3.375 g 12.5 mL/hr over 240 Minutes Intravenous Every 8 hours 10/03/13 0745 10/05/13 0900   10/03/13 0200  cefTRIAXone (ROCEPHIN) 1 g in dextrose 5 % 50 mL IVPB  Status:  Discontinued     1 g 100 mL/hr over 30 Minutes Intravenous Every 24 hours 10/03/13 0151 10/03/13 0736   10/03/13 0200  azithromycin (ZITHROMAX) 500 mg in dextrose 5 % 250 mL IVPB  Status:  Discontinued     500 mg 250 mL/hr over 60 Minutes Intravenous Every 24 hours 10/03/13 0151 10/03/13 0736   10/03/13 0000  levofloxacin (LEVAQUIN) IVPB 750 mg  Status:  Discontinued     750 mg 100 mL/hr over 90 Minutes Intravenous  Once 10/02/13 2355 10/02/13 2358   10/03/13 0000  piperacillin-tazobactam (ZOSYN) IVPB 3.375 g     3.375 g 100 mL/hr over 30 Minutes Intravenous  Once 10/02/13 2358 10/03/13 0038   10/03/13 0000  vancomycin (VANCOCIN) IVPB 1000 mg/200 mL premix      1,000 mg 200 mL/hr over 60 Minutes Intravenous  Once 10/02/13 2358 10/03/13 0149      Total days of antibiotics: 4 (zosyn)          Social History:  reports that he quit smoking about 35 years ago. His  smoking use included Cigarettes. He has a 150 pack-year smoking history. He has never used smokeless tobacco. He reports that he does not drink alcohol or use illicit drugs.  Family History  Problem Relation Age of Onset  . Cancer Father     stomach  . Cancer Sister     lung    General ROS: normal urine (urostomy), loose BM, no numbess/weakness LE, no new soaps or shampoos, see HPI.   Blood pressure 167/78, pulse 64, temperature 97.8 F (36.6 C), temperature source Oral, resp. rate 18, height _0  (1.753 m), weight 80.4 kg (177 lb 4 oz), SpO2 92.00%. General appearance: alert, cooperative and no distress Eyes: negative findings: pupils equal, round, reactive to light and accomodation, positive findings: conjunctiva: injected Throat: normal findings: oropharynx pink & moist without lesions or evidence of thrush Neck: no adenopathy and supple, symmetrical, trachea midline Lungs: clear to auscultation bilaterally Heart: regular rate and rhythm Abdomen: normal findings: bowel sounds normal and soft, non-tender Skin: large rash over L scapula that crosses midline. hyperemic, blanches. non-tender. warm.   Results for orders placed during the hospital encounter of 10/02/13 (from the past 48 hour(s))  PROTIME-INR     Status: Abnormal   Collection Time    10/04/13  3:00 AM      Result Value Ref Range   Prothrombin Time 16.1 (*) 11.6 - 15.2 seconds   INR 1.32  0.00 - 1.49  CBC     Status: Abnormal   Collection Time    10/04/13  3:00 AM      Result Value Ref Range   WBC 13.1 (*) 4.0 - 10.5 K/uL   RBC 3.59 (*) 4.22 - 5.81 MIL/uL   Hemoglobin 11.0 (*) 13.0 - 17.0 g/dL   HCT 33.5 (*) 39.0 - 52.0 %   MCV 93.3  78.0 - 100.0 fL   MCH 30.6  26.0 - 34.0 pg   MCHC 32.8  30.0 - 36.0  g/dL   RDW 15.7 (*) 11.5 - 15.5 %   Platelets 186  150 - 400 K/uL  BASIC METABOLIC PANEL     Status: Abnormal   Collection Time    10/04/13  3:00 AM      Result Value Ref Range   Sodium 140  137 - 147 mEq/L   Potassium 4.1  3.7 - 5.3 mEq/L   Chloride 107  96 - 112 mEq/L   CO2 21  19 - 32 mEq/L   Glucose, Bld 102 (*) 70 - 99 mg/dL   BUN 17  6 - 23 mg/dL   Creatinine, Ser 1.01  0.50 - 1.35 mg/dL   Calcium 8.3 (*) 8.4 - 10.5 mg/dL   GFR calc non Af Amer 67 (*) >90 mL/min   GFR calc Af Amer 77 (*) >90 mL/min   Comment: (NOTE)     The eGFR has been calculated using the CKD EPI equation.     This calculation has not been validated in all clinical situations.     eGFR's persistently <90 mL/min signify possible Chronic Kidney     Disease.  GRAM STAIN     Status: None   Collection Time    10/04/13 11:38 AM      Result Value Ref Range   Specimen Description URINE, RANDOM     Special Requests NONE     Gram Stain       Value: WBC PRESENT, PREDOMINANTLY MONONUCLEAR     NO ORGANISMS SEEN     CYTOSPIN  SLIDE   Report Status 10/04/2013 FINAL    LEGIONELLA ANTIGEN, URINE     Status: None   Collection Time    10/04/13 11:38 AM      Result Value Ref Range   Specimen Description URINE, RANDOM     Special Requests NONE     Legionella Antigen, Urine       Value: Negative for Legionella pneumophilia serogroup 1     Performed at [facility]   Report Status 10/05/2013 FINAL    PROTIME-INR     Status: Abnormal   Collection Time    10/05/13  4:15 AM      Result Value Ref Range   Prothrombin Time 15.5 (*) 11.6 - 15.2 seconds   INR 1.26  0.00 - 1.49  CBC     Status: Abnormal   Collection Time    10/05/13  4:15 AM      Result Value Ref Range   WBC 11.3 (*) 4.0 - 10.5 K/uL   RBC 3.74 (*) 4.22 - 5.81 MIL/uL   Hemoglobin 11.4 (*) 13.0 - 17.0 g/dL   HCT 34.5 (*) 39.0 - 52.0 %   MCV 92.2  78.0 - 100.0 fL   MCH 30.5  26.0 - 34.0 pg   MCHC 33.0  30.0 - 36.0 g/dL   RDW 15.6 (*) 11.5 -  15.5 %   Platelets 212  150 - 400 K/uL  BASIC METABOLIC PANEL     Status: Abnormal   Collection Time    10/05/13  4:15 AM      Result Value Ref Range   Sodium 141  137 - 147 mEq/L   Potassium 3.9  3.7 - 5.3 mEq/L   Chloride 107  96 - 112 mEq/L   CO2 23  19 - 32 mEq/L   Glucose, Bld 88  70 - 99 mg/dL   BUN 15  6 - 23 mg/dL   Creatinine, Ser 1.01  0.50 - 1.35 mg/dL   Calcium 8.5  8.4 - 10.5 mg/dL   GFR calc non Af Amer 67 (*) >90 mL/min   GFR calc Af Amer 77 (*) >90 mL/min   Comment: (NOTE)     The eGFR has been calculated using the CKD EPI equation.     This calculation has not been validated in all clinical situations.     eGFR's persistently <90 mL/min signify possible Chronic Kidney     Disease.      Component Value Date/Time   SDES URINE, RANDOM 10/04/2013 1138   SDES URINE, RANDOM 10/04/2013 1138   SPECREQUEST NONE 10/04/2013 1138   SPECREQUEST NONE 10/04/2013 1138   CULT  Value: Multiple bacterial morphotypes present, none predominant. Suggest appropriate recollection if clinically indicated. Performed at [facility] 10/03/2013 0015   REPTSTATUS 10/04/2013 FINAL 10/04/2013 1138   REPTSTATUS 10/05/2013 FINAL 10/04/2013 1138   No results found. Recent Results (from the past 240 hour(s))  CULTURE, BLOOD (ROUTINE X 2)     Status: None   Collection Time    10/02/13 12:10 AM      Result Value Ref Range Status   Specimen Description BLOOD RIGHT ARM   Final   Special Requests BOTTLES DRAWN AEROBIC AND ANAEROBIC 10CC EACH   Final   Culture  Setup Time     Final   Value: 10/03/2013 10:41     Performed at [facility]   Culture     Final   Value:  BLOOD CULTURE RECEIVED NO GROWTH TO DATE CULTURE WILL BE HELD FOR 5 DAYS BEFORE ISSUING A FINAL NEGATIVE REPORT     Performed at [facility]   Report Status PENDING   Incomplete  CULTURE, BLOOD (ROUTINE X 2)     Status: None   Collection Time    10/02/13 12:15 AM      Result Value Ref Range Status   Specimen  Description BLOOD RIGHT HAND   Final   Special Requests BOTTLES DRAWN AEROBIC ONLY 10CC   Final   Culture  Setup Time     Final   Value: 10/03/2013 10:41     Performed at [facility]   Culture     Final   Value: VIRIDANS STREPTOCOCCUS     Note: CRITICAL RESULT CALLED TO, READ BACK BY AND VERIFIED WITH: TIM IRBY RN _0  10/04/13 VINCJ     Performed at [facility]   Report Status 10/05/2013 FINAL   Final  URINE CULTURE     Status: None   Collection Time    10/03/13 12:15 AM      Result Value Ref Range Status   Specimen Description URINE, RANDOM   Final   Special Requests NONE   Final   Culture  Setup Time     Final   Value: 10/03/2013 10:42     Performed at [place]     Final   Value: >=100,000 COLONIES/ML     Performed at [facility]   Culture     Final   Value: Multiple bacterial morphotypes present, none predominant. Suggest appropriate recollection if clinically indicated.     Performed at [facility]   Report Status 10/05/2013 FINAL   Final  MRSA PCR SCREENING     Status: None   Collection Time    10/03/13  2:34 AM      Result Value Ref Range Status   MRSA by PCR NEGATIVE  NEGATIVE Final   Comment:            The GeneXpert MRSA Assay (FDA     approved for NASAL specimens     only), is one component of a     comprehensive MRSA colonization     surveillance program. It is not     intended to diagnose MRSA     infection nor to guide or     monitor treatment for     MRSA infections.  GRAM STAIN     Status: None   Collection Time    10/04/13 11:38 AM      Result Value Ref Range Status   Specimen Description URINE, RANDOM   Final   Special Requests NONE   Final   Gram Stain     Final   Value: WBC PRESENT, PREDOMINANTLY MONONUCLEAR     NO ORGANISMS SEEN     CYTOSPIN SLIDE   Report Status 10/04/2013 FINAL   Final      10/05/2013, 3:04 PM     LOS: 3 days     **Disclaimer: This note may have been  dictated with voice recognition software. Similar sounding words can inadvertently be transcribed and this note may contain transcription errors which may not have been corrected upon publication of note.**

## 2013-10-05 NOTE — Progress Notes (Signed)
ANTICOAGULATION & ANTIBIOTIC CONSULT NOTE - Follow Up Consult  Pharmacy Consult for Coumadin and Zosyn Indication: atrial fibrillation and CAP  Allergies  Allergen Reactions  . Codeine Nausea And Vomiting  . Crestor [Rosuvastatin Calcium] Other (See Comments)    Leg cramps    Patient Measurements: Height: 5\' 9"  (175.3 cm) Weight: 177 lb 4 oz (80.4 kg) IBW/kg (Calculated) : 70.7  Vital Signs: Temp: 98.2 F (36.8 C) (05/04 0519) Temp src: Oral (05/04 0519) BP: 160/75 mmHg (05/04 0519) Pulse Rate: 76 (05/04 0830)  Labs:  Recent Labs  10/02/13 2349 10/03/13 0201 10/03/13 0805 10/04/13 0300 10/05/13 0415  HGB  --  12.1*  --  11.0* 11.4*  HCT  --  36.2*  --  33.5* 34.5*  PLT  --  236  --  186 212  APTT 34  --   --   --   --   LABPROT 13.6  --   --  16.1* 15.5*  INR 1.06  --   --  1.32 1.26  CREATININE  --  1.00 1.06 1.01 1.01  TROPONINI <0.30  --   --   --   --     Estimated Creatinine Clearance: 55.4 ml/min (by C-G formula based on Cr of 1.01).  Assessment:   On Coumadin prior to admission for afib, but INR was 1.0 on admission.  Has had Coumadin 5 mg daily x 2 days, but INR has leveled off.   Per outpatient notes, prior Coumadin regimen was 5 mg daily except 7.5 mg on Tuesdays and Saturdays.  Outpatient INRs had been at goal for the last several visits.    Strep viridans in 1 of 2 blood cultures.  Day #3 Vanc and Zosyn; Vanc discontinued. Zosyn to continue. ID to see.  Zosyn dose currently infusing (4hr infusion).   Goal of Therapy:  INR 2-3 appropriate Zosyn dose for renal function Monitor platelets by anticoagulation protocol: Yes   Plan:   Coumadin 7.5 mg today.  Continue daily PT/INR.  Continue Zosyn 3.375 grams IV q8hrs (each infused over 4 hours).  Follow up ID input.  Arty Baumgartner, [place] Pager: [phone] 10/05/2013,10:28 AM

## 2013-10-05 NOTE — Progress Notes (Signed)
[facility] PROGRESS NOTE  Peter Richards UXN:[id number] DOB: 1928-07-25 DOA: 10/02/2013 PCP: Mathews Argyle, MD  Assessment/Plan: 1-PNA; Received 3 days of vancomycin.  Continue with  Zosyn day 3. WBC decrease to 11 from 30.  2-Blood culture with Streptococcus Viridans: continue with Zosyn. ID consulted.   Sepsis; Resolved. NSL. BP stable.   3-A fib; Continue with metoprolol. Coumadin per pharmacy.   4-Back pain; CT myelogram showed; 1. Prior L4-5 posterior decompression and fusion with decompressed  spinal canal.  Multilevel degenerative disc disease and facet arthrosis resulting in mild to moderate bilateral neural foraminal narrowing as above.  -PT consult. Home health PT.  -Ortho consulted.  -Will try Fentanyl  Patch.   5-UTI. Transition Levaquin. Urine culture with multiple bacterial morphotype.   6-Hyperkalemia; repeat B-met. Probably hemolysis.   7-Diarrhea; no further episode.   Code Status: Full Code.  Family Communication: Care discussed with patient.  Disposition Plan: to be determine.    Consultants:  none  Procedures:  none  Antibiotics:  Vancomycin 5-02  Zosyn 5-02.   HPI/Subjective: He is irritated today. Multiple IV access changed.  Still with back pain. No dyspnea, no worning cough. Sore throat better.     Objective: Filed Vitals:   10/05/13 0830  BP:   Pulse: 76  Temp:   Resp:     Intake/Output Summary (Last 24 hours) at 10/05/13 0902 Last data filed at 10/05/13 0716  Gross per 24 hour  Intake    520 ml  Output   2300 ml  Net  -1780 ml   Filed Weights   10/02/13 2131 10/03/13 0239  Weight: 83.915 kg (185 lb) 80.4 kg (177 lb 4 oz)    Exam:   General:  Alert , no distress.   Cardiovascular: S 1, S 2 RRR  Respiratory: Crackles bases.   Abdomen: Bs present, soft, NT, urostomy in place.   Musculoskeletal: no edema.   Data Reviewed: Basic Metabolic Panel:  Recent Labs Lab 10/02/13 2348 10/03/13 0201  10/03/13 0805 10/04/13 0300 10/05/13 0415  NA 138 135* 139 140 141  K 4.2 6.0* 4.6 4.1 3.9  CL 100 100 103 107 107  CO2 $Re'21 21 21 21 23  'AOO$ GLUCOSE 112* 149* 98 102* 88  BUN $Re'20 21 21 17 15  'oQb$ CREATININE 0.97 1.00 1.06 1.01 1.01  CALCIUM 9.4 8.1* 8.2* 8.3* 8.5   Liver Function Tests:  Recent Labs Lab 10/02/13 2348  AST 15  ALT 10  ALKPHOS 98  BILITOT 0.6  PROT 7.7  ALBUMIN 3.7   No results found for this basename: LIPASE, AMYLASE,  in the last 168 hours No results found for this basename: AMMONIA,  in the last 168 hours CBC:  Recent Labs Lab 10/02/13 2348 10/03/13 0201 10/04/13 0300 10/05/13 0415  WBC 27.6* 30.3* 13.1* 11.3*  NEUTROABS 23.7*  --   --   --   HGB 13.7 12.1* 11.0* 11.4*  HCT 40.7 36.2* 33.5* 34.5*  MCV 91.5 92.1 93.3 92.2  PLT 245 236 186 212   Cardiac Enzymes:  Recent Labs Lab 10/02/13 2349  TROPONINI <0.30   BNP (last 3 results) No results found for this basename: PROBNP,  in the last 8760 hours CBG: No results found for this basename: GLUCAP,  in the last 168 hours  Recent Results (from the past 240 hour(s))  CULTURE, BLOOD (ROUTINE X 2)     Status: None   Collection Time    10/02/13 12:10 AM  Result Value Ref Range Status   Specimen Description BLOOD RIGHT ARM   Final   Special Requests BOTTLES DRAWN AEROBIC AND ANAEROBIC 10CC EACH   Final   Culture  Setup Time     Final   Value: 10/03/2013 10:41     Performed at [facility]   Culture     Final   Value:        BLOOD CULTURE RECEIVED NO GROWTH TO DATE CULTURE WILL BE HELD FOR 5 DAYS BEFORE ISSUING A FINAL NEGATIVE REPORT     Performed at [facility]   Report Status PENDING   Incomplete  CULTURE, BLOOD (ROUTINE X 2)     Status: None   Collection Time    10/02/13 12:15 AM      Result Value Ref Range Status   Specimen Description BLOOD RIGHT HAND   Final   Special Requests BOTTLES DRAWN AEROBIC ONLY 10CC   Final   Culture  Setup Time     Final   Value: 10/03/2013  10:41     Performed at [facility]   Culture     Final   Value: STREPTOCOCCUS SPECIES     Note: CRITICAL RESULT CALLED TO, READ BACK BY AND VERIFIED WITH: TIM IRBY RN $Remov'@1217AM'[email or web address]$  10/04/13 VINCJ     Performed at [facility]   Report Status PENDING   Incomplete  URINE CULTURE     Status: None   Collection Time    10/03/13 12:15 AM      Result Value Ref Range Status   Specimen Description URINE, RANDOM   Final   Special Requests NONE   Final   Culture  Setup Time     Final   Value: 10/03/2013 10:42     Performed at [place]     Final   Value: >=100,000 COLONIES/ML     Performed at [facility]   Culture     Final   Value: Multiple bacterial morphotypes present, none predominant. Suggest appropriate recollection if clinically indicated.     Performed at [facility]   Report Status 10/05/2013 FINAL   Final  MRSA PCR SCREENING     Status: None   Collection Time    10/03/13  2:34 AM      Result Value Ref Range Status   MRSA by PCR NEGATIVE  NEGATIVE Final   Comment:            The GeneXpert MRSA Assay (FDA     approved for NASAL specimens     only), is one component of a     comprehensive MRSA colonization     surveillance program. It is not     intended to diagnose MRSA     infection nor to guide or     monitor treatment for     MRSA infections.  GRAM STAIN     Status: None   Collection Time    10/04/13 11:38 AM      Result Value Ref Range Status   Specimen Description URINE, RANDOM   Final   Special Requests NONE   Final   Gram Stain     Final   Value: WBC PRESENT, PREDOMINANTLY MONONUCLEAR     NO ORGANISMS SEEN     CYTOSPIN SLIDE   Report Status 10/04/2013 FINAL   Final     Studies: No results found.  Scheduled Meds: . escitalopram  20 mg Oral Daily  .  fentaNYL  25 mcg Transdermal Q72H  . gabapentin  300 mg Oral TID  . levofloxacin  750 mg Oral Daily  . metoprolol succinate  25 mg Oral Daily  .  simvastatin  10 mg Oral QHS  . Warfarin - Pharmacist Dosing Inpatient   Does not apply q1800  . zolpidem  5 mg Oral QHS   Continuous Infusions:    Principal Problem:   Community acquired pneumonia Active Problems:   ATRIAL FIBRILLATION, HX OF   Sepsis    Time spent: 30 minutes.     [facility] [facility] Pager [phone]. If 7PM-7AM, please contact night-coverage at [email or web address], password [facility] 10/05/2013, 9:02 AM  LOS: 3 days

## 2013-10-05 NOTE — Progress Notes (Signed)
Notified Rogue Bussing, NP that pt has sore throat. NP stated he would put in order for Cepacol throat lozenges. Will continue to monitor pt. Ranelle Oyster, RN

## 2013-10-05 NOTE — Evaluation (Signed)
Physical Therapy Evaluation Patient Details Name: Peter Richards MRN: [id number] DOB: 1928/09/08 Today's Date: 10/05/2013   History of Present Illness  80 yo admitted from [facility] independent living with R flank pain, cough, confusion and SOB. CAP. Pt with chronic back pain and did have a myelogram 5/1  Clinical Impression  Pt very pleasant and moving well but with noted balance and endurance deficits. Pt will benefit from acute therapy to maximize activity, strength and function to increase independence and address above and below deficits. Pt states he had OPPT several months ago, has not maintained HEP and has declined in function and being able to walk out of his apt since then. Pt educated for HEP, pursed lip breathing and plan.     Follow Up Recommendations Home health PT    Equipment Recommendations  Rolling walker with 5" wheels    Recommendations for Other Services       Precautions / Restrictions Precautions Precautions: Fall      Mobility  Bed Mobility               General bed mobility comments: pt in bathroom on arrival. Verbally instructed for back precautions with bed mobility to assist with decreasing chronic pain  Transfers Overall transfer level: Modified independent                  Ambulation/Gait Ambulation/Gait assistance: Supervision Ambulation Distance (Feet): 160 Feet Assistive device: Rolling walker (2 wheeled) Gait Pattern/deviations: Step-through pattern;Decreased stride length;Trunk flexed   Gait velocity interpretation: Below normal speed for age/gender General Gait Details: Pt initated gait with cane but automatically reaching for environmental supports as well. Switched to cane with improved balance and stability with cues for posture and position in RW  Stairs            Wheelchair Mobility    Modified Rankin (Stroke Patients Only)       Balance Overall balance assessment: Needs assistance   Sitting  balance-Leahy Scale: Fair       Standing balance-Leahy Scale: Poor                               Pertinent Vitals/Pain Chronic back soreness, unrated sats 90-92% on RA with activity with HR 76-80    Home Living Family/patient expects to be discharged to:: Private residence Living Arrangements: Spouse/significant other Available Help at Discharge: Family Type of Home: Apartment Home Access: Level entry     Home Layout: One level Home Equipment: Environmental consultant - 4 wheels;Cane - single point      Prior Function Level of Independence: Independent with assistive device(s)         Comments: Wife cooks breakfast/lunch and they go to [employer] for dinner. pt states he uses a cane and furniture walks and was performing ADLs on his own     Hand Dominance        Extremity/Trunk Assessment   Upper Extremity Assessment: Overall WFL for tasks assessed           Lower Extremity Assessment: Overall WFL for tasks assessed      Cervical / Trunk Assessment: Kyphotic  Communication   Communication: No difficulties  Cognition Arousal/Alertness: Awake/alert Behavior During Therapy: WFL for tasks assessed/performed Overall Cognitive Status: Within Functional Limits for tasks assessed                      General Comments  Exercises General Exercises - Lower Extremity Long Arc Quad: AROM;Seated;Both;10 reps Toe Raises: AROM;Seated;Both;10 reps Heel Raises: AROM;Seated;Both;10 reps      Assessment/Plan    PT Assessment Patient needs continued PT services  PT Diagnosis Difficulty walking   PT Problem List Decreased balance;Decreased mobility;Decreased activity tolerance;Decreased knowledge of use of DME  PT Treatment Interventions Gait training;DME instruction;Balance training;Functional mobility training;Therapeutic activities;Therapeutic exercise;Patient/family education   PT Goals (Current goals can be found in the Care Plan section) Acute  Rehab PT Goals Patient Stated Goal: return to walking outside with less back pain PT Goal Formulation: With patient Time For Goal Achievement: 10/19/13 Potential to Achieve Goals: Good    Frequency Min 3X/week   Barriers to discharge Decreased caregiver support      Co-evaluation               End of Session   Activity Tolerance: Patient limited by fatigue Patient left: in chair;with call bell/phone within reach Nurse Communication: Mobility status         Time: 0728-0746 PT Time Calculation (min): 18 min   Charges:   PT Evaluation $Initial PT Evaluation Tier I: 1 Procedure PT Treatments $Therapeutic Activity: 8-22 mins   PT G Codes:          Dontarius Sheley B Brenn Deziel 10/05/2013, 8:35 AM  Elwyn Reach, [place]

## 2013-10-05 NOTE — Progress Notes (Signed)
Patient has red patch on left upper back. Spreading to right.

## 2013-10-05 NOTE — Progress Notes (Signed)
Utilization review completed.  

## 2013-10-05 NOTE — Evaluation (Signed)
Occupational Therapy Evaluation Patient Details Name: Peter Richards MRN: [id number] DOB: 08-22-1928 Today's Date: 10/05/2013    History of Present Illness 80 yo admitted from [facility] independent living with R flank pain, cough, confusion and SOB. CAP. Pt with chronic back pain and did have a myelogram 5/1   Clinical Impression   Pt is at set up/sup - min guard A level with ADLs due to decreased balance. Pt is Mod I with transfers. No further acute OT services are indicated at this time. Pt to continue with acute PT services to functional mobility safety    Follow Up Recommendations  No OT follow up;Supervision - Intermittent    Equipment Recommendations  None recommended by OT    Recommendations for Other Services       Precautions / Restrictions Precautions Precautions: Fall Restrictions Weight Bearing Restrictions: No      Mobility Bed Mobility Overal bed mobility: Needs Assistance Bed Mobility: Sit to Supine       Sit to supine: Min guard   General bed mobility comments: pt seated in front of sink upon arrival. Min guard A with LEs back onto bed  Transfers Overall transfer level: Modified independent Equipment used: Rolling walker (2 wheeled)                  Balance   Sitting-balance support: No upper extremity supported;Feet supported Sitting balance-Leahy Scale: Good     Standing balance support: Single extremity supported;Bilateral upper extremity supported;During functional activity Standing balance-Leahy Scale: Poor                              ADL Overall ADL's : Needs assistance/impaired     Grooming: Wash/dry hands;Wash/dry face;Supervision/safety;Set up;Min guard;Standing   Upper Body Bathing: Set up;Sitting   Lower Body Bathing: Set up;Supervison/ safety;Min guard;Sit to/from stand   Upper Body Dressing : Set up;Sitting   Lower Body Dressing: Supervision/safety;Set up;Min guard;Sit to/from stand   Toilet  Transfer: [place] and Hygiene: Supervision/safety;Min guard;Sit to/from stand       Functional mobility during ADLs: Supervision/safety;Min guard General ADL Comments: min guard A with sit - stand/stading ADLs due to decreased balance     Vision  wears reading glasses                              Pertinent Vitals/Pain 7/10 level of discomfort per pt report, VSS     Hand Dominance Right   Extremity/Trunk Assessment Upper Extremity Assessment Upper Extremity Assessment: Overall WFL for tasks assessed   Lower Extremity Assessment Lower Extremity Assessment: Defer to PT evaluation   Cervical / Trunk Assessment Cervical / Trunk Assessment: Kyphotic   Communication Communication Communication: No difficulties   Cognition Arousal/Alertness: Awake/alert Behavior During Therapy: WFL for tasks assessed/performed Overall Cognitive Status: Within Functional Limits for tasks assessed                                         Home Living Family/patient expects to be discharged to:: Private residence Living Arrangements: Spouse/significant other Available Help at Discharge: Family Type of Home: Apartment Home Access: Level entry     Home Layout: One level     Bathroom Shower/Tub: Teacher, early years/pre: Handicapped height  Home Equipment: Pana - 4 wheels;Cane - single point   Additional Comments: Pt and his wife reside at [name] in independent living cottage      Prior Functioning/Environment Level of Independence: Independent with assistive device(s)        Comments: Wife cooks breakfast/lunch and they go to [employer] for dinner. pt states he uses a cane and furniture walks and was performing ADLs on his own    OT Diagnosis:     OT Problem List:     OT Treatment/Interventions:      OT Goals(Current goals can be found in the care plan section) Acute Rehab OT  Goals Patient Stated Goal: go home OT Goal Formulation: With patient/family  OT Frequency:     Barriers to D/C:  none                        End of Session Equipment Utilized During Treatment: Gait belt;Rolling walker;Other (comment) (BSC)  Activity Tolerance: Patient limited by fatigue;Patient limited by pain Patient left: in bed;with call bell/phone within reach;with family/visitor present   Time: 6283-1517 OT Time Calculation (min): 21 min Charges:  OT General Charges $OT Visit: 1 Procedure OT Evaluation $Initial OT Evaluation Tier I: 1 Procedure OT Treatments $Therapeutic Activity: 8-22 mins G-Codes:    Mosetta Putt 10/18/2013, 1:13 PM

## 2013-10-06 DIAGNOSIS — M545 Low back pain, unspecified: Secondary | ICD-10-CM

## 2013-10-06 DIAGNOSIS — J189 Pneumonia, unspecified organism: Secondary | ICD-10-CM

## 2013-10-06 DIAGNOSIS — I4891 Unspecified atrial fibrillation: Secondary | ICD-10-CM

## 2013-10-06 DIAGNOSIS — I059 Rheumatic mitral valve disease, unspecified: Secondary | ICD-10-CM | POA: Diagnosis not present

## 2013-10-06 DIAGNOSIS — Z950 Presence of cardiac pacemaker: Secondary | ICD-10-CM

## 2013-10-06 LAB — PROTIME-INR
INR: 1.23 (ref 0.00–1.49)
Prothrombin Time: 15.2 seconds (ref 11.6–15.2)

## 2013-10-06 MED ORDER — AMOXICILLIN-POT CLAVULANATE 875-125 MG PO TABS: 1.0000 | ORAL_TABLET | Freq: Two times a day (BID) | ORAL | Status: DC

## 2013-10-06 MED ORDER — AMOXICILLIN-POT CLAVULANATE 875-125 MG PO TABS
1.0000 | ORAL_TABLET | Freq: Two times a day (BID) | ORAL | Status: DC
  Administered 2013-10-06: 1 via ORAL
  Filled 2013-10-06 (×2): qty 1

## 2013-10-06 MED ORDER — WARFARIN SODIUM 5 MG PO TABS: 5.0000 mg | ORAL_TABLET | Freq: Every day | ORAL | Status: DC

## 2013-10-06 MED ORDER — FENTANYL 25 MCG/HR TD PT72: 25.0000 ug | MEDICATED_PATCH | TRANSDERMAL | Status: DC

## 2013-10-06 NOTE — Consult Note (Signed)
Reason for Consult:Chronic LBP and BIL leg px with ambulation Referring Physician: Medicine Team, following current pneumonia/sepsis  TAUREAN JU is an 80 y.o. male.  HPI: Mr Wambold presented to the hospital after obtaining his CT Myelogram of his lumbar spine with disorientation, fever and SOB. He was diagnosed with sepsis and pneumonia and we were consulted over the weekend for his chronic LBP and BIL leg pain that we ordered the CT Myelogram for. This is a chronic issue that we have been following. He is status post a previous lumbar decompression for spinal stenosis that he did very well from. He states his current leg pain is "different" and much more diffuse. His LBP is stable and livable, he is most concerned about his legs. He states he cannot walk >61mn without substantial leg pain. He is curious about his CT Myelogram results. He is on ABX therapy and progressing well otherwise. He is eager to return to his assisted living facility   Past Medical History  Diagnosis Date  . Hyperlipidemia   . Orthostatic hypotension   . Bradycardia   . Syncope     s/p PTVP  . Myocardial infarction     1981  . CAD (coronary artery disease)     prior CABG in 2002 negative Myoview in 2009  . Hypertension   . Stroke     TIA  2007    . GERD (gastroesophageal reflux disease)   . Headache(784.0)   . Arthritis     OSTEOPOROSIS  . Aortic stenosis, mild     by 01/08/08 echo  . Chronic anticoagulation   . Depression   . AAA (abdominal aortic aneurysm)   . Heart attack   . Pacemaker     medtronic pacer  . History of cervical spinal surgery     [place]  .[place] Kitchen[place]OH (hard of hearing)     BILATERAL HEARING AIDS  . Prostate cancer   . Basal cell adenocarcinoma     'bottom lip" (11/06/2012)  . Bladder cancer   . Attention to urostomy     "since 2006" (11/06/2012)  . Atrial fibrillation     on amiodarone and has PTVP in place  . Exertional shortness of breath   . Chronic radicular pain of  lower back   . Anxiety     Past Surgical History  Procedure Laterality Date  . Insert / replace / remove pacemaker      11/2009  DR BRODIE    . Prostatectomy      BLADDER ALSO REMOVED (OSTOMY BAG)  2006  FOR CANCER     . Abdominal aortic aneurysm repair  1999    STENT PLACED    . Cataract extraction w/ intraocular lens  implant, bilateral    . Appendectomy      1959  . Revision urostomy cutaneous    . Revision urostomy cutaneous  2006    Urostomy bag placed on right lower abdomen  . Vertebroplasty  08/22/2011    Procedure: VERTEBROPLASTY;  Surgeon: KWinfield Cunas MD;  [facility];  Service: Neurosurgery;  Laterality: N/A;  Lumbar Two vertebroplasty  . Cholecystectomy      4 YRS AGO  . Posterior lumbar fusion  11/06/2012  . Coronary artery bypass graft  1999    CABG X2  . Skin cancer excision      "bottom lip" (11/06/2012)  . Bladder surgery      "for cancer" (11/06/2012)  . Lumbar laminectomy/decompression microdiscectomy  Bilateral 11/06/2012    Procedure: LUMBAR LAMINECTOMY/DECOMPRESSION MICRODISCECTOMY;  Surgeon: Sinclair Ship, MD;  Location: [facility];  Service: Orthopedics;  Laterality: Bilateral;  Lumbar 2-5 decompression    Family History  Problem Relation Age of Onset  . Cancer Father     stomach  . Cancer Sister     lung    Social History:  reports that he quit smoking about 35 years ago. His smoking use included Cigarettes. He has a 150 pack-year smoking history. He has never used smokeless tobacco. He reports that he does not drink alcohol or use illicit drugs.  Allergies:  Allergies  Allergen Reactions  . Codeine Nausea And Vomiting  . Crestor [Rosuvastatin Calcium] Other (See Comments)    Leg cramps    Current facility-administered medications:amoxicillin-clavulanate (AUGMENTIN) 875-125 MG per tablet 1 tablet, 1 tablet, Oral, Q12H, Belkys A Regalado, MD, 1 tablet at 10/06/13 1440;  escitalopram (LEXAPRO) tablet 20 mg, 20 mg, Oral, Daily, Etta Quill, DO, 20 mg at 10/06/13 4287;  fentaNYL (DURAGESIC - dosed mcg/hr) patch 25 mcg, 25 mcg, Transdermal, Q72H, Belkys A Regalado, MD, 25 mcg at 10/05/13 1046 gabapentin (NEURONTIN) capsule 300 mg, 300 mg, Oral, TID, Etta Quill, DO, 300 mg at 10/06/13 1544;  HYDROcodone-acetaminophen (NORCO/VICODIN) 5-325 MG per tablet 1 tablet, 1 tablet, Oral, Q4H PRN, Etta Quill, DO, 1 tablet at 10/06/13 0602;  menthol-cetylpyridinium (CEPACOL) lozenge 3 mg, 1 lozenge, Oral, PRN, Dianne Dun, NP, 3 mg at 10/05/13 0236 metoprolol succinate (TOPROL-XL) 24 hr tablet 25 mg, 25 mg, Oral, Daily, Jared M Gardner, DO, 25 mg at 10/06/13 6811;  simvastatin (ZOCOR) tablet 10 mg, 10 mg, Oral, QHS, Jared M Gardner, DO, 10 mg at 10/05/13 2120;  tiotropium [facility]) inhalation capsule 18 mcg, 18 mcg, Inhalation, Daily PRN, Etta Quill, DO;  Warfarin - Pharmacist Dosing Inpatient, , Does not apply, q1800, Elmarie Shiley, MD zolpidem (AMBIEN) tablet 5 mg, 5 mg, Oral, QHS, Jared M Gardner, DO, 5 mg at 10/05/13 2119  Results for orders placed during the hospital encounter of 10/02/13 (from the past 48 hour(s))  PROTIME-INR     Status: Abnormal   Collection Time    10/05/13  4:15 AM      Result Value Ref Range   Prothrombin Time 15.5 (*) 11.6 - 15.2 seconds   INR 1.26  0.00 - 1.49  CBC     Status: Abnormal   Collection Time    10/05/13  4:15 AM      Result Value Ref Range   WBC 11.3 (*) 4.0 - 10.5 K/uL   RBC 3.74 (*) 4.22 - 5.81 MIL/uL   Hemoglobin 11.4 (*) 13.0 - 17.0 g/dL   HCT 34.5 (*) 39.0 - 52.0 %   MCV 92.2  78.0 - 100.0 fL   MCH 30.5  26.0 - 34.0 pg   MCHC 33.0  30.0 - 36.0 g/dL   RDW 15.6 (*) 11.5 - 15.5 %   Platelets 212  150 - 400 K/uL  BASIC METABOLIC PANEL     Status: Abnormal   Collection Time    10/05/13  4:15 AM      Result Value Ref Range   Sodium 141  137 - 147 mEq/L   Potassium 3.9  3.7 - 5.3 mEq/L   Chloride 107  96 - 112 mEq/L   CO2 23  19 - 32 mEq/L   Glucose, Bld  88  70 - 99 mg/dL   BUN 15  6 -  23 mg/dL   Creatinine, Ser 1.01  0.50 - 1.35 mg/dL   Calcium 8.5  8.4 - 10.5 mg/dL   GFR calc non Af Amer 67 (*) >90 mL/min   GFR calc Af Amer 77 (*) >90 mL/min   Comment: (NOTE)     The eGFR has been calculated using the CKD EPI equation.     This calculation has not been validated in all clinical situations.     eGFR's persistently <90 mL/min signify possible Chronic Kidney     Disease.  CULTURE, BLOOD (ROUTINE X 2)     Status: None   Collection Time    10/05/13  5:04 PM      Result Value Ref Range   Specimen Description BLOOD LEFT ANTECUBITAL     Special Requests BOTTLES DRAWN AEROBIC AND ANAEROBIC 10CC     Culture  Setup Time       Value: 10/05/2013 20:09     Performed at [facility]   Culture       Value:        BLOOD CULTURE RECEIVED NO GROWTH TO DATE CULTURE WILL BE HELD FOR 5 DAYS BEFORE ISSUING A FINAL NEGATIVE REPORT     Performed at [facility]   Report Status PENDING    CULTURE, BLOOD (ROUTINE X 2)     Status: None   Collection Time    10/05/13  5:10 PM      Result Value Ref Range   Specimen Description BLOOD LEFT HAND     Special Requests BOTTLES DRAWN AEROBIC AND ANAEROBIC 10CC     Culture  Setup Time       Value: 10/05/2013 20:09     Performed at [facility]   Culture       Value:        BLOOD CULTURE RECEIVED NO GROWTH TO DATE CULTURE WILL BE HELD FOR 5 DAYS BEFORE ISSUING A FINAL NEGATIVE REPORT     Performed at [facility]   Report Status PENDING    PROTIME-INR     Status: None   Collection Time    10/06/13  7:05 AM      Result Value Ref Range   Prothrombin Time 15.2  11.6 - 15.2 seconds   INR 1.23  0.00 - 1.49    Review of Systems  Constitutional: Negative for fever, chills and weight loss.  Musculoskeletal: Positive for back pain. Negative for falls.  Skin: Negative for itching and rash.  Neurological: Negative for tingling and weakness.   Blood pressure 111/72, pulse 62,  temperature 97.7 F (36.5 C), temperature source Oral, resp. rate 14, height _0  (1.753 m), weight 80.4 kg (177 lb 4 oz), SpO2 91.00%.  Pt laying comfortably in hospital bed Physical Exam  Constitutional: He is oriented to person, place, and time. He appears well-developed and well-nourished.  HENT:  Head: Normocephalic and atraumatic.  Eyes: EOM are normal.  Musculoskeletal: He exhibits tenderness. He exhibits no edema. He has 5/5 TA and EHL strength 2+ DPP.    Neurological: He is alert and oriented to person, place, and time.  Skin: Skin is warm and dry. No rash noted. No erythema.   CT Myelogram: demonstrates excellent previous decompression without any neurologic compression appreciated readily correlating to pts reported symptomatology.   Assessment/Plan:  -Chronic LBP  -Continue current home pain med regimen   -Cont PT/OT while in-pt and resume HEP upon D/C  -F/U in office upon D/C at regular scheduled  appt  -Potential vascular claudication; pt states has been   evaluated regarding this by cardiology already though  -BIL leg pain/cramping with ambulation  -unexplained by pts recent CT Myelogram  -F/U results in office for additional evaluation after d/c      at pts convience   -[facility]   -([place] 10/06/2013, 3:56 PM

## 2013-10-06 NOTE — Care Management Note (Signed)
    Page 1 of 2   10/06/2013     3:06:12 PM CARE MANAGEMENT NOTE 10/06/2013  Patient:  RECE, ZECHMAN   Account Number:  [id number]  Date Initiated:  10/06/2013  Documentation initiated by:  Tomi Bamberger  Subjective/Objective Assessment:   dx pna  admit- from [name] , indep living.     Action/Plan:   pt eval- rec hhpt   Anticipated DC Date:  10/06/2013   Anticipated DC Plan:  [place]  CM consult      [facility] Choice  HOME HEALTH   Choice offered to / List presented to:  C-1 Patient   DME arranged  Vassie Moselle      DME agency  [place] arranged  HH-2 PT      [place] agency  OTHER - SEE NOTE   Status of service:  Completed, signed off Medicare Important Message given?  YES (If response is "NO", the following Medicare IM given date fields will be blank) Date Medicare IM given:  10/05/2013 Date Additional Medicare IM given:  10/05/2013  Discharge Disposition:  [place]  Per UR Regulation:  Reviewed for med. necessity/level of care/duration of stay  If discussed at [place] of Stay Meetings, dates discussed:    Comments:  10/06/13 [place], BSN [phone] patient is from [place], [place] spoke with [place] and [facility] provides physical therapy for their residents, patient states he would like to go with [facility] at  [facility].  NCM faxed orders and face to face to [facility] [phone].

## 2013-10-06 NOTE — Progress Notes (Signed)
Physical Therapy Treatment Patient Details Name: BASSAM DRESCH MRN: [id number] DOB: 1928/09/29 Today's Date: 2013-10-15    History of Present Illness 80 yo admitted from [facility] independent living with R flank pain, cough, confusion and SOB. CAP. Pt with chronic back pain and did have a myelogram 5/1    PT Comments    Pt progressing with gait today and able to tolerate increased activity and exercise. Pt encouraged to use incentive spirometer at home and continue HEP. Will follow.   Follow Up Recommendations  Home health PT     Equipment Recommendations  Rolling walker with 5" wheels    Recommendations for Other Services       Precautions / Restrictions Precautions Precautions: Fall Restrictions Weight Bearing Restrictions: No    Mobility  Bed Mobility Overal bed mobility: Needs Assistance Bed Mobility: Rolling;Sidelying to Sit;Sit to Sidelying Rolling: Supervision Sidelying to sit: Supervision     Sit to sidelying: Supervision General bed mobility comments: cues for sequence to maintain back precautions and ease chronic pain  Transfers Overall transfer level: Modified independent                  Ambulation/Gait Ambulation/Gait assistance: Supervision Ambulation Distance (Feet): 500 Feet Assistive device: Rolling walker (2 wheeled) Gait Pattern/deviations: Step-through pattern;Decreased stride length;Trunk flexed   Gait velocity interpretation: Below normal speed for age/gender General Gait Details: cues for posture and position in RW   Stairs            Wheelchair Mobility    Modified Rankin (Stroke Patients Only)       Balance                                    Cognition Arousal/Alertness: Awake/alert Behavior During Therapy: WFL for tasks assessed/performed Overall Cognitive Status: Within Functional Limits for tasks assessed                      Exercises General Exercises - Lower Extremity Long  Arc Quad: AROM;Seated;Both;15 reps Hip ABduction/ADduction: AROM;Seated;Both;15 reps Hip Flexion/Marching: AROM;Seated;Both;15 reps Toe Raises: AROM;Seated;Both;15 reps Heel Raises: AROM;Seated;Both;15 reps    General Comments        Pertinent Vitals/Pain 3/10 back pain, sats 91% on RA    Home Living                      Prior Function            PT Goals (current goals can now be found in the care plan section) Progress towards PT goals: Progressing toward goals    Frequency       PT Plan Current plan remains appropriate    Co-evaluation             End of Session   Activity Tolerance: Patient tolerated treatment well Patient left: in chair;with call bell/phone within reach;with chair alarm set     Time: 2993-7169 PT Time Calculation (min): 25 min  Charges:  $Gait Training: 8-22 mins $Therapeutic Exercise: 8-22 mins                    G Codes:      Legacie Dillingham B Olan Kurek 10-15-2013, 8:51 AM Elwyn Reach, [place]

## 2013-10-06 NOTE — Discharge Summary (Signed)
Peter Richards to be D/C'd Home with home health per MD order.  Discussed with the patient and all questions fully answered.    Medication List    STOP taking these medications       methocarbamol 500 MG tablet  Commonly known as:  ROBAXIN     nitroGLYCERIN 0.4 MG SL tablet  Commonly known as:  NITROSTAT      TAKE these medications       amoxicillin-clavulanate 875-125 MG per tablet  Commonly known as:  AUGMENTIN  Take 1 tablet by mouth every 12 (twelve) hours.     escitalopram 20 MG tablet  Commonly known as:  LEXAPRO  Take 20 mg by mouth daily.     fentaNYL 25 MCG/HR patch  Commonly known as:  DURAGESIC - dosed mcg/hr  Place 1 patch (25 mcg total) onto the skin every 3 (three) days.     gabapentin 300 MG capsule  Commonly known as:  NEURONTIN  Take 1 capsule (300 mg total) by mouth 3 (three) times daily.     HYDROcodone-acetaminophen 5-325 MG per tablet  Commonly known as:  NORCO/VICODIN  Take 1 tablet by mouth every 4 (four) hours as needed for pain.     metoprolol succinate 25 MG 24 hr tablet  Commonly known as:  TOPROL-XL  Take 1 tablet (25 mg total) by mouth daily.     simvastatin 10 MG tablet  Commonly known as:  ZOCOR  Take 10 mg by mouth at bedtime.     tiotropium 18 MCG inhalation capsule  Commonly known as:  SPIRIVA  Place 18 mcg into inhaler and inhale daily as needed (for shortness of breath).     warfarin 5 MG tablet  Commonly known as:  COUMADIN  Take 1 tablet (5 mg total) by mouth daily.     zolpidem 10 MG tablet  Commonly known as:  AMBIEN  Take 10 mg by mouth at bedtime.        VVS, Skin clean, dry and intact without evidence of skin break down, no evidence of skin tears noted. IV catheter discontinued intact. Site without signs and symptoms of complications. Dressing and pressure applied.  An After Visit Summary was printed and given to the patient.  D/c education completed with patient/family including follow up instructions,  medication list, d/c activities limitations if indicated, with other d/c instructions as indicated by MD - patient able to verbalize understanding, all questions fully answered.   Patient instructed to return to ED, call 911, or call MD for any changes in condition.   Patient escorted via [place], and D/C home via private auto.  [name] 10/06/2013 7:21 PM

## 2013-10-06 NOTE — Progress Notes (Addendum)
INFECTIOUS DISEASE PROGRESS NOTE  ID: Peter Richards is a 80 y.o. male with  Principal Problem:   Community acquired pneumonia Active Problems:   ATRIAL FIBRILLATION, HX OF   Sepsis  Subjective: Feels well, would like to go home.   Abtx:  Anti-infectives   Start     Dose/Rate Route Frequency Ordered Stop   10/06/13 1430  amoxicillin-clavulanate (AUGMENTIN) 875-125 MG per tablet 1 tablet     1 tablet Oral Every 12 hours 10/06/13 1328     10/06/13 0000  amoxicillin-clavulanate (AUGMENTIN) 875-125 MG per tablet  Status:  Discontinued     1 tablet Oral 2 times daily 10/06/13 1304 10/06/13    10/06/13 0000  amoxicillin-clavulanate (AUGMENTIN) 875-125 MG per tablet     1 tablet Oral 2 times daily 10/06/13 1305     10/05/13 1600  piperacillin-tazobactam (ZOSYN) IVPB 3.375 g  Status:  Discontinued     3.375 g 12.5 mL/hr over 240 Minutes Intravenous Every 8 hours 10/05/13 1009 10/06/13 1328   10/05/13 1000  levofloxacin (LEVAQUIN) tablet 750 mg  Status:  Discontinued     750 mg Oral Daily 10/05/13 0900 10/05/13 0910   10/03/13 1200  vancomycin (VANCOCIN) IVPB 750 mg/150 ml premix  Status:  Discontinued     750 mg 150 mL/hr over 60 Minutes Intravenous Every 12 hours 10/03/13 0745 10/05/13 0900   10/03/13 0800  piperacillin-tazobactam (ZOSYN) IVPB 3.375 g  Status:  Discontinued     3.375 g 12.5 mL/hr over 240 Minutes Intravenous Every 8 hours 10/03/13 0745 10/05/13 0900   10/03/13 0200  cefTRIAXone (ROCEPHIN) 1 g in dextrose 5 % 50 mL IVPB  Status:  Discontinued     1 g 100 mL/hr over 30 Minutes Intravenous Every 24 hours 10/03/13 0151 10/03/13 0736   10/03/13 0200  azithromycin (ZITHROMAX) 500 mg in dextrose 5 % 250 mL IVPB  Status:  Discontinued     500 mg 250 mL/hr over 60 Minutes Intravenous Every 24 hours 10/03/13 0151 10/03/13 0736   10/03/13 0000  levofloxacin (LEVAQUIN) IVPB 750 mg  Status:  Discontinued     750 mg 100 mL/hr over 90 Minutes Intravenous  Once 10/02/13 2355  10/02/13 2358   10/03/13 0000  piperacillin-tazobactam (ZOSYN) IVPB 3.375 g     3.375 g 100 mL/hr over 30 Minutes Intravenous  Once 10/02/13 2358 10/03/13 0038   10/03/13 0000  vancomycin (VANCOCIN) IVPB 1000 mg/200 mL premix     1,000 mg 200 mL/hr over 60 Minutes Intravenous  Once 10/02/13 2358 10/03/13 0149      Medications:  Scheduled: . amoxicillin-clavulanate  1 tablet Oral Q12H  . escitalopram  20 mg Oral Daily  . fentaNYL  25 mcg Transdermal Q72H  . gabapentin  300 mg Oral TID  . metoprolol succinate  25 mg Oral Daily  . simvastatin  10 mg Oral QHS  . Warfarin - Pharmacist Dosing Inpatient   Does not apply q1800  . zolpidem  5 mg Oral QHS    Objective: Vital signs in last 24 hours: Temp:  [97.7 F (36.5 C)-98.1 F (36.7 C)] 97.7 F (36.5 C) (05/05 0436) Pulse Rate:  [62-65] 62 (05/05 0938) Resp:  [14] 14 (05/05 0436) BP: (111-138)/(72-75) 111/72 mmHg (05/05 0938) SpO2:  [91 %-92 %] 91 % (05/05 0849)   General appearance: alert, cooperative and no distress Resp: clear to auscultation bilaterally Chest wall: no tenderness, pacer site is clean, non-fluctuant.  Cardio: regular rate and rhythm GI: normal findings:  bowel sounds normal and soft, non-tender Skin: rash over L back/scapula is slightly less intense.   Lab Results  Recent Labs  10/04/13 0300 10/05/13 0415  WBC 13.1* 11.3*  HGB 11.0* 11.4*  HCT 33.5* 34.5*  NA 140 141  K 4.1 3.9  CL 107 107  CO2 21 23  BUN 17 15  CREATININE 1.01 1.01   Liver Panel No results found for this basename: PROT, ALBUMIN, AST, ALT, ALKPHOS, BILITOT, BILIDIR, IBILI,  in the last 72 hours Sedimentation Rate No results found for this basename: ESRSEDRATE,  in the last 72 hours C-Reactive Protein No results found for this basename: CRP,  in the last 72 hours  Microbiology: Recent Results (from the past 240 hour(s))  CULTURE, BLOOD (ROUTINE X 2)     Status: None   Collection Time    10/02/13 12:10 AM      Result  Value Ref Range Status   Specimen Description BLOOD RIGHT ARM   Final   Special Requests BOTTLES DRAWN AEROBIC AND ANAEROBIC 10CC EACH   Final   Culture  Setup Time     Final   Value: 10/03/2013 10:41     Performed at [facility]   Culture     Final   Value:        BLOOD CULTURE RECEIVED NO GROWTH TO DATE CULTURE WILL BE HELD FOR 5 DAYS BEFORE ISSUING A FINAL NEGATIVE REPORT     Performed at [facility]   Report Status PENDING   Incomplete  CULTURE, BLOOD (ROUTINE X 2)     Status: None   Collection Time    10/02/13 12:15 AM      Result Value Ref Range Status   Specimen Description BLOOD RIGHT HAND   Final   Special Requests BOTTLES DRAWN AEROBIC ONLY 10CC   Final   Culture  Setup Time     Final   Value: 10/03/2013 10:41     Performed at [facility]   Culture     Final   Value: VIRIDANS STREPTOCOCCUS     Note: CRITICAL RESULT CALLED TO, READ BACK BY AND VERIFIED WITH: TIM IRBY RN @1217AM  10/04/13 VINCJ     Performed at [facility]   Report Status 10/05/2013 FINAL   Final  URINE CULTURE     Status: None   Collection Time    10/03/13 12:15 AM      Result Value Ref Range Status   Specimen Description URINE, RANDOM   Final   Special Requests NONE   Final   Culture  Setup Time     Final   Value: 10/03/2013 10:42     Performed at [facility]     Final   Value: >=100,000 COLONIES/ML     Performed at [facility]   Culture     Final   Value: Multiple bacterial morphotypes present, none predominant. Suggest appropriate recollection if clinically indicated.     Performed at [facility]   Report Status 10/05/2013 FINAL   Final  MRSA PCR SCREENING     Status: None   Collection Time    10/03/13  2:34 AM      Result Value Ref Range Status   MRSA by PCR NEGATIVE  NEGATIVE Final   Comment:            The GeneXpert MRSA Assay (FDA     approved for NASAL specimens  only), is one component of a      comprehensive MRSA colonization     surveillance program. It is not     intended to diagnose MRSA     infection nor to guide or     monitor treatment for     MRSA infections.  GRAM STAIN     Status: None   Collection Time    10/04/13 11:38 AM      Result Value Ref Range Status   Specimen Description URINE, RANDOM   Final   Special Requests NONE   Final   Gram Stain     Final   Value: WBC PRESENT, PREDOMINANTLY MONONUCLEAR     NO ORGANISMS SEEN     CYTOSPIN SLIDE   Report Status 10/04/2013 FINAL   Final  CULTURE, BLOOD (ROUTINE X 2)     Status: None   Collection Time    10/05/13  5:04 PM      Result Value Ref Range Status   Specimen Description BLOOD LEFT ANTECUBITAL   Final   Special Requests BOTTLES DRAWN AEROBIC AND ANAEROBIC 10CC   Final   Culture  Setup Time     Final   Value: 10/05/2013 20:09     Performed at [facility]   Culture     Final   Value:        BLOOD CULTURE RECEIVED NO GROWTH TO DATE CULTURE WILL BE HELD FOR 5 DAYS BEFORE ISSUING A FINAL NEGATIVE REPORT     Performed at [facility]   Report Status PENDING   Incomplete  CULTURE, BLOOD (ROUTINE X 2)     Status: None   Collection Time    10/05/13  5:10 PM      Result Value Ref Range Status   Specimen Description BLOOD LEFT HAND   Final   Special Requests BOTTLES DRAWN AEROBIC AND ANAEROBIC 10CC   Final   Culture  Setup Time     Final   Value: 10/05/2013 20:09     Performed at [facility]   Culture     Final   Value:        BLOOD CULTURE RECEIVED NO GROWTH TO DATE CULTURE WILL BE HELD FOR 5 DAYS BEFORE ISSUING A FINAL NEGATIVE REPORT     Performed at [facility]   Report Status PENDING   Incomplete    Studies/Results: No results found.   Assessment/Plan: Viridans strep bacteremia Rash Multifocal pneumonia Pacer   Total days of antibiotics: 5 zosyn --> augmentin  Await result of TTE If negative, would send home to complete a total of 28 days.  Given his age, I  am not sure that he would be a good candidate for pacer extraction.  Can f/u with PCP Repeat BCx at PCP The etiology of is rash is ? He will call if he has any difficulties at home.         [name] [facility] (pager) [phone] [email or web address] 10/06/2013, 2:40 PM  LOS: 4 days   **Disclaimer: This note may have been dictated with voice recognition software. Similar sounding words can inadvertently be transcribed and this note may contain transcription errors which may not have been corrected upon publication of note.**

## 2013-10-06 NOTE — Progress Notes (Signed)
ANTICOAGULATION & ANTIBIOTIC CONSULT NOTE - Follow Up Consult  Pharmacy Consult for Coumadin Indication: atrial fibrillation  Allergies  Allergen Reactions  . Codeine Nausea And Vomiting  . Crestor [Rosuvastatin Calcium] Other (See Comments)    Leg cramps    Patient Measurements: Height: 5\' 9"  (175.3 cm) Weight: 177 lb 4 oz (80.4 kg) IBW/kg (Calculated) : 70.7  Vital Signs: Temp: 97.7 F (36.5 C) (05/05 0436) Temp src: Oral (05/05 0436) BP: 111/72 mmHg (05/05 0938) Pulse Rate: 62 (05/05 0938)  Labs:  Recent Labs  10/04/13 0300 10/05/13 0415 10/06/13 0705  HGB 11.0* 11.4*  --   HCT 33.5* 34.5*  --   PLT 186 212  --   LABPROT 16.1* 15.5* 15.2  INR 1.32 1.26 1.23  CREATININE 1.01 1.01  --     Estimated Creatinine Clearance: 55.4 ml/min (by C-G formula based on Cr of 1.01).  Assessment:   On Coumadin prior to admission for afib, but INR was 1.0 on admission.  Has had Coumadin 5 mg daily x 2 days, then 7.5 mg, but INR has leveled off.   Per outpatient notes, prior Coumadin regimen was 5 mg daily except 7.5 mg on Tuesdays and Saturdays. Confirmed this with patient. He had been off Coumadin for a myelogram on 5/2. Outpatient INRs had been at goal for the last several visits.  Goal of Therapy:  INR 2-3 Monitor platelets by anticoagulation protocol: Yes   Plan:   Discharge planned for today.  Patient prefers to take Coumadin at home tonight.  Suggested 7.5 mg, his usual Tuesday dose.  He requested a new prescription, as he is not sure if his Rx vial is at home (daughter brought meds to EMS, and he's not sure if they were returned).  Arty Baumgartner, [place] Pager: [phone] 10/06/2013,3:40 PM

## 2013-10-06 NOTE — Progress Notes (Signed)
  Echocardiogram 2D Echocardiogram has been performed.  Valinda Hoar 10/06/2013, 2:21 PM

## 2013-10-06 NOTE — Discharge Summary (Signed)
Physician Discharge Summary  Peter Richards MVE:[id number] DOB: 01/05/1929 DOA: 10/02/2013  PCP: Mathews Argyle, MD  Admit date: 10/02/2013 Discharge date: 10/06/2013  Time spent: 35 minutes  Recommendations for Outpatient Follow-up:  1. Follow up final results of blood culture done on 5-4 2.   Discharge Diagnoses:    Strep Viridian's Bacteremia   Multifocal PNA.    ATRIAL FIBRILLATION, HX OF   Sepsis   Rash. ? Contact dermitis.?    Discharge Condition: Stable.   Diet recommendation: Heart Healthy  [facility] Weights   10/02/13 2131 10/03/13 0239  Weight: 83.915 kg (185 lb) 80.4 kg (177 lb 4 oz)    History of present illness:  Peter Richards is a 80 y.o. male who [facility] the ED [facility] with several days of R flank pain, cough, confusion and SOB. He is presently disoriented and thinks it is 2008. He does not relate having tried anything for his symptoms. Incidentally, he did have a myelogram performed today.   Hospital Course:  1-PNA, multifocal; Received 3 days of vancomycin and 4 days of Zosyn. WBC decrease to 11 from 30. Improved. Need follow chest x ray document resolution.   2-Streptococcus Viridans Bacteremia: Received IV zosyn. Plan to do ECHO. Plan to treat with Augmentin for 28 days more.  Sepsis; Resolved. NSL. BP stable.   3-A fib; Continue with metoprolol. Coumadin per pharmacy.   4-Back pain; CT myelogram showed; 1. Prior L4-5 posterior decompression and fusion with decompressed  spinal canal. Multilevel degenerative disc disease and facet arthrosis resulting in mild to moderate bilateral neural foraminal narrowing as above.  -PT consult. Home health PT.  -Ortho consulted. Pending their recommendation.  -Will try Fentanyl Patch.  5-UTI.  Urine culture with multiple bacterial morphotype.  6-Hyperkalemia; repeat B-met. Probably hemolysis.  7-Diarrhea; no further episode.  8-Rash; could be contact dermatitis, cellullitis. Will  continue with antibiotics. Needs to follow up with dermatologist if persist.   Procedures:  ECHO; pending.   Consultations:  Dr Johnnye Sima.   Discharge Exam: Filed Vitals:   10/06/13 0938  BP: 111/72  Pulse: 62  Temp:   Resp:     General: No distress.  Cardiovascular: S 1, S 2 RRR Respiratory: CTA  Discharge Instructions You were cared for by a hospitalist during your hospital stay. If you have any questions about your discharge medications or the care you received while you were in the hospital after you are discharged, you can call the unit and asked to speak with the hospitalist on call if the hospitalist that took care of you is not available. Once you are discharged, your primary care physician will handle any further medical issues. Please note that NO REFILLS for any discharge medications will be authorized once you are discharged, as it is imperative that you return to your primary care physician (or establish a relationship with a primary care physician if you do not have one) for your aftercare needs so that they can reassess your need for medications and monitor your lab values.  Discharge Orders   Future Appointments Provider Department Dept Phone   11/09/2013 1:45 PM Darlin Coco, MD [place] Office [phone]   12/09/2013 8:20 AM [facility] [place] [facility] [phone]   Future Orders Complete By Expires   Diet - low sodium heart healthy  As directed    Increase activity slowly  As directed        Medication List  STOP taking these medications       methocarbamol 500 MG tablet  Commonly known as:  ROBAXIN     nitroGLYCERIN 0.4 MG SL tablet  Commonly known as:  NITROSTAT      TAKE these medications       amoxicillin-clavulanate 875-125 MG per tablet  Commonly known as:  AUGMENTIN  Take 1 tablet by mouth 2 (two) times daily.     escitalopram 20 MG tablet  Commonly known as:  LEXAPRO  Take 20 mg by mouth  daily.     gabapentin 300 MG capsule  Commonly known as:  NEURONTIN  Take 1 capsule (300 mg total) by mouth 3 (three) times daily.     HYDROcodone-acetaminophen 5-325 MG per tablet  Commonly known as:  NORCO/VICODIN  Take 1 tablet by mouth every 4 (four) hours as needed for pain.     metoprolol succinate 25 MG 24 hr tablet  Commonly known as:  TOPROL-XL  Take 1 tablet (25 mg total) by mouth daily.     simvastatin 10 MG tablet  Commonly known as:  ZOCOR  Take 10 mg by mouth at bedtime.     tiotropium 18 MCG inhalation capsule  Commonly known as:  SPIRIVA  Place 18 mcg into inhaler and inhale daily as needed (for shortness of breath).     warfarin 5 MG tablet  Commonly known as:  COUMADIN  Take 5 mg by mouth daily.     zolpidem 10 MG tablet  Commonly known as:  AMBIEN  Take 10 mg by mouth at bedtime.       Allergies  Allergen Reactions  . Codeine Nausea And Vomiting  . Crestor [Rosuvastatin Calcium] Other (See Comments)    Leg cramps       Follow-up Information   Follow up with Ginette Otto, MD In 1 week.   Specialty:  Internal Medicine   Contact information:   [address] [place] [address] [phone]       Follow up with Johny Sax, MD In 2 weeks.   Specialty:  Infectious Diseases   Contact information:   [address]  [address] [place] [address] [phone]        The results of significant diagnostics from this hospitalization (including imaging, microbiology, ancillary and laboratory) are listed below for reference.    Significant Diagnostic Studies: Ct Lumbar Spine W Contrast  10/02/2013   CLINICAL DATA:  Right greater than left low back pain and diffuse bilateral leg pain and weakness.  EXAM: LUMBAR MYELOGRAM  FLUOROSCOPY TIME:  1 min 23 seconds  PROCEDURE: After thorough discussion of risks and benefits of the procedure including bleeding, infection, injury to nerves, blood vessels,  adjacent structures as well as headache and CSF leak, written and oral informed consent was obtained. Consent was obtained by Dr. Sebastian Ache. Time out form was completed.  Patient was positioned prone on the fluoroscopy table. Local anesthesia was provided with 1% lidocaine without epinephrine after prepped and draped in the usual sterile fashion. Puncture was performed at L4-5 using a 22-gauge spinal needle via a midline approach. Using a single pass through the dura, the needle was placed within the thecal sac, with return of clear CSF. 15 mL of Omnipaque-180 was injected into the thecal sac, with normal opacification of the nerve roots and cauda equina consistent with free flow within the subarachnoid space.  I personally performed the lumbar puncture and administered the  intrathecal contrast. I also personally supervised acquisition of the myelogram images.  TECHNIQUE: Contiguous axial images were obtained through the Lumbar spine after the intrathecal infusion of contrast. Coronal and sagittal reconstructions were obtained of the axial image sets.  COMPARISON:  Lumbar spine CT 11/15/2012  FINDINGS: LUMBAR MYELOGRAM FINDINGS:  Transitional lumbosacral anatomy is again noted. The numbering employed on the prior lumbar spine CT will be continued on this examination, with partial lumbarization of S1. Prior posterior fusion levels are again labeled L4-5. Mild L2 compression fracture status post cement augmentation is noted. There is slight retrolisthesis of L1 on L2, and there is slight anterolisthesis of L3 on L4 and L4 on L5. No significant change is identified with flexion or extension. Small ventral epidural defects are present from L1-2 to L4-5.  CT LUMBAR MYELOGRAM FINDINGS:  Transitional lumbosacral anatomy with numbering as above. Mild L2 compression fracture status post augmentation, unchanged. Prior L4-5 posterior fusion with bilateral transpedicular fixation screws in place. No lucency is identified  surrounding the screws to suggest loosening. There is trace retrolisthesis of L1 on L2, and there is trace anterolisthesis of L3 on L4 and L4 on L5. Infrarenal abdominal aorta measures up to 2.9 cm in transverse diameter, unchanged. 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  T12-L1:  Minimal disc bulge without stenosis.  L1-2: Listhesis, mild disc bulge, and mild endplate spurring result in mild bilateral lateral recess narrowing and mild left greater than right neural foraminal narrowing.  L2-3: Prior laminectomy. Mild disc bulge without spinal stenosis. Disc space narrowing and foraminal osteophytes result in mild-to-moderate bilateral neural foraminal stenosis, unchanged.  L3-4: Prior laminectomy without spinal stenosis. Mild disc bulge, endplate spurring, and facet arthrosis result in mild bilateral neural foraminal narrowing.  L4-5: Prior laminectomy without spinal stenosis. Mild disc bulge and endplate and facet spurring result in mild-to-moderate bilateral neural foraminal narrowing, greater on the right. This is similar to the prior study.  L5-S1: Circumferential disc bulge and osteophytic ridging and mild facet arthrosis result in mild-to-moderate left lateral recess stenosis and mild-to-moderate bilateral neural foraminal stenosis.  S1-2:  Rudimentary disc without stenosis.  IMPRESSION: 1. Prior L4-5 posterior decompression and fusion with decompressed spinal canal. 2. Multilevel degenerative disc disease and facet arthrosis resulting in mild to moderate bilateral neural foraminal narrowing as above.   Electronically Signed   By: Logan Bores   On: 10/02/2013 17:17   Dg Chest Port 1 View  10/02/2013   CLINICAL DATA:  Cough, fever  EXAM: PORTABLE CHEST - 1 VIEW  COMPARISON:  02/25/2013  FINDINGS: Mild patchy opacity in the right upper lobe, lingula, and left lower lobe, suspicious for pneumonia. No pleural effusion or  pneumothorax.  The heart is top-normal in size. Left subclavian pacemaker. Postsurgical changes related to prior CABG.  Cervical spine fixation hardware.  IMPRESSION: Suspected multifocal pneumonia in the right upper lobe, lingula, and left lower lobe.   Electronically Signed   By: Julian Hy M.D.   On: 10/02/2013 23:43   Dg Myelography Lumbar Inj Lumbosacral  10/02/2013   CLINICAL DATA:  Right greater than left low back pain and diffuse bilateral leg pain and weakness.  EXAM: LUMBAR MYELOGRAM  FLUOROSCOPY TIME:  1 min 23 seconds  PROCEDURE: After thorough discussion of risks and benefits of the procedure including bleeding, infection, injury to nerves, blood vessels, adjacent structures as well as headache and CSF leak, written  and oral informed consent was obtained. Consent was obtained by Dr. Logan Bores. Time out form was completed.  Patient was positioned prone on the fluoroscopy table. Local anesthesia was provided with 1% lidocaine without epinephrine after prepped and draped in the usual sterile fashion. Puncture was performed at L4-5 using a 22-gauge spinal needle via a midline approach. Using a single pass through the dura, the needle was placed within the thecal sac, with return of clear CSF. 15 mL of Omnipaque-180 was injected into the thecal sac, with normal opacification of the nerve roots and cauda equina consistent with free flow within the subarachnoid space.  I personally performed the lumbar puncture and administered the intrathecal contrast. I also personally supervised acquisition of the myelogram images.  TECHNIQUE: Contiguous axial images were obtained through the Lumbar spine after the intrathecal infusion of contrast. Coronal and sagittal reconstructions were obtained of the axial image sets.  COMPARISON:  Lumbar spine CT 11/15/2012  FINDINGS: LUMBAR MYELOGRAM FINDINGS:  Transitional lumbosacral anatomy is again noted. The numbering employed on the prior lumbar spine CT will be  continued on this examination, with partial lumbarization of S1. Prior posterior fusion levels are again labeled L4-5. Mild L2 compression fracture status post cement augmentation is noted. There is slight retrolisthesis of L1 on L2, and there is slight anterolisthesis of L3 on L4 and L4 on L5. No significant change is identified with flexion or extension. Small ventral epidural defects are present from L1-2 to L4-5.  CT LUMBAR MYELOGRAM FINDINGS:  Transitional lumbosacral anatomy with numbering as above. Mild L2 compression fracture status post augmentation, unchanged. Prior L4-5 posterior fusion with bilateral transpedicular fixation screws in place. No lucency is identified surrounding the screws to suggest loosening. There is trace retrolisthesis of L1 on L2, and there is trace anterolisthesis of L3 on L4 and L4 on L5. Infrarenal abdominal aorta measures up to 2.9 cm in transverse diameter, unchanged. 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  T12-L1:  Minimal disc bulge without stenosis.  L1-2: Listhesis, mild disc bulge, and mild endplate spurring result in mild bilateral lateral recess narrowing and mild left greater than right neural foraminal narrowing.  L2-3: Prior laminectomy. Mild disc bulge without spinal stenosis. Disc space narrowing and foraminal osteophytes result in mild-to-moderate bilateral neural foraminal stenosis, unchanged.  L3-4: Prior laminectomy without spinal stenosis. Mild disc bulge, endplate spurring, and facet arthrosis result in mild bilateral neural foraminal narrowing.  L4-5: Prior laminectomy without spinal stenosis. Mild disc bulge and endplate and facet spurring result in mild-to-moderate bilateral neural foraminal narrowing, greater on the right. This is similar to the prior study.  L5-S1: Circumferential disc bulge and osteophytic ridging and mild facet arthrosis result in mild-to-moderate left  lateral recess stenosis and mild-to-moderate bilateral neural foraminal stenosis.  S1-2:  Rudimentary disc without stenosis.  IMPRESSION: 1. Prior L4-5 posterior decompression and fusion with decompressed spinal canal. 2. Multilevel degenerative disc disease and facet arthrosis resulting in mild to moderate bilateral neural foraminal narrowing as above.   Electronically Signed   By: Logan Bores   On: 10/02/2013 17:17    Microbiology: Recent Results (from the past 240 hour(s))  CULTURE, BLOOD (ROUTINE X 2)     Status: None   Collection Time    10/02/13 12:10 AM      Result Value Ref Range Status   Specimen Description BLOOD RIGHT ARM   Final  Special Requests BOTTLES DRAWN AEROBIC AND ANAEROBIC 10CC EACH   Final   Culture  Setup Time     Final   Value: 10/03/2013 10:41     Performed at [facility]   Culture     Final   Value:        BLOOD CULTURE RECEIVED NO GROWTH TO DATE CULTURE WILL BE HELD FOR 5 DAYS BEFORE ISSUING A FINAL NEGATIVE REPORT     Performed at [facility]   Report Status PENDING   Incomplete  CULTURE, BLOOD (ROUTINE X 2)     Status: None   Collection Time    10/02/13 12:15 AM      Result Value Ref Range Status   Specimen Description BLOOD RIGHT HAND   Final   Special Requests BOTTLES DRAWN AEROBIC ONLY 10CC   Final   Culture  Setup Time     Final   Value: 10/03/2013 10:41     Performed at [facility]   Culture     Final   Value: VIRIDANS STREPTOCOCCUS     Note: CRITICAL RESULT CALLED TO, READ BACK BY AND VERIFIED WITH: TIM IRBY RN $Remov'@[email or web address]$  10/04/13 VINCJ     Performed at [facility]   Report Status 10/05/2013 FINAL   Final  URINE CULTURE     Status: None   Collection Time    10/03/13 12:15 AM      Result Value Ref Range Status   Specimen Description URINE, RANDOM   Final   Special Requests NONE   Final   Culture  Setup Time     Final   Value: 10/03/2013 10:42     Performed at [place]     Final    Value: >=100,000 COLONIES/ML     Performed at [facility]   Culture     Final   Value: Multiple bacterial morphotypes present, none predominant. Suggest appropriate recollection if clinically indicated.     Performed at [facility]   Report Status 10/05/2013 FINAL   Final  MRSA PCR SCREENING     Status: None   Collection Time    10/03/13  2:34 AM      Result Value Ref Range Status   MRSA by PCR NEGATIVE  NEGATIVE Final   Comment:            The GeneXpert MRSA Assay (FDA     approved for NASAL specimens     only), is one component of a     comprehensive MRSA colonization     surveillance program. It is not     intended to diagnose MRSA     infection nor to guide or     monitor treatment for     MRSA infections.  GRAM STAIN     Status: None   Collection Time    10/04/13 11:38 AM      Result Value Ref Range Status   Specimen Description URINE, RANDOM   Final   Special Requests NONE   Final   Gram Stain     Final   Value: WBC PRESENT, PREDOMINANTLY MONONUCLEAR     NO ORGANISMS SEEN     CYTOSPIN SLIDE   Report Status 10/04/2013 FINAL   Final  CULTURE, BLOOD (ROUTINE X 2)     Status: None   Collection Time    10/05/13  5:04 PM      Result Value Ref Range Status  Specimen Description BLOOD LEFT ANTECUBITAL   Final   Special Requests BOTTLES DRAWN AEROBIC AND ANAEROBIC 10CC   Final   Culture  Setup Time     Final   Value: 10/05/2013 20:09     Performed at [facility]   Culture     Final   Value:        BLOOD CULTURE RECEIVED NO GROWTH TO DATE CULTURE WILL BE HELD FOR 5 DAYS BEFORE ISSUING A FINAL NEGATIVE REPORT     Performed at [facility]   Report Status PENDING   Incomplete  CULTURE, BLOOD (ROUTINE X 2)     Status: None   Collection Time    10/05/13  5:10 PM      Result Value Ref Range Status   Specimen Description BLOOD LEFT HAND   Final   Special Requests BOTTLES DRAWN AEROBIC AND ANAEROBIC 10CC   Final   Culture  Setup Time      Final   Value: 10/05/2013 20:09     Performed at [facility]   Culture     Final   Value:        BLOOD CULTURE RECEIVED NO GROWTH TO DATE CULTURE WILL BE HELD FOR 5 DAYS BEFORE ISSUING A FINAL NEGATIVE REPORT     Performed at [facility]   Report Status PENDING   Incomplete     Labs: Basic Metabolic Panel:  Recent Labs Lab 10/02/13 2348 10/03/13 0201 10/03/13 0805 10/04/13 0300 10/05/13 0415  NA 138 135* 139 140 141  K 4.2 6.0* 4.6 4.1 3.9  CL 100 100 103 107 107  CO2 $Re'21 21 21 21 23  'MZI$ GLUCOSE 112* 149* 98 102* 88  BUN $Re'20 21 21 17 15  'ssE$ CREATININE 0.97 1.00 1.06 1.01 1.01  CALCIUM 9.4 8.1* 8.2* 8.3* 8.5   Liver Function Tests:  Recent Labs Lab 10/02/13 2348  AST 15  ALT 10  ALKPHOS 98  BILITOT 0.6  PROT 7.7  ALBUMIN 3.7   No results found for this basename: LIPASE, AMYLASE,  in the last 168 hours No results found for this basename: AMMONIA,  in the last 168 hours CBC:  Recent Labs Lab 10/02/13 2348 10/03/13 0201 10/04/13 0300 10/05/13 0415  WBC 27.6* 30.3* 13.1* 11.3*  NEUTROABS 23.7*  --   --   --   HGB 13.7 12.1* 11.0* 11.4*  HCT 40.7 36.2* 33.5* 34.5*  MCV 91.5 92.1 93.3 92.2  PLT 245 236 186 212   Cardiac Enzymes:  Recent Labs Lab 10/02/13 2349  TROPONINI <0.30   BNP: BNP (last 3 results) No results found for this basename: PROBNP,  in the last 8760 hours CBG: No results found for this basename: GLUCAP,  in the last 168 hours     Signed:  Donley Harland A Jennesis Ramaswamy  [facility] 10/06/2013, 1:06 PM

## 2013-10-07 NOTE — Consult Note (Signed)
Agree with above 

## 2013-10-08 NOTE — Plan of Care (Signed)
I was called today for [name] who called into our office that he was having some side effect from the antibiotic. I called the patient, Peter Richards who reported that he has been having diarrhea for last 4 days since 5/4. In the chart review, there was no mention of diarrhea, hence no stool samples checked for C. difficile. Patient reports that he had 4 episodes of watery bowel movement today. I strongly recommended him that he needs to have  Stool sample for C. Difficile asap as he is currently on antibiotics for Streptococcus viridans bacteremia. Either he can do that at his PCPs office, Dr. Felipa Eth or come to the ER for stool studies. Patient reports that they have a clinic in the [facility] facility where he is at and he will also call Dr. Felipa Eth now.   Ripudeep Krystal Eaton M.D. [facility] 10/08/2013, 3:43 PM  Pager: [phone]

## 2013-10-09 LAB — CULTURE, BLOOD (ROUTINE X 2): Culture: NO GROWTH

## 2013-10-11 LAB — CULTURE, BLOOD (ROUTINE X 2)
Culture: NO GROWTH
Culture: NO GROWTH

## 2013-10-13 ENCOUNTER — Other Ambulatory Visit: Payer: Self-pay | Admitting: Geriatric Medicine

## 2013-10-13 ENCOUNTER — Ambulatory Visit
Admission: RE | Admit: 2013-10-13 | Discharge: 2013-10-13 | Disposition: A | Discharge status: Home / Self Care | Payer: Commercial Managed Care - HMO | Source: Ambulatory Visit | Attending: Geriatric Medicine | Admitting: Geriatric Medicine

## 2013-10-13 DIAGNOSIS — J189 Pneumonia, unspecified organism: Secondary | ICD-10-CM

## 2013-10-27 ENCOUNTER — Inpatient Hospital Stay: Payer: Commercial Managed Care - HMO | Admitting: Internal Medicine

## 2013-10-28 ENCOUNTER — Ambulatory Visit (INDEPENDENT_AMBULATORY_CARE_PROVIDER_SITE_OTHER): Payer: Commercial Managed Care - HMO | Admitting: Cardiovascular Disease

## 2013-10-28 DIAGNOSIS — I4891 Unspecified atrial fibrillation: Secondary | ICD-10-CM

## 2013-10-28 LAB — POCT INR: INR: 2.3

## 2013-10-29 ENCOUNTER — Other Ambulatory Visit: Payer: Self-pay | Admitting: *Deleted

## 2013-10-29 DIAGNOSIS — M79609 Pain in unspecified limb: Secondary | ICD-10-CM

## 2013-10-30 ENCOUNTER — Other Ambulatory Visit: Payer: Self-pay | Admitting: *Deleted

## 2013-10-30 DIAGNOSIS — M79609 Pain in unspecified limb: Secondary | ICD-10-CM

## 2013-11-06 ENCOUNTER — Encounter: Payer: Self-pay | Admitting: Surgery

## 2013-11-09 ENCOUNTER — Ambulatory Visit (HOSPITAL_COMMUNITY)
Admission: RE | Admit: 2013-11-09 | Discharge: 2013-11-09 | Disposition: A | Discharge status: Home / Self Care | Payer: Medicare HMO | Source: Ambulatory Visit | Attending: Surgery | Admitting: Surgery

## 2013-11-09 ENCOUNTER — Encounter: Payer: Self-pay | Admitting: Cardiology

## 2013-11-09 ENCOUNTER — Ambulatory Visit (INDEPENDENT_AMBULATORY_CARE_PROVIDER_SITE_OTHER): Payer: Commercial Managed Care - HMO | Admitting: Cardiology

## 2013-11-09 ENCOUNTER — Ambulatory Visit (INDEPENDENT_AMBULATORY_CARE_PROVIDER_SITE_OTHER): Payer: Commercial Managed Care - HMO | Admitting: Surgery

## 2013-11-09 ENCOUNTER — Encounter: Payer: Self-pay | Admitting: Surgery

## 2013-11-09 VITALS — BP 104/61 | HR 67 | Ht 69.0 in | Wt 174.4 lb

## 2013-11-09 VITALS — BP 111/73 | HR 83 | Ht 69.0 in | Wt 176.0 lb

## 2013-11-09 DIAGNOSIS — G4700 Insomnia, unspecified: Secondary | ICD-10-CM

## 2013-11-09 DIAGNOSIS — M79609 Pain in unspecified limb: Secondary | ICD-10-CM | POA: Insufficient documentation

## 2013-11-09 DIAGNOSIS — I480 Paroxysmal atrial fibrillation: Secondary | ICD-10-CM

## 2013-11-09 DIAGNOSIS — I4891 Unspecified atrial fibrillation: Secondary | ICD-10-CM

## 2013-11-09 DIAGNOSIS — R0989 Other specified symptoms and signs involving the circulatory and respiratory systems: Secondary | ICD-10-CM

## 2013-11-09 DIAGNOSIS — I2510 Atherosclerotic heart disease of native coronary artery without angina pectoris: Secondary | ICD-10-CM

## 2013-11-09 DIAGNOSIS — R0609 Other forms of dyspnea: Secondary | ICD-10-CM

## 2013-11-09 MED ORDER — ZOLPIDEM TARTRATE 5 MG PO TABS: 5.0000 mg | ORAL_TABLET | Freq: Every evening | ORAL | Status: DC | PRN

## 2013-11-09 NOTE — Progress Notes (Signed)
Peter Richards Date of Birth:  [birth year] [place] [address] [place], [place]  [address] [phone]        Fax   [phone]   History of Present Illness:   This pleasant 80 year old gentleman is seen for a scheduled followup office visit.[name] He has a history of tachybradycardia syndrome and paroxysmal atrial fibrillation. He has a dual-chamber pacemaker. His pacemaker is followed by Dr. Lovena Le. He is not having any symptoms from his heart recently. He has a history of hypercholesterolemia. The patient is on long-term Coumadin anticoagulation because of his paroxysmal A. fib. He is maintaining normal sinus rhythm. The patient has been off amiodarone since last year because of question of pulmonary fibrosis. A recent chest x-ray showed no change in the amount of fibrosis from last year.  The patient had an echocardiogram 04/11/12 showed an ejection fraction of 55-65% with grade 1 diastolic dysfunction. His last nuclear stress test was an adenosine study on 04/21/08 which showed an ejection fraction of 61% and no ischemia.  Since we last saw him he was admitted by Dr. Lynann Bologna and underwent successful back surgery with insertion of rods. It has relieved his low back pain.  The patient has been having some pain in his legs.  He saw Dr. Trula Slade for a check of his circulation earlier today.  Dr. Trula Slade found that his leg circulation was fine. Since we last saw the patient he was hospitalized for pneumonia manifested as a cough fever and elevated white count.  Current Outpatient Prescriptions  Medication Sig Dispense Refill  . amoxicillin-clavulanate (AUGMENTIN) 875-125 MG per tablet Take 1 tablet by mouth every 12 (twelve) hours.  56 tablet  0  . escitalopram (LEXAPRO) 20 MG tablet Take 20 mg by mouth daily.      . fentaNYL (DURAGESIC - DOSED MCG/HR) 25 MCG/HR patch Place 1 patch (25 mcg total) onto the skin every 3 (three) days.  5 patch  0  . gabapentin  (NEURONTIN) 300 MG capsule Take 1 capsule (300 mg total) by mouth 3 (three) times daily.  90 capsule  1  . HYDROcodone-acetaminophen (NORCO/VICODIN) 5-325 MG per tablet Take 1 tablet by mouth every 4 (four) hours as needed for pain.      . metoprolol succinate (TOPROL-XL) 25 MG 24 hr tablet Take 1 tablet (25 mg total) by mouth daily.  90 tablet  3  . simvastatin (ZOCOR) 10 MG tablet Take 10 mg by mouth at bedtime.      [name] tiotropium (SPIRIVA) 18 MCG inhalation capsule Place 18 mcg into inhaler and inhale daily as needed (for shortness of breath).      [name] tiZANidine (ZANAFLEX) 4 MG tablet Take 4 mg by mouth 2 (two) times daily.      . traZODone (DESYREL) 50 MG tablet Take by mouth.      . warfarin (COUMADIN) 5 MG tablet Take 1 tablet (5 mg total) by mouth daily.  30 tablet  0  . zolpidem (AMBIEN) 10 MG tablet Take 5 mg by mouth at bedtime.        No current facility-administered medications for this visit.    Allergies  Allergen Reactions  . Codeine Nausea And Vomiting  . Crestor [Rosuvastatin Calcium] Other (See Comments)    Leg cramps    Patient Active Problem List   Diagnosis Date Noted  . Pain in limb 11/09/2013  . Community acquired pneumonia 10/03/2013  . Sepsis 10/03/2013  . Right carotid bruit  07/07/2013  . COPD (chronic obstructive pulmonary disease) 06/05/2012  . Interstitial lung disease 01/22/2012  . Lumbar compression fracture 08/24/2011  . Dyslipidemia 05/02/2011  . Low back pain 05/02/2011  . Hx of bladder cancer 01/01/2011  . A-fib 11/15/2010  . Atrial fibrillation 08/25/2010  . HYPERLIPIDEMIA 11/18/2009  . CORONARY ARTERY DISEASE 11/18/2009  . BRADYCARDIA 11/18/2009  . HYPOTENSION, ORTHOSTATIC 11/18/2009  . SYNCOPE 11/18/2009  . CHEST PAIN 11/18/2009  . ABDOMINAL AORTIC ANEURYSM, HX OF 11/18/2009  . ATRIAL FIBRILLATION, HX OF 11/18/2009  . PACEMAKER, PERMANENT 11/18/2009    History  Smoking status  . Former Smoker -- 3.00 packs/day for 50 years  . Types:  Cigarettes  . Quit date: 06/04/1978  Smokeless tobacco  . Current User  . Types: Snuff    History  Alcohol Use No    Comment: rare "once a month"    Family History  Problem Relation Age of Onset  . Cancer Father     stomach  . Cancer Sister     lung  . Heart disease Sister   . Hypertension Sister   . Heart disease Mother   . Hypertension Mother     Review of Systems: Constitutional: no fever chills diaphoresis or fatigue or change in weight.  Head and neck: no hearing loss, no epistaxis, no photophobia or visual disturbance. Respiratory: No cough, shortness of breath or wheezing. Cardiovascular: No chest pain peripheral edema, palpitations. Gastrointestinal: No abdominal distention, no abdominal pain, no change in bowel habits hematochezia or melena. Genitourinary: No dysuria, no frequency, no urgency, no nocturia. Musculoskeletal:No arthralgias, no back pain, no gait disturbance or myalgias. Neurological: No dizziness, no headaches, no numbness, no seizures, no syncope, no weakness, no tremors. Hematologic: No lymphadenopathy, no easy bruising. Psychiatric: No confusion, no hallucinations, no sleep disturbance.    Physical Exam: Filed Vitals:   11/09/13 1350  BP: 111/73  Pulse: 83   the general appearance reveals a well-developed well-nourished elderly gentleman in no distress.The head and neck exam reveals pupils equal and reactive.  Extraocular movements are full.  There is no scleral icterus.  The mouth and pharynx are normal.  The neck is supple.  The carotids reveal soft right carotid bruit. The jugular venous pressure is normal.  The  thyroid is not enlarged.  There is no lymphadenopathy.  The chest is clear to percussion and auscultation.  There are no rales or rhonchi.  Expansion of the chest is symmetrical.  The precordium is quiet.  The first heart sound is normal.  The second heart sound is physiologically split.  There is soft systolic ejection murmur at the  base.  There is no abnormal lift or heave.  The abdomen is soft and nontender.  The bowel sounds are normal.  The liver and spleen are not enlarged.  There are no abdominal masses.  Patient has a urostomy pouch in the right lower quadrant. There are no abdominal bruits.  Extremities reveal good pedal pulses.  There is no phlebitis or edema.  There is no cyanosis or clubbing.  Strength is normal and symmetrical in all extremities.  There is no lateralizing weakness.  There are no sensory deficits.  The skin is warm and dry.  There is no rash.     Assessment / Plan: 1.  Paroxysmal atrial fibrillation 2. ischemic heart disease status post remote CABG (old records suggest 2002 and also mention 1999) 3. status post abdominal aortic aneurysm repair 1999 4. permanent pacemaker implantation in June 2011 for  syncope by Dr. Olevia Perches. 5. status post cholecystectomy 6. Hypercholesterolemia 7. Insomnia 8. status post left carotid endarterectomy 9. mild aortic stenosis 10.  Status post bladder cancer with subsequent urostomy  Disposition: Continue on same medication.  We are reducing his generic Ambien to 5 mg at bedtime when necessary at his request. Recheck in 4 months for office visit and EKG

## 2013-11-09 NOTE — Progress Notes (Signed)
Patient name: Peter Richards MRN: [id number] DOB: 07/23/1928 Sex: male   Referred by: Dr. Felipa Eth  Reason for referral:  Chief Complaint  Patient presents with  . New Evaluation    c/o flank pain and bilateral leg pain    HISTORY OF PRESENT ILLNESS: The patient is well known to me.  I performed left carotid endarterectomy in November of 2008.  This was done following a mild stroke.  The last time the patient followed up for surveillance carotid ultrasound imaging was in 2010.  He has been without complaints from a neurologic perspective.  He recently had a carotid ultrasound at Dr. Bobbye Riggs office showed minimal stenosis bilaterally.  The patient comes in today for evaluation of leg pain.  He states that he underwent back surgery about a year ago for back pain which radiated down his legs.  Since that time he reports having constant pain, mostly behind his knee and into his calves bilaterally.  He states that the only thing that helps is wearing a pain patch.  He tells me that the pain is not made worse by walking.  The patient is on anticoagulation for paroxysmal atrial fibrillation.    He has a dual-chamber pacemaker.  He is on a statin for hyperlipidemia.   Past Medical History  Diagnosis Date  . Hyperlipidemia   . Orthostatic hypotension   . Bradycardia   . Syncope     s/p PTVP  . Myocardial infarction     1981  . CAD (coronary artery disease)     prior CABG in 2002 negative Myoview in 2009  . Hypertension   . Stroke     TIA  2007    . GERD (gastroesophageal reflux disease)   . Headache(784.0)   . Arthritis     OSTEOPOROSIS  . Aortic stenosis, mild     by 01/08/08 echo  . Chronic anticoagulation   . Depression   . AAA (abdominal aortic aneurysm)   . Heart attack   . Pacemaker     medtronic pacer  . History of cervical spinal surgery     over [place]  [name] HOH (hard of hearing)     BILATERAL HEARING AIDS  . Prostate cancer   . Basal cell  adenocarcinoma     'bottom lip" (11/06/2012)  . Bladder cancer   . Attention to urostomy     "since 2006" (11/06/2012)  . Atrial fibrillation     on amiodarone and has PTVP in place  . Exertional shortness of breath   . Chronic radicular pain of lower back   . Anxiety   . COPD (chronic obstructive pulmonary disease)     Past Surgical History  Procedure Laterality Date  . Insert / replace / remove pacemaker      11/2009  DR BRODIE    . Prostatectomy      BLADDER ALSO REMOVED (OSTOMY BAG)  2006  FOR CANCER     . Abdominal aortic aneurysm repair  1999    STENT PLACED    . Cataract extraction w/ intraocular lens  implant, bilateral    . Appendectomy      1959  . Revision urostomy cutaneous    . Revision urostomy cutaneous  2006    Urostomy bag placed on right lower abdomen  . Vertebroplasty  08/22/2011    Procedure: VERTEBROPLASTY;  Surgeon: Winfield Cunas, MD;  Location: [facility];  Service: Neurosurgery;  Laterality: N/A;  Lumbar Two  vertebroplasty  . Cholecystectomy      4 YRS AGO  . Posterior lumbar fusion  11/06/2012  . Coronary artery bypass graft  1999    CABG X2  . Skin cancer excision      "bottom lip" (11/06/2012)  . Bladder surgery      "for cancer" (11/06/2012)  . Lumbar laminectomy/decompression microdiscectomy Bilateral 11/06/2012    Procedure: LUMBAR LAMINECTOMY/DECOMPRESSION MICRODISCECTOMY;  Surgeon: Sinclair Ship, MD;  Location: [facility];  Service: Orthopedics;  Laterality: Bilateral;  Lumbar 2-5 decompression    History   Social History  . Marital Status: Married    Spouse Name: N/A    Number of Children: 3  . Years of Education: N/A   Occupational History  . Retired    Social History Main Topics  . Smoking status: Former Smoker -- 3.00 packs/day for 50 years    Types: Cigarettes    Quit date: 06/04/1978  . Smokeless tobacco: Current User    Types: Snuff  . Alcohol Use: No     Comment: rare "once a month"  . Drug Use: No  . Sexual Activity: No    Other Topics Concern  . Not on file   Social History Narrative  . No narrative on file    Family History  Problem Relation Age of Onset  . Cancer Father     stomach  . Cancer Sister     lung  . Heart disease Sister   . Hypertension Sister   . Heart disease Mother   . Hypertension Mother     Allergies as of 11/09/2013 - Review Complete 11/09/2013  Allergen Reaction Noted  . Codeine Nausea And Vomiting 12/19/2009  . Crestor [rosuvastatin calcium] Other (See Comments) 01/01/2011    Current Outpatient Prescriptions on File Prior to Visit  Medication Sig Dispense Refill  . escitalopram (LEXAPRO) 20 MG tablet Take 20 mg by mouth daily.      . fentaNYL (DURAGESIC - DOSED MCG/HR) 25 MCG/HR patch Place 1 patch (25 mcg total) onto the skin every 3 (three) days.  5 patch  0  . HYDROcodone-acetaminophen (NORCO/VICODIN) 5-325 MG per tablet Take 1 tablet by mouth every 4 (four) hours as needed for pain.      . metoprolol succinate (TOPROL-XL) 25 MG 24 hr tablet Take 1 tablet (25 mg total) by mouth daily.  90 tablet  3  . simvastatin (ZOCOR) 10 MG tablet Take 10 mg by mouth at bedtime.      [name] warfarin (COUMADIN) 5 MG tablet Take 1 tablet (5 mg total) by mouth daily.  30 tablet  0  . zolpidem (AMBIEN) 10 MG tablet Take 10 mg by mouth at bedtime.       [name] amoxicillin-clavulanate (AUGMENTIN) 875-125 MG per tablet Take 1 tablet by mouth every 12 (twelve) hours.  56 tablet  0  . gabapentin (NEURONTIN) 300 MG capsule Take 1 capsule (300 mg total) by mouth 3 (three) times daily.  90 capsule  1  . tiotropium (SPIRIVA) 18 MCG inhalation capsule Place 18 mcg into inhaler and inhale daily as needed (for shortness of breath).       No current facility-administered medications on file prior to visit.     REVIEW OF SYSTEMS: Cardiovascular: No chest pain, chest pressure, palpitations, orthopnea, or dyspnea on exertion.  Positive for leg pain No history of DVT or phlebitis. Pulmonary: No productive  cough, asthma or wheezing. Neurologic: And numbness in his legs Hematologic: No bleeding problems or clotting  disorders. Musculoskeletal: No joint pain or joint swelling. Gastrointestinal: No blood in stool or hematemesis Genitourinary: No dysuria or hematuria. Psychiatric:: No history of major depression. Integumentary: No rashes or ulcers. Constitutional: No fever or chills.  PHYSICAL EXAMINATION: General: The patient appears their stated age.  Vital signs are BP 104/61  Pulse 67  Ht 5\' 9"  (1.753 m)  Wt 174 lb 6.4 oz (79.107 kg)  BMI 25.74 kg/m2  SpO2 94% HEENT:  No gross abnormalities Pulmonary: Respirations are non-labored Abdomen: Soft and non-tender  Musculoskeletal: There are no major deformities.   Neurologic: No focal weakness or paresthesias are detected, Skin: There are no ulcer or rashes noted. Psychiatric: The patient has normal affect. Cardiovascular: There is a regular rate and rhythm without significant murmur appreciated.  Bilateral carotid bruits.  Palpable pedal pulses bilaterally  Diagnostic Studies: I have ordered and reviewed his vascular lab studies.  He has an ankle-brachial index of 0.97 on the right and 1.13 on the left.  Both with triphasic waveforms   Assessment:  #1: Bilateral leg pain #2: carotid occlusive disease Plan: #1: Based on palpable pedal pulses on physical exam and ultrasound study that shows triphasic waveforms an ankle-brachial index of 1.0 bilaterally, I reassured the patient that I do not feel that his leg pain symptoms are related to peripheral vascular disease.  I have encouraged him to be reevaluated by Dr. Lynann Bologna. #2: The patient recently had a carotid Doppler study by Dr.Brackbill's.  I am unsure how he fell through the cracks with surveillance.  I feel that he needs yearly surveillance.  I told them that he could arrange this with his cardiologist or I would be happy to followup in one year.   Eldridge Abrahams,  M.D. [facility] Office: [phone] Pager:  [phone]

## 2013-11-09 NOTE — Assessment & Plan Note (Signed)
The patient has not been having any recurrent chest pain or angina pectoris 

## 2013-11-09 NOTE — Assessment & Plan Note (Signed)
The patient has not been having any recurrence of his atrial fibrillation.  He has not been having any problems from his warfarin therapy.  He denies any hematochezia or melena.  He has had no TIA symptoms.

## 2013-11-09 NOTE — Patient Instructions (Signed)
DECREASE YOUR AMBIEN TO 5 MG AT BEDTIME. A NEW RX HAS BEEN CALLED TO [facility]  Your physician wants you to follow-up in: 4 month ov/ekgYou will receive a reminder letter in the mail two months in advance. If you don't receive a letter, please call our office to schedule the follow-up appointment

## 2013-11-27 ENCOUNTER — Other Ambulatory Visit: Payer: Self-pay | Admitting: Internal Medicine

## 2013-11-27 ENCOUNTER — Other Ambulatory Visit: Payer: Self-pay | Admitting: Geriatric Medicine

## 2013-11-27 ENCOUNTER — Ambulatory Visit
Admission: RE | Admit: 2013-11-27 | Discharge: 2013-11-27 | Disposition: A | Discharge status: Home / Self Care | Payer: Commercial Managed Care - HMO | Source: Ambulatory Visit | Attending: Internal Medicine | Admitting: Internal Medicine

## 2013-11-27 DIAGNOSIS — R1084 Generalized abdominal pain: Secondary | ICD-10-CM

## 2013-11-27 DIAGNOSIS — R109 Unspecified abdominal pain: Secondary | ICD-10-CM

## 2013-11-27 DIAGNOSIS — R748 Abnormal levels of other serum enzymes: Secondary | ICD-10-CM

## 2013-12-02 ENCOUNTER — Other Ambulatory Visit: Payer: Self-pay | Admitting: Geriatric Medicine

## 2013-12-02 ENCOUNTER — Ambulatory Visit
Admission: RE | Admit: 2013-12-02 | Discharge: 2013-12-02 | Disposition: A | Discharge status: Home / Self Care | Payer: Commercial Managed Care - HMO | Source: Ambulatory Visit | Attending: Geriatric Medicine | Admitting: Geriatric Medicine

## 2013-12-02 DIAGNOSIS — R1310 Dysphagia, unspecified: Secondary | ICD-10-CM

## 2013-12-03 ENCOUNTER — Other Ambulatory Visit: Payer: Commercial Managed Care - HMO

## 2013-12-07 ENCOUNTER — Other Ambulatory Visit: Payer: Commercial Managed Care - HMO

## 2013-12-08 LAB — POCT INR: INR: 2.4

## 2013-12-09 ENCOUNTER — Encounter: Payer: Self-pay | Admitting: Internal Medicine

## 2013-12-09 ENCOUNTER — Ambulatory Visit (INDEPENDENT_AMBULATORY_CARE_PROVIDER_SITE_OTHER): Payer: Commercial Managed Care - HMO | Admitting: Cardiology

## 2013-12-09 ENCOUNTER — Ambulatory Visit (INDEPENDENT_AMBULATORY_CARE_PROVIDER_SITE_OTHER): Payer: Commercial Managed Care - HMO | Admitting: *Deleted

## 2013-12-09 DIAGNOSIS — I498 Other specified cardiac arrhythmias: Secondary | ICD-10-CM

## 2013-12-09 NOTE — Progress Notes (Signed)
Remote pacemaker transmission.   

## 2013-12-11 ENCOUNTER — Encounter: Payer: Self-pay | Admitting: Emergency Medicine

## 2013-12-11 ENCOUNTER — Ambulatory Visit (INDEPENDENT_AMBULATORY_CARE_PROVIDER_SITE_OTHER): Payer: Commercial Managed Care - HMO | Admitting: Emergency Medicine

## 2013-12-11 VITALS — BP 108/60 | HR 85 | Ht 68.0 in | Wt 173.0 lb

## 2013-12-11 DIAGNOSIS — J4489 Other specified chronic obstructive pulmonary disease: Secondary | ICD-10-CM

## 2013-12-11 DIAGNOSIS — J449 Chronic obstructive pulmonary disease, unspecified: Secondary | ICD-10-CM

## 2013-12-11 LAB — MDC_IDC_ENUM_SESS_TYPE_REMOTE
Brady Statistic AP VP Percent: 2 %
Brady Statistic AS VP Percent: 0 %
Brady Statistic AS VS Percent: 10 %
Date Time Interrogation Session: 20150708130558
Lead Channel Impedance Value: 939 Ohm
Lead Channel Pacing Threshold Amplitude: 0.625 V
Lead Channel Pacing Threshold Pulse Width: 0.4 ms
Lead Channel Setting Pacing Amplitude: 5 V
Lead Channel Setting Pacing Pulse Width: 1 ms
Lead Channel Setting Sensing Sensitivity: 2.8 mV
MDC IDC MSMT BATTERY IMPEDANCE: 155 Ohm
MDC IDC MSMT BATTERY REMAINING LONGEVITY: 121 mo
MDC IDC MSMT BATTERY VOLTAGE: 2.79 V
MDC IDC MSMT LEADCHNL RA IMPEDANCE VALUE: 498 Ohm
MDC IDC MSMT LEADCHNL RV SENSING INTR AMPL: 5.6 mV
MDC IDC SET LEADCHNL RA PACING AMPLITUDE: 2 V
MDC IDC STAT BRADY AP VS PERCENT: 88 %

## 2013-12-11 MED ORDER — ALBUTEROL SULFATE HFA 108 (90 BASE) MCG/ACT IN AERS: 2.0000 | INHALATION_SPRAY | Freq: Four times a day (QID) | RESPIRATORY_TRACT | Status: DC | PRN

## 2013-12-11 MED ORDER — ESOMEPRAZOLE MAGNESIUM 40 MG PO PACK: 40.0000 mg | PACK | Freq: Every day | ORAL | Status: DC

## 2013-12-11 NOTE — Patient Instructions (Signed)
Stop Spiriva Start using albuterol 2 puffs as needed for shortness of breath We will repeat your CT scan of the chest Start Nexium 40 mg once a day until our next visit Follow with Dr Lamonte Sakai next available after CT scan to review

## 2013-12-11 NOTE — Assessment & Plan Note (Signed)
He is no longer taking Spiriva on schedule. It is not clear to me whether his new dyspnea as being driven more by COPD or his interstitial disease. I think for now we should stop the Spiriva, have him use albuterol when necessary and perform a CT scan of the chest to assess for progression of his ILD. If his ILD is stable then we will go back to scheduled Spiriva

## 2013-12-11 NOTE — Assessment & Plan Note (Signed)
Will repeat his CT scan of the chest to assess for progression of his interstitial disease

## 2013-12-11 NOTE — Progress Notes (Signed)
Subjective:    Patient ID: Peter Richards, male    DOB: 01-Dec-1928, 80 y.o.   MRN: [id number]  HPI 80 yo man, former smoker (150 pk-yrs), hx CAD/CABG ('02), A Fib on amiodarone for the last 4 years, was stopped 1 month ago, followed by Dr Mare Ferrari. He has been experiencing progressive exertional SOB for over 3 months such that he had to stop to rest with walking about 75 feet. He has also had some exertional CP that has responded to NTG, but the episodes of dyspnea haven't always occurred at same time as the CP. He has a cough, bothering him for about a year. He hears wheezing at times, seems to come at random, not necessarily with exertion.  Hurts sometimes to take a deep breath. Denies any sx or RA, SLE, etc.   ROV 06/10/12 -- follows up for dyspnea, presumed to be multifactorial. He has ILD on CT scan chest as well as obstruction on PFT. We started Spiriva last time to see if he would benefit >> he believes that he is less SOB. He hasn't exerted significantly, but his usual daily activities are easier. His wheeze is better. Last time he desaturated with ambulation, but wanted to defer. He has intermittent aspiration sx >> swallow precautions recommended 04/2011: 1. Consider esophageal assessment 2. Regular textures & Thin liquids 3. Compensations: Hard cough after swallow 4. Upright 30-60 min after meal;Seated upright 90 degrees  ROV 12/11/13 -- follow up visit for mixed disease > COPD + ILD on CT scan, suspected related to aspiration and silica exposure. He has been experiencing more dyspnea over the last 2-3 months. He has a new persistent cough associated with mid-chest discomfort. This is mid-sternal, intermittent. Has been associated with exertion, with his cough. No change with meals. He sees Dr Mare Ferrari for A fib / pacer, but has not talked to him about the CP. Pt has only taking Spiriva prn, about every week.     Data:  PFT were performed  04/09/12>> show mixed disease, no BD response,  restricted volumes, decreased DLCO that corrects for Va  TTE 04/11/12:  Study Conclusions - Left ventricle: The cavity size was normal. Wall thickness was increased in a pattern of moderate LVH. Systolic function was normal. The estimated ejection fraction was in the range of 55% to 65%. Wall motion was normal; there were no regional wall motion abnormalities. Doppler parameters are consistent with abnormal left ventricular relaxation (grade 1 diastolic dysfunction). - Aortic valve: Trivial regurgitation. - Mitral valve: Calcified annulus. - Left atrium: The atrium was mildly dilated. - Atrial septum: No defect or patent foramen ovale was identified.  CT scan chest 04/11/12 Comparison: Most recent prior CT scan of the chest 12/16/2009,  prior chest x-ray 08/20/2011  Findings:  Mediastinum: Unremarkable thyroid gland and thoracic inlet. Very  similar appearance of borderline enlarged mediastinal and hilar  lymph nodes compared to the prior CT scan. An index  aorticopulmonary window node again measures 10 mm in short axis.  Several low right paratracheal nodes demonstrate normal fatty hila.  The thoracic esophagus is unremarkable.  Heart/Vascular: Left subclavian approach dual lead cardiac rhythm  maintenance device. Leads project over the right atrium and right  ventricular apex. The heart is within normal limits for size. No  pericardial effusion. Conventional three-vessel arch anatomy.  Scattered atherosclerotic vascular calcification without  significant stenosis or aneurysmal dilatation. The patient is  status post median sternotomy with evidence of LIMA to LAD bypass  and descending aorta  to right coronary artery bypass. Both bypass  vessels opacify with contrast material consistent with patency.  There is atherosclerotic calcification of the coronary arteries.  Lungs/Pleura: Overall, similar pattern of diffuse peripheral  subpleural reticulation with mild honeycombing in the  bases and  areas of mild traction bronchiectasis. The findings appear stable  to incrementally progressed compared to 12/16/2009. Mild  superimposed paraseptal emphysema noted in the apices. Stable 2 mm  nodular opacity in the right middle lobe is likely benign. No new  suspicious pulmonary nodule identified.  Upper Abdomen: Surgical changes of prior cholecystectomy.  Otherwise, the visualized upper abdomen is unremarkable.  Bones: No acute fracture or aggressive appearing lytic or blastic  osseous lesion.  IMPRESSION:  1. No acute cardiopulmonary or thoracic process.  2. Stable to incremental progression of the findings of mild -  moderate interstitial pulmonary fibrosis (pattern most consistent  with UIP) compared to 12/16/2009.        Objective:   Physical Exam  Filed Vitals:   12/11/13 1104  BP: 108/60  Pulse: 85  Height: 5\' 8"  (1.727 m)  Weight: 173 lb (78.472 kg)  SpO2: 95%   Gen: Pleasant, well-nourished, in no distress,  normal affect  ENT: No lesions,  mouth clear,  oropharynx clear, no postnasal drip  Neck: No JVD, no TMG, no carotid bruits  Lungs: No use of accessory muscles, distant, B insp crackles  Cardiovascular: RRR, heart sounds normal, no murmur or gallops, no peripheral edema  Musculoskeletal: No deformities, no cyanosis or clubbing  Neuro: alert, non focal  Skin: Warm, no lesions or rashes      Assessment & Plan:  COPD (chronic obstructive pulmonary disease) He is no longer taking Spiriva on schedule. It is not clear to me whether his new dyspnea as being driven more by COPD or his interstitial disease. I think for now we should stop the Spiriva, have him use albuterol when necessary and perform a CT scan of the chest to assess for progression of his ILD. If his ILD is stable then we will go back to scheduled Spiriva  Interstitial lung disease Will repeat his CT scan of the chest to assess for progression of his interstitial disease

## 2013-12-22 ENCOUNTER — Ambulatory Visit (INDEPENDENT_AMBULATORY_CARE_PROVIDER_SITE_OTHER)
Admission: RE | Admit: 2013-12-22 | Discharge: 2013-12-22 | Disposition: A | Discharge status: Home / Self Care | Payer: Commercial Managed Care - HMO | Source: Ambulatory Visit | Attending: Emergency Medicine | Admitting: Emergency Medicine

## 2013-12-22 DIAGNOSIS — J449 Chronic obstructive pulmonary disease, unspecified: Secondary | ICD-10-CM

## 2013-12-22 DIAGNOSIS — J4489 Other specified chronic obstructive pulmonary disease: Secondary | ICD-10-CM

## 2013-12-23 ENCOUNTER — Encounter: Payer: Self-pay | Admitting: Cardiology

## 2014-01-04 ENCOUNTER — Ambulatory Visit (INDEPENDENT_AMBULATORY_CARE_PROVIDER_SITE_OTHER): Payer: Commercial Managed Care - HMO | Admitting: Adult Health

## 2014-01-04 ENCOUNTER — Encounter: Payer: Self-pay | Admitting: Adult Health

## 2014-01-04 VITALS — BP 110/72 | HR 71 | Temp 97.5°F | Ht 68.0 in | Wt 173.4 lb

## 2014-01-04 DIAGNOSIS — J449 Chronic obstructive pulmonary disease, unspecified: Secondary | ICD-10-CM

## 2014-01-04 DIAGNOSIS — J8410 Pulmonary fibrosis, unspecified: Secondary | ICD-10-CM

## 2014-01-04 DIAGNOSIS — J849 Interstitial pulmonary disease, unspecified: Secondary | ICD-10-CM

## 2014-01-04 NOTE — Patient Instructions (Signed)
Begin Oxygen 2l/m with activity and At bedtime   Follow up Dr. Lamonte Sakai  In 4 weeks with PFT  Please contact office for sooner follow up if symptoms do not improve or worsen or seek emergency care

## 2014-01-04 NOTE — Progress Notes (Signed)
Subjective:    Patient ID: Peter Richards, male    DOB: 12/14/1928, 80 y.o.   MRN: [id number]  HPI 80 yo man, former smoker (150 pk-yrs), hx CAD/CABG ('02), A Fib on amiodarone for the last 4 years, was stopped 1 month ago, followed by Dr Mare Ferrari. He has been experiencing progressive exertional SOB for over 3 months such that he had to stop to rest with walking about 75 feet. He has also had some exertional CP that has responded to NTG, but the episodes of dyspnea haven't always occurred at same time as the CP. He has a cough, bothering him for about a year. He hears wheezing at times, seems to come at random, not necessarily with exertion.  Hurts sometimes to take a deep breath. Denies any sx or RA, SLE, etc.   ROV 06/10/12 -- follows up for dyspnea, presumed to be multifactorial. He has ILD on CT scan chest as well as obstruction on PFT. We started Spiriva last time to see if he would benefit >> he believes that he is less SOB. He hasn't exerted significantly, but his usual daily activities are easier. His wheeze is better. Last time he desaturated with ambulation, but wanted to defer. He has intermittent aspiration sx >> swallow precautions recommended 04/2011: 1. Consider esophageal assessment 2. Regular textures & Thin liquids 3. Compensations: Hard cough after swallow 4. Upright 30-60 min after meal;Seated upright 90 degrees  ROV 12/11/13 -- follow up visit for mixed disease > COPD + ILD on CT scan, suspected related to aspiration and silica exposure. He has been experiencing more dyspnea over the last 2-3 months. He has a new persistent cough associated with mid-chest discomfort. This is mid-sternal, intermittent. Has been associated with exertion, with his cough. No change with meals. He sees Dr Mare Ferrari for A fib / pacer, but has not talked to him about the CP. Pt has only taking Spiriva prn, about every week.  >stop Spiriva  01/04/2014 Follow up  Pt was seen last month for follow up for  COPD and ILD(suspected aspiration and silica exposure)  Repeat CT chest done on 7/22 showed progressed ILD changes .  We reveiwed these results in detail and questions were answered .  Previous PFT showed mixed disease in 2013.  Says he gets winded with walking and has to stop and rest.  Today in office ambulatory walk showed drop in sats 85% on RA. At rest 92% on RA.  We discussed his option and he agrees to begin O2 with activity and. At bedtime .  He remains on Spiriva daily .  No hemoptysis ,orthopnea, exertional chest pain , edema or fever. No flare of cough.     ROS:  Constitutional:   No  weight loss, night sweats,  Fevers, chills,  +fatigue, or  lassitude.  HEENT:   No headaches,  Difficulty swallowing,  Tooth/dental problems, or  Sore throat,                No sneezing, itching, ear ache, nasal congestion, post nasal drip,   CV:  No chest pain,  Orthopnea, PND, swelling in lower extremities, anasarca, dizziness, palpitations, syncope.   GI  No heartburn, indigestion, abdominal pain, nausea, vomiting, diarrhea, change in bowel habits, loss of appetite, bloody stools.   Resp:    No chest wall deformity  Skin: no rash or lesions.  GU: no dysuria, change in color of urine, no urgency or frequency.  No flank pain, no hematuria  MS:  No joint pain or swelling.  No decreased range of motion.  No back pain.  Psych:  No change in mood or affect. No depression or anxiety.  No memory loss.           Objective:   Physical Exam  Gen: Pleasant, well-nourished, in no distress,  normal affect  ENT: No lesions,  mouth clear,  oropharynx clear, no postnasal drip  Neck: No JVD, no TMG, no carotid bruits  Lungs: No use of accessory muscles, distant, B insp crackles  Cardiovascular: RRR, heart sounds normal, no murmur or gallops, no peripheral edema  Musculoskeletal: No deformities, no cyanosis or clubbing  Neuro: alert, non focal  Skin: Warm, no lesions or  rashes    Data:  PFT were performed  04/09/12>> show mixed disease, no BD response, restricted volumes, decreased DLCO that corrects for [facility]  TTE 04/11/12:  Study Conclusions - Left ventricle: The cavity size was normal. Wall thickness was increased in a pattern of moderate LVH. Systolic function was normal. The estimated ejection fraction was in the range of 55% to 65%. Wall motion was normal; there were no regional wall motion abnormalities. Doppler parameters are consistent with abnormal left ventricular relaxation (grade 1 diastolic dysfunction). - Aortic valve: Trivial regurgitation. - Mitral valve: Calcified annulus. - Left atrium: The atrium was mildly dilated. - Atrial septum: No defect or patent foramen ovale was identified.  CT scan chest 04/11/12 Comparison: Most recent prior CT scan of the chest 12/16/2009,  prior chest x-ray 08/20/2011  Findings:  Mediastinum: Unremarkable thyroid gland and thoracic inlet. Very  similar appearance of borderline enlarged mediastinal and hilar  lymph nodes compared to the prior CT scan. An index  aorticopulmonary window node again measures 10 mm in short axis.  Several low right paratracheal nodes demonstrate normal fatty hila.  The thoracic esophagus is unremarkable.  Heart/Vascular: Left subclavian approach dual lead cardiac rhythm  maintenance device. Leads project over the right atrium and right  ventricular apex. The heart is within normal limits for size. No  pericardial effusion. Conventional three-vessel arch anatomy.  Scattered atherosclerotic vascular calcification without  significant stenosis or aneurysmal dilatation. The patient is  status post median sternotomy with evidence of LIMA to LAD bypass  and descending aorta to right coronary artery bypass. Both bypass  vessels opacify with contrast material consistent with patency.  There is atherosclerotic calcification of the coronary arteries.  Lungs/Pleura: Overall,  similar pattern of diffuse peripheral  subpleural reticulation with mild honeycombing in the bases and  areas of mild traction bronchiectasis. The findings appear stable  to incrementally progressed compared to 12/16/2009. Mild  superimposed paraseptal emphysema noted in the apices. Stable 2 mm  nodular opacity in the right middle lobe is likely benign. No new  suspicious pulmonary nodule identified.  Upper Abdomen: Surgical changes of prior cholecystectomy.  Otherwise, the visualized upper abdomen is unremarkable.  Bones: No acute fracture or aggressive appearing lytic or blastic  osseous lesion.  IMPRESSION:  1. No acute cardiopulmonary or thoracic process.  2. Stable to incremental progression of the findings of mild -  moderate interstitial pulmonary fibrosis (pattern most consistent  with UIP) compared to 12/16/2009.   CT chest 12/23/13 >Mediastinal lymph nodes measure up to 1 mm in AP window. There is a  larger lymph node in the lower right paratracheal station which is  predominantly made up of a fatty hilum. Hilar regions are difficult  to definitively evaluate without IV contrast. No axillary  adenopathy. Atherosclerotic calcification of the arterial  vasculature. Heart is at the upper limits of normal in size. No  pericardial effusion.  There is a peripheral and basilar predominant pattern of subpleural  reticulation, traction bronchiectasis/bronchiolectasis and mild  associated architectural distortion. There may be honeycombing in  the lower lobes. Findings have progressed in the lung bases when  compared with 04/11/2012. No air trapping. No pleural fluid. Debris  is seen dependently in the airway.  Incidental imaging of the upper abdomen shows the visualized  portions of the liver, adrenal glands, kidneys, spleen, pancreas and  stomach to be grossly unremarkable. No upper abdominal adenopathy.  No worrisome lytic or sclerotic lesions. Degenerative changes are  seen in  the spine.  IMPRESSION:  Peripheral and basilar predominant pattern of subpleural  reticulation, traction bronchiectasis/ bronchiolectasis and  associated architectural distortion. Findings have progressed from  04/11/2012 and indicative of usual interstitial pneumonitis (UIP).      Assessment & Plan:

## 2014-01-06 ENCOUNTER — Telehealth: Payer: Self-pay | Admitting: Emergency Medicine

## 2014-01-06 DIAGNOSIS — J849 Interstitial pulmonary disease, unspecified: Secondary | ICD-10-CM

## 2014-01-06 DIAGNOSIS — J438 Other emphysema: Secondary | ICD-10-CM

## 2014-01-06 NOTE — Telephone Encounter (Signed)
Called spoke w/ pt. He is requesting an order for small portable tanks. Please advise RB thanks

## 2014-01-06 NOTE — Telephone Encounter (Signed)
Order placed and nothing further needed

## 2014-01-06 NOTE — Telephone Encounter (Signed)
Please place DME order for the smallest most portable system available

## 2014-01-11 ENCOUNTER — Telehealth: Payer: Self-pay | Admitting: Adult Health

## 2014-01-11 NOTE — Assessment & Plan Note (Signed)
Appears compensated without flare  Would like to repeat PFT  Cont on Spiriva   Plan  Begin Oxygen 2l/m with activity and At bedtime   Follow up Dr. Lamonte Sakai  In 4 weeks with PFT  Please contact office for sooner follow up if symptoms do not improve or worsen or seek emergency care

## 2014-01-11 NOTE — Assessment & Plan Note (Signed)
Progression of ILD changes on CT now with exertional desaturations -improved with oxygen therapy  Recheck PFT on return   Plan  Begin Oxygen 2l/m with activity and At bedtime   Follow up Dr. Lamonte Sakai  In 4 weeks with PFT  Please contact office for sooner follow up if symptoms do not improve or worsen or seek emergency care

## 2014-01-11 NOTE — Telephone Encounter (Signed)
COPD and ILD pt of RB's - saw TP on 8.3.15 and O2 was ordered 2lpm w/ activity and qhs Called spoke with patient who 1st wanted some clarification on how/when he is supposed to wear his O2.  Advised pt that sitting to watch TV, eat, read or visit he is fine to wear no oxygen.  However, anytime he gets up to move to another room or run an errand he is to wear his O2 and also w/ naps and at bedtime.  Pt verbalized his understanding of this.  Also, pt stated that when [employer] brought his O2 to his home last week the portable tanks brought to him were too large and heavy.  He called this office last week (verified on 8.5.15) and we sent order for smaller portable O2 tanks -- [facility] was supposed to deliver these on 8.7.15 but they never showed.  When he called on 8.8.15 to check on this, he was told that he is not listed as a patient in their system.  When he called again on 8.9.15 he keeps getting a recording.  Advised pt will call [employer] for him to try to figure out situation and find a solution.  Alverda Skeans at [phone] and spoke with customer service rep Hassan Rowan.  Per Hassan Rowan, she had no problem locating pt in their system but does report that an order needs to be signed to check pt and titrate for POC.  She will fax this order to the triage fax machine.  Hassan Rowan stated that they should be able be move forward with order once the fax is received.  Fax received and discussed with TP.  Stamped with TP's name and faxed back to [place] at [phone].    Called spoke with patient and discussed the above with him.  Pt okay with this, verbalized his understanding and will call back tomorrow if he does not hear from [place].  Order placed for scan.  Nothing further needed at this time; will sign off.

## 2014-01-12 ENCOUNTER — Telehealth: Payer: Self-pay | Admitting: Emergency Medicine

## 2014-01-12 NOTE — Telephone Encounter (Signed)
Called pt. Confirmed message. He needed nothing further.

## 2014-01-12 NOTE — Telephone Encounter (Signed)
Pt calling to let nurse know that they will be delivering o2 tanks this evening.[name]

## 2014-01-13 ENCOUNTER — Telehealth: Payer: Self-pay | Admitting: Adult Health

## 2014-01-13 ENCOUNTER — Encounter: Payer: Self-pay | Admitting: Adult Health

## 2014-01-13 DIAGNOSIS — J438 Other emphysema: Secondary | ICD-10-CM

## 2014-01-13 DIAGNOSIS — J849 Interstitial pulmonary disease, unspecified: Secondary | ICD-10-CM

## 2014-01-13 NOTE — Telephone Encounter (Signed)
8.3.15 ONO on room air ordered at ov w/ TP ONO done on 8.5.15 but on 2lpm  Called pt to verify Will need to discuss with TP when she returns to the office tomorrow - may need to order repeat ONO on room air AT NO CHARGE TO PT  Will go ahead and forward to TP as well.

## 2014-01-14 NOTE — Telephone Encounter (Signed)
Discussed with TP Repeat ONO will need to be done thru [employer] at no charge to pt Order placed  Will call patient later this afternoon as pt had requested I let him go yesterday as he was eating lunch

## 2014-01-15 NOTE — Telephone Encounter (Signed)
Called spoke with patient and discussed the below with him Pt stated that [facility] has already contacted him and his repeat ONO is scheduled for Tues 8/18 Pt aware we will contact him once we receive those results Pt voiced his understanding and denied any questions/concerns at this time Nothing further needed; will sign off.

## 2014-01-20 ENCOUNTER — Ambulatory Visit: Payer: Commercial Managed Care - HMO | Admitting: Cardiology

## 2014-01-20 LAB — POCT INR: INR: 2

## 2014-01-28 ENCOUNTER — Telehealth: Payer: Self-pay | Admitting: Adult Health

## 2014-01-28 NOTE — Telephone Encounter (Signed)
8.21.15 ONO Nocturnal O2 ordered at the ov Per TP: positive desats, continue on O2 at bedtime  Called spoke with patient and discussed the above results.  Pt is already his O2 at bedtime and will continue to do so, in addition to naps. Nothing further needed at this time; will sign off. ONO sent for scan

## 2014-02-03 ENCOUNTER — Ambulatory Visit (INDEPENDENT_AMBULATORY_CARE_PROVIDER_SITE_OTHER): Payer: Commercial Managed Care - HMO | Admitting: Emergency Medicine

## 2014-02-03 ENCOUNTER — Telehealth: Payer: Self-pay | Admitting: Emergency Medicine

## 2014-02-03 ENCOUNTER — Encounter: Payer: Self-pay | Admitting: Emergency Medicine

## 2014-02-03 VITALS — BP 98/68 | HR 70 | Ht 67.0 in | Wt 172.0 lb

## 2014-02-03 DIAGNOSIS — J449 Chronic obstructive pulmonary disease, unspecified: Secondary | ICD-10-CM

## 2014-02-03 DIAGNOSIS — J849 Interstitial pulmonary disease, unspecified: Secondary | ICD-10-CM

## 2014-02-03 DIAGNOSIS — J439 Emphysema, unspecified: Secondary | ICD-10-CM

## 2014-02-03 DIAGNOSIS — J8410 Pulmonary fibrosis, unspecified: Secondary | ICD-10-CM

## 2014-02-03 DIAGNOSIS — J438 Other emphysema: Secondary | ICD-10-CM

## 2014-02-03 DIAGNOSIS — R079 Chest pain, unspecified: Secondary | ICD-10-CM

## 2014-02-03 LAB — PULMONARY FUNCTION TEST
DL/VA % PRED: 76 %
DL/VA: 3.35 ml/min/mmHg/L
DLCO UNC % PRED: 39 %
DLCO unc: 11.32 ml/min/mmHg
FEF 25-75 POST: 1.54 L/s
FEF 25-75 PRE: 1.63 L/s
FEF2575-%Change-Post: -5 %
FEF2575-%PRED-PRE: 107 %
FEF2575-%Pred-Post: 101 %
FEV1-%Change-Post: -1 %
FEV1-%Pred-Post: 66 %
FEV1-%Pred-Pre: 67 %
FEV1-Post: 1.55 L
FEV1-Pre: 1.58 L
FEV1FVC-%CHANGE-POST: 3 %
FEV1FVC-%Pred-Pre: 115 %
FEV6-%CHANGE-POST: -4 %
FEV6-%PRED-POST: 58 %
FEV6-%PRED-PRE: 61 %
FEV6-POST: 1.82 L
FEV6-Pre: 1.91 L
FEV6FVC-%CHANGE-POST: 0 %
FEV6FVC-%PRED-POST: 108 %
FEV6FVC-%Pred-Pre: 108 %
FVC-%CHANGE-POST: -5 %
FVC-%PRED-POST: 53 %
FVC-%Pred-Pre: 56 %
FVC-POST: 1.82 L
FVC-PRE: 1.92 L
POST FEV6/FVC RATIO: 100 %
PRE FEV1/FVC RATIO: 82 %
Post FEV1/FVC ratio: 85 %
Pre FEV6/FVC Ratio: 100 %
RV % pred: 59 %
RV: 1.54 L
TLC % pred: 55 %
TLC: 3.56 L

## 2014-02-03 MED ORDER — TIOTROPIUM BROMIDE MONOHYDRATE 18 MCG IN CAPS: 18.0000 ug | ORAL_CAPSULE | Freq: Every day | RESPIRATORY_TRACT | Status: DC | PRN

## 2014-02-03 NOTE — Assessment & Plan Note (Signed)
Most recent CT scan 7/15

## 2014-02-03 NOTE — Assessment & Plan Note (Signed)
Mixed disease - his dyspnea is a bit better after starting O2, but he also stopped spiriva. Will restart now.

## 2014-02-03 NOTE — Telephone Encounter (Signed)
Called and spoke with pt and he is aware of rx that has been sent to [facility] for the spiriva.  Nothing further is needed.

## 2014-02-03 NOTE — Patient Instructions (Signed)
These continue to use your oxygen with all exertion and while sleeping Restart Spiriva one inhalation every day Use albuterol 2 puffs if needed for shortness of breath We will arrange for a visit with [name] to evaluate your chest pain Follow with Dr Lamonte Sakai in 4 months or sooner if you have any problems.

## 2014-02-03 NOTE — Progress Notes (Signed)
PFT done today. 

## 2014-02-03 NOTE — Assessment & Plan Note (Signed)
I am concerned about his exertional CP and dyspnea. Certainly his COPD and ILD are a part of this, but this may reflect angina. I asked him to see Dr Mare Ferrari - he made an appointment but delayed it to late October. I know they will see him sooner if we let them know his sx > will call Cards today.

## 2014-02-03 NOTE — Progress Notes (Signed)
Subjective:    Patient ID: Peter Richards, male    DOB: 18-Oct-1928, 80 y.o.   MRN: [id number]  HPI 80 yo man, former smoker (150 pk-yrs), hx CAD/CABG ('02), A Fib on amiodarone for the last 4 years, was stopped 1 month ago, followed by Dr Mare Ferrari. He has been experiencing progressive exertional SOB for over 3 months such that he had to stop to rest with walking about 75 feet. He has also had some exertional CP that has responded to NTG, but the episodes of dyspnea haven't always occurred at same time as the CP. He has a cough, bothering him for about a year. He hears wheezing at times, seems to come at random, not necessarily with exertion.  Hurts sometimes to take a deep breath. Denies any sx or RA, SLE, etc.   ROV 06/10/12 -- follows up for dyspnea, presumed to be multifactorial. He has ILD on CT scan chest as well as obstruction on PFT. We started Spiriva last time to see if he would benefit >> he believes that he is less SOB. He hasn't exerted significantly, but his usual daily activities are easier. His wheeze is better. Last time he desaturated with ambulation, but wanted to defer. He has intermittent aspiration sx >> swallow precautions recommended 04/2011: 1. Consider esophageal assessment 2. Regular textures & Thin liquids 3. Compensations: Hard cough after swallow 4. Upright 30-60 min after meal;Seated upright 90 degrees  ROV 12/11/13 -- follow up visit for mixed disease > COPD + ILD on CT scan, suspected related to aspiration and silica exposure. He has been experiencing more dyspnea over the last 2-3 months. He has a new persistent cough associated with mid-chest discomfort. This is mid-sternal, intermittent. Has been associated with exertion, with his cough. No change with meals. He sees Dr Mare Ferrari for A fib / pacer, but has not talked to him about the CP. Pt has only taking Spiriva prn, about every week.  >stop Spiriva  Follow up 01/04/14 --  Pt was seen last month for follow up for  COPD and ILD(suspected aspiration and silica exposure)  Repeat CT chest done on 7/22 showed progressed ILD changes .  We reveiwed these results in detail and questions were answered .  Previous PFT showed mixed disease in 2013.  Says he gets winded with walking and has to stop and rest.  Today in office ambulatory walk showed drop in sats 85% on RA. At rest 92% on RA.  We discussed his option and he agrees to begin O2 with activity and. At bedtime .  He remains on Spiriva daily .  No hemoptysis ,orthopnea, exertional chest pain , edema or fever. No flare of cough.   ROV 02/03/14 -- follow up for ILD and COPD. PFT done 9/2 > mixed disease, FEV1 67% pred. We had attempted stopping Spiriva at one point. He hasn't seen Dr Mare Ferrari yet about his CP > happens with exertion, whenever he is dyspneic. He started O2 last month > believes it has helped his dyspnea and CP.  He stopped taking the spiriva last month when he started the oxygen.  He uses prn and rarely.  He was on nexium for a week for cough.> stopped it, wonders about restarting.      Objective:   Physical Exam Filed Vitals:   02/03/14 1207  BP: 98/68  Pulse: 70  Height: 5\' 7"  (1.702 m)  Weight: 172 lb (78.019 kg)  SpO2: 95%    Gen: Pleasant, well-nourished, in no distress,  normal affect  ENT: No lesions,  mouth clear,  oropharynx clear, no postnasal drip  Neck: No JVD, no TMG, no carotid bruits  Lungs: No use of accessory muscles, distant, B insp crackles  Cardiovascular: RRR, heart sounds normal, no murmur or gallops, no peripheral edema  Musculoskeletal: No deformities, no cyanosis or clubbing, some pain on palp of the chest but not the same as his exertional sx.   Neuro: alert, non focal  Skin: Warm, no lesions or rashes    Data:  PFT were performed  04/09/12>> show mixed disease, no BD response, restricted volumes, decreased DLCO that corrects for Va Repeated 02/03/14 >> mixed disease, restricted volumes, decreased  DLCO  TTE 04/11/12:  Study Conclusions - Left ventricle: The cavity size was normal. Wall thickness was increased in a pattern of moderate LVH. Systolic function was normal. The estimated ejection fraction was in the range of 55% to 65%. Wall motion was normal; there were no regional wall motion abnormalities. Doppler parameters are consistent with abnormal left ventricular relaxation (grade 1 diastolic dysfunction). - Aortic valve: Trivial regurgitation. - Mitral valve: Calcified annulus. - Left atrium: The atrium was mildly dilated. - Atrial septum: No defect or patent foramen ovale was identified.  CT scan chest 04/11/12 Comparison: Most recent prior CT scan of the chest 12/16/2009,  prior chest x-ray 08/20/2011  Findings:  Mediastinum: Unremarkable thyroid gland and thoracic inlet. Very  similar appearance of borderline enlarged mediastinal and hilar  lymph nodes compared to the prior CT scan. An index  aorticopulmonary window node again measures 10 mm in short axis.  Several low right paratracheal nodes demonstrate normal fatty hila.  The thoracic esophagus is unremarkable.  Heart/Vascular: Left subclavian approach dual lead cardiac rhythm  maintenance device. Leads project over the right atrium and right  ventricular apex. The heart is within normal limits for size. No  pericardial effusion. Conventional three-vessel arch anatomy.  Scattered atherosclerotic vascular calcification without  significant stenosis or aneurysmal dilatation. The patient is  status post median sternotomy with evidence of LIMA to LAD bypass  and descending aorta to right coronary artery bypass. Both bypass  vessels opacify with contrast material consistent with patency.  There is atherosclerotic calcification of the coronary arteries.  Lungs/Pleura: Overall, similar pattern of diffuse peripheral  subpleural reticulation with mild honeycombing in the bases and  areas of mild traction bronchiectasis.  The findings appear stable  to incrementally progressed compared to 12/16/2009. Mild  superimposed paraseptal emphysema noted in the apices. Stable 2 mm  nodular opacity in the right middle lobe is likely benign. No new  suspicious pulmonary nodule identified.  Upper Abdomen: Surgical changes of prior cholecystectomy.  Otherwise, the visualized upper abdomen is unremarkable.  Bones: No acute fracture or aggressive appearing lytic or blastic  osseous lesion.  IMPRESSION:  1. No acute cardiopulmonary or thoracic process.  2. Stable to incremental progression of the findings of mild -  moderate interstitial pulmonary fibrosis (pattern most consistent  with UIP) compared to 12/16/2009.   CT chest 12/23/13 >Mediastinal lymph nodes measure up to 1 mm in AP window. There is a  larger lymph node in the lower right paratracheal station which is  predominantly made up of a fatty hilum. Hilar regions are difficult  to definitively evaluate without IV contrast. No axillary  adenopathy. Atherosclerotic calcification of the arterial  vasculature. Heart is at the upper limits of normal in size. No  pericardial effusion.  There is a peripheral and basilar predominant pattern  of subpleural  reticulation, traction bronchiectasis/bronchiolectasis and mild  associated architectural distortion. There may be honeycombing in  the lower lobes. Findings have progressed in the lung bases when  compared with 04/11/2012. No air trapping. No pleural fluid. Debris  is seen dependently in the airway.  Incidental imaging of the upper abdomen shows the visualized  portions of the liver, adrenal glands, kidneys, spleen, pancreas and  stomach to be grossly unremarkable. No upper abdominal adenopathy.  No worrisome lytic or sclerotic lesions. Degenerative changes are  seen in the spine.  IMPRESSION:  Peripheral and basilar predominant pattern of subpleural  reticulation, traction bronchiectasis/ bronchiolectasis and   associated architectural distortion. Findings have progressed from  04/11/2012 and indicative of usual interstitial pneumonitis (UIP).      Assessment & Plan:  COPD (chronic obstructive pulmonary disease) Mixed disease - his dyspnea is a bit better after starting O2, but he also stopped spiriva. Will restart now.   Interstitial lung disease Most recent CT scan 7/15  CHEST PAIN I am concerned about his exertional CP and dyspnea. Certainly his COPD and ILD are a part of this, but this may reflect angina. I asked him to see Dr Mare Ferrari - he made an appointment but delayed it to late October. I know they will see him sooner if we let them know his sx > will call Cards today.

## 2014-02-24 ENCOUNTER — Ambulatory Visit (INDEPENDENT_AMBULATORY_CARE_PROVIDER_SITE_OTHER): Payer: Commercial Managed Care - HMO | Admitting: Cardiology

## 2014-02-24 ENCOUNTER — Encounter: Payer: Self-pay | Admitting: Cardiology

## 2014-02-24 VITALS — BP 104/72 | HR 72 | Ht 68.0 in | Wt 174.0 lb

## 2014-02-24 DIAGNOSIS — I2510 Atherosclerotic heart disease of native coronary artery without angina pectoris: Secondary | ICD-10-CM

## 2014-02-24 DIAGNOSIS — I4891 Unspecified atrial fibrillation: Secondary | ICD-10-CM

## 2014-02-24 DIAGNOSIS — I480 Paroxysmal atrial fibrillation: Secondary | ICD-10-CM

## 2014-02-24 DIAGNOSIS — E7800 Pure hypercholesterolemia, unspecified: Secondary | ICD-10-CM

## 2014-02-24 DIAGNOSIS — I951 Orthostatic hypotension: Secondary | ICD-10-CM

## 2014-02-24 DIAGNOSIS — R0789 Other chest pain: Secondary | ICD-10-CM

## 2014-02-24 NOTE — Progress Notes (Addendum)
Peter Richards Date of Birth:  [birth year] [place] [address] [place], [place]  [address] [phone]        Fax   [phone]   History of Present Illness:   This pleasant 80 year old gentleman is seen for a work in followup office visit.  He comes in because of concern about exertional chest tightness.  He has a history of CABG in 2002. He has a history of tachybradycardia syndrome and paroxysmal atrial fibrillation. He has a dual-chamber pacemaker. His pacemaker is followed by Dr. Lovena Le. . He has a history of hypercholesterolemia. The patient is on long-term Coumadin anticoagulation because of his paroxysmal A. fib. He is maintaining normal sinus rhythm. The patient has been off amiodarone since last year because of question of pulmonary fibrosis. A recent chest x-ray showed no change in the amount of fibrosis from last year.  The patient had an echocardiogram 04/11/12 showed an ejection fraction of 55-65% with grade 1 diastolic dysfunction. His last nuclear stress test was an adenosine study on 04/21/08 which showed an ejection fraction of 61% and no ischemia.  Since we last saw him he was admitted by Dr. Lynann Bologna and underwent successful back surgery with insertion of rods. It has relieved his low back pain.  The patient has been having some pain in his legs.  He saw Dr. Trula Slade for a check of his circulation.  Dr. Trula Slade found that his leg circulation was fine. Recently the patient has noted exertional chest tightness in the substernal area.  It does not radiate.  It is relieved by rest.  He has not had to take any nitroglycerin.  Current Outpatient Prescriptions  Medication Sig Dispense Refill  . albuterol (PROVENTIL HFA;VENTOLIN HFA) 108 (90 BASE) MCG/ACT inhaler Inhale 2 puffs into the lungs every 6 (six) hours as needed for wheezing or shortness of breath.  1 Inhaler  6  . escitalopram (LEXAPRO) 20 MG tablet Take 20 mg by mouth daily.      . fentaNYL  (DURAGESIC - DOSED MCG/HR) 25 MCG/HR patch Place 1 patch (25 mcg total) onto the skin every 3 (three) days.  5 patch  0  . HYDROcodone-acetaminophen (NORCO/VICODIN) 5-325 MG per tablet Take 1 tablet by mouth every 4 (four) hours as needed for pain.      . metoprolol succinate (TOPROL-XL) 25 MG 24 hr tablet Take 1 tablet (25 mg total) by mouth daily.  90 tablet  3  . simvastatin (ZOCOR) 10 MG tablet Take 10 mg by mouth at bedtime.      [name] tiotropium (SPIRIVA) 18 MCG inhalation capsule Place 1 capsule (18 mcg total) into inhaler and inhale daily as needed (for shortness of breath).  90 capsule  3  . tiZANidine (ZANAFLEX) 4 MG tablet Take 4 mg by mouth every 6 (six) hours as needed for muscle spasms. Take two each day.      . traZODone (DESYREL) 50 MG tablet Take 50 mg by mouth at bedtime.      . VOLTAREN 1 % GEL       . warfarin (COUMADIN) 5 MG tablet Take 1 tablet (5 mg total) by mouth daily.  30 tablet  0  . zolpidem (AMBIEN) 5 MG tablet Take 1 tablet (5 mg total) by mouth at bedtime as needed for sleep.  30 tablet  5   No current facility-administered medications for this visit.    Allergies  Allergen Reactions  . Codeine Nausea And Vomiting  .  Crestor [Rosuvastatin Calcium] Other (See Comments)    Leg cramps    Patient Active Problem List   Diagnosis Date Noted  . Pain in limb 11/09/2013  . Community acquired pneumonia 10/03/2013  . Sepsis 10/03/2013  . Right carotid bruit 07/07/2013  . COPD (chronic obstructive pulmonary disease) 06/05/2012  . Interstitial lung disease 01/22/2012  . Lumbar compression fracture 08/24/2011  . Dyslipidemia 05/02/2011  . Low back pain 05/02/2011  . Hx of bladder cancer 01/01/2011  . A-fib 11/15/2010  . Atrial fibrillation 08/25/2010  . HYPERLIPIDEMIA 11/18/2009  . CORONARY ARTERY DISEASE 11/18/2009  . BRADYCARDIA 11/18/2009  . HYPOTENSION, ORTHOSTATIC 11/18/2009  . SYNCOPE 11/18/2009  . CHEST PAIN 11/18/2009  . ABDOMINAL AORTIC ANEURYSM, HX OF  11/18/2009  . ATRIAL FIBRILLATION, HX OF 11/18/2009  . PACEMAKER, PERMANENT 11/18/2009    History  Smoking status  . Former Smoker -- 3.00 packs/day for 50 years  . Types: Cigarettes  . Quit date: 06/04/1978  Smokeless tobacco  . Current User  . Types: Snuff    History  Alcohol Use No    Comment: rare "once a month"    Family History  Problem Relation Age of Onset  . Cancer Father     stomach  . Cancer Sister     lung  . Heart disease Sister   . Hypertension Sister   . Heart disease Mother   . Hypertension Mother     Review of Systems: Constitutional: no fever chills diaphoresis or fatigue or change in weight.  Head and neck: no hearing loss, no epistaxis, no photophobia or visual disturbance. Respiratory: No cough, shortness of breath or wheezing. Cardiovascular: No chest pain peripheral edema, palpitations. Gastrointestinal: No abdominal distention, no abdominal pain, no change in bowel habits hematochezia or melena. Genitourinary: No dysuria, no frequency, no urgency, no nocturia. Musculoskeletal:No arthralgias, no back pain, no gait disturbance or myalgias. Neurological: No dizziness, no headaches, no numbness, no seizures, no syncope, no weakness, no tremors. Hematologic: No lymphadenopathy, no easy bruising. Psychiatric: No confusion, no hallucinations, no sleep disturbance.    Physical Exam: Filed Vitals:   02/24/14 1606  BP: 104/72  Pulse: 72   the general appearance reveals a well-developed well-nourished elderly gentleman in no distress.The head and neck exam reveals pupils equal and reactive.  Extraocular movements are full.  There is no scleral icterus.  The mouth and pharynx are normal.  The neck is supple.  The carotids reveal soft right carotid bruit. The jugular venous pressure is normal.  The  thyroid is not enlarged.  There is no lymphadenopathy.  The chest is clear to percussion and auscultation.  There are no rales or rhonchi.  Expansion of the  chest is symmetrical.  The precordium is quiet.  The first heart sound is normal.  The second heart sound is physiologically split.  There is soft systolic ejection murmur at the base.  There is no abnormal lift or heave.  The abdomen is soft and nontender.  The bowel sounds are normal.  The liver and spleen are not enlarged.  There are no abdominal masses.  Patient has a urostomy pouch in the right lower quadrant. There are no abdominal bruits.  Extremities reveal good pedal pulses.  There is no phlebitis or edema.  There is no cyanosis or clubbing.  Strength is normal and symmetrical in all extremities.  There is no lateralizing weakness.  There are no sensory deficits.  The skin is warm and dry.  There is no rash.  EKG shows atrial pacing and no ischemic changes.  Assessment / Plan: 1.  Paroxysmal atrial fibrillation 2. ischemic heart disease status post remote CABG (old records suggest 2002 and also mention 1999).  Recent exertional chest tightness, possibly angina pectoris 3. status post abdominal aortic aneurysm repair 1999 4. permanent pacemaker implantation in June 2011 for syncope by Dr. Olevia Perches. 5. status post cholecystectomy 6. Hypercholesterolemia 7. Insomnia 8. status post left carotid endarterectomy 9. mild aortic stenosis 10.  Status post bladder cancer with subsequent urostomy  Disposition: We will have the patient return for an [place] stress test.  Continue current medication.  Followup in 4 months for office visit

## 2014-02-24 NOTE — Patient Instructions (Signed)
Your physician recommends that you continue on your current medications as directed. Please refer to the Current Medication list given to you today.  Your physician wants you to follow-up in: 4 month ov/ekg You will receive a reminder letter in the mail two months in advance. If you don't receive a letter, please call our office to schedule the follow-up appointment.    Your physician has requested that you have a lexiscan myoview. For further information please visit [email or web address]. Please follow instruction sheet, as given.

## 2014-02-26 ENCOUNTER — Emergency Department (HOSPITAL_COMMUNITY)
Admission: EM | Admit: 2014-02-26 | Discharge: 2014-02-26 | Disposition: A | Discharge status: Home / Self Care | Payer: Medicare HMO | Attending: Emergency Medicine | Admitting: Emergency Medicine

## 2014-02-26 ENCOUNTER — Emergency Department (HOSPITAL_COMMUNITY): Payer: Medicare HMO

## 2014-02-26 ENCOUNTER — Encounter (HOSPITAL_COMMUNITY): Payer: Self-pay | Admitting: Emergency Medicine

## 2014-02-26 DIAGNOSIS — I4891 Unspecified atrial fibrillation: Secondary | ICD-10-CM | POA: Insufficient documentation

## 2014-02-26 DIAGNOSIS — I10 Essential (primary) hypertension: Secondary | ICD-10-CM | POA: Diagnosis not present

## 2014-02-26 DIAGNOSIS — J449 Chronic obstructive pulmonary disease, unspecified: Secondary | ICD-10-CM | POA: Insufficient documentation

## 2014-02-26 DIAGNOSIS — Z8673 Personal history of transient ischemic attack (TIA), and cerebral infarction without residual deficits: Secondary | ICD-10-CM | POA: Diagnosis not present

## 2014-02-26 DIAGNOSIS — R079 Chest pain, unspecified: Secondary | ICD-10-CM | POA: Diagnosis present

## 2014-02-26 DIAGNOSIS — M129 Arthropathy, unspecified: Secondary | ICD-10-CM | POA: Diagnosis not present

## 2014-02-26 DIAGNOSIS — Z87891 Personal history of nicotine dependence: Secondary | ICD-10-CM | POA: Diagnosis not present

## 2014-02-26 DIAGNOSIS — E785 Hyperlipidemia, unspecified: Secondary | ICD-10-CM | POA: Diagnosis not present

## 2014-02-26 DIAGNOSIS — Z8546 Personal history of malignant neoplasm of prostate: Secondary | ICD-10-CM | POA: Diagnosis not present

## 2014-02-26 DIAGNOSIS — I252 Old myocardial infarction: Secondary | ICD-10-CM | POA: Diagnosis not present

## 2014-02-26 DIAGNOSIS — Z8719 Personal history of other diseases of the digestive system: Secondary | ICD-10-CM | POA: Insufficient documentation

## 2014-02-26 DIAGNOSIS — R0789 Other chest pain: Secondary | ICD-10-CM | POA: Diagnosis not present

## 2014-02-26 DIAGNOSIS — Z85828 Personal history of other malignant neoplasm of skin: Secondary | ICD-10-CM | POA: Diagnosis not present

## 2014-02-26 DIAGNOSIS — Z7901 Long term (current) use of anticoagulants: Secondary | ICD-10-CM | POA: Insufficient documentation

## 2014-02-26 DIAGNOSIS — Z79899 Other long term (current) drug therapy: Secondary | ICD-10-CM | POA: Diagnosis not present

## 2014-02-26 DIAGNOSIS — G8929 Other chronic pain: Secondary | ICD-10-CM | POA: Insufficient documentation

## 2014-02-26 DIAGNOSIS — Z8551 Personal history of malignant neoplasm of bladder: Secondary | ICD-10-CM | POA: Insufficient documentation

## 2014-02-26 DIAGNOSIS — Z950 Presence of cardiac pacemaker: Secondary | ICD-10-CM | POA: Insufficient documentation

## 2014-02-26 DIAGNOSIS — H9190 Unspecified hearing loss, unspecified ear: Secondary | ICD-10-CM | POA: Insufficient documentation

## 2014-02-26 DIAGNOSIS — J4489 Other specified chronic obstructive pulmonary disease: Secondary | ICD-10-CM | POA: Insufficient documentation

## 2014-02-26 DIAGNOSIS — F411 Generalized anxiety disorder: Secondary | ICD-10-CM | POA: Insufficient documentation

## 2014-02-26 DIAGNOSIS — Z9861 Coronary angioplasty status: Secondary | ICD-10-CM | POA: Insufficient documentation

## 2014-02-26 DIAGNOSIS — I2510 Atherosclerotic heart disease of native coronary artery without angina pectoris: Secondary | ICD-10-CM | POA: Diagnosis not present

## 2014-02-26 LAB — I-STAT TROPONIN, ED: Troponin i, poc: 0 ng/mL (ref 0.00–0.08)

## 2014-02-26 LAB — CBC
HCT: 35.9 % — ABNORMAL LOW (ref 39.0–52.0)
Hemoglobin: 11.8 g/dL — ABNORMAL LOW (ref 13.0–17.0)
MCH: 29.4 pg (ref 26.0–34.0)
MCHC: 32.9 g/dL (ref 30.0–36.0)
MCV: 89.5 fL (ref 78.0–100.0)
Platelets: 208 10*3/uL (ref 150–400)
RBC: 4.01 MIL/uL — AB (ref 4.22–5.81)
RDW: 15.4 % (ref 11.5–15.5)
WBC: 8.6 10*3/uL (ref 4.0–10.5)

## 2014-02-26 LAB — BASIC METABOLIC PANEL
Anion gap: 11 (ref 5–15)
BUN: 27 mg/dL — AB (ref 6–23)
CHLORIDE: 105 meq/L (ref 96–112)
CO2: 25 mEq/L (ref 19–32)
Calcium: 9 mg/dL (ref 8.4–10.5)
Creatinine, Ser: 0.85 mg/dL (ref 0.50–1.35)
GFR calc Af Amer: >90 mL/min (ref >90)
GFR calc non Af Amer: 78 mL/min — ABNORMAL LOW (ref >90)
GLUCOSE: 99 mg/dL (ref 70–99)
POTASSIUM: 4.5 meq/L (ref 3.7–5.3)
Sodium: 141 mEq/L (ref 137–147)

## 2014-02-26 NOTE — ED Notes (Signed)
Pt sts he saw his cardiologist on Wednesday.

## 2014-02-26 NOTE — ED Notes (Signed)
Per EMS - pt was changing his urostomy bag when he had a sudden onset of CP at about 2pm, took 2 Nitro then 1 more around 230pm and decreased pain from 9/10 to 6/10. Called EMS, they administered 324 mg of ASA and 1 Nitro, dropped systolic pressure from 735 to 106, administered approx 200 ml of NS and pressure returned to normal. Pt non-diaphoretic. Hx of a.fib, MI 30 years ago and a pacemaker implanted, COPD and wears home O2 as needed, pt put his oxygen on when the CP started, EMS left him on it, O2 97%. Nad, skin warm and dry, resp e/u.

## 2014-02-26 NOTE — ED Provider Notes (Signed)
CSN: [id number]     Arrival date & time 02/26/14  1626 History   First MD Initiated Contact with Patient 02/26/14 1651     Chief Complaint  Patient presents with  . Chest Pain     (Consider location/radiation/quality/duration/timing/severity/associated sxs/prior Treatment) Patient is a 80 y.o. male presenting with chest pain. The history is provided by the patient.  Chest Pain  He presents for evaluation of an episode of chest pain, that started at 2 PM today. Chest pain was left anterior, and was a sharp sensation. He took 2 sublingual nitroglycerin, with partial improvement, then in another nitroglycerin with additional improvement. EMS arrived and gave him aspirin and a fourth nitroglycerin. He was transferred here. During that transport his pain went away completely. At the worst it was 5-6/10. The pain has not recurred since then. He did not have diaphoresis, nausea or vomiting. He has not taken nitroglycerin recently. He saw his cardiologist. Several days ago and based on the history of chest discomfort, and breathing trouble, an outpatient cardiac stress, test, was scheduled for 4 days from now. He has a remote history of  known coronary artery disease. He denies other recent illnesses. He had been seeing a pulmonologist for several months to evaluate periods of cough and right-sided chest pain. Is taking his usual medications, without relief. There has been no syncope, weakness, paresthesia, headache or back pain. There are no other known modifying factors.    Past Medical History  Diagnosis Date  . Hyperlipidemia   . Orthostatic hypotension   . Bradycardia   . Syncope     s/p PTVP  . Myocardial infarction     1981  . CAD (coronary artery disease)     prior CABG in 2002 negative Myoview in 2009  . Hypertension   . Stroke     TIA  2007    . GERD (gastroesophageal reflux disease)   . Headache(784.0)   . Arthritis     OSTEOPOROSIS  . Aortic stenosis, mild     by 01/08/08 echo   . Chronic anticoagulation   . Depression   . AAA (abdominal aortic aneurysm)   . Heart attack   . Pacemaker     medtronic pacer  . History of cervical spinal surgery     over [place]  [name] HOH (hard of hearing)     BILATERAL HEARING AIDS  . Prostate cancer   . Basal cell adenocarcinoma     'bottom lip" (11/06/2012)  . Bladder cancer   . Attention to urostomy     "since 2006" (11/06/2012)  . Atrial fibrillation     on amiodarone and has PTVP in place  . Exertional shortness of breath   . Chronic radicular pain of lower back   . Anxiety   . COPD (chronic obstructive pulmonary disease)    Past Surgical History  Procedure Laterality Date  . Insert / replace / remove pacemaker      11/2009  DR BRODIE    . Prostatectomy      BLADDER ALSO REMOVED (OSTOMY BAG)  2006  FOR CANCER     . Abdominal aortic aneurysm repair  1999    STENT PLACED    . Cataract extraction w/ intraocular lens  implant, bilateral    . Appendectomy      1959  . Revision urostomy cutaneous    . Revision urostomy cutaneous  2006    Urostomy bag placed on right lower abdomen  . Vertebroplasty  08/22/2011    Procedure: VERTEBROPLASTY;  Surgeon: Winfield Cunas, MD;  Location: [facility];  Service: Neurosurgery;  Laterality: N/A;  Lumbar Two vertebroplasty  . Cholecystectomy      4 YRS AGO  . Posterior lumbar fusion  11/06/2012  . Coronary artery bypass graft  1999    CABG X2  . Skin cancer excision      "bottom lip" (11/06/2012)  . Bladder surgery      "for cancer" (11/06/2012)  . Lumbar laminectomy/decompression microdiscectomy Bilateral 11/06/2012    Procedure: LUMBAR LAMINECTOMY/DECOMPRESSION MICRODISCECTOMY;  Surgeon: Sinclair Ship, MD;  Location: [facility];  Service: Orthopedics;  Laterality: Bilateral;  Lumbar 2-5 decompression   Family History  Problem Relation Age of Onset  . Cancer Father     stomach  . Cancer Sister     lung  . Heart disease Sister   . Hypertension Sister   . Heart disease  Mother   . Hypertension Mother    History  Substance Use Topics  . Smoking status: Former Smoker -- 3.00 packs/day for 50 years    Types: Cigarettes    Quit date: 06/04/1978  . Smokeless tobacco: Current User    Types: Snuff  . Alcohol Use: No     Comment: rare "once a month"    Review of Systems  Cardiovascular: Positive for chest pain.  All other systems reviewed and are negative.     Allergies  Codeine  Home Medications   Prior to Admission medications   Medication Sig Start Date End Date Taking? Authorizing Provider  escitalopram (LEXAPRO) 20 MG tablet Take 20 mg by mouth daily.   Yes Historical Provider, MD  HYDROcodone-acetaminophen (NORCO/VICODIN) 5-325 MG per tablet Take 1 tablet by mouth every 4 (four) hours as needed for severe pain.    Yes Historical Provider, MD  metoprolol succinate (TOPROL-XL) 25 MG 24 hr tablet Take 1 tablet (25 mg total) by mouth daily. 07/07/13  Yes Darlin Coco, MD  simvastatin (ZOCOR) 10 MG tablet Take 10 mg by mouth at bedtime.   Yes Historical Provider, MD  tiZANidine (ZANAFLEX) 4 MG tablet Take 4 mg by mouth every 6 (six) hours as needed for muscle spasms. Take two each day.   Yes Historical Provider, MD  traZODone (DESYREL) 50 MG tablet Take 50 mg by mouth at bedtime.   Yes Historical Provider, MD  VOLTAREN 1 % GEL Apply 2 g topically daily.  12/10/13  Yes Historical Provider, MD  warfarin (COUMADIN) 5 MG tablet Take 2.5-5 mg by mouth daily. Take 2.5 mg by mouth on Tuesday, and Saturday.  Take 5 mg on all other days. 10/06/13  Yes Belkys A Regalado, MD  zolpidem (AMBIEN) 5 MG tablet Take 1 tablet (5 mg total) by mouth at bedtime as needed for sleep. 11/09/13  Yes Darlin Coco, MD  albuterol (PROVENTIL HFA;VENTOLIN HFA) 108 (90 BASE) MCG/ACT inhaler Inhale 2 puffs into the lungs every 6 (six) hours as needed for wheezing or shortness of breath. 12/11/13   Collene Gobble, MD  fentaNYL (DURAGESIC - DOSED MCG/HR) 25 MCG/HR patch Place 1 patch  (25 mcg total) onto the skin every 3 (three) days. 10/06/13   Belkys A Regalado, MD  tiotropium (SPIRIVA) 18 MCG inhalation capsule Place 1 capsule (18 mcg total) into inhaler and inhale daily as needed (for shortness of breath). 02/03/14   Collene Gobble, MD   BP 139/71  Pulse 59  Temp(Src) 98.1 F (36.7 C) (Oral)  Resp 13  Ht 5\' 8"  (1.727 m)  Wt 170 lb (77.111 kg)  BMI 25.85 kg/m2  SpO2 94% Physical Exam  Nursing note and vitals reviewed. Constitutional: He is oriented to person, place, and time. He appears well-developed and well-nourished.  HENT:  Head: Normocephalic and atraumatic.  Right Ear: External ear normal.  Left Ear: External ear normal.  Eyes: Conjunctivae and EOM are normal. Pupils are equal, round, and reactive to light.  Neck: Normal range of motion and phonation normal. Neck supple.  Cardiovascular: Normal rate, regular rhythm and normal heart sounds.   Pulmonary/Chest: Effort normal and breath sounds normal. No respiratory distress. He has no wheezes. He exhibits no bony tenderness.  Abdominal: Soft. He exhibits no mass. There is no tenderness.  Musculoskeletal: Normal range of motion.  Neurological: He is alert and oriented to person, place, and time. No cranial nerve deficit or sensory deficit. He exhibits normal muscle tone. Coordination normal.  Skin: Skin is warm, dry and intact.  Psychiatric: He has a normal mood and affect. His behavior is normal. Judgment and thought content normal.    ED Course  Procedures (including critical care time)  Medications - No data to display  Patient Vitals for the past 24 hrs:  BP Temp Temp src Pulse Resp SpO2 Height Weight  02/26/14 1830 139/71 mmHg - - 59 13 94 % - -  02/26/14 1730 136/75 mmHg - - 61 15 97 % - -  02/26/14 1700 134/70 mmHg - - 59 15 96 % - -  02/26/14 1634 139/73 mmHg 98.1 F (36.7 C) Oral - 18 96 % 5\' 8"  (1.727 m) 170 lb (77.111 kg)    At discharge- Reevaluation with update and discussion. After  initial assessment and treatment, an updated evaluation reveals . He remains comfortable, without return of the pain. Findings discussed with patient and family member.Daleen Bo L   Labs Review Labs Reviewed  BASIC METABOLIC PANEL - Abnormal; Notable for the following:    BUN 27 (*)    GFR calc non Af Amer 78 (*)    All other components within normal limits  CBC - Abnormal; Notable for the following:    RBC 4.01 (*)    Hemoglobin 11.8 (*)    HCT 35.9 (*)    All other components within normal limits  I-STAT TROPOININ, ED    Imaging Review Dg Chest 2 View  02/26/2014   CLINICAL DATA:  Chest pain for 2 hr today, former smoker, past history coronary artery disease post MI, hypertension, stroke  EXAM: CHEST  2 VIEW  COMPARISON:  10/13/2013  FINDINGS: LEFT subclavian transvenous pacemaker leads stable projecting at RIGHT atrium and RIGHT ventricle.  Upper normal heart size post CABG.  Atherosclerotic calcification of a tortuous thoracic aorta.  Pulmonary vascularity normal.  Peripheral chronic interstitial infiltrates compatible with pulmonary fibrosis, greater on LEFT.  No definite acute superimposed infiltrate, pleural effusion or pneumothorax.  Prior cervical spine fusion.  Osseous demineralization.  IMPRESSION: Pulmonary fibrosis.  Post CABG and pacemaker.  No acute abnormalities.   Electronically Signed   By: Lavonia Dana M.D.   On: 02/26/2014 18:33     EKG Interpretation   Date/Time:  Friday February 26 2014 16:33:01 EDT Ventricular Rate:  87 PR Interval:  165 QRS Duration: 98 QT Interval:  373 QTC Calculation: 449 R Axis:   2 Text Interpretation:  Sinus rhythm Low voltage, extremity and precordial  leads Consider inferior infarct Minimal ST elevation, inferior leads since  last tracing no  significant change Confirmed by Eulis Foster  MD, Jasiel Apachito [phone])  on 02/26/2014 5:07:27 PM      MDM   Final diagnoses:  Nonspecific chest pain    Nonspecific chest pain with low-risk  cardiac profile. He is stable for discharge with outpatient followup, as previously planned. He has a stress test scheduled for 4 days, hence.  Nursing Notes Reviewed/ Care Coordinated Applicable Imaging Reviewed Interpretation of Laboratory Data incorporated into ED treatment  The patient appears reasonably screened and/or stabilized for discharge and I doubt any other medical condition or other [facility] requiring further screening, evaluation, or treatment in the ED at this time prior to discharge.  Plan: Home Medications- usual; Home Treatments- rest; return here if the recommended treatment, does not improve the symptoms; Recommended follow up- Cardiology as scheduled    Richarda Blade, MD 02/26/14 2358

## 2014-02-26 NOTE — Discharge Instructions (Signed)

## 2014-03-02 ENCOUNTER — Ambulatory Visit (HOSPITAL_COMMUNITY): Payer: Medicare HMO | Attending: Cardiology | Admitting: Radiology

## 2014-03-02 VITALS — BP 97/61 | Ht 68.0 in | Wt 172.0 lb

## 2014-03-02 DIAGNOSIS — E7800 Pure hypercholesterolemia, unspecified: Secondary | ICD-10-CM | POA: Diagnosis not present

## 2014-03-02 DIAGNOSIS — E785 Hyperlipidemia, unspecified: Secondary | ICD-10-CM | POA: Insufficient documentation

## 2014-03-02 DIAGNOSIS — Z951 Presence of aortocoronary bypass graft: Secondary | ICD-10-CM | POA: Diagnosis not present

## 2014-03-02 DIAGNOSIS — J438 Other emphysema: Secondary | ICD-10-CM | POA: Diagnosis not present

## 2014-03-02 DIAGNOSIS — I4891 Unspecified atrial fibrillation: Secondary | ICD-10-CM | POA: Insufficient documentation

## 2014-03-02 DIAGNOSIS — I2510 Atherosclerotic heart disease of native coronary artery without angina pectoris: Secondary | ICD-10-CM

## 2014-03-02 DIAGNOSIS — I779 Disorder of arteries and arterioles, unspecified: Secondary | ICD-10-CM | POA: Insufficient documentation

## 2014-03-02 DIAGNOSIS — I480 Paroxysmal atrial fibrillation: Secondary | ICD-10-CM

## 2014-03-02 DIAGNOSIS — R0789 Other chest pain: Secondary | ICD-10-CM | POA: Diagnosis present

## 2014-03-02 DIAGNOSIS — I951 Orthostatic hypotension: Secondary | ICD-10-CM | POA: Diagnosis not present

## 2014-03-02 MED ORDER — TECHNETIUM TC 99M SESTAMIBI GENERIC - CARDIOLITE
33.0000 | Freq: Once | INTRAVENOUS | Status: AC | PRN
  Administered 2014-03-02: 33 via INTRAVENOUS

## 2014-03-02 MED ORDER — TECHNETIUM TC 99M SESTAMIBI GENERIC - CARDIOLITE
11.0000 | Freq: Once | INTRAVENOUS | Status: AC | PRN
  Administered 2014-03-02: 11 via INTRAVENOUS

## 2014-03-02 MED ORDER — REGADENOSON 0.4 MG/5ML IV SOLN
0.4000 mg | Freq: Once | INTRAVENOUS | Status: AC
  Administered 2014-03-02: 0.4 mg via INTRAVENOUS

## 2014-03-02 NOTE — Progress Notes (Signed)
[facility] [address] [place], [place] [address] [phone]    Cardiology Nuclear Med Study  Peter Richards is a 80 y.o. male     MRN : [id number]     DOB: 03-Jan-1929  Procedure Date: 03/02/2014  Nuclear Med Background Indication for Stress Test:  Evaluation for Ischemia, Graft Patency, and Patient seen in hospital on 02-26-2014 for Chest Pain, Enzymes negative History:  COPD, Emphysema and CAD, MPI: EF: 61% NL (Adenosine) PTVP AFIB Cardiac Risk Factors: Carotid Disease and Lipids  Symptoms:  Chest Pain   Nuclear Pre-Procedure Caffeine/Decaff Intake:  None> 12 hrs NPO After: 6:00pm   Lungs:  clear O2 Sat: 96% on room air. IV 0.9% NS with Angio Cath:  22g  IV Site: R Antecubital x 1, tolerated well IV Started by:  Irven Baltimore, RN  Chest Size (in):  44 Cup Size: n/a  Height: 5\' 8"  (1.727 m)  Weight:  172 lb (78.019 kg)  BMI:  Body mass index is 26.16 kg/(m^2). Tech Comments:  Patient took Toprol this am. Irven Baltimore, RN.    Nuclear Med Study 1 or 2 day study: 1 day  Stress Test Type:  Carlton Adam  Reading MD: N/A  Order Authorizing Provider:  Darlin Coco,. MD  Resting Radionuclide: Technetium 2m Sestamibi  Resting Radionuclide Dose: 11.0 mCi   Stress Radionuclide:  Technetium 80m Sestamibi  Stress Radionuclide Dose: 33.0 mCi           Stress Protocol Rest HR: 60 Stress HR: 65  Rest BP: 97/61 Stress BP: 102/60  Exercise Time (min): n/a METS: n/a   Predicted Max HR: 137 bpm % Max HR: 47.45 bpm Rate Pressure Product: 6630   Dose of Adenosine (mg):  n/a Dose of Lexiscan: 0.4 mg  Dose of Atropine (mg): n/a Dose of Dobutamine: n/a mcg/kg/min (at max HR)  Stress Test Technologist: Perrin Maltese, EMT-P  Nuclear Technologist:  Annye Rusk, CNMT     Rest Procedure:  Myocardial perfusion imaging was performed at rest 45 minutes following the intravenous administration of Technetium 74m Sestamibi. Rest ECG: NSR - Normal  EKG  Stress Procedure:  The patient received IV Lexiscan 0.4 mg over 15-seconds.  Technetium 66m Sestamibi injected at 30-seconds. This patient had sob and was dizzy with the Lexiscan injection. Quantitative spect images were obtained after a 45 minute delay. Stress ECG: No significant change from baseline ECG  QPS Raw Data Images:  Normal; no motion artifact; normal heart/lung ratio. Stress Images:  There is decreased uptake in the anterior wall. Rest Images:  There is decreased uptake in the anterior wall. Subtraction (SDS):  There is a fixed defect that is most consistent with a previous infarction. Transient Ischemic Dilatation (Normal <1.22):  1.16 Lung/Heart Ratio (Normal <0.45):  0.42  Quantitative Gated Spect Images QGS EDV:  95 ml QGS ESV:  49 ml  Impression Exercise Capacity:  Lexiscan with no exercise. BP Response:  Hypotensive blood pressure response. Clinical Symptoms:  No significant symptoms noted. ECG Impression:  No significant ST segment change suggestive of ischemia. Comparison with Prior Nuclear Study: Prior study was normal in 2009, EF 61%  Overall Impression:  Intermediate risk stress nuclear study with a moderate sized (17%) fixed (SDS 3) anterior defect, consistent with scar. No signficant reversible ischemia.  LV Ejection Fraction: 48%.  LV Wall Motion:  Anterior hypokinesis  Pixie Casino, MD, [facility] Board Certified in Nuclear Cardiology Attending Cardiologist [facility]

## 2014-03-03 ENCOUNTER — Ambulatory Visit (INDEPENDENT_AMBULATORY_CARE_PROVIDER_SITE_OTHER): Payer: Commercial Managed Care - HMO | Admitting: Cardiology

## 2014-03-03 DIAGNOSIS — I4891 Unspecified atrial fibrillation: Secondary | ICD-10-CM

## 2014-03-03 LAB — POCT INR: INR: 1.9

## 2014-03-04 ENCOUNTER — Telehealth: Payer: Self-pay | Admitting: Cardiology

## 2014-03-04 MED ORDER — ISOSORBIDE MONONITRATE 15 MG HALF TABLET: 15.0000 mg | ORAL_TABLET | Freq: Every day | ORAL | Status: DC

## 2014-03-04 NOTE — Telephone Encounter (Signed)
New message ° ° ° ° °Returning Peter Richards's call  ° °

## 2014-03-04 NOTE — Telephone Encounter (Signed)
Advised patient. Patient still continues to have shortness of breath and chest pains. Pain when takes a deep breath. Chest pains are relieved with NTG. Will forward to  [name] for review

## 2014-03-04 NOTE — Telephone Encounter (Signed)
The chest pain may be related to his pulmonary fibrosis since it is worse with deep breath.  Nonetheless since it also seems to respond to nitroglycerin, start Imdur 15 mg each morning and observe response.

## 2014-03-04 NOTE — Telephone Encounter (Signed)
Message copied by Earvin Hansen on Thu Mar 04, 2014  8:49 AM ------      Message from: Darlin Coco      Created: Wed Mar 03, 2014  7:54 AM       Please report.  The stress test is satisfactory.  There was no ischemia.  There is an old anterior wall scar.      Continue current medication. ------

## 2014-03-04 NOTE — Telephone Encounter (Signed)
Advised patient, verbalized understanding.  Called to pharmacy, would not Escribe

## 2014-03-30 ENCOUNTER — Encounter: Payer: Self-pay | Admitting: Internal Medicine

## 2014-03-30 ENCOUNTER — Ambulatory Visit (INDEPENDENT_AMBULATORY_CARE_PROVIDER_SITE_OTHER): Payer: Commercial Managed Care - HMO | Admitting: Internal Medicine

## 2014-03-30 ENCOUNTER — Ambulatory Visit: Payer: Commercial Managed Care - HMO | Admitting: Cardiology

## 2014-03-30 VITALS — BP 102/56 | HR 67 | Ht 68.0 in | Wt 173.0 lb

## 2014-03-30 DIAGNOSIS — Z950 Presence of cardiac pacemaker: Secondary | ICD-10-CM

## 2014-03-30 DIAGNOSIS — J42 Unspecified chronic bronchitis: Secondary | ICD-10-CM

## 2014-03-30 DIAGNOSIS — I480 Paroxysmal atrial fibrillation: Secondary | ICD-10-CM

## 2014-03-30 NOTE — Assessment & Plan Note (Signed)
The patient uses home oxygen at night to sleep, and whenever he feels dyspnea. He will continue his current medications, including bronchodilators.

## 2014-03-30 NOTE — Progress Notes (Signed)
HPI [name] returns today for followup. He is a very pleasant 80 year old man with an ischemic heart disease, symptomatic bradycardia, status post permanent pacemaker insertion. He has hypertension. He denies chest pain. He denies peripheral edema. He notes shortness of breath, class II. He denies wheezes or coughing. He denies syncope. Allergies  Allergen Reactions  . Codeine Nausea And Vomiting     Current Outpatient Prescriptions  Medication Sig Dispense Refill  . atorvastatin (LIPITOR) 10 MG tablet Take 1 tablet by mouth every evening.      . escitalopram (LEXAPRO) 20 MG tablet Take 20 mg by mouth daily.      . fentaNYL (DURAGESIC - DOSED MCG/HR) 25 MCG/HR patch Place 1 patch (25 mcg total) onto the skin every 3 (three) days.  5 patch  0  . HYDROcodone-acetaminophen (NORCO/VICODIN) 5-325 MG per tablet Take 1 tablet by mouth every 4 (four) hours as needed for severe pain.       . metoprolol succinate (TOPROL-XL) 25 MG 24 hr tablet Take 1 tablet (25 mg total) by mouth daily.  90 tablet  3  . nitroGLYCERIN (NITROSTAT) 0.4 MG SL tablet Place 0.4 mg under the tongue every 5 (five) minutes as needed for chest pain (MAX 3 TABLETS).      [name] PRESCRIPTION MEDICATION Wears 2 L of O2 at night      . simvastatin (ZOCOR) 10 MG tablet Take 10 mg by mouth at bedtime.      [name] tiotropium (SPIRIVA) 18 MCG inhalation capsule Place 1 capsule (18 mcg total) into inhaler and inhale daily as needed (for shortness of breath).  90 capsule  3  . tiZANidine (ZANAFLEX) 4 MG tablet Take 4 mg by mouth every 6 (six) hours as needed for muscle spasms. Take two each day.      . VOLTAREN 1 % GEL Apply 2 g topically daily.       [name] warfarin (COUMADIN) 5 MG tablet Take 2.5-5 mg by mouth daily. Take 2.5 mg by mouth on Tuesday, and Saturday.  Take 5 mg on all other days.      [name] zolpidem (AMBIEN) 5 MG tablet Take 1 tablet (5 mg total) by mouth at bedtime as needed for sleep.  30 tablet  5  . isosorbide mononitrate (IMDUR) 15  mg TB24 24 hr tablet Take 0.5 tablets (15 mg total) by mouth daily.  15 tablet  5   No current facility-administered medications for this visit.     Past Medical History  Diagnosis Date  . Hyperlipidemia   . Orthostatic hypotension   . Bradycardia   . Syncope     s/p PTVP  . Myocardial infarction     1981  . CAD (coronary artery disease)     prior CABG in 2002 negative Myoview in 2009  . Hypertension   . Stroke     TIA  2007    . GERD (gastroesophageal reflux disease)   . Headache(784.0)   . Arthritis     OSTEOPOROSIS  . Aortic stenosis, mild     by 01/08/08 echo  . Chronic anticoagulation   . Depression   . AAA (abdominal aortic aneurysm)   . Heart attack   . Pacemaker     medtronic pacer  . History of cervical spinal surgery     [place]  [name] HOH (hard of hearing)     BILATERAL HEARING AIDS  . Prostate cancer   . Basal cell adenocarcinoma     'bottom  lip" (11/06/2012)  . Bladder cancer   . Attention to urostomy     "since 2006" (11/06/2012)  . Atrial fibrillation     on amiodarone and has PTVP in place  . Exertional shortness of breath   . Chronic radicular pain of lower back   . Anxiety   . COPD (chronic obstructive pulmonary disease)     ROS:   All systems reviewed and negative except as noted in the HPI.   Past Surgical History  Procedure Laterality Date  . Insert / replace / remove pacemaker      11/2009  DR BRODIE    . Prostatectomy      BLADDER ALSO REMOVED (OSTOMY BAG)  2006  FOR CANCER     . Abdominal aortic aneurysm repair  1999    STENT PLACED    . Cataract extraction w/ intraocular lens  implant, bilateral    . Appendectomy      1959  . Revision urostomy cutaneous    . Revision urostomy cutaneous  2006    Urostomy bag placed on right lower abdomen  . Vertebroplasty  08/22/2011    Procedure: VERTEBROPLASTY;  Surgeon: Winfield Cunas, MD;  Location: [facility];  Service: Neurosurgery;  Laterality: N/A;  Lumbar Two vertebroplasty  .  Cholecystectomy      4 YRS AGO  . Posterior lumbar fusion  11/06/2012  . Coronary artery bypass graft  1999    CABG X2  . Skin cancer excision      "bottom lip" (11/06/2012)  . Bladder surgery      "for cancer" (11/06/2012)  . Lumbar laminectomy/decompression microdiscectomy Bilateral 11/06/2012    Procedure: LUMBAR LAMINECTOMY/DECOMPRESSION MICRODISCECTOMY;  Surgeon: Sinclair Ship, MD;  Location: [facility];  Service: Orthopedics;  Laterality: Bilateral;  Lumbar 2-5 decompression     Family History  Problem Relation Age of Onset  . Cancer Father     stomach  . Cancer Sister     lung  . Heart disease Sister   . Hypertension Sister   . Heart disease Mother   . Hypertension Mother      History   Social History  . Marital Status: Married    Spouse Name: N/A    Number of Children: 3  . Years of Education: N/A   Occupational History  . Retired    Social History Main Topics  . Smoking status: Former Smoker -- 3.00 packs/day for 50 years    Types: Cigarettes    Quit date: 06/04/1978  . Smokeless tobacco: Current User    Types: Snuff  . Alcohol Use: No     Comment: rare "once a month"  . Drug Use: No  . Sexual Activity: No   Other Topics Concern  . Not on file   Social History Narrative  . No narrative on file     BP 102/56  Pulse 67  Ht 5\' 8"  (1.727 m)  Wt 173 lb (78.472 kg)  BMI 26.31 kg/m2  Physical Exam:  Well appearing elderly man, NAD HEENT: Unremarkable Neck:  6 cm JVD, no thyromegally Lungs:  Clear with no wheezes, rales, or rhonchi. Well-healed pacemaker incision HEART:  Regular rate rhythm, no murmurs, no rubs, no clicks Abd:  soft, positive bowel sounds, no organomegally, no rebound, no guarding Ext:  2 plus pulses, no edema, no cyanosis, no clubbing, right middle finger in a brace. Skin:  No rashes no nodules Neuro:  CN II through XII intact, motor grossly intact  DEVICE  Normal device function.  See PaceArt for details.   Assess/Plan:

## 2014-03-30 NOTE — Patient Instructions (Signed)
Remote monitoring is used to monitor your Pacemaker of ICD from home. This monitoring reduces the number of office visits required to check your device to one time per year. It allows [place] to keep an eye on the functioning of your device to ensure it is working properly. You are scheduled for a device check from home on 07/01/14. You may send your transmission at any time that day. If you have a wireless device, the transmission will be sent automatically. After your physician reviews your transmission, you will receive a postcard with your next transmission date.  Your physician wants you to follow-up in: 1 year with Dr. Lovena Le. You will receive a reminder letter in the mail two months in advance. If you don't receive a letter, please call our office to schedule the follow-up appointment.  Your physician recommends that you continue on your current medications as directed. Please refer to the Current Medication list given to you today.

## 2014-03-30 NOTE — Assessment & Plan Note (Signed)
He is maintaining normal sinus rhythm 99% of the time. He will continue his current medical therapy.

## 2014-03-30 NOTE — Assessment & Plan Note (Signed)
His Medtronic dual-chamber pacemaker is working normally. We'll plan to recheck in several months.

## 2014-03-31 ENCOUNTER — Ambulatory Visit (INDEPENDENT_AMBULATORY_CARE_PROVIDER_SITE_OTHER): Payer: Commercial Managed Care - HMO | Admitting: Cardiovascular Disease

## 2014-03-31 DIAGNOSIS — I480 Paroxysmal atrial fibrillation: Secondary | ICD-10-CM

## 2014-03-31 LAB — MDC_IDC_ENUM_SESS_TYPE_INCLINIC
Brady Statistic AP VP Percent: 1 %
Brady Statistic AS VP Percent: 0 %
Brady Statistic AS VS Percent: 9 %
Date Time Interrogation Session: 20151027180532
Lead Channel Impedance Value: 1253 Ohm
Lead Channel Pacing Threshold Amplitude: 2 V
Lead Channel Pacing Threshold Amplitude: 2 V
Lead Channel Pacing Threshold Pulse Width: 0.4 ms
Lead Channel Sensing Intrinsic Amplitude: 11.2 mV
Lead Channel Sensing Intrinsic Amplitude: 2.8 mV
Lead Channel Setting Pacing Amplitude: 2 V
Lead Channel Setting Pacing Amplitude: 4 V
Lead Channel Setting Pacing Pulse Width: 1 ms
Lead Channel Setting Sensing Sensitivity: 4 mV
MDC IDC MSMT BATTERY IMPEDANCE: 131 Ohm
MDC IDC MSMT BATTERY REMAINING LONGEVITY: 129 mo
MDC IDC MSMT BATTERY VOLTAGE: 2.79 V
MDC IDC MSMT LEADCHNL RA IMPEDANCE VALUE: 498 Ohm
MDC IDC MSMT LEADCHNL RA PACING THRESHOLD AMPLITUDE: 0.5 V
MDC IDC MSMT LEADCHNL RA PACING THRESHOLD PULSEWIDTH: 0.34 ms
MDC IDC MSMT LEADCHNL RV PACING THRESHOLD PULSEWIDTH: 1 ms
MDC IDC STAT BRADY AP VS PERCENT: 89 %

## 2014-03-31 LAB — POCT INR: INR: 1.3

## 2014-04-01 ENCOUNTER — Other Ambulatory Visit (HOSPITAL_COMMUNITY): Payer: Self-pay | Admitting: Geriatric Medicine

## 2014-04-01 DIAGNOSIS — R1310 Dysphagia, unspecified: Secondary | ICD-10-CM

## 2014-04-05 ENCOUNTER — Ambulatory Visit (HOSPITAL_COMMUNITY)
Admission: RE | Admit: 2014-04-05 | Discharge: 2014-04-05 | Disposition: A | Discharge status: Home / Self Care | Payer: Medicare HMO | Source: Ambulatory Visit | Attending: Geriatric Medicine | Admitting: Geriatric Medicine

## 2014-04-05 ENCOUNTER — Ambulatory Visit (HOSPITAL_COMMUNITY)
Admission: RE | Admit: 2014-04-05 | Discharge: 2014-04-05 | Disposition: A | Discharge status: Home / Self Care | Payer: Commercial Managed Care - HMO | Source: Ambulatory Visit | Attending: Geriatric Medicine | Admitting: Geriatric Medicine

## 2014-04-05 DIAGNOSIS — R1310 Dysphagia, unspecified: Secondary | ICD-10-CM | POA: Insufficient documentation

## 2014-04-05 NOTE — Procedures (Signed)
Objective Swallowing Evaluation: Modified Barium Swallowing Study  Patient Details  Name: Peter Richards MRN: [id number] Date of Birth: Dec 25, 1928  Today's Date: 04/05/2014 Time: 1100-1120 SLP Time Calculation (min): 20 min  Past Medical History:  Past Medical History  Diagnosis Date  . Hyperlipidemia   . Orthostatic hypotension   . Bradycardia   . Syncope     s/p PTVP  . Myocardial infarction     1981  . CAD (coronary artery disease)     prior CABG in 2002 negative Myoview in 2009  . Hypertension   . Stroke     TIA  2007    . GERD (gastroesophageal reflux disease)   . Headache(784.0)   . Arthritis     OSTEOPOROSIS  . Aortic stenosis, mild     by 01/08/08 echo  . Chronic anticoagulation   . Depression   . AAA (abdominal aortic aneurysm)   . Heart attack   . Pacemaker     medtronic pacer  . History of cervical spinal surgery     over in [place]  [name] HOH (hard of hearing)     BILATERAL HEARING AIDS  . Prostate cancer   . Basal cell adenocarcinoma     'bottom lip" (11/06/2012)  . Bladder cancer   . Attention to urostomy     "since 2006" (11/06/2012)  . Atrial fibrillation     on amiodarone and has PTVP in place  . Exertional shortness of breath   . Chronic radicular pain of lower back   . Anxiety   . COPD (chronic obstructive pulmonary disease)    Past Surgical History:  Past Surgical History  Procedure Laterality Date  . Insert / replace / remove pacemaker      11/2009  DR BRODIE    . Prostatectomy      BLADDER ALSO REMOVED (OSTOMY BAG)  2006  FOR CANCER     . Abdominal aortic aneurysm repair  1999    STENT PLACED    . Cataract extraction w/ intraocular lens  implant, bilateral    . Appendectomy      1959  . Revision urostomy cutaneous    . Revision urostomy cutaneous  2006    Urostomy bag placed on right lower abdomen  . Vertebroplasty  08/22/2011    Procedure: VERTEBROPLASTY;  Surgeon: Winfield Cunas, MD;  Location: [facility];  Service: Neurosurgery;   Laterality: N/A;  Lumbar Two vertebroplasty  . Cholecystectomy      4 YRS AGO  . Posterior lumbar fusion  11/06/2012  . Coronary artery bypass graft  1999    CABG X2  . Skin cancer excision      "bottom lip" (11/06/2012)  . Bladder surgery      "for cancer" (11/06/2012)  . Lumbar laminectomy/decompression microdiscectomy Bilateral 11/06/2012    Procedure: LUMBAR LAMINECTOMY/DECOMPRESSION MICRODISCECTOMY;  Surgeon: Sinclair Ship, MD;  Location: [facility];  Service: Orthopedics;  Laterality: Bilateral;  Lumbar 2-5 decompression   HPI:  80 year old male referred for outpatient MBS due to pain with swallow following neck surgery in 2013. PMH significant for CVA, dementia, PNA, COPD, CABG, reflux. Barium swallow was completed July 2015, which revealed "moderate tertiary contractions in the mid and distal esophagus". No aspiration documented.     Assessment / Plan / Recommendation Clinical Impression  Dysphagia Diagnosis: Within Functional Limits Clinical impression: Minimal tongue base and vallecular residue noted, which cleared effectively with dry swallow. No penetration or aspiration seen on any  consistency tested.  No swelling noted at C4-6 where cervical hardware noted. Recommended beginning meals with warm liquid (broth, hot tea, etc) to facilitate esophageal clearing, limit bolus size and rate, and alternate solids and liquids.    Treatment Recommendation  No treatment recommended at this time    Diet Recommendation Regular;Thin liquid   Liquid Administration via: Cup;Straw Medication Administration: Whole meds with liquid Supervision: Patient able to self feed Compensations: Slow rate;Small sips/bites;Follow solids with liquid (begin meal with warm liquid) Postural Changes and/or Swallow Maneuvers: Upright 30-60 min after meal;Seated upright 90 degrees    Other  Recommendations Oral Care Recommendations: Oral care BID   Follow Up Recommendations  None    Frequency and Duration    n/a     Pertinent Vitals/Pain VSS, no pain reported    SLP Swallow Goals  n/a   General Date of Onset: 04/05/14 HPI: 80 year old male referred for outpatient MBS due to pain with swallow following neck surgery in 2013. PMH significant for CVA, dementia, PNA, COPD, CABG, reflux. Barium swallow was completed July 2015, which revealed "moderate tertiary contractions in the mid and distal esophagus". No aspiration documented. Type of Study: Modified Barium Swallowing Study Reason for Referral: Objectively evaluate swallowing function Previous Swallow Assessment: none Diet Prior to this Study: Regular;Thin liquids Temperature Spikes Noted: No Respiratory Status: Room air Behavior/Cognition: Alert;Cooperative;Pleasant mood Oral Cavity - Dentition: Adequate natural dentition Oral Motor / Sensory Function: Within functional limits Self-Feeding Abilities: Able to feed self Patient Positioning: Upright in chair Baseline Vocal Quality: Clear Volitional Cough: Strong Volitional Swallow: Able to elicit Anatomy: Within functional limits Pharyngeal Secretions: Not observed secondary MBS    Reason for Referral Objectively evaluate swallowing function   Oral Phase Oral Preparation/Oral Phase Oral Phase: WFL   Pharyngeal Phase Pharyngeal Phase Pharyngeal Phase: Impaired Pharyngeal - Thin Pharyngeal - Thin Cup: Delayed swallow initiation;Premature spillage to valleculae;Pharyngeal residue - valleculae;Compensatory strategies attempted (Comment) Pharyngeal - Thin Straw: Premature spillage to valleculae;Delayed swallow initiation;Pharyngeal residue - valleculae;Compensatory strategies attempted (Comment) Pharyngeal - Solids Pharyngeal - Puree: Delayed swallow initiation;Premature spillage to valleculae;Pharyngeal residue - valleculae;Compensatory strategies attempted (Comment) Pharyngeal - Multi-consistency: Delayed swallow initiation;Premature spillage to valleculae;Pharyngeal residue -  valleculae;Compensatory strategies attempted (Comment) Pharyngeal - Pill: Delayed swallow initiation;Premature spillage to valleculae;Pharyngeal residue - valleculae;Compensatory strategies attempted (Comment) Pharyngeal Phase - Comment Pharyngeal Comment: dry swallow effectively cleared trace tongue base and vallecular residue  Cervical Esophageal Phase    GO    Cervical Esophageal Phase Cervical Esophageal Phase: [facility]    Functional Assessment Tool Used: ASHA NOMS, clinical judgment Functional Limitations: Swallowing Swallow Current Status (U7253): 0 percent impaired, limited or restricted Swallow Goal Status (G6440): 0 percent impaired, limited or restricted Swallow Discharge Status (H4742): 0 percent impaired, limited or restricted   [facility], CCC-SLP [phone] Shonna Chock 04/05/2014, 11:41 AM

## 2014-04-07 ENCOUNTER — Ambulatory Visit (INDEPENDENT_AMBULATORY_CARE_PROVIDER_SITE_OTHER): Payer: Commercial Managed Care - HMO | Admitting: Cardiovascular Disease

## 2014-04-07 DIAGNOSIS — I480 Paroxysmal atrial fibrillation: Secondary | ICD-10-CM

## 2014-04-07 LAB — POCT INR: INR: 1.5

## 2014-04-21 ENCOUNTER — Ambulatory Visit (INDEPENDENT_AMBULATORY_CARE_PROVIDER_SITE_OTHER): Payer: Commercial Managed Care - HMO | Admitting: *Deleted

## 2014-04-21 DIAGNOSIS — I480 Paroxysmal atrial fibrillation: Secondary | ICD-10-CM

## 2014-04-21 LAB — POCT INR: INR: 2.7

## 2014-04-23 ENCOUNTER — Telehealth: Payer: Self-pay | Admitting: Cardiology

## 2014-04-23 NOTE — Telephone Encounter (Signed)
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  He states that Dr Felipa Eth is ordering a Carotid duplex on the pt and Gwenlyn Perking wants Dr Mare Ferrari to be aware.  The pt had his INR checked on 04/21/14 and it was within range at 2.7. He states he is faxing over his office notes for Dr Bobbye Riggs review.  Note forwarded to Dr Mare Ferrari and his nurse.

## 2014-04-23 NOTE — Telephone Encounter (Signed)
New message    Patient in the office today. 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  Please wanted MD to know.

## 2014-04-23 NOTE — Telephone Encounter (Signed)
Noted.  Await results of carotid duplex.

## 2014-05-03 ENCOUNTER — Other Ambulatory Visit: Payer: Self-pay | Admitting: Geriatric Medicine

## 2014-05-03 DIAGNOSIS — G459 Transient cerebral ischemic attack, unspecified: Secondary | ICD-10-CM

## 2014-05-03 DIAGNOSIS — I639 Cerebral infarction, unspecified: Secondary | ICD-10-CM

## 2014-05-05 ENCOUNTER — Ambulatory Visit (INDEPENDENT_AMBULATORY_CARE_PROVIDER_SITE_OTHER): Payer: Commercial Managed Care - HMO | Admitting: Interventional Cardiology

## 2014-05-05 DIAGNOSIS — I480 Paroxysmal atrial fibrillation: Secondary | ICD-10-CM

## 2014-05-05 LAB — POCT INR: INR: 2.8

## 2014-05-10 ENCOUNTER — Ambulatory Visit
Admission: RE | Admit: 2014-05-10 | Discharge: 2014-05-10 | Disposition: A | Discharge status: Home / Self Care | Payer: Commercial Managed Care - HMO | Source: Ambulatory Visit | Attending: Geriatric Medicine | Admitting: Geriatric Medicine

## 2014-05-10 DIAGNOSIS — I639 Cerebral infarction, unspecified: Secondary | ICD-10-CM

## 2014-05-10 DIAGNOSIS — G459 Transient cerebral ischemic attack, unspecified: Secondary | ICD-10-CM

## 2014-05-12 ENCOUNTER — Telehealth: Payer: Self-pay | Admitting: Emergency Medicine

## 2014-05-12 ENCOUNTER — Encounter: Payer: Self-pay | Admitting: Cardiology

## 2014-05-12 NOTE — Telephone Encounter (Signed)
Called and spoke with pt and he is doing well and is fine with the appt for feb. With RB.  appt for 2-9 at 10.  Pt is aware.

## 2014-05-12 NOTE — Telephone Encounter (Signed)
As long as he is doing well, it sounds like we can wait until the February schedule comes out. Is this OK with him?

## 2014-05-12 NOTE — Telephone Encounter (Signed)
Spoke with pt, states that he has not seen RB since he started him on 02 back in August and his pcp has recommended that he follow up with RB to make sure he is doing well.  I offered appt with TP next available to pt, he denied and states he only wants to see RB.  RB has no openings in any of the days he is in clinic in December or January.    Dr. Lamonte Sakai, are you ok with double booking this patient somewhere for a rov?  If so, where should I offer?  Thanks!

## 2014-05-26 ENCOUNTER — Ambulatory Visit (INDEPENDENT_AMBULATORY_CARE_PROVIDER_SITE_OTHER): Payer: Commercial Managed Care - HMO | Admitting: Internal Medicine

## 2014-05-26 DIAGNOSIS — I480 Paroxysmal atrial fibrillation: Secondary | ICD-10-CM

## 2014-05-26 LAB — POCT INR: INR: 2.4

## 2014-06-08 ENCOUNTER — Other Ambulatory Visit: Payer: Self-pay | Admitting: *Deleted

## 2014-06-08 DIAGNOSIS — G4700 Insomnia, unspecified: Secondary | ICD-10-CM

## 2014-06-09 ENCOUNTER — Other Ambulatory Visit: Payer: Self-pay | Admitting: *Deleted

## 2014-06-09 DIAGNOSIS — G4700 Insomnia, unspecified: Secondary | ICD-10-CM

## 2014-06-09 MED ORDER — ZOLPIDEM TARTRATE 5 MG PO TABS: 5.0000 mg | ORAL_TABLET | Freq: Every evening | ORAL | Status: DC | PRN

## 2014-06-23 ENCOUNTER — Ambulatory Visit (INDEPENDENT_AMBULATORY_CARE_PROVIDER_SITE_OTHER): Payer: Commercial Managed Care - HMO | Admitting: Pharmacist

## 2014-06-23 DIAGNOSIS — I480 Paroxysmal atrial fibrillation: Secondary | ICD-10-CM

## 2014-06-23 LAB — POCT INR: INR: 2.6

## 2014-06-30 ENCOUNTER — Ambulatory Visit (INDEPENDENT_AMBULATORY_CARE_PROVIDER_SITE_OTHER): Payer: Commercial Managed Care - HMO | Admitting: Cardiology

## 2014-06-30 ENCOUNTER — Encounter: Payer: Self-pay | Admitting: Cardiology

## 2014-06-30 VITALS — BP 80/40 | HR 69 | Ht 68.0 in | Wt 171.0 lb

## 2014-06-30 DIAGNOSIS — I959 Hypotension, unspecified: Secondary | ICD-10-CM | POA: Insufficient documentation

## 2014-06-30 DIAGNOSIS — I480 Paroxysmal atrial fibrillation: Secondary | ICD-10-CM

## 2014-06-30 DIAGNOSIS — I952 Hypotension due to drugs: Secondary | ICD-10-CM

## 2014-06-30 DIAGNOSIS — R319 Hematuria, unspecified: Secondary | ICD-10-CM

## 2014-06-30 DIAGNOSIS — R0609 Other forms of dyspnea: Secondary | ICD-10-CM

## 2014-06-30 NOTE — Progress Notes (Signed)
Cardiology Office Note   Date:  06/30/2014   ID:  Peter Richards, DOB 10-14-1928, MRN [id number]  PCP:  Mathews Argyle, MD  Cardiologist:   Darlin Coco, MD   No chief complaint on file.     History of Present Illness: Peter Richards is a 81 y.o. male who presents for scheduled follow-up office visit  This pleasant 81 year old gentleman is seen for a scheduled four-month follow-up office visit.  He has a history of CABG in 2002. He has a history of tachybradycardia syndrome and paroxysmal atrial fibrillation. He has a dual-chamber pacemaker. His pacemaker is followed by Dr. Lovena Le. . He has a history of hypercholesterolemia.  His lipids are followed by Dr. Felipa Eth. The patient is on long-term Coumadin anticoagulation because of his paroxysmal A. fib. He is maintaining normal sinus rhythm. The patient has been off amiodarone since last year because of question of pulmonary fibrosis. A recent chest x-ray in September 2015 showed no change in the amount of fibrosis from last year.  The patient had an echocardiogram 04/11/12 showed an ejection fraction of 55-65% with grade 1 diastolic dysfunction. His last nuclear stress test was an adenosine study on 04/21/08 which showed an ejection fraction of 61% and no ischemia.  He has a history of back pain and is followed by Dr. Lynann Bologna and underwent successful back surgery with insertion of rods. It has relieved his low back pain. The patient has been having some pain in his legs. He saw Dr. Trula Slade for a check of his circulation. Dr. Trula Slade found that his leg circulation was fine. The patient has not been having any recurrent chest discomfort.  He has developed gross hematuria being evaluated by Dr. Risa Grill.  He has been feeling more tired and run down.  His blood pressure has been running lower than usual.  Past Medical History  Diagnosis Date  . Hyperlipidemia   . Orthostatic hypotension   . Bradycardia   . Syncope    s/p PTVP  . Myocardial infarction     1981  . CAD (coronary artery disease)     prior CABG in 2002 negative Myoview in 2009  . Hypertension   . Stroke     TIA  2007    . GERD (gastroesophageal reflux disease)   . Headache(784.0)   . Arthritis     OSTEOPOROSIS  . Aortic stenosis, mild     by 01/08/08 echo  . Chronic anticoagulation   . Depression   . AAA (abdominal aortic aneurysm)   . Heart attack   . Pacemaker     medtronic pacer  . History of cervical spinal surgery     [place]  [name] HOH (hard of hearing)     BILATERAL HEARING AIDS  . Prostate cancer   . Basal cell adenocarcinoma     'bottom lip" (11/06/2012)  . Bladder cancer   . Attention to urostomy     "since 2006" (11/06/2012)  . Atrial fibrillation     on amiodarone and has PTVP in place  . Exertional shortness of breath   . Chronic radicular pain of lower back   . Anxiety   . COPD (chronic obstructive pulmonary disease)     Past Surgical History  Procedure Laterality Date  . Insert / replace / remove pacemaker      11/2009  DR BRODIE    . Prostatectomy      BLADDER ALSO REMOVED (OSTOMY BAG)  2006  FOR CANCER     .  Abdominal aortic aneurysm repair  1999    STENT PLACED    . Cataract extraction w/ intraocular lens  implant, bilateral    . Appendectomy      1959  . Revision urostomy cutaneous    . Revision urostomy cutaneous  2006    Urostomy bag placed on right lower abdomen  . Vertebroplasty  08/22/2011    Procedure: VERTEBROPLASTY;  Surgeon: Winfield Cunas, MD;  Location: [facility];  Service: Neurosurgery;  Laterality: N/A;  Lumbar Two vertebroplasty  . Cholecystectomy      4 YRS AGO  . Posterior lumbar fusion  11/06/2012  . Coronary artery bypass graft  1999    CABG X2  . Skin cancer excision      "bottom lip" (11/06/2012)  . Bladder surgery      "for cancer" (11/06/2012)  . Lumbar laminectomy/decompression microdiscectomy Bilateral 11/06/2012    Procedure: LUMBAR LAMINECTOMY/DECOMPRESSION  MICRODISCECTOMY;  Surgeon: Sinclair Ship, MD;  Location: [facility];  Service: Orthopedics;  Laterality: Bilateral;  Lumbar 2-5 decompression     Current Outpatient Prescriptions  Medication Sig Dispense Refill  . Acetaminophen 500 MG coapsule 500 mg every 6 (six) hours as needed for pain.     [name] atorvastatin (LIPITOR) 10 MG tablet Take 1 tablet by mouth every evening.    . Calcium 500 MG CHEW     . escitalopram (LEXAPRO) 20 MG tablet Take 20 mg by mouth daily.    . fentaNYL (DURAGESIC - DOSED MCG/HR) 25 MCG/HR patch Place 1 patch (25 mcg total) onto the skin every 3 (three) days. 5 patch 0  . HYDROcodone-acetaminophen (NORCO/VICODIN) 5-325 MG per tablet Take 1 tablet by mouth every 4 (four) hours as needed for severe pain.     . metoprolol succinate (TOPROL-XL) 25 MG 24 hr tablet Take 25 mg by mouth as directed. 1/2 tablet daily    . Multiple Vitamins-Minerals (EYE VITAMINS PO)     . nitroGLYCERIN (NITROSTAT) 0.4 MG SL tablet Place 0.4 mg under the tongue every 5 (five) minutes as needed for chest pain (MAX 3 TABLETS).    [name] PRESCRIPTION MEDICATION Wears 2 L of O2 at night    . tiotropium (SPIRIVA) 18 MCG inhalation capsule Place 1 capsule (18 mcg total) into inhaler and inhale daily as needed (for shortness of breath). 90 capsule 3  . tiZANidine (ZANAFLEX) 4 MG tablet Take 4 mg by mouth every 6 (six) hours as needed for muscle spasms. Take two each day.    . VOLTAREN 1 % GEL Apply 2 g topically daily.     [name] warfarin (COUMADIN) 5 MG tablet Take 2.5-5 mg by mouth daily. Take 2.5 mg by mouth on Tuesday, and Saturday.  Take 5 mg on all other days.    [name] zolpidem (AMBIEN) 5 MG tablet Take 1 tablet (5 mg total) by mouth at bedtime as needed for sleep. 30 tablet 5   No current facility-administered medications for this visit.    Allergies:   Rosuvastatin; Simvastatin; and Codeine    Social History:  The patient  reports that he quit smoking about 36 years ago. His smoking use included  Cigarettes. He has a 150 pack-year smoking history. His smokeless tobacco use includes Snuff. He reports that he does not drink alcohol or use illicit drugs.   Family History:  The patient's family history includes Cancer in his father and sister; Heart disease in his mother and sister; Hypertension in his mother and sister.  ROS:  Please see the history of present illness.   Otherwise, review of systems are positive for none.   All other systems are reviewed and negative.    PHYSICAL EXAM: VS:  BP 80/40 mmHg  Pulse 69  Ht 5\' 8"  (1.727 m)  Wt 171 lb (77.565 kg)  BMI 26.01 kg/m2  SpO2 95% , BMI Body mass index is 26.01 kg/(m^2). GEN: Well nourished, well developed, in no acute distress HEENT: normal Neck: no JVD, carotid bruits, or masses Cardiac: RRR; no murmurs, rubs, or gallops,no edema.  rhythm is atrial paced Respiratory:  clear to auscultation bilaterally, normal work of breathing.  He has prominent bilateral coarse pulmonary rales consistent with his history of pulmonary fibrosis.  He denies orthopnea. GI: soft, nontender, nondistended, + BS.  there is an urostomy bag in left lower quadrant. MS: no deformity or atrophy Skin: warm and dry, no rash Neuro:  Strength and sensation are intact Psych: euthymic mood, full affect   EKG:  EKG is ordered today. The ekg ordered today demonstrates atrial paced rhythm.  No ischemic changes.  Since last tracing of 02/26/14, atrial pacer is seen.   Recent Labs: 10/02/2013: ALT 10 02/26/2014: BUN 27*; Creatinine 0.85; Hemoglobin 11.8*; Platelets 208; Potassium 4.5; Sodium 141    Lipid Panel    Component Value Date/Time   CHOL 128 05/06/2012 1505   TRIG 210.0* 05/06/2012 1505   HDL 37.30* 05/06/2012 1505   CHOLHDL 3 05/06/2012 1505   VLDL 42.0* 05/06/2012 1505   LDLCALC  11/12/2009 0100    26        Total Cholesterol/HDL:CHD Risk Coronary Heart Disease Risk Table                     Men   Women  1/2 Average Risk   3.4   3.3   Average Risk       5.0   4.4  2 X Average Risk   9.6   7.1  3 X Average Risk  23.4   11.0        Use the calculated Patient Ratio above and the CHD Risk Table to determine the patient's CHD Risk.        ATP III CLASSIFICATION (LDL):  <100     mg/dL   Optimal  100-129  mg/dL   Near or Above                    Optimal  130-159  mg/dL   Borderline  160-189  mg/dL   High  >190     mg/dL   Very High   LDLDIRECT 64.4 05/06/2012 1505      Wt Readings from Last 3 Encounters:  06/30/14 171 lb (77.565 kg)  03/30/14 173 lb (78.472 kg)  03/02/14 172 lb (78.019 kg)      Other studies Reviewed: Additional studies/ records that were reviewed today include: Chest x-ray from September 2015 Review of the above records demonstrates: Changes of stable pulmonary fibrosis   ASSESSMENT AND PLAN:  1. Paroxysmal atrial fibrillation 2. ischemic heart disease status post remote CABG (old records suggest 2002 and also mention 1999). Lexiscan Myoview stress test on 03/03/14 was satisfactory with a fixed anterior defect and no reversible ischemia and ejection fraction 48%. 3. status post abdominal aortic aneurysm repair 1999 4. permanent pacemaker implantation in June 2011 for syncope by Dr. Olevia Perches. 5. status post cholecystectomy 6. Hypercholesterolemia followed by Dr. Felipa Eth. 7. Insomnia 8. status post  left carotid endarterectomy 9. mild aortic stenosis 10. Status post bladder cancer with subsequent urostomy 11.  Recent hematuria, being evaluated by Dr. Risa Grill. 12.  Low blood pressure  Current medicines are reviewed at length with the patient today.  The patient does not have concerns regarding medicines.  The following changes have been made:  Stop isosorbide mononitrate.  Decrease Toprol to 12.5 mg daily.  Increase salt intake and fluid intake to help raise blood pressure.  Labs/ tests ordered today include:   Orders Placed This Encounter  Procedures  . EKG 12-Lead      Disposition:   FU with [name] in 4 months for follow-up office visit.  He will be getting a CT scan of his kidneys next week ordered by Dr. Risa Grill his urologist.   Signed, Darlin Coco, MD  06/30/2014 12:57 PM    [address], [place], [place]  [address] Phone: [phone]; Fax: [phone]

## 2014-06-30 NOTE — Patient Instructions (Signed)
STOP ISOSORBIDE  DECREASE YOUR TOPROL (METOPROLOL) TO 25 MG 1/2 TABLET   INCREASE YOUR SALT AND WATER INTAKE  Your physician recommends that you schedule a follow-up appointment in: [place]

## 2014-07-01 ENCOUNTER — Ambulatory Visit (INDEPENDENT_AMBULATORY_CARE_PROVIDER_SITE_OTHER): Payer: Commercial Managed Care - HMO | Admitting: *Deleted

## 2014-07-01 DIAGNOSIS — I480 Paroxysmal atrial fibrillation: Secondary | ICD-10-CM

## 2014-07-01 NOTE — Progress Notes (Signed)
Remote pacemaker transmission.   

## 2014-07-02 LAB — MDC_IDC_ENUM_SESS_TYPE_REMOTE
Battery Impedance: 155 Ohm
Battery Remaining Longevity: 129 mo
Brady Statistic AP VP Percent: 0 %
Brady Statistic AS VP Percent: 0 %
Date Time Interrogation Session: 20160128142916
Lead Channel Impedance Value: 631 Ohm
Lead Channel Setting Pacing Amplitude: 2.5 V
Lead Channel Setting Sensing Sensitivity: 4 mV
MDC IDC MSMT BATTERY VOLTAGE: 2.79 V
MDC IDC MSMT LEADCHNL RA IMPEDANCE VALUE: 491 Ohm
MDC IDC MSMT LEADCHNL RA PACING THRESHOLD AMPLITUDE: 0.5 V
MDC IDC MSMT LEADCHNL RA PACING THRESHOLD PULSEWIDTH: 0.4 ms
MDC IDC MSMT LEADCHNL RV SENSING INTR AMPL: 8 mV
MDC IDC SET LEADCHNL RA PACING AMPLITUDE: 2 V
MDC IDC SET LEADCHNL RV PACING PULSEWIDTH: 1 ms
MDC IDC STAT BRADY AP VS PERCENT: 87 %
MDC IDC STAT BRADY AS VS PERCENT: 13 %

## 2014-07-07 ENCOUNTER — Encounter: Payer: Self-pay | Admitting: Cardiology

## 2014-07-13 ENCOUNTER — Ambulatory Visit (INDEPENDENT_AMBULATORY_CARE_PROVIDER_SITE_OTHER): Payer: Commercial Managed Care - HMO | Admitting: Emergency Medicine

## 2014-07-13 ENCOUNTER — Encounter: Payer: Self-pay | Admitting: Emergency Medicine

## 2014-07-13 VITALS — BP 110/78 | HR 82 | Temp 98.1°F | Ht 68.0 in | Wt 167.0 lb

## 2014-07-13 DIAGNOSIS — J42 Unspecified chronic bronchitis: Secondary | ICD-10-CM

## 2014-07-13 DIAGNOSIS — J849 Interstitial pulmonary disease, unspecified: Secondary | ICD-10-CM

## 2014-07-13 NOTE — Assessment & Plan Note (Signed)
Please start taking your Spiriva once a day, every day at the same time. Do not skip days if possible You may use your pro-air 2 puffs as needed for shortness of breath You would likely benefit from wearing oxygen 2 L/m whenever you are up exerting yourself Wear your oxygen every night when sleeping Follow with Dr Lamonte Sakai in 6 months or sooner if you have any problems

## 2014-07-13 NOTE — Assessment & Plan Note (Signed)
Will recheck his CT scan of the chest in summer 2016

## 2014-07-13 NOTE — Progress Notes (Signed)
Subjective:    Patient ID: Peter Richards, male    DOB: 07/26/1928, 81 y.o.   MRN: [id number]  HPI 81 yo man, former smoker (150 pk-yrs), hx CAD/CABG ('02), A Fib on amiodarone for the last 4 years, was stopped 1 month ago, followed by Dr Mare Ferrari. He has been experiencing progressive exertional SOB for over 3 months such that he had to stop to rest with walking about 75 feet. He has also had some exertional CP that has responded to NTG, but the episodes of dyspnea haven't always occurred at same time as the CP. He has a cough, bothering him for about a year. He hears wheezing at times, seems to come at random, not necessarily with exertion.  Hurts sometimes to take a deep breath. Denies any sx or RA, SLE, etc.   ROV 06/10/12 -- follows up for dyspnea, presumed to be multifactorial. He has ILD on CT scan chest as well as obstruction on PFT. We started Spiriva last time to see if he would benefit >> he believes that he is less SOB. He hasn't exerted significantly, but his usual daily activities are easier. His wheeze is better. Last time he desaturated with ambulation, but wanted to defer. He has intermittent aspiration sx >> swallow precautions recommended 04/2011: 1. Consider esophageal assessment 2. Regular textures & Thin liquids 3. Compensations: Hard cough after swallow 4. Upright 30-60 min after meal;Seated upright 90 degrees  ROV 12/11/13 -- follow up visit for mixed disease > COPD + ILD on CT scan, suspected related to aspiration and silica exposure. He has been experiencing more dyspnea over the last 2-3 months. He has a new persistent cough associated with mid-chest discomfort. This is mid-sternal, intermittent. Has been associated with exertion, with his cough. No change with meals. He sees Dr Mare Ferrari for A fib / pacer, but has not talked to him about the CP. Pt has only taking Spiriva prn, about every week.  >stop Spiriva  Follow up 01/04/14 --  Pt was seen last month for follow up for  COPD and ILD(suspected aspiration and silica exposure)  Repeat CT chest done on 7/22 showed progressed ILD changes .  We reveiwed these results in detail and questions were answered .  Previous PFT showed mixed disease in 2013.  Says he gets winded with walking and has to stop and rest.  Today in office ambulatory walk showed drop in sats 85% on RA. At rest 92% on RA.  We discussed his option and he agrees to begin O2 with activity and. At bedtime .  He remains on Spiriva daily .  No hemoptysis ,orthopnea, exertional chest pain , edema or fever. No flare of cough.   ROV 02/03/14 -- follow up for ILD and COPD. PFT done 9/2 > mixed disease, FEV1 67% pred. We had attempted stopping Spiriva at one point. He hasn't seen Dr Mare Ferrari yet about his CP > happens with exertion, whenever he is dyspneic. He started O2 last month > believes it has helped his dyspnea and CP.  He stopped taking the spiriva last month when he started the oxygen.  He uses prn and rarely.  He was on nexium for a week for cough.> stopped it, wonders about restarting.   ROV 07/13/14 -- follows for his hx ILD and COPD. Last time we added back Spiriva once a day, but he is only using occasionally prn.  Was concerned that he was having exertional chest pain. He has followed with [name] and a  stress test showed no reversible ischemia in 03/03/14. He continues to have CP with a deep breath, not necessarily exertion. He is using O2 at night, but using during the day prn.      Objective:   Physical Exam Filed Vitals:   07/13/14 0958  BP: 110/78  Pulse: 82  Temp: 98.1 F (36.7 C)  TempSrc: Oral  Height: 5\' 8"  (1.727 m)  Weight: 167 lb (75.751 kg)  SpO2: 96%    Gen: Pleasant, well-nourished, in no distress,  normal affect  ENT: No lesions,  mouth clear,  oropharynx clear, no postnasal drip  Neck: No JVD, no TMG, no carotid bruits  Lungs: No use of accessory muscles, distant, B insp crackles  Cardiovascular: RRR, heart  sounds normal, no murmur or gallops, no peripheral edema  Musculoskeletal: No deformities, no cyanosis or clubbing,   Neuro: alert, non focal  Skin: Warm, no lesions or rashes   Data:  PFT were performed  04/09/12>> show mixed disease, no BD response, restricted volumes, decreased DLCO that corrects for Va Repeated 02/03/14 >> mixed disease, restricted volumes, decreased DLCO   CT chest 12/23/13 >Mediastinal lymph nodes measure up to 1 mm in AP window. There is a  larger lymph node in the lower right paratracheal station which is  predominantly made up of a fatty hilum. Hilar regions are difficult  to definitively evaluate without IV contrast. No axillary  adenopathy. Atherosclerotic calcification of the arterial  vasculature. Heart is at the upper limits of normal in size. No  pericardial effusion.  There is a peripheral and basilar predominant pattern of subpleural  reticulation, traction bronchiectasis/bronchiolectasis and mild  associated architectural distortion. There may be honeycombing in  the lower lobes. Findings have progressed in the lung bases when  compared with 04/11/2012. No air trapping. No pleural fluid. Debris  is seen dependently in the airway.  Incidental imaging of the upper abdomen shows the visualized  portions of the liver, adrenal glands, kidneys, spleen, pancreas and  stomach to be grossly unremarkable. No upper abdominal adenopathy.  No worrisome lytic or sclerotic lesions. Degenerative changes are  seen in the spine.  IMPRESSION:  Peripheral and basilar predominant pattern of subpleural  reticulation, traction bronchiectasis/ bronchiolectasis and  associated architectural distortion. Findings have progressed from  04/11/2012 and indicative of usual interstitial pneumonitis (UIP).      Assessment & Plan:  COPD (chronic obstructive pulmonary disease) Please start taking your Spiriva once a day, every day at the same time. Do not skip days if  possible You may use your pro-air 2 puffs as needed for shortness of breath You would likely benefit from wearing oxygen 2 L/m whenever you are up exerting yourself Wear your oxygen every night when sleeping Follow with Dr Lamonte Sakai in 6 months or sooner if you have any problems   Interstitial lung disease Will recheck his CT scan of the chest in summer 2016

## 2014-07-13 NOTE — Patient Instructions (Signed)
Please start taking your Spiriva once a day, every day at the same time. Do not skip days if possible You may use your pro-air 2 puffs as needed for shortness of breath You would likely benefit from wearing oxygen 2 L/m whenever you are up exerting yourself Wear your oxygen every night when sleeping We will need to repeat your CT scan this Summer.  Follow with Dr Lamonte Sakai in 6 months or sooner if you have any problems

## 2014-08-02 ENCOUNTER — Other Ambulatory Visit: Payer: Self-pay | Admitting: Cardiology

## 2014-08-04 ENCOUNTER — Ambulatory Visit (INDEPENDENT_AMBULATORY_CARE_PROVIDER_SITE_OTHER): Payer: Commercial Managed Care - HMO | Admitting: Cardiology

## 2014-08-04 DIAGNOSIS — I480 Paroxysmal atrial fibrillation: Secondary | ICD-10-CM

## 2014-08-04 LAB — POCT INR: INR: 2.3

## 2014-08-20 ENCOUNTER — Inpatient Hospital Stay (HOSPITAL_COMMUNITY)
Admission: EM | Admit: 2014-08-20 | Discharge: 2014-08-24 | DRG: 871 | Disposition: A | Discharge status: Home Health | Payer: Commercial Managed Care - HMO | Attending: Internal Medicine | Admitting: Internal Medicine

## 2014-08-20 ENCOUNTER — Emergency Department (HOSPITAL_COMMUNITY): Payer: Commercial Managed Care - HMO

## 2014-08-20 ENCOUNTER — Encounter (HOSPITAL_COMMUNITY): Payer: Self-pay | Admitting: *Deleted

## 2014-08-20 DIAGNOSIS — E785 Hyperlipidemia, unspecified: Secondary | ICD-10-CM | POA: Diagnosis present

## 2014-08-20 DIAGNOSIS — F419 Anxiety disorder, unspecified: Secondary | ICD-10-CM | POA: Diagnosis present

## 2014-08-20 DIAGNOSIS — I482 Chronic atrial fibrillation: Secondary | ICD-10-CM | POA: Diagnosis present

## 2014-08-20 DIAGNOSIS — Z9981 Dependence on supplemental oxygen: Secondary | ICD-10-CM

## 2014-08-20 DIAGNOSIS — F329 Major depressive disorder, single episode, unspecified: Secondary | ICD-10-CM | POA: Diagnosis present

## 2014-08-20 DIAGNOSIS — J181 Lobar pneumonia, unspecified organism: Secondary | ICD-10-CM | POA: Diagnosis present

## 2014-08-20 DIAGNOSIS — E872 Acidosis: Secondary | ICD-10-CM | POA: Diagnosis present

## 2014-08-20 DIAGNOSIS — Z87891 Personal history of nicotine dependence: Secondary | ICD-10-CM

## 2014-08-20 DIAGNOSIS — R0600 Dyspnea, unspecified: Secondary | ICD-10-CM

## 2014-08-20 DIAGNOSIS — I10 Essential (primary) hypertension: Secondary | ICD-10-CM | POA: Diagnosis present

## 2014-08-20 DIAGNOSIS — Z961 Presence of intraocular lens: Secondary | ICD-10-CM | POA: Diagnosis present

## 2014-08-20 DIAGNOSIS — Z950 Presence of cardiac pacemaker: Secondary | ICD-10-CM | POA: Diagnosis not present

## 2014-08-20 DIAGNOSIS — K219 Gastro-esophageal reflux disease without esophagitis: Secondary | ICD-10-CM | POA: Diagnosis present

## 2014-08-20 DIAGNOSIS — Z951 Presence of aortocoronary bypass graft: Secondary | ICD-10-CM

## 2014-08-20 DIAGNOSIS — J9621 Acute and chronic respiratory failure with hypoxia: Secondary | ICD-10-CM | POA: Diagnosis present

## 2014-08-20 DIAGNOSIS — Z8551 Personal history of malignant neoplasm of bladder: Secondary | ICD-10-CM | POA: Diagnosis not present

## 2014-08-20 DIAGNOSIS — Z8546 Personal history of malignant neoplasm of prostate: Secondary | ICD-10-CM

## 2014-08-20 DIAGNOSIS — I2510 Atherosclerotic heart disease of native coronary artery without angina pectoris: Secondary | ICD-10-CM | POA: Diagnosis present

## 2014-08-20 DIAGNOSIS — Z8673 Personal history of transient ischemic attack (TIA), and cerebral infarction without residual deficits: Secondary | ICD-10-CM | POA: Diagnosis not present

## 2014-08-20 DIAGNOSIS — J849 Interstitial pulmonary disease, unspecified: Secondary | ICD-10-CM | POA: Diagnosis present

## 2014-08-20 DIAGNOSIS — H9190 Unspecified hearing loss, unspecified ear: Secondary | ICD-10-CM | POA: Diagnosis present

## 2014-08-20 DIAGNOSIS — J441 Chronic obstructive pulmonary disease with (acute) exacerbation: Secondary | ICD-10-CM | POA: Diagnosis present

## 2014-08-20 DIAGNOSIS — I5032 Chronic diastolic (congestive) heart failure: Secondary | ICD-10-CM | POA: Diagnosis present

## 2014-08-20 DIAGNOSIS — A419 Sepsis, unspecified organism: Secondary | ICD-10-CM | POA: Diagnosis not present

## 2014-08-20 DIAGNOSIS — M1990 Unspecified osteoarthritis, unspecified site: Secondary | ICD-10-CM | POA: Diagnosis present

## 2014-08-20 DIAGNOSIS — Z9842 Cataract extraction status, left eye: Secondary | ICD-10-CM

## 2014-08-20 DIAGNOSIS — G8929 Other chronic pain: Secondary | ICD-10-CM | POA: Diagnosis present

## 2014-08-20 DIAGNOSIS — I252 Old myocardial infarction: Secondary | ICD-10-CM

## 2014-08-20 DIAGNOSIS — J449 Chronic obstructive pulmonary disease, unspecified: Secondary | ICD-10-CM | POA: Diagnosis present

## 2014-08-20 DIAGNOSIS — Z79899 Other long term (current) drug therapy: Secondary | ICD-10-CM

## 2014-08-20 DIAGNOSIS — J189 Pneumonia, unspecified organism: Secondary | ICD-10-CM | POA: Diagnosis not present

## 2014-08-20 DIAGNOSIS — Z85828 Personal history of other malignant neoplasm of skin: Secondary | ICD-10-CM

## 2014-08-20 DIAGNOSIS — Z7901 Long term (current) use of anticoagulants: Secondary | ICD-10-CM

## 2014-08-20 DIAGNOSIS — I4891 Unspecified atrial fibrillation: Secondary | ICD-10-CM | POA: Diagnosis present

## 2014-08-20 DIAGNOSIS — Z9841 Cataract extraction status, right eye: Secondary | ICD-10-CM

## 2014-08-20 DIAGNOSIS — R0602 Shortness of breath: Secondary | ICD-10-CM | POA: Diagnosis not present

## 2014-08-20 LAB — I-STAT VENOUS BLOOD GAS, ED
Acid-base deficit: 1 mmol/L (ref 0.0–2.0)
BICARBONATE: 26.3 meq/L — AB (ref 20.0–24.0)
O2 SAT: 56 %
TCO2: 28 mmol/L (ref 0–100)
pCO2, Ven: 51.5 mmHg — ABNORMAL HIGH (ref 45.0–50.0)
pH, Ven: 7.315 — ABNORMAL HIGH (ref 7.250–7.300)
pO2, Ven: 32 mmHg (ref 30.0–45.0)

## 2014-08-20 LAB — BASIC METABOLIC PANEL
Anion gap: 11 (ref 5–15)
BUN: 23 mg/dL (ref 6–23)
CHLORIDE: 103 mmol/L (ref 96–112)
CO2: 25 mmol/L (ref 19–32)
Calcium: 9.3 mg/dL (ref 8.4–10.5)
Creatinine, Ser: 1.25 mg/dL (ref 0.50–1.35)
GFR, EST AFRICAN AMERICAN: 59 mL/min — AB (ref >90)
GFR, EST NON AFRICAN AMERICAN: 51 mL/min — AB (ref >90)
Glucose, Bld: 138 mg/dL — ABNORMAL HIGH (ref 70–99)
POTASSIUM: 4.1 mmol/L (ref 3.5–5.1)
SODIUM: 139 mmol/L (ref 135–145)

## 2014-08-20 LAB — I-STAT CG4 LACTIC ACID, ED: Lactic Acid, Venous: 3.26 mmol/L (ref 0.5–2.0)

## 2014-08-20 LAB — CBC
HEMATOCRIT: 39.7 % (ref 39.0–52.0)
Hemoglobin: 13 g/dL (ref 13.0–17.0)
MCH: 30.3 pg (ref 26.0–34.0)
MCHC: 32.7 g/dL (ref 30.0–36.0)
MCV: 92.5 fL (ref 78.0–100.0)
PLATELETS: 235 10*3/uL (ref 150–400)
RBC: 4.29 MIL/uL (ref 4.22–5.81)
RDW: 14.7 % (ref 11.5–15.5)
WBC: 21.6 10*3/uL — AB (ref 4.0–10.5)

## 2014-08-20 LAB — BRAIN NATRIURETIC PEPTIDE: B Natriuretic Peptide: 49.4 pg/mL (ref 0.0–100.0)

## 2014-08-20 LAB — I-STAT TROPONIN, ED: Troponin i, poc: 0 ng/mL (ref 0.00–0.08)

## 2014-08-20 MED ORDER — SODIUM CHLORIDE 0.9 % IV BOLUS (SEPSIS)
1000.0000 mL | Freq: Once | INTRAVENOUS | Status: AC
  Administered 2014-08-21: 1000 mL via INTRAVENOUS

## 2014-08-20 MED ORDER — DEXTROSE 5 % IV SOLN
1.0000 g | Freq: Once | INTRAVENOUS | Status: AC
  Administered 2014-08-20: 1 g via INTRAVENOUS
  Filled 2014-08-20: qty 10

## 2014-08-20 MED ORDER — IPRATROPIUM BROMIDE 0.02 % IN SOLN
0.5000 mg | Freq: Once | RESPIRATORY_TRACT | Status: AC
  Administered 2014-08-20: 0.5 mg via RESPIRATORY_TRACT
  Filled 2014-08-20: qty 2.5

## 2014-08-20 MED ORDER — DEXTROSE 5 % IV SOLN
500.0000 mg | Freq: Once | INTRAVENOUS | Status: AC
  Administered 2014-08-20: 500 mg via INTRAVENOUS
  Filled 2014-08-20: qty 500

## 2014-08-20 MED ORDER — ALBUTEROL SULFATE (2.5 MG/3ML) 0.083% IN NEBU
5.0000 mg | INHALATION_SOLUTION | Freq: Once | RESPIRATORY_TRACT | Status: AC
  Administered 2014-08-20: 5 mg via RESPIRATORY_TRACT
  Filled 2014-08-20: qty 6

## 2014-08-20 NOTE — ED Notes (Signed)
Pt to ED from home via [facility] c/o increased shortness of breath. Hx of COPD, pt is on home oxygen. Pt did not realize his portable oxygen tank was empty at home when he became more short of breath. Oxygen sats on 2L 88%-92%. EMS gave 10mg  Albuterol, 0.5mg  atrovent, 125mg  Solumedrol enroute. SOB worse on exertion.

## 2014-08-20 NOTE — ED Notes (Signed)
Pt c/o increased shortness of breath over the last 6 months, worsening today after realizing his oxygen tank was empty for an unknown amount of time. Also c/o generalized chest tightness, worsening on inspiration. Hx of COPD.  Urostomy bag emptied on arrival.

## 2014-08-20 NOTE — ED Notes (Signed)
Dr Sharol Given given a copy for Dr Ashok Cordia of lactic acid results and venous blood gas

## 2014-08-20 NOTE — ED Notes (Signed)
Pt assisted to side of bed for comfort. Pt able to sit on the side without difficulty with RN at bedside. Assisted back to bed

## 2014-08-20 NOTE — ED Notes (Signed)
Bi-pap placed by RT; pt tolerating mask at this time

## 2014-08-21 DIAGNOSIS — J9621 Acute and chronic respiratory failure with hypoxia: Secondary | ICD-10-CM

## 2014-08-21 DIAGNOSIS — J849 Interstitial pulmonary disease, unspecified: Secondary | ICD-10-CM

## 2014-08-21 DIAGNOSIS — Z950 Presence of cardiac pacemaker: Secondary | ICD-10-CM

## 2014-08-21 DIAGNOSIS — I482 Chronic atrial fibrillation: Secondary | ICD-10-CM

## 2014-08-21 DIAGNOSIS — J189 Pneumonia, unspecified organism: Secondary | ICD-10-CM

## 2014-08-21 DIAGNOSIS — J42 Unspecified chronic bronchitis: Secondary | ICD-10-CM

## 2014-08-21 DIAGNOSIS — Z8551 Personal history of malignant neoplasm of bladder: Secondary | ICD-10-CM

## 2014-08-21 LAB — PROTIME-INR
INR: 2 — ABNORMAL HIGH (ref 0.00–1.49)
Prothrombin Time: 22.9 seconds — ABNORMAL HIGH (ref 11.6–15.2)

## 2014-08-21 LAB — INFLUENZA PANEL BY PCR (TYPE A & B)
H1N1 flu by pcr: NOT DETECTED
Influenza A By PCR: NEGATIVE
Influenza B By PCR: NEGATIVE

## 2014-08-21 LAB — COMPREHENSIVE METABOLIC PANEL
ALBUMIN: 3.1 g/dL — AB (ref 3.5–5.2)
ALK PHOS: 55 U/L (ref 39–117)
ALT: 14 U/L (ref 0–53)
AST: 29 U/L (ref 0–37)
Anion gap: 11 (ref 5–15)
BILIRUBIN TOTAL: 0.5 mg/dL (ref 0.3–1.2)
BUN: 21 mg/dL (ref 6–23)
CALCIUM: 8.6 mg/dL (ref 8.4–10.5)
CO2: 21 mmol/L (ref 19–32)
CREATININE: 1.28 mg/dL (ref 0.50–1.35)
Chloride: 104 mmol/L (ref 96–112)
GFR calc Af Amer: 58 mL/min — ABNORMAL LOW (ref >90)
GFR calc non Af Amer: 50 mL/min — ABNORMAL LOW (ref >90)
Glucose, Bld: 204 mg/dL — ABNORMAL HIGH (ref 70–99)
POTASSIUM: 4.4 mmol/L (ref 3.5–5.1)
Sodium: 136 mmol/L (ref 135–145)
TOTAL PROTEIN: 6.1 g/dL (ref 6.0–8.3)

## 2014-08-21 LAB — CBC WITH DIFFERENTIAL/PLATELET
BASOS ABS: 0 10*3/uL (ref 0.0–0.1)
Basophils Relative: 0 % (ref 0–1)
Eosinophils Absolute: 0 10*3/uL (ref 0.0–0.7)
Eosinophils Relative: 0 % (ref 0–5)
HEMATOCRIT: 33.2 % — AB (ref 39.0–52.0)
HEMOGLOBIN: 10.8 g/dL — AB (ref 13.0–17.0)
LYMPHS PCT: 3 % — AB (ref 12–46)
Lymphs Abs: 0.5 10*3/uL — ABNORMAL LOW (ref 0.7–4.0)
MCH: 30.3 pg (ref 26.0–34.0)
MCHC: 32.5 g/dL (ref 30.0–36.0)
MCV: 93.3 fL (ref 78.0–100.0)
MONO ABS: 0.4 10*3/uL (ref 0.1–1.0)
Monocytes Relative: 2 % — ABNORMAL LOW (ref 3–12)
NEUTROS PCT: 95 % — AB (ref 43–77)
Neutro Abs: 17.3 10*3/uL — ABNORMAL HIGH (ref 1.7–7.7)
Platelets: 188 10*3/uL (ref 150–400)
RBC: 3.56 MIL/uL — AB (ref 4.22–5.81)
RDW: 14.9 % (ref 11.5–15.5)
WBC: 18.2 10*3/uL — AB (ref 4.0–10.5)

## 2014-08-21 LAB — LACTIC ACID, PLASMA
LACTIC ACID, VENOUS: 4.5 mmol/L — AB (ref 0.5–2.0)
Lactic Acid, Venous: 3.4 mmol/L (ref 0.5–2.0)
Lactic Acid, Venous: 4.7 mmol/L (ref 0.5–2.0)

## 2014-08-21 LAB — MRSA PCR SCREENING: MRSA BY PCR: NEGATIVE

## 2014-08-21 LAB — STREP PNEUMONIAE URINARY ANTIGEN: STREP PNEUMO URINARY ANTIGEN: NEGATIVE

## 2014-08-21 MED ORDER — IPRATROPIUM-ALBUTEROL 0.5-2.5 (3) MG/3ML IN SOLN
3.0000 mL | RESPIRATORY_TRACT | Status: DC
  Administered 2014-08-21: 3 mL via RESPIRATORY_TRACT
  Filled 2014-08-21: qty 3

## 2014-08-21 MED ORDER — ESCITALOPRAM OXALATE 20 MG PO TABS
20.0000 mg | ORAL_TABLET | Freq: Every day | ORAL | Status: DC
  Administered 2014-08-21 – 2014-08-24 (×4): 20 mg via ORAL
  Filled 2014-08-21 (×4): qty 1

## 2014-08-21 MED ORDER — FENTANYL 25 MCG/HR TD PT72
25.0000 ug | MEDICATED_PATCH | TRANSDERMAL | Status: DC
  Administered 2014-08-22: 25 ug via TRANSDERMAL
  Filled 2014-08-21: qty 1

## 2014-08-21 MED ORDER — ATORVASTATIN CALCIUM 10 MG PO TABS
10.0000 mg | ORAL_TABLET | Freq: Every evening | ORAL | Status: DC
  Administered 2014-08-21 – 2014-08-23 (×3): 10 mg via ORAL
  Filled 2014-08-21 (×4): qty 1

## 2014-08-21 MED ORDER — ZOLPIDEM TARTRATE 5 MG PO TABS
5.0000 mg | ORAL_TABLET | Freq: Every evening | ORAL | Status: DC | PRN
  Administered 2014-08-21 – 2014-08-23 (×4): 5 mg via ORAL
  Filled 2014-08-21 (×4): qty 1

## 2014-08-21 MED ORDER — METOPROLOL SUCCINATE 12.5 MG HALF TABLET
12.5000 mg | ORAL_TABLET | Freq: Every day | ORAL | Status: DC
  Administered 2014-08-21 – 2014-08-24 (×4): 12.5 mg via ORAL
  Filled 2014-08-21 (×4): qty 1

## 2014-08-21 MED ORDER — WARFARIN - PHARMACIST DOSING INPATIENT
Freq: Every day | Status: DC
  Administered 2014-08-21 – 2014-08-22 (×2)

## 2014-08-21 MED ORDER — VANCOMYCIN HCL IN DEXTROSE 750-5 MG/150ML-% IV SOLN
750.0000 mg | Freq: Two times a day (BID) | INTRAVENOUS | Status: DC
  Administered 2014-08-21: 750 mg via INTRAVENOUS
  Filled 2014-08-21 (×2): qty 150

## 2014-08-21 MED ORDER — METHYLPREDNISOLONE SODIUM SUCC 125 MG IJ SOLR
60.0000 mg | Freq: Four times a day (QID) | INTRAMUSCULAR | Status: DC
  Administered 2014-08-21 – 2014-08-22 (×6): 60 mg via INTRAVENOUS
  Filled 2014-08-21 (×8): qty 0.96
  Filled 2014-08-21: qty 2
  Filled 2014-08-21: qty 0.96

## 2014-08-21 MED ORDER — WARFARIN SODIUM 5 MG PO TABS
5.0000 mg | ORAL_TABLET | ORAL | Status: DC
  Administered 2014-08-21: 5 mg via ORAL
  Filled 2014-08-21 (×2): qty 1

## 2014-08-21 MED ORDER — ALBUTEROL SULFATE (2.5 MG/3ML) 0.083% IN NEBU: 2.5000 mg | INHALATION_SOLUTION | RESPIRATORY_TRACT | Status: DC | PRN

## 2014-08-21 MED ORDER — HYDROCODONE-ACETAMINOPHEN 5-325 MG PO TABS
1.0000 | ORAL_TABLET | ORAL | Status: DC | PRN
  Administered 2014-08-21 – 2014-08-23 (×10): 1 via ORAL
  Filled 2014-08-21 (×10): qty 1

## 2014-08-21 MED ORDER — ALBUTEROL SULFATE (2.5 MG/3ML) 0.083% IN NEBU
2.5000 mg | INHALATION_SOLUTION | RESPIRATORY_TRACT | Status: DC | PRN
  Administered 2014-08-24: 2.5 mg via RESPIRATORY_TRACT
  Filled 2014-08-21: qty 3

## 2014-08-21 MED ORDER — SODIUM CHLORIDE 0.9 % IV SOLN
INTRAVENOUS | Status: DC
  Administered 2014-08-21 (×2): via INTRAVENOUS

## 2014-08-21 MED ORDER — TIOTROPIUM BROMIDE MONOHYDRATE 18 MCG IN CAPS: 18.0000 ug | ORAL_CAPSULE | Freq: Every day | RESPIRATORY_TRACT | Status: DC | PRN

## 2014-08-21 MED ORDER — NITROGLYCERIN 0.4 MG SL SUBL: 0.4000 mg | SUBLINGUAL_TABLET | SUBLINGUAL | Status: DC | PRN

## 2014-08-21 MED ORDER — IPRATROPIUM-ALBUTEROL 0.5-2.5 (3) MG/3ML IN SOLN
3.0000 mL | Freq: Four times a day (QID) | RESPIRATORY_TRACT | Status: DC
  Administered 2014-08-21 – 2014-08-23 (×8): 3 mL via RESPIRATORY_TRACT
  Filled 2014-08-21 (×9): qty 3

## 2014-08-21 MED ORDER — CEFTRIAXONE SODIUM 1 G IJ SOLR
1.0000 g | INTRAMUSCULAR | Status: DC
Start: 2014-08-21 — End: 2014-08-23
  Administered 2014-08-21 – 2014-08-22 (×2): 1 g via INTRAVENOUS
  Filled 2014-08-21 (×4): qty 10

## 2014-08-21 MED ORDER — SIMVASTATIN 10 MG PO TABS: 10.0000 mg | ORAL_TABLET | Freq: Every evening | ORAL | Status: DC

## 2014-08-21 MED ORDER — WARFARIN SODIUM 7.5 MG PO TABS: 7.5000 mg | ORAL_TABLET | ORAL | Status: DC

## 2014-08-21 MED ORDER — AZITHROMYCIN 500 MG IV SOLR
500.0000 mg | INTRAVENOUS | Status: DC
Start: 2014-08-21 — End: 2014-08-23
  Administered 2014-08-21 – 2014-08-22 (×2): 500 mg via INTRAVENOUS
  Filled 2014-08-21 (×4): qty 500

## 2014-08-21 MED ORDER — OSELTAMIVIR PHOSPHATE 30 MG PO CAPS
30.0000 mg | ORAL_CAPSULE | Freq: Two times a day (BID) | ORAL | Status: DC
  Administered 2014-08-21 (×2): 30 mg via ORAL
  Filled 2014-08-21 (×3): qty 1

## 2014-08-21 NOTE — ED Notes (Signed)
Peter Richards, contact number [phone], reports she is medical power of attorney, no paperwork on arrival.

## 2014-08-21 NOTE — ED Notes (Signed)
Attempted to call report

## 2014-08-21 NOTE — Progress Notes (Addendum)
ANTIBIOTIC and ANTICOAGULATION CONSULT NOTE - INITIAL  Pharmacy Consult for Warfarin  Indication: Afib  Pharmacy Consult for Vancomycin  Indication: rule out pneumonia  Allergies  Allergen Reactions  . Codeine Nausea And Vomiting   Patient Measurements: ~75 kg  Vital Signs: Temp: 97.9 F (36.6 C) (03/19 0057) Temp Source: Oral (03/19 0057) BP: 122/72 mmHg (03/19 0057) Pulse Rate: 99 (03/19 0057)  Labs:  Recent Labs  08/20/14 2155  WBC 21.6*  HGB 13.0  PLT 235  CREATININE 1.25    Medical History: Past Medical History  Diagnosis Date  . Hyperlipidemia   . Orthostatic hypotension   . Bradycardia   . Syncope     s/p PTVP  . Myocardial infarction     1981  . CAD (coronary artery disease)     prior CABG in 2002 negative Myoview in 2009  . Hypertension   . Stroke     TIA  2007    . GERD (gastroesophageal reflux disease)   . Headache(784.0)   . Arthritis     OSTEOPOROSIS  . Aortic stenosis, mild     by 01/08/08 echo  . Chronic anticoagulation   . Depression   . AAA (abdominal aortic aneurysm)   . Heart attack   . Pacemaker     medtronic pacer  . History of cervical spinal surgery     [place]  [name] HOH (hard of hearing)     BILATERAL HEARING AIDS  . Prostate cancer   . Basal cell adenocarcinoma     'bottom lip" (11/06/2012)  . Bladder cancer   . Attention to urostomy     "since 2006" (11/06/2012)  . Atrial fibrillation     on amiodarone and has PTVP in place  . Exertional shortness of breath   . Chronic radicular pain of lower back   . Anxiety   . COPD (chronic obstructive pulmonary disease)    Assessment: 81 y/o M with shortness of breath, imaging cannot r/o PNA, WBC elevated, renal function ok, other labs as above.   Goal of Therapy:  Vancomycin trough level 15-20 mcg/ml  Plan:  -Vancomycin 750 mg IV q12h -Ceftriaxone/Azithro per MD -Trend WBC, temp, renal function  -Drug levels as indicated   Sareena Odeh 08/21/2014,1:21  AM  =========================== Addendum 7:14 AM  -Continuing warfarin from home, INR is 2 -Will continue home dose of warfarin (7.5 mg MWF, 5 mg all other days) -Daily PT/INR Sharlynn Oliphant, PharmD

## 2014-08-21 NOTE — Progress Notes (Signed)
Report received from Cherly Hensen, Therapist, sports.

## 2014-08-21 NOTE — ED Notes (Signed)
Dr. Patel at bedside 

## 2014-08-21 NOTE — Progress Notes (Signed)
Patient ID: Peter Richards, male   DOB: [birth year], 81 y.o.   MRN: [id number]  [facility] PROGRESS NOTE  Peter Richards TIW:[id number] DOB: 06/17/1928 DOA: 08/20/2014 PCP: Mathews Argyle, MD   Brief narrative:    81 y.o. male with HTN, HLD, CAD, COPD on 2 L oxygen, presented to [facility] ED with main concern of several weeks duration of progressively worsening dyspnea that initially started with exertion and has progressed to dyspnea at rest over the past 48 hours, associated with fevers, chills, productive cough of yellow sputum, malaise, poor oral intake. Pt denies any known sick contacts or exposures.  Assessment/Plan:    Principal Problem:   Acute and chronic respiratory failure with hypoxia in pt with oxygen dependent COPD - secondary to lobar PNA, LLL - pt clinically improving but still with rhonhi on exam and requiring oxygen  - continue Zithromax and Rocephin that was started in ED - continue BD's scheduled and as needed - follow up on resporatory panel and if no influenza, d/c tamiflu   Sepsis  - criteria met on admission with tachycardia, RR 29 pbm, WBC 21.6, elevated lactic acid, source PNA LLL  - continue zithro and rocephin as noted above - repeat lactic acid today - WBC is trending down  Active Problems:   PACEMAKER, PERMANENT - monitor on tele    A-fib - rate controlled this AM - continue Coumadin per pharmacy  - continue metoprolol   COPD - continue bronchodilators scheduled and as needed - continue solumedrol for now and no planned tapering as pt still wheezing    Hx of bladder cancer - outpatient follow up    Interstitial lung disease  - BD as noted above with solumedrol      Acute drop in Hg - possibly from IVF - no signs of active bleeding  - repeat CBC in AM   Chronic diastolic CHF - per last 2 D ECHO 10/2013 - grade I diastolic dysfunction - monitor daily weights, strict I's and O's  DVT prophylaxis - pt already on Coumadin   Code  Status: Full.  Family Communication:  plan of care discussed with the patient Disposition Plan: Home when stable.   IV access:  Peripheral IV  Procedures and diagnostic studies:    Dg Chest 2 View  08/20/2014    Interstitial lung disease. 2. Concern for superimposed pneumonia in the left lower lobe.    Medical Consultants:  None  Other Consultants:  None  IAnti-Infectives:   Zithromax 3/19 --> Rocephin 3/18 --> Vancomycin 3/18 --> 3/19   Faye Ramsay, MD  [facility] Pager [phone]  If 7PM-7AM, please contact night-coverage [email or web address] Password [email or web address] 08/21/2014, 10:07 AM   LOS: 1 day   HPI/Subjective: No events overnight.   Objective: Filed Vitals:   08/21/14 0125 08/21/14 0236 08/21/14 0330 08/21/14 0900  BP: 130/56  114/46 132/60  Pulse: 111  99   Temp: 98 F (36.7 C)  97.8 F (36.6 C) 97.9 F (36.6 C)  TempSrc: Oral  Oral Oral  Resp: 26  20   Height: _0  (1.727 m)     Weight: 80.3 kg (177 lb 0.5 oz)     SpO2: 100% 97% 98%     Intake/Output Summary (Last 24 hours) at 08/21/14 1007 Last data filed at 08/21/14 0900  Gross per 24 hour  Intake      0 ml  Output    650 ml  Net   -650 ml    Exam:  General:  Pt is alert, follows commands appropriately, not in acute distress  Cardiovascular: Paced rhythm   Respiratory: Course breath sounds bilaterally with expiratory wheezing   Abdomen: Soft, non tender, non distended, bowel sounds present, no guarding  Extremities: No edema, pulses DP and PT palpable bilaterally  Neuro: Grossly nonfocal  Data Reviewed: Basic Metabolic Panel:  Recent Labs Lab 08/20/14 2155 08/21/14 0439  NA 139 136  K 4.1 4.4  CL 103 104  CO2 25 21  GLUCOSE 138* 204*  BUN 23 21  CREATININE 1.25 1.28  CALCIUM 9.3 8.6   Liver Function Tests:  Recent Labs Lab 08/21/14 0439  AST 29  ALT 14  ALKPHOS 55  BILITOT 0.5  PROT 6.1  ALBUMIN 3.1*   CBC:  Recent Labs Lab 08/20/14 2155 08/21/14 0439  WBC 21.6*  18.2*  NEUTROABS  --  17.3*  HGB 13.0 10.8*  HCT 39.7 33.2*  MCV 92.5 93.3  PLT 235 188     Recent Results (from the past 240 hour(s))  MRSA PCR Screening     Status: None   Collection Time: 08/21/14  1:49 AM  Result Value Ref Range Status   MRSA by PCR NEGATIVE NEGATIVE Final     Scheduled Meds: . atorvastatin  10 mg Oral QPM  . azithromycin  500 mg Intravenous Q24H  . cefTRIAXone (ROCEPHIN)  IV  1 g Intravenous Q24H  . escitalopram  20 mg Oral Daily  . fentaNYL  25 mcg Transdermal Q72H  . ipratropium-albuterol  3 mL Nebulization QID  . methylPREDNISolone (SOLU-MEDROL) injection  60 mg Intravenous Q6H  . metoprolol succinate  12.5 mg Oral Daily  . oseltamivir  30 mg Oral BID  . vancomycin  750 mg Intravenous Q12H  . warfarin  5 mg Oral Q T,Th,S,Su-1800  . [START ON 08/23/2014] warfarin  7.5 mg Oral Q M,W,F-1800  . Warfarin - Pharmacist Dosing Inpatient   Does not apply q1800   Continuous Infusions:

## 2014-08-21 NOTE — ED Provider Notes (Signed)
CSN: [id number]     Arrival date & time 08/20/14  2139 History   First MD Initiated Contact with Patient 08/20/14 2157     Chief Complaint  Patient presents with  . Shortness of Breath     (Consider location/radiation/quality/duration/timing/severity/associated sxs/prior Treatment) HPI   This is an 81 year old male with past medical history of hyperlipidemia, coronary disease, hypertension, CVA, COPD with to her home oxygen requirement who comes in today with shortness of breath since this afternoon with no real associated symptoms. He has not noticed any cough or fevers at home. He denies any chest pain. He has not been hospitalized the last 3 months and lives at home.   Past Medical History  Diagnosis Date  . Hyperlipidemia   . Orthostatic hypotension   . Bradycardia   . Syncope     s/p PTVP  . Myocardial infarction     1981  . CAD (coronary artery disease)     prior CABG in 2002 negative Myoview in 2009  . Hypertension   . Stroke     TIA  2007    . GERD (gastroesophageal reflux disease)   . Headache(784.0)   . Arthritis     OSTEOPOROSIS  . Aortic stenosis, mild     by 01/08/08 echo  . Chronic anticoagulation   . Depression   . AAA (abdominal aortic aneurysm)   . Heart attack   . Pacemaker     medtronic pacer  . History of cervical spinal surgery     [place]  [name] HOH (hard of hearing)     BILATERAL HEARING AIDS  . Prostate cancer   . Basal cell adenocarcinoma     'bottom lip" (11/06/2012)  . Bladder cancer   . Attention to urostomy     "since 2006" (11/06/2012)  . Atrial fibrillation     on amiodarone and has PTVP in place  . Exertional shortness of breath   . Chronic radicular pain of lower back   . Anxiety   . COPD (chronic obstructive pulmonary disease)    Past Surgical History  Procedure Laterality Date  . Insert / replace / remove pacemaker      11/2009  DR BRODIE    . Prostatectomy      BLADDER ALSO REMOVED (OSTOMY BAG)  2006  FOR CANCER     .  Abdominal aortic aneurysm repair  1999    STENT PLACED    . Cataract extraction w/ intraocular lens  implant, bilateral    . Appendectomy      1959  . Revision urostomy cutaneous    . Revision urostomy cutaneous  2006    Urostomy bag placed on right lower abdomen  . Vertebroplasty  08/22/2011    Procedure: VERTEBROPLASTY;  Surgeon: Winfield Cunas, MD;  Location: [place] [facility];  Service: Neurosurgery;  Laterality: N/A;  Lumbar Two vertebroplasty  . Cholecystectomy      4 YRS AGO  . Posterior lumbar fusion  11/06/2012  . Coronary artery bypass graft  1999    CABG X2  . Skin cancer excision      "bottom lip" (11/06/2012)  . Bladder surgery      "for cancer" (11/06/2012)  . Lumbar laminectomy/decompression microdiscectomy Bilateral 11/06/2012    Procedure: LUMBAR LAMINECTOMY/DECOMPRESSION MICRODISCECTOMY;  Surgeon: Sinclair Ship, MD;  Location: [place];  Service: Orthopedics;  Laterality: Bilateral;  Lumbar 2-5 decompression   Family History  Problem Relation Age of Onset  . Cancer Father  stomach  . Cancer Sister     lung  . Heart disease Sister   . Hypertension Sister   . Heart disease Mother   . Hypertension Mother    History  Substance Use Topics  . Smoking status: Former Smoker -- 3.00 packs/day for 50 years    Types: Cigarettes    Quit date: 06/04/1978  . Smokeless tobacco: Current User    Types: Snuff  . Alcohol Use: No     Comment: rare "once a month"    Review of Systems  Constitutional: Negative for fever and chills.  Eyes: Negative for redness.  Respiratory: Positive for shortness of breath. Negative for cough.   Cardiovascular: Negative for chest pain.  Gastrointestinal: Negative for nausea, vomiting, abdominal pain and diarrhea.  Genitourinary: Negative for dysuria.  Skin: Negative for rash.  Neurological: Negative for headaches.  All other systems reviewed and are negative.     Allergies  Codeine  Home Medications   Prior to Admission  medications   Medication Sig Start Date End Date Taking? Authorizing Provider  atorvastatin (LIPITOR) 10 MG tablet Take 1 tablet by mouth every evening. 03/19/14  Yes Historical Provider, MD  diphenhydramine-acetaminophen (TYLENOL PM) 25-500 MG TABS Take 1 tablet by mouth at bedtime as needed (sleep).   Yes Historical Provider, MD  escitalopram (LEXAPRO) 20 MG tablet Take 20 mg by mouth daily.   Yes Historical Provider, MD  fentaNYL (DURAGESIC - DOSED MCG/HR) 25 MCG/HR patch Place 1 patch (25 mcg total) onto the skin every 3 (three) days. 10/06/13  Yes Belkys A Regalado, MD  HYDROcodone-acetaminophen (NORCO/VICODIN) 5-325 MG per tablet Take 1 tablet by mouth every 4 (four) hours as needed for severe pain.    Yes Historical Provider, MD  metoprolol succinate (TOPROL-XL) 25 MG 24 hr tablet Take 0.5 tablets (12.5 mg total) by mouth daily. 08/03/14  Yes Darlin Coco, MD  nitroGLYCERIN (NITROSTAT) 0.4 MG SL tablet Place 0.4 mg under the tongue every 5 (five) minutes as needed for chest pain (MAX 3 TABLETS).   Yes Historical Provider, MD  PRESCRIPTION MEDICATION Wears 2 L of O2 at night   Yes Historical Provider, MD  simvastatin (ZOCOR) 10 MG tablet Take 10 mg by mouth every evening.   Yes Historical Provider, MD  tiotropium (SPIRIVA) 18 MCG inhalation capsule Place 1 capsule (18 mcg total) into inhaler and inhale daily as needed (for shortness of breath). 02/03/14  Yes Collene Gobble, MD  VOLTAREN 1 % GEL Apply 2 g topically daily as needed (pain).  12/10/13  Yes Historical Provider, MD  warfarin (COUMADIN) 5 MG tablet Take 5-7.5 mg by mouth every evening. Take 7.5mg  on Monday, Wednesday and Friday. Take 5mg  all other days of the week. 10/06/13  Yes Belkys A Regalado, MD  zolpidem (AMBIEN) 5 MG tablet Take 1 tablet (5 mg total) by mouth at bedtime as needed for sleep. 06/09/14  Yes Darlin Coco, MD  Calcium 500 MG CHEW  05/21/14   Historical Provider, MD  Multiple Vitamins-Minerals (EYE VITAMINS PO)  05/21/14    Historical Provider, MD   BP 125/53 mmHg  Pulse 106  Temp(Src) 98.8 F (37.1 C) (Temporal)  Resp 25  SpO2 97% Physical Exam  Constitutional: He is oriented to person, place, and time. No distress.  HENT:  Head: Normocephalic and atraumatic.  Eyes: EOM are normal. Pupils are equal, round, and reactive to light.  Neck: Normal range of motion. Neck supple.  Cardiovascular: Normal rate.   Pulmonary/Chest: Tachypnea noted.  He is in respiratory distress. He has rhonchi (diffuse). He has rales.  Abdominal: Soft. There is no tenderness.  Musculoskeletal: Normal range of motion.  Neurological: He is alert and oriented to person, place, and time.  Skin: No rash noted. He is not diaphoretic.  Psychiatric: He has a normal mood and affect.    ED Course  Procedures (including critical care time) Labs Review Labs Reviewed  BASIC METABOLIC PANEL - Abnormal; Notable for the following:    Glucose, Bld 138 (*)    GFR calc non Af Amer 51 (*)    GFR calc Af Amer 59 (*)    All other components within normal limits  CBC - Abnormal; Notable for the following:    WBC 21.6 (*)    All other components within normal limits  I-STAT VENOUS BLOOD GAS, ED - Abnormal; Notable for the following:    pH, Ven 7.315 (*)    pCO2, Ven 51.5 (*)    Bicarbonate 26.3 (*)    All other components within normal limits  I-STAT CG4 LACTIC ACID, ED - Abnormal; Notable for the following:    Lactic Acid, Venous 3.26 (*)    All other components within normal limits  CULTURE, BLOOD (ROUTINE X 2)  CULTURE, BLOOD (ROUTINE X 2)  BRAIN NATRIURETIC PEPTIDE  INFLUENZA PANEL BY PCR (TYPE A & B, H1N1)  I-STAT TROPOININ, ED    Imaging Review Dg Chest 2 View  08/20/2014   CLINICAL DATA:  Increase short of breath, COPD  EXAM: CHEST  2 VIEW  COMPARISON:  Radiograph 02/26/2014  FINDINGS: Left-sided pacemaker overlies normal cardiac silhouette. There is chronic interstitial markings most dense in the left lower lobe. These  markings are slightly increased compared to prior. There is linear markings the right upper lobe. No pleural fluid. No pneumothorax  IMPRESSION: 1. Interstitial lung disease. 2. Concern for superimposed pneumonia in the left lower lobe.   Electronically Signed   By: Suzy Bouchard M.D.   On: 08/20/2014 22:47     EKG Interpretation   Date/Time:  Friday August 20 2014 21:52:12 EDT Ventricular Rate:  96 PR Interval:    QRS Duration: 97 QT Interval:  342 QTC Calculation: 432 R Axis:   11 Text Interpretation:  Sinus rhythm Nonspecific T wave abnormality No  significant change since last tracing Confirmed by Ashok Cordia  MD, Lennette Bihari  ([id number]) on 08/20/2014 10:07:02 PM      MDM   Final diagnoses:  CAP (community acquired pneumonia)    This is an 81 year old male with past medical history of hyperlipidemia, coronary disease, hypertension, CVA, COPD with to her home oxygen requirement who comes in today with shortness of breath since this afternoon with no real associated symptoms.  Exam as above, afebrile, tachypnic.  Increased WOB w/ rhonchi/crackles bilaterally.  Concern for possible PNA.  Patient got solumedrol w/ EMS.  Patient has no hx of prev dvt/pe, no leg swelling at this time.  Will obtain cbc/bmp/istat lactic/trop/bnp/CXR/EKG  Cbc w/ leukocytosis, lactic acid elevated.  CXR w/ L sided infiltrate.  Will cover w/ rocephin/azithro for CAP.  Will obtain influenza. Will get blood cx X2, give 2L NS.  Patient satting well on venti-mask 50%   Have spoken w/ hospitalist, will admit to step down for further management.    Jarome Matin, MD 08/21/14 1840  Lajean Saver, MD 08/21/14 0100

## 2014-08-21 NOTE — H&P (Signed)
[facility] History and Physical  Patient: Peter Richards  MRN: [id number]  DOB: 11/16/1928  DOS: the patient was seen and examined on 08/21/2014 PCP: Mathews Argyle, MD  Chief Complaint: Shortness of breath  HPI: Peter Richards is a 81 y.o. male with Past medical history of dyslipidemia, coronary artery disease, hypertension, TIA, GERD, COPD on 2 L of oxygen. The patient presents with complaints of shortness of breath that started this afternoon. He mentions he has progressively worsening shortness of breath over last 3-4 weeks. He denies any fever denies any recent travel and denies any leg tenderness. He denies any chest pain. He denies any sick contacts. He denies any changes in his medications. He had some runny nose. He denies any choking episodes. He tried to use his oxygen which he only uses at night in the office known to feel better but was not feeling any improvement and therefore they called EMS. EMS found that the patient's oxygen concentrator was empty.  The patient is coming from home And at his baseline independent for most of his ADL.  Review of Systems: as mentioned in the history of present illness.  A Comprehensive review of the other systems is negative.  Past Medical History  Diagnosis Date  . Hyperlipidemia   . Orthostatic hypotension   . Bradycardia   . Syncope     s/p PTVP  . Myocardial infarction     1981  . CAD (coronary artery disease)     prior CABG in 2002 negative Myoview in 2009  . Hypertension   . Stroke     TIA  2007    . GERD (gastroesophageal reflux disease)   . Headache(784.0)   . Arthritis     OSTEOPOROSIS  . Aortic stenosis, mild     by 01/08/08 echo  . Chronic anticoagulation   . Depression   . AAA (abdominal aortic aneurysm)   . Heart attack   . Pacemaker     medtronic pacer  . History of cervical spinal surgery     [place]  [name] HOH (hard of hearing)     BILATERAL HEARING AIDS  . Prostate cancer    . Basal cell adenocarcinoma     'bottom lip" (11/06/2012)  . Bladder cancer   . Attention to urostomy     "since 2006" (11/06/2012)  . Atrial fibrillation     on amiodarone and has PTVP in place  . Exertional shortness of breath   . Chronic radicular pain of lower back   . Anxiety   . COPD (chronic obstructive pulmonary disease)    Past Surgical History  Procedure Laterality Date  . Insert / replace / remove pacemaker      11/2009  DR BRODIE    . Prostatectomy      BLADDER ALSO REMOVED (OSTOMY BAG)  2006  FOR CANCER     . Abdominal aortic aneurysm repair  1999    STENT PLACED    . Cataract extraction w/ intraocular lens  implant, bilateral    . Appendectomy      1959  . Revision urostomy cutaneous    . Revision urostomy cutaneous  2006    Urostomy bag placed on right lower abdomen  . Vertebroplasty  08/22/2011    Procedure: VERTEBROPLASTY;  Surgeon: Winfield Cunas, MD;  Location: [place] [facility];  Service: Neurosurgery;  Laterality: N/A;  Lumbar Two vertebroplasty  . Cholecystectomy      4 YRS AGO  .  Posterior lumbar fusion  11/06/2012  . Coronary artery bypass graft  1999    CABG X2  . Skin cancer excision      "bottom lip" (11/06/2012)  . Bladder surgery      "for cancer" (11/06/2012)  . Lumbar laminectomy/decompression microdiscectomy Bilateral 11/06/2012    Procedure: LUMBAR LAMINECTOMY/DECOMPRESSION MICRODISCECTOMY;  Surgeon: Sinclair Ship, MD;  Location: [facility];  Service: Orthopedics;  Laterality: Bilateral;  Lumbar 2-5 decompression   Social History:  reports that he quit smoking about 36 years ago. His smoking use included Cigarettes. He has a 150 pack-year smoking history. His smokeless tobacco use includes Snuff. He reports that he does not drink alcohol or use illicit drugs.  Allergies  Allergen Reactions  . Codeine Nausea And Vomiting    Family History  Problem Relation Age of Onset  . Cancer Father     stomach  . Cancer Sister     lung  . Heart disease Sister    . Hypertension Sister   . Heart disease Mother   . Hypertension Mother     Prior to Admission medications   Medication Sig Start Date End Date Taking? Authorizing Provider  atorvastatin (LIPITOR) 10 MG tablet Take 1 tablet by mouth every evening. 03/19/14  Yes Historical Provider, MD  diphenhydramine-acetaminophen (TYLENOL PM) 25-500 MG TABS Take 1 tablet by mouth at bedtime as needed (sleep).   Yes Historical Provider, MD  escitalopram (LEXAPRO) 20 MG tablet Take 20 mg by mouth daily.   Yes Historical Provider, MD  fentaNYL (DURAGESIC - DOSED MCG/HR) 25 MCG/HR patch Place 1 patch (25 mcg total) onto the skin every 3 (three) days. 10/06/13  Yes Belkys A Regalado, MD  HYDROcodone-acetaminophen (NORCO/VICODIN) 5-325 MG per tablet Take 1 tablet by mouth every 4 (four) hours as needed for severe pain.    Yes Historical Provider, MD  metoprolol succinate (TOPROL-XL) 25 MG 24 hr tablet Take 0.5 tablets (12.5 mg total) by mouth daily. 08/03/14  Yes Darlin Coco, MD  nitroGLYCERIN (NITROSTAT) 0.4 MG SL tablet Place 0.4 mg under the tongue every 5 (five) minutes as needed for chest pain (MAX 3 TABLETS).   Yes Historical Provider, MD  PRESCRIPTION MEDICATION Wears 2 L of O2 at night   Yes Historical Provider, MD  simvastatin (ZOCOR) 10 MG tablet Take 10 mg by mouth every evening.   Yes Historical Provider, MD  tiotropium (SPIRIVA) 18 MCG inhalation capsule Place 1 capsule (18 mcg total) into inhaler and inhale daily as needed (for shortness of breath). 02/03/14  Yes Collene Gobble, MD  VOLTAREN 1 % GEL Apply 2 g topically daily as needed (pain).  12/10/13  Yes Historical Provider, MD  warfarin (COUMADIN) 5 MG tablet Take 5-7.5 mg by mouth every evening. Take 7.5mg  on Monday, Wednesday and Friday. Take 5mg  all other days of the week. 10/06/13  Yes Belkys A Regalado, MD  zolpidem (AMBIEN) 5 MG tablet Take 1 tablet (5 mg total) by mouth at bedtime as needed for sleep. 06/09/14  Yes Darlin Coco, MD  Calcium  500 MG CHEW  05/21/14   Historical Provider, MD  Multiple Vitamins-Minerals (EYE VITAMINS PO)  05/21/14   Historical Provider, MD    Physical Exam: Filed Vitals:   08/21/14 0057 08/21/14 0125 08/21/14 0236 08/21/14 0330  BP: 122/72 130/56  114/46  Pulse: 99 111  99  Temp: 97.9 F (36.6 C) 98 F (36.7 C)  97.8 F (36.6 C)  TempSrc: Oral Oral  Oral  Resp: 20  26  20  Height:  5\' 8"  (1.727 m)    Weight:  80.3 kg (177 lb 0.5 oz)    SpO2: 98% 100% 97% 98%    General: Alert, Awake and Oriented to Time, Place and Person. Appear in mild distress Eyes: PERRL ENT: Oral Mucosa clear moist. Neck: no JVD Cardiovascular: S1 and S2 Present,  Murmur, Peripheral Pulses Present Respiratory: Bilateral Air entry equal and Decreased, Clear to Auscultation, noCrackles, extensive bilateral rhonchi as well as expiratory wheezes Abdomen: Bowel Sound present, Soft and non tender Skin: no Rash Extremities: no Pedal edema, no calf tenderness Neurologic: Grossly no focal neuro deficit.  Labs on Admission:  CBC:  Recent Labs Lab 08/20/14 2155  WBC 21.6*  HGB 13.0  HCT 39.7  MCV 92.5  PLT 235    CMP     Component Value Date/Time   NA 139 08/20/2014 2155   K 4.1 08/20/2014 2155   CL 103 08/20/2014 2155   CO2 25 08/20/2014 2155   GLUCOSE 138* 08/20/2014 2155   BUN 23 08/20/2014 2155   CREATININE 1.25 08/20/2014 2155   CALCIUM 9.3 08/20/2014 2155   PROT 7.7 10/02/2013 2348   ALBUMIN 3.7 10/02/2013 2348   AST 15 10/02/2013 2348   ALT 10 10/02/2013 2348   ALKPHOS 98 10/02/2013 2348   BILITOT 0.6 10/02/2013 2348   GFRNONAA 51* 08/20/2014 2155   GFRAA 59* 08/20/2014 2155    No results for input(s): LIPASE, AMYLASE in the last 168 hours.  No results for input(s): CKTOTAL, CKMB, CKMBINDEX, TROPONINI in the last 168 hours. BNP (last 3 results)  Recent Labs  08/20/14 2155  BNP 49.4    ProBNP (last 3 results) No results for input(s): PROBNP in the last 8760 hours.   Radiological  Exams on Admission: Dg Chest 2 View  08/20/2014   CLINICAL DATA:  Increase short of breath, COPD  EXAM: CHEST  2 VIEW  COMPARISON:  Radiograph 02/26/2014  FINDINGS: Left-sided pacemaker overlies normal cardiac silhouette. There is chronic interstitial markings most dense in the left lower lobe. These markings are slightly increased compared to prior. There is linear markings the right upper lobe. No pleural fluid. No pneumothorax  IMPRESSION: 1. Interstitial lung disease. 2. Concern for superimposed pneumonia in the left lower lobe.   Electronically Signed   By: Suzy Bouchard M.D.   On: 08/20/2014 22:47    Assessment/Plan Principal Problem:   Acute and chronic respiratory failure with hypoxia Active Problems:   PACEMAKER, PERMANENT   A-fib   Hx of bladder cancer   Interstitial lung disease   COPD (chronic obstructive pulmonary disease)   CAP (community acquired pneumonia)   1. Acute and chronic respiratory failure with hypoxia The patient is presenting with complains of acute on chronic respiratory failure with hypoxia. Most likely etiology is COPD exacerbation. Possibly bronchitis. Check influenza PCR and antigens. Follow cultures. Treat with azithromycin and ceftriaxone, Tamiflu Solu-Medrol and DuoNeb's BiPAP as needed. Discontinue Tamiflu if influenza PCR is negative.  2. Chronic A. fib. Check INR continue warfarin. Also continue metoprolol.  3. Chronic pain. Continue fentanyl patch.  4. Lactic acidosis. Continue gentle hydration.  Advance goals of care discussion: Full code   DVT Prophylaxis: on chronic therapeutic anticoagulation. Nutrition: Cardiac diet  Family Communication: Family was present at bedside, opportunity was given to ask question and all questions were answered satisfactorily at the time of interview. Disposition: Admitted to inpatient in step-down unit.  Author: Berle Mull, MD [facility] Pager: [phone]  08/21/2014, 6:27 AM    If  7PM-7AM, please contact night-coverage [email or web address] Password [email or web address]

## 2014-08-21 NOTE — Progress Notes (Addendum)
Pt toTX to 5W-29, VSS, called report.  Called family to notify of [facility].  Paged DR re: Lactic acid values: 3.4, 4.7  New order to d/c [place] order

## 2014-08-21 NOTE — Progress Notes (Signed)
ANTICOAGULATION CONSULT NOTE - Follow up  Pharmacy Consult for Warfarin  Indication: Afib  Allergies  Allergen Reactions  . Codeine Nausea And Vomiting   Patient Measurements: ~75 kg  Vital Signs: Temp: 97.8 F (36.6 C) (03/19 0330) Temp Source: Oral (03/19 0330) BP: 114/46 mmHg (03/19 0330) Pulse Rate: 99 (03/19 0330)  Labs:  Recent Labs  08/20/14 2155 08/21/14 0439  WBC 21.6* 18.2*  HGB 13.0 10.8*  PLT 235 188  CREATININE 1.25 1.28    Medical History: Past Medical History  Diagnosis Date  . Hyperlipidemia   . Orthostatic hypotension   . Bradycardia   . Syncope     s/p PTVP  . Myocardial infarction     1981  . CAD (coronary artery disease)     prior CABG in 2002 negative Myoview in 2009  . Hypertension   . Stroke     TIA  2007    . GERD (gastroesophageal reflux disease)   . Headache(784.0)   . Arthritis     OSTEOPOROSIS  . Aortic stenosis, mild     by 01/08/08 echo  . Chronic anticoagulation   . Depression   . AAA (abdominal aortic aneurysm)   . Heart attack   . Pacemaker     medtronic pacer  . History of cervical spinal surgery     [place]  [name] HOH (hard of hearing)     BILATERAL HEARING AIDS  . Prostate cancer   . Basal cell adenocarcinoma     'bottom lip" (11/06/2012)  . Bladder cancer   . Attention to urostomy     "since 2006" (11/06/2012)  . Atrial fibrillation     on amiodarone and has PTVP in place  . Exertional shortness of breath   . Chronic radicular pain of lower back   . Anxiety   . COPD (chronic obstructive pulmonary disease)    Assessment: 81 y/o M continuing home warfarin for afib. INR on admit therapeutic at 2. Home dose: 7.5mg  MWF, 5mg  all other days. Hgb drop 13>>10.8, plt 235>>188, will continue to monitor.  Goal of Therapy:  INR 2-3  Plan:  - Warfarin 5mg  tonight - Daily INR - Monitor s/sx bleeding  Sravya Grissom E. Padme Arriaga, Pharm.D Clinical Pharmacy Resident Pager: [phone] 08/21/2014 8:14 AM

## 2014-08-22 LAB — CBC
HCT: 34 % — ABNORMAL LOW (ref 39.0–52.0)
Hemoglobin: 11.1 g/dL — ABNORMAL LOW (ref 13.0–17.0)
MCH: 30.5 pg (ref 26.0–34.0)
MCHC: 32.6 g/dL (ref 30.0–36.0)
MCV: 93.4 fL (ref 78.0–100.0)
PLATELETS: 190 10*3/uL (ref 150–400)
RBC: 3.64 MIL/uL — AB (ref 4.22–5.81)
RDW: 15.4 % (ref 11.5–15.5)
WBC: 24.4 10*3/uL — AB (ref 4.0–10.5)

## 2014-08-22 LAB — BASIC METABOLIC PANEL
Anion gap: 7 (ref 5–15)
BUN: 20 mg/dL (ref 6–23)
CO2: 24 mmol/L (ref 19–32)
CREATININE: 0.98 mg/dL (ref 0.50–1.35)
Calcium: 9 mg/dL (ref 8.4–10.5)
Chloride: 107 mmol/L (ref 96–112)
GFR calc Af Amer: 85 mL/min — ABNORMAL LOW (ref >90)
GFR calc non Af Amer: 73 mL/min — ABNORMAL LOW (ref >90)
GLUCOSE: 148 mg/dL — AB (ref 70–99)
POTASSIUM: 5.1 mmol/L (ref 3.5–5.1)
SODIUM: 138 mmol/L (ref 135–145)

## 2014-08-22 LAB — PROTIME-INR
INR: 1.8 — ABNORMAL HIGH (ref 0.00–1.49)
PROTHROMBIN TIME: 21 s — AB (ref 11.6–15.2)

## 2014-08-22 LAB — LACTIC ACID, PLASMA: LACTIC ACID, VENOUS: 3.3 mmol/L — AB (ref 0.5–2.0)

## 2014-08-22 MED ORDER — WARFARIN SODIUM 7.5 MG PO TABS
7.5000 mg | ORAL_TABLET | Freq: Once | ORAL | Status: AC
  Administered 2014-08-22: 7.5 mg via ORAL
  Filled 2014-08-22: qty 1

## 2014-08-22 MED ORDER — GUAIFENESIN ER 600 MG PO TB12
600.0000 mg | ORAL_TABLET | Freq: Two times a day (BID) | ORAL | Status: DC
  Administered 2014-08-22 – 2014-08-24 (×5): 600 mg via ORAL
  Filled 2014-08-22 (×6): qty 1

## 2014-08-22 MED ORDER — METHYLPREDNISOLONE SODIUM SUCC 40 MG IJ SOLR
40.0000 mg | Freq: Two times a day (BID) | INTRAMUSCULAR | Status: DC
  Administered 2014-08-22 – 2014-08-23 (×2): 40 mg via INTRAVENOUS
  Filled 2014-08-22 (×5): qty 1

## 2014-08-22 MED ORDER — GUAIFENESIN 100 MG/5ML PO SYRP
200.0000 mg | ORAL_SOLUTION | ORAL | Status: DC | PRN
  Administered 2014-08-22 – 2014-08-24 (×2): 200 mg via ORAL
  Filled 2014-08-22 (×5): qty 10

## 2014-08-22 NOTE — Progress Notes (Signed)
ANTICOAGULATION CONSULT NOTE - Follow up  Pharmacy Consult for Warfarin  Indication: Afib  Allergies  Allergen Reactions  . Codeine Nausea And Vomiting   Patient Measurements: ~75 kg  Vital Signs: Temp: 97.3 F (36.3 C) (03/20 0700) Temp Source: Oral (03/20 0700) BP: 147/55 mmHg (03/20 0508) Pulse Rate: 84 (03/20 0508)  Labs:  Recent Labs  08/20/14 2155 08/21/14 0439 08/22/14 0307  WBC 21.6* 18.2* 24.4*  HGB 13.0 10.8* 11.1*  PLT 235 188 190  CREATININE 1.25 1.28 0.98    Medical History: Past Medical History  Diagnosis Date  . Hyperlipidemia   . Orthostatic hypotension   . Bradycardia   . Syncope     s/p PTVP  . Myocardial infarction     1981  . CAD (coronary artery disease)     prior CABG in 2002 negative Myoview in 2009  . Hypertension   . Stroke     TIA  2007    . GERD (gastroesophageal reflux disease)   . Headache(784.0)   . Arthritis     OSTEOPOROSIS  . Aortic stenosis, mild     by 01/08/08 echo  . Chronic anticoagulation   . Depression   . AAA (abdominal aortic aneurysm)   . Heart attack   . Pacemaker     medtronic pacer  . History of cervical spinal surgery     [place]  [name] HOH (hard of hearing)     BILATERAL HEARING AIDS  . Prostate cancer   . Basal cell adenocarcinoma     'bottom lip" (11/06/2012)  . Bladder cancer   . Attention to urostomy     "since 2006" (11/06/2012)  . Atrial fibrillation     on amiodarone and has PTVP in place  . Exertional shortness of breath   . Chronic radicular pain of lower back   . Anxiety   . COPD (chronic obstructive pulmonary disease)    Medications: Warfarin home dose: 7.5mg  MWF, 5mg  all other days.   Assessment: 81 y/o M on warfarin PTA for afib. INR on admit was therapeutic at 2. INR today SUBtherapeutic at 1.8, will boost. Hgb drop 13>>10.8, plt 235>>188 yesterday but have started to increase again, will continue to monitor.  Goal of Therapy:  INR 2-3  Plan:  - Warfarin 7.5mg  tonight -  Daily INR - Monitor s/sx bleeding  Chan Rosasco E. Athene Schuhmacher, Pharm.D Clinical Pharmacy Resident Pager: [phone] 08/22/2014 7:52 AM

## 2014-08-22 NOTE — Progress Notes (Signed)
Patient ID: Peter Richards, male   DOB: 07-28-1928, 81 y.o.   MRN: [id number]  [facility] PROGRESS NOTE  Peter Richards GEZ:[id number] DOB: 1928-10-13 DOA: 08/20/2014 PCP: Mathews Argyle, MD   Brief narrative:    81 y.o. male with HTN, HLD, CAD, COPD on 2 L oxygen, presented to [facility] ED with main concern of several weeks duration of progressively worsening dyspnea that initially started with exertion and has progressed to dyspnea at rest over the past 48 hours, associated with fevers, chills, productive cough of yellow sputum, malaise, poor oral intake. Pt denies any known sick contacts or exposures.  Assessment/Plan:    Principal Problem:   Acute and chronic respiratory failure with hypoxia in pt with oxygen dependent COPD - secondary to lobar PNA, LLL - pt clinically improving but still with rhonhi on exam and requiring oxygen, much better compared to yesterday  - continue Zithromax and Rocephin that was started in ED, day #2/7 - continue BD's scheduled and as needed - d/c tamiflu as influenza negative    Sepsis  - criteria met on admission with tachycardia, RR 29 pbm, WBC 21.6, elevated lactic acid, source PNA LLL  - continue zithro and rocephin as noted above - repeat lactic acid today pending  - WBC is trending up but likely from steroids rather than an infectious etiology  - pt remains afebrile over the past 24 hours   Active Problems:   PACEMAKER, PERMANENT - monitor on tele    A-fib - rate controlled this AM - continue Coumadin per pharmacy  - continue metoprolol   COPD - continue bronchodilators scheduled and as needed - since no wheezing on exam and rhonchi much improved, will narrow down solumedrol    Hx of bladder cancer - outpatient follow up    Interstitial lung disease  - BD as noted above with solumedrol      Acute drop in Hg - possibly from IVF - no signs of active bleeding, Hg overall stable  - repeat CBC in AM   Chronic diastolic CHF -  per last 2 D ECHO 10/2013 - grade I diastolic dysfunction - monitor daily weights, strict I's and O's - weight remains stable at 177 lbs   DVT prophylaxis - pt already on Coumadin   Code Status: Full.  Family Communication:  plan of care discussed with the patient Disposition Plan: Home when stable. Stable for transfer to telemetry unit   IV access:  Peripheral IV  Procedures and diagnostic studies:    Dg Chest 2 View  08/20/2014    Interstitial lung disease. 2. Concern for superimposed pneumonia in the left lower lobe.    Medical Consultants:  None  Other Consultants:  None  IAnti-Infectives:   Zithromax 3/19 --> Rocephin 3/18 --> Vancomycin 3/18 --> 3/19   Faye Ramsay, MD  [facility] Pager [phone]  If 7PM-7AM, please contact night-coverage [email or web address] Password [facility] 08/22/2014, 1:37 PM   LOS: 2 days   HPI/Subjective: No events overnight.   Objective: Filed Vitals:   08/22/14 0700 08/22/14 0728 08/22/14 0832 08/22/14 1140  BP:  143/62  142/67  Pulse:  63  73  Temp: 97.3 F (36.3 C)   97.8 F (36.6 C)  TempSrc: Oral   Oral  Resp:  14  19  Height:      Weight:      SpO2:  99% 98% 99%    Intake/Output Summary (Last 24 hours) at 08/22/14 1337 Last data filed at 08/22/14 1100  Gross  per 24 hour  Intake    935 ml  Output   1950 ml  Net  -1015 ml    Exam:   General:  Pt is alert, follows commands appropriately, not in acute distress  Cardiovascular: Paced rhythm   Respiratory: Mild rhonchi at bases, diminished breath sounds at bases, no wheezing   Abdomen: Soft, non tender, non distended, bowel sounds present, no guarding  Extremities: No edema, pulses DP and PT palpable bilaterally  Neuro: Grossly nonfocal  Data Reviewed: Basic Metabolic Panel:  Recent Labs Lab 08/20/14 2155 08/21/14 0439 08/22/14 0307  NA 139 136 138  K 4.1 4.4 5.1  CL 103 104 107  CO2 $Re'25 21 24  'EQf$ GLUCOSE 138* 204* 148*  BUN $Re'23 21 20  'Epd$ CREATININE 1.25 1.28 0.98   CALCIUM 9.3 8.6 9.0   Liver Function Tests:  Recent Labs Lab 08/21/14 0439  AST 29  ALT 14  ALKPHOS 55  BILITOT 0.5  PROT 6.1  ALBUMIN 3.1*   CBC:  Recent Labs Lab 08/20/14 2155 08/21/14 0439 08/22/14 0307  WBC 21.6* 18.2* 24.4*  NEUTROABS  --  17.3*  --   HGB 13.0 10.8* 11.1*  HCT 39.7 33.2* 34.0*  MCV 92.5 93.3 93.4  PLT 235 188 190     Recent Results (from the past 240 hour(s))  MRSA PCR Screening     Status: None   Collection Time: 08/21/14  1:49 AM  Result Value Ref Range Status   MRSA by PCR NEGATIVE NEGATIVE Final     Scheduled Meds: . atorvastatin  10 mg Oral QPM  . azithromycin  500 mg Intravenous Q24H  . cefTRIAXone (ROCEPHIN)  IV  1 g Intravenous Q24H  . escitalopram  20 mg Oral Daily  . fentaNYL  25 mcg Transdermal Q72H  . ipratropium-albuterol  3 mL Nebulization QID  . methylPREDNISolone (SOLU-MEDROL) injection  40 mg Intravenous Q12H  . metoprolol succinate  12.5 mg Oral Daily  . warfarin  7.5 mg Oral ONCE-1800  . Warfarin - Pharmacist Dosing Inpatient   Does not apply q1800   Continuous Infusions:

## 2014-08-22 NOTE — Progress Notes (Signed)
CRITICAL VALUE ALERT  Critical value received:  Lactic Acid 4.5  Date of notification: 08/21/2014  Time of notification:  2000  Critical value read back: yes   Nurse who received alert:  Reita Chard Jeremie Giangrande, RN  MD notified (1st page):  Donnal Debar   Time of first page:  2000  MD notified (2nd page):  Time of second page:  Responding MD:  Donnal Debar  Time MD responded:  2005

## 2014-08-22 NOTE — Progress Notes (Signed)
NURSING PROGRESS NOTE  Peter Richards [id number] Transfer Data: 08/22/2014 4:54 PM Attending Provider: Theodis Blaze, MD [id number],YEB THOMAS, MD Code Status: Full  Peter Richards is a 81 y.o. male patient transferred from 3S15 -No acute distress noted.  -No complaints of shortness of breath.  -No complaints of chest pain.   Cardiac Monitoring: Box # 16 in place.   Blood pressure 142/64, pulse 72, temperature 97.2 F (36.2 C), temperature source Oral, resp. rate 20, height 5\' 8"  (1.727 m), weight 80.4 kg (177 lb 4 oz), SpO2 100 %.   IV Fluids:  IV in place, occlusive dsg intact without redness, IV cath antecubital right, condition patent and no redness none.    Allergies:  Codeine  Past Medical History:   has a past medical history of Hyperlipidemia; Orthostatic hypotension; Bradycardia; Syncope; Myocardial infarction; CAD (coronary artery disease); Hypertension; Stroke; GERD (gastroesophageal reflux disease); Headache(784.0); Arthritis; Aortic stenosis, mild; Chronic anticoagulation; Depression; AAA (abdominal aortic aneurysm); Heart attack; Pacemaker; History of cervical spinal surgery; HOH (hard of hearing); Prostate cancer; Basal cell adenocarcinoma; Bladder cancer; Attention to urostomy; Atrial fibrillation; Exertional shortness of breath; Chronic radicular pain of lower back; Anxiety; and COPD (chronic obstructive pulmonary disease).  Past Surgical History:   has past surgical history that includes Insert / replace / remove pacemaker; Prostatectomy; Abdominal aortic aneurysm repair (1999); Cataract extraction w/ intraocular lens  implant, bilateral; Appendectomy; Revision urostomy cutaneous; Revision urostomy cutaneous (2006); Vertebroplasty (08/22/2011); Cholecystectomy; Posterior lumbar fusion (11/06/2012); Coronary artery bypass graft (1999); Skin cancer excision; Bladder surgery; and Lumbar laminectomy/decompression microdiscectomy (Bilateral, 11/06/2012).  Social  History:   reports that he quit smoking about 36 years ago. His smoking use included Cigarettes. He has a 150 pack-year smoking history. His smokeless tobacco use includes Snuff. He reports that he does not drink alcohol or use illicit drugs.  Skin: urostomy   Patient/Family orientated to room. Information packet given to patient/family. Admission inpatient armband information verified with patient/family to include name and date of birth and placed on patient arm. Side rails up x 2, fall assessment and education completed with patient/family. Patient/family able to verbalize understanding of risk associated with falls and verbalized understanding to call for assistance before getting out of bed. Call light within reach. Patient/family able to voice and demonstrate understanding of unit orientation instructions.    Will continue to evaluate and treat per MD orders.

## 2014-08-22 NOTE — Progress Notes (Signed)
Report received from [facility] to transfer to [phone]

## 2014-08-22 NOTE — Progress Notes (Signed)
Pt to [place] to 5W-16, called report. Family notified of [place].

## 2014-08-23 ENCOUNTER — Inpatient Hospital Stay (HOSPITAL_COMMUNITY): Payer: Commercial Managed Care - HMO

## 2014-08-23 ENCOUNTER — Encounter: Payer: Self-pay | Admitting: Internal Medicine

## 2014-08-23 LAB — CBC
HEMATOCRIT: 36.2 % — AB (ref 39.0–52.0)
Hemoglobin: 11.6 g/dL — ABNORMAL LOW (ref 13.0–17.0)
MCH: 30.1 pg (ref 26.0–34.0)
MCHC: 32 g/dL (ref 30.0–36.0)
MCV: 94 fL (ref 78.0–100.0)
Platelets: 206 10*3/uL (ref 150–400)
RBC: 3.85 MIL/uL — ABNORMAL LOW (ref 4.22–5.81)
RDW: 15.5 % (ref 11.5–15.5)
WBC: 20.4 10*3/uL — ABNORMAL HIGH (ref 4.0–10.5)

## 2014-08-23 LAB — BASIC METABOLIC PANEL
ANION GAP: 6 (ref 5–15)
BUN: 23 mg/dL (ref 6–23)
CALCIUM: 9 mg/dL (ref 8.4–10.5)
CHLORIDE: 105 mmol/L (ref 96–112)
CO2: 28 mmol/L (ref 19–32)
Creatinine, Ser: 0.97 mg/dL (ref 0.50–1.35)
GFR calc Af Amer: 85 mL/min — ABNORMAL LOW (ref >90)
GFR, EST NON AFRICAN AMERICAN: 74 mL/min — AB (ref >90)
GLUCOSE: 115 mg/dL — AB (ref 70–99)
Potassium: 4.7 mmol/L (ref 3.5–5.1)
Sodium: 139 mmol/L (ref 135–145)

## 2014-08-23 LAB — LEGIONELLA ANTIGEN, URINE

## 2014-08-23 LAB — PROTIME-INR
INR: 1.68 — ABNORMAL HIGH (ref 0.00–1.49)
Prothrombin Time: 20 seconds — ABNORMAL HIGH (ref 11.6–15.2)

## 2014-08-23 LAB — LACTIC ACID, PLASMA: Lactic Acid, Venous: 3 mmol/L (ref 0.5–2.0)

## 2014-08-23 MED ORDER — IPRATROPIUM-ALBUTEROL 0.5-2.5 (3) MG/3ML IN SOLN
3.0000 mL | Freq: Two times a day (BID) | RESPIRATORY_TRACT | Status: DC
  Administered 2014-08-23 – 2014-08-24 (×2): 3 mL via RESPIRATORY_TRACT
  Filled 2014-08-23 (×2): qty 3

## 2014-08-23 MED ORDER — PREDNISONE 50 MG PO TABS
50.0000 mg | ORAL_TABLET | Freq: Every day | ORAL | Status: DC
  Administered 2014-08-23 – 2014-08-24 (×2): 50 mg via ORAL
  Filled 2014-08-23 (×3): qty 1

## 2014-08-23 MED ORDER — WARFARIN SODIUM 7.5 MG PO TABS
7.5000 mg | ORAL_TABLET | Freq: Once | ORAL | Status: AC
  Administered 2014-08-23: 7.5 mg via ORAL
  Filled 2014-08-23: qty 1

## 2014-08-23 MED ORDER — LEVOFLOXACIN 500 MG PO TABS
500.0000 mg | ORAL_TABLET | Freq: Every day | ORAL | Status: DC
  Administered 2014-08-23 – 2014-08-24 (×2): 500 mg via ORAL
  Filled 2014-08-23 (×2): qty 1

## 2014-08-23 NOTE — Evaluation (Signed)
Physical Therapy Evaluation Patient Details Name: Peter Richards MRN: [id number] DOB: 1929-06-02 Today's Date: 08/23/2014   History of Present Illness  Peter Richards is a 81 y.o. male with Past medical history of dyslipidemia, coronary artery disease, hypertension, TIA, GERD, COPD on 2 L of oxygen. Pt reports progressive dyspnea over past 3-4 wks.  Clinical Impression  Pt admitted with above diagnosis. Pt currently with functional limitations due to the deficits listed below (see PT Problem List). Pt desat to 86% on 2 L O2 with only 25' of ambulation with RW.  Pt will benefit from skilled PT to increase their independence and safety with mobility to allow discharge to the venue listed below.       Follow Up Recommendations No PT follow up    Equipment Recommendations  None recommended by PT    Recommendations for Other Services       Precautions / Restrictions Precautions Precautions: None Precaution Comments: ostomy x10 yrs Restrictions Weight Bearing Restrictions: No      Mobility  Bed Mobility Overal bed mobility: Independent                Transfers Overall transfer level: Needs assistance Equipment used: None Transfers: Sit to/from Stand Sit to Stand: Supervision            Ambulation/Gait Ambulation/Gait assistance: Min guard Ambulation Distance (Feet): 25 Feet Assistive device: Rolling walker (2 wheeled) Gait Pattern/deviations: Step-through pattern;Trunk flexed Gait velocity: decreased   General Gait Details: RW used for energy conservation. With only 25' of ambulation, O2 sats decreased to 85% on 2L O2 and pt fatigued. PTA, pt did not usually need O2 during the day, only at night. Poor tolerance for activity  Stairs            Wheelchair Mobility    Modified Rankin (Stroke Patients Only)       Balance Overall balance assessment: Needs assistance Sitting-balance support: No upper extremity supported Sitting balance-Leahy  Scale: Normal     Standing balance support: No upper extremity supported;During functional activity Standing balance-Leahy Scale: Good Standing balance comment: prone to balance issues due to forward head posture and hypoxia, could not assess further this visit due to x-ray transport arrived for chest x-ray                             Pertinent Vitals/Pain Pain Assessment: No/denies pain    Home Living Family/patient expects to be discharged to:: Private residence Living Arrangements: Spouse/significant other Available Help at Discharge: Family;Available 24 hours/day Type of Home: Apartment Home Access: Level entry     Home Layout: One level Home Equipment: Walker - 4 wheels;Cane - single point Additional Comments: Pt and his wife reside at [name] in independent living cottage    Prior Function Level of Independence: Independent with assistive device(s)         Comments: Wife cooks breakfast/lunch and they go to [employer] for dinner. pt states he uses a cane and furniture walks and was performing ADLs on his own     Hand Dominance   Dominant Hand: Right    Extremity/Trunk Assessment   Upper Extremity Assessment: Overall WFL for tasks assessed           Lower Extremity Assessment: Overall WFL for tasks assessed      Cervical / Trunk Assessment: Kyphotic  Communication   Communication: No difficulties  Cognition Arousal/Alertness: Awake/alert Behavior During Therapy: [facility]  for tasks assessed/performed Overall Cognitive Status: Within Functional Limits for tasks assessed                      General Comments General comments (skin integrity, edema, etc.): Discussed pt purchase of O2 sats monitor for home use and indications for calling physician    Exercises        Assessment/Plan    PT Assessment Patient needs continued PT services  PT Diagnosis Difficulty walking   PT Problem List Decreased activity  tolerance;Decreased balance;Decreased range of motion;Decreased mobility;Cardiopulmonary status limiting activity  PT Treatment Interventions DME instruction;Gait training;Functional mobility training;Therapeutic activities;Therapeutic exercise;Balance training;Patient/family education   PT Goals (Current goals can be found in the Care Plan section) Acute Rehab PT Goals Patient Stated Goal: return home tomorrow PT Goal Formulation: With patient Time For Goal Achievement: 09/06/14 Potential to Achieve Goals: Good    Frequency Min 3X/week   Barriers to discharge        Co-evaluation               End of Session Equipment Utilized During Treatment: Oxygen Activity Tolerance: Patient limited by fatigue Patient left: Other (comment) (w/c for x-ray) Nurse Communication: Mobility status         Time: 9485-4627 PT Time Calculation (min) (ACUTE ONLY): 16 min   Charges:   PT Evaluation $Initial PT Evaluation Tier I: 1 Procedure     PT G Codes:       Leighton Roach, PT  Acute Rehab Services  [place], [place] 08/23/2014, 2:03 PM

## 2014-08-23 NOTE — H&P (Deleted)
Patient saturating well at 94% on room air.  Returned patient to 2lpm [place] per information obtained by him that MD wanted him to wear 02 24/7.  MD to evaluate this AM.

## 2014-08-23 NOTE — Progress Notes (Signed)
Medicare Important Message given?  YES (If response is "NO", the following Medicare IM given date fields will be blank) Date Medicare IM given:  08/23/14 Medicare IM given by:  Anderson Coppock 

## 2014-08-23 NOTE — Progress Notes (Signed)
ANTICOAGULATION CONSULT NOTE - Follow up  Pharmacy Consult for Warfarin  Indication: Afib  Allergies  Allergen Reactions  . Codeine Nausea And Vomiting   Patient Measurements: ~75 kg  Vital Signs: Temp: 97.2 F (36.2 C) (03/21 0522) Temp Source: Oral (03/21 0522) BP: 161/78 mmHg (03/21 0522) Pulse Rate: 59 (03/21 0522)  Labs:  Recent Labs  08/21/14 0439 08/22/14 0307 08/23/14 0550  WBC 18.2* 24.4* 20.4*  HGB 10.8* 11.1* 11.6*  PLT 188 190 206  CREATININE 1.28 0.98 0.97   Medications: Warfarin home dose: 7.5mg  MWF, 5mg  all other days.   Assessment: 81 y/o M on warfarin PTA for afib. INR on admit was therapeutic at 2 but now subtherapeutic at 1.68 despite a boosted dose yesterday. No bleeding noted.  Also on azithromycin would could increase the INR.   Goal of Therapy:  INR 2-3  Plan:  - Repeat warfarin 7.5mg  PO x 1 tonight - Daily INR  Salome Arnt, PharmD, BCPS Pager # [phone] 08/23/2014 8:39 AM

## 2014-08-23 NOTE — Progress Notes (Signed)
Patient saturating well at 94% on room air. Returned patient to 2lpm  per information obtained by him that MD wanted him to wear 02 24/7. MD to evaluate this AM

## 2014-08-23 NOTE — Clinical Social Work Note (Signed)
CSW was told that patient is from [place]. Patient states he does live at [place] [facility], which will be handled as a home discharge. CSW has made RNCM aware. CSW signing off.    Liz Beach MSW, [place], [place], [phone]

## 2014-08-23 NOTE — Progress Notes (Signed)
Patient ID: CASWELL ALVILLAR, male   DOB: June 30, 1928, 81 y.o.   MRN: [id number]  [facility] PROGRESS NOTE  ORIE BAXENDALE EOF:[id number] DOB: March 02, 1929 DOA: 08/20/2014 PCP: Mathews Argyle, MD   Brief narrative:    81 y.o. male with HTN, HLD, CAD, COPD on 2 L oxygen, presented to [facility] ED with main concern of several weeks duration of progressively worsening dyspnea that initially started with exertion and has progressed to dyspnea at rest over the past 48 hours, associated with fevers, chills, productive cough of yellow sputum, malaise, poor oral intake. Pt denies any known sick contacts or exposures.  Assessment/Plan:    Principal Problem:   Acute and chronic respiratory failure with hypoxia in pt with oxygen dependent COPD - secondary to lobar PNA, LLL - pt clinically improving with no wheezing on exam and much less rhonchi, ambulating in hallway with no concerns  - continue Zithromax and Rocephin that was started in ED, day #3/7 - continue BD's scheduled and as needed    Sepsis  - criteria met on admission with tachycardia, RR 29 pbm, WBC 21.6, elevated lactic acid, source PNA LLL  - continue zithro and rocephin as noted above - lactic acid trending down, will repeat this AM to monitor trend  - WBC is trending down - pt remains afebrile over the past 24 hours   Active Problems:   PACEMAKER, PERMANENT - monitor on tele    A-fib - rate controlled this AM - continue Coumadin per pharmacy  - continue metoprolol   COPD - continue bronchodilators scheduled and as needed - since no wheezing on exam and rhonchi much improved, will d/c solumedrol and will place on Prednisone    Hx of bladder cancer - outpatient follow up    Interstitial lung disease  - BD as noted above with solumedrol      Acute drop in Hg - possibly from IVF - no signs of active bleeding, Hg overall stable  - repeat CBC in AM   Chronic diastolic CHF - per last 2 D ECHO 10/2013 - grade I  diastolic dysfunction - monitor daily weights, strict I's and O's - weight remains stable at 176 - 177 lbs   DVT prophylaxis - pt already on Coumadin   Code Status: Full.  Family Communication:  plan of care discussed with the patient Disposition Plan: Home when stable, possibly in AM, PT evaluation pending   IV access:  Peripheral IV  Procedures and diagnostic studies:    Dg Chest 2 View  08/20/2014    Interstitial lung disease. 2. Concern for superimposed pneumonia in the left lower lobe.    Medical Consultants:  None  Other Consultants:  None  IAnti-Infectives:   Zithromax 3/19 --> Rocephin 3/18 --> Vancomycin 3/18 --> 3/19   Faye Ramsay, MD  [facility] Pager [phone]  If 7PM-7AM, please contact night-coverage [email or web address] Password [facility] 08/23/2014, 11:50 AM   LOS: 3 days   HPI/Subjective: No events overnight.   Objective: Filed Vitals:   08/22/14 2122 08/22/14 2136 08/23/14 0522 08/23/14 0826  BP: 154/67  161/78   Pulse: 66 62 59   Temp: 97.9 F (36.6 C)  97.2 F (36.2 C)   TempSrc: Oral  Oral   Resp: $Remo'18 18 20   'SAQzO$ Height:      Weight:   79.9 kg (176 lb 2.4 oz)   SpO2: 97%  99% 94%    Intake/Output Summary (Last 24 hours) at 08/23/14 1150 Last data filed at 08/23/14 0900  Gross per 24 hour  Intake    600 ml  Output   1000 ml  Net   -400 ml    Exam:   General:  Pt is alert, follows commands appropriately, not in acute distress  Cardiovascular: Paced rhythm   Respiratory: Mild rhonchi at bases, diminished breath sounds at bases, no wheezing   Abdomen: Soft, non tender, non distended, bowel sounds present, no guarding  Extremities: No edema, pulses DP and PT palpable bilaterally  Neuro: Grossly nonfocal  Data Reviewed: Basic Metabolic Panel:  Recent Labs Lab 08/20/14 2155 08/21/14 0439 08/22/14 0307 08/23/14 0550  NA 139 136 138 139  K 4.1 4.4 5.1 4.7  CL 103 104 107 105  CO2 $Re'25 21 24 28  'eDZ$ GLUCOSE 138* 204* 148* 115*  BUN $Re'23 21  20 23  'Gjx$ CREATININE 1.25 1.28 0.98 0.97  CALCIUM 9.3 8.6 9.0 9.0   Liver Function Tests:  Recent Labs Lab 08/21/14 0439  AST 29  ALT 14  ALKPHOS 55  BILITOT 0.5  PROT 6.1  ALBUMIN 3.1*   CBC:  Recent Labs Lab 08/20/14 2155 08/21/14 0439 08/22/14 0307 08/23/14 0550  WBC 21.6* 18.2* 24.4* 20.4*  NEUTROABS  --  17.3*  --   --   HGB 13.0 10.8* 11.1* 11.6*  HCT 39.7 33.2* 34.0* 36.2*  MCV 92.5 93.3 93.4 94.0  PLT 235 188 190 206     Recent Results (from the past 240 hour(s))  MRSA PCR Screening     Status: None   Collection Time: 08/21/14  1:49 AM  Result Value Ref Range Status   MRSA by PCR NEGATIVE NEGATIVE Final     Scheduled Meds: . atorvastatin  10 mg Oral QPM  . escitalopram  20 mg Oral Daily  . fentaNYL  25 mcg Transdermal Q72H  . guaiFENesin  600 mg Oral BID  . ipratropium-albuterol  3 mL Nebulization BID  . levofloxacin  500 mg Oral Daily  . metoprolol succinate  12.5 mg Oral Daily  . predniSONE  50 mg Oral Q breakfast  . warfarin  7.5 mg Oral ONCE-1800  . Warfarin - Pharmacist Dosing Inpatient   Does not apply q1800   Continuous Infusions:

## 2014-08-23 NOTE — Progress Notes (Signed)
NCM spoke with patient, he states [facility] can transport him tomorrow back home, NCM called [facility] and they state they will have transportation waiting for patient at the [facility] at 2 pm on 3/22.  NCM informed RN and MD and CSW.

## 2014-08-24 LAB — BASIC METABOLIC PANEL
Anion gap: 10 (ref 5–15)
BUN: 24 mg/dL — ABNORMAL HIGH (ref 6–23)
CALCIUM: 9.2 mg/dL (ref 8.4–10.5)
CO2: 26 mmol/L (ref 19–32)
CREATININE: 0.97 mg/dL (ref 0.50–1.35)
Chloride: 103 mmol/L (ref 96–112)
GFR calc Af Amer: 85 mL/min — ABNORMAL LOW (ref >90)
GFR calc non Af Amer: 74 mL/min — ABNORMAL LOW (ref >90)
GLUCOSE: 104 mg/dL — AB (ref 70–99)
Potassium: 4.1 mmol/L (ref 3.5–5.1)
Sodium: 139 mmol/L (ref 135–145)

## 2014-08-24 LAB — CBC
HCT: 37.7 % — ABNORMAL LOW (ref 39.0–52.0)
Hemoglobin: 12.4 g/dL — ABNORMAL LOW (ref 13.0–17.0)
MCH: 30.2 pg (ref 26.0–34.0)
MCHC: 32.9 g/dL (ref 30.0–36.0)
MCV: 92 fL (ref 78.0–100.0)
Platelets: 218 10*3/uL (ref 150–400)
RBC: 4.1 MIL/uL — ABNORMAL LOW (ref 4.22–5.81)
RDW: 15.2 % (ref 11.5–15.5)
WBC: 16.1 10*3/uL — AB (ref 4.0–10.5)

## 2014-08-24 LAB — PROTIME-INR
INR: 1.89 — ABNORMAL HIGH (ref 0.00–1.49)
Prothrombin Time: 21.9 seconds — ABNORMAL HIGH (ref 11.6–15.2)

## 2014-08-24 MED ORDER — IPRATROPIUM-ALBUTEROL 0.5-2.5 (3) MG/3ML IN SOLN: 3.0000 mL | RESPIRATORY_TRACT | Status: AC | PRN

## 2014-08-24 MED ORDER — GUAIFENESIN ER 600 MG PO TB12: 600.0000 mg | ORAL_TABLET | Freq: Two times a day (BID) | ORAL | Status: DC

## 2014-08-24 MED ORDER — LEVOFLOXACIN 500 MG PO TABS: 500.0000 mg | ORAL_TABLET | Freq: Every day | ORAL | Status: DC

## 2014-08-24 MED ORDER — HYDROCODONE-ACETAMINOPHEN 5-325 MG PO TABS: 1.0000 | ORAL_TABLET | ORAL | Status: DC | PRN

## 2014-08-24 MED ORDER — PREDNISONE 10 MG PO TABS: ORAL_TABLET | ORAL | Status: DC

## 2014-08-24 NOTE — Progress Notes (Signed)
Patient given discharge instructions.  Patient verbalized understanding of discharge instructions.  Patient PIV removed, site clean, dry, and intact.    Filed Vitals:   08/24/14 0935  BP: 140/67  Pulse: 67  Temp:   Resp:      Medication List    TAKE these medications        atorvastatin 10 MG tablet  Commonly known as:  LIPITOR  Take 1 tablet by mouth every evening.     Calcium 500 MG Chew     diphenhydramine-acetaminophen 25-500 MG Tabs  Commonly known as:  TYLENOL PM  Take 1 tablet by mouth at bedtime as needed (sleep).     escitalopram 20 MG tablet  Commonly known as:  LEXAPRO  Take 20 mg by mouth daily.     EYE VITAMINS PO     fentaNYL 25 MCG/HR patch  Commonly known as:  DURAGESIC - dosed mcg/hr  Place 1 patch (25 mcg total) onto the skin every 3 (three) days.     guaiFENesin 600 MG 12 hr tablet  Commonly known as:  MUCINEX  Take 1 tablet (600 mg total) by mouth 2 (two) times daily.     HYDROcodone-acetaminophen 5-325 MG per tablet  Commonly known as:  NORCO/VICODIN  Take 1 tablet by mouth every 4 (four) hours as needed for severe pain.     ipratropium-albuterol 0.5-2.5 (3) MG/3ML Soln  Commonly known as:  DUONEB  Take 3 mLs by nebulization every 4 (four) hours as needed.     levofloxacin 500 MG tablet  Commonly known as:  LEVAQUIN  Take 1 tablet (500 mg total) by mouth daily.     metoprolol succinate 25 MG 24 hr tablet  Commonly known as:  TOPROL-XL  Take 0.5 tablets (12.5 mg total) by mouth daily.     nitroGLYCERIN 0.4 MG SL tablet  Commonly known as:  NITROSTAT  Place 0.4 mg under the tongue every 5 (five) minutes as needed for chest pain (MAX 3 TABLETS).     predniSONE 10 MG tablet  Commonly known as:  DELTASONE  Take 50 mg tablet today and taper down by 10 mg daily until completed     PRESCRIPTION MEDICATION  Wears 2 L of O2 at night     simvastatin 10 MG tablet  Commonly known as:  ZOCOR  Take 10 mg by mouth every evening.     tiotropium  18 MCG inhalation capsule  Commonly known as:  SPIRIVA  Place 1 capsule (18 mcg total) into inhaler and inhale daily as needed (for shortness of breath).     VOLTAREN 1 % Gel  Generic drug:  diclofenac sodium  Apply 2 g topically daily as needed (pain).     warfarin 5 MG tablet  Commonly known as:  COUMADIN  Take 5-7.5 mg by mouth every evening. Take 7.5mg  on Monday, Wednesday and Friday. Take 5mg  all other days of the week.     zolpidem 5 MG tablet  Commonly known as:  AMBIEN  Take 1 tablet (5 mg total) by mouth at bedtime as needed for sleep.      patient escorted via wheelchair to awaiting transportation.  Forrestine Him, RN 08/24/14 at [phone]

## 2014-08-24 NOTE — Discharge Instructions (Signed)
Pneumonia Pneumonia is an infection of the lungs.  CAUSES Pneumonia may be caused by bacteria or a virus. Usually, these infections are caused by breathing infectious particles into the lungs (respiratory tract). SIGNS AND SYMPTOMS   Cough.  Fever.  Chest pain.  Increased rate of breathing.  Wheezing.  Mucus production. DIAGNOSIS  If you have the common symptoms of pneumonia, your health care provider will typically confirm the diagnosis with a chest X-ray. The X-ray will show an abnormality in the lung (pulmonary infiltrate) if you have pneumonia. Other tests of your blood, urine, or sputum may be done to find the specific cause of your pneumonia. Your health care provider may also do tests (blood gases or pulse oximetry) to see how well your lungs are working. TREATMENT  Some forms of pneumonia may be spread to other people when you cough or sneeze. You may be asked to wear a mask before and during your exam. Pneumonia that is caused by bacteria is treated with antibiotic medicine. Pneumonia that is caused by the influenza virus may be treated with an antiviral medicine. Most other viral infections must run their course. These infections will not respond to antibiotics.  HOME CARE INSTRUCTIONS   Cough suppressants may be used if you are losing too much rest. However, coughing protects you by clearing your lungs. You should avoid using cough suppressants if you can.  Your health care provider may have prescribed medicine if he or she thinks your pneumonia is caused by bacteria or influenza. Finish your medicine even if you start to feel better.  Your health care provider may also prescribe an expectorant. This loosens the mucus to be coughed up.  Take medicines only as directed by your health care provider.  Do not smoke. Smoking is a common cause of bronchitis and can contribute to pneumonia. If you are a smoker and continue to smoke, your cough may last several weeks after  your pneumonia has cleared.  A cold steam vaporizer or humidifier in your room or home may help loosen mucus.  Coughing is often worse at night. Sleeping in a semi-upright position in a recliner or using a couple pillows under your head will help with this.  Get rest as you feel it is needed. Your body will usually let you know when you need to rest. PREVENTION A pneumococcal shot (vaccine) is available to prevent a common bacterial cause of pneumonia. This is usually suggested for:  People over 63 years old.  Patients on chemotherapy.  People with chronic lung problems, such as bronchitis or emphysema.  People with immune system problems. If you are over 65 or have a high risk condition, you may receive the pneumococcal vaccine if you have not received it before. In some countries, a routine influenza vaccine is also recommended. This vaccine can help prevent some cases of pneumonia.You may be offered the influenza vaccine as part of your care. If you smoke, it is time to quit. You may receive instructions on how to stop smoking. Your health care provider can provide medicines and counseling to help you quit. SEEK MEDICAL CARE IF: You have a fever. SEEK IMMEDIATE MEDICAL CARE IF:   Your illness becomes worse. This is especially true if you are elderly or weakened from any other disease.  You cannot control your cough with suppressants and are losing sleep.  You begin coughing up blood.  You develop pain which is getting worse or is uncontrolled with medicines.  Any of  the symptoms which initially brought you in for treatment are getting worse rather than better.  You develop shortness of breath or chest pain. MAKE SURE YOU:   Understand these instructions.  Will watch your condition.  Will get help right away if you are not doing well or get worse. Document Released: 05/21/2005 Document Revised: 10/05/2013 Document Reviewed: 08/10/2010 [facility] Patient Information 2015  [place], [place]. This information is not intended to replace advice given to you by your health care provider. Make sure you discuss any questions you have with your health care provider. Information on my medicine - Coumadin   (Warfarin)  This medication education was reviewed with me or my healthcare representative as part of my discharge preparation.  The pharmacist that spoke with me during my hospital stay was:    Why was Coumadin prescribed for you? Coumadin was prescribed for you because you have a blood clot or a medical condition that can cause an increased risk of forming blood clots. Blood clots can cause serious health problems by blocking the flow of blood to the heart, lung, or brain. Coumadin can prevent harmful blood clots from forming. As a reminder your indication for Coumadin is:   Stroke Prevention Because Of Atrial Fibrillation  What test will check on my response to Coumadin? While on Coumadin (warfarin) you will need to have an INR test regularly to ensure that your dose is keeping you in the desired range. The INR (international normalized ratio) number is calculated from the result of the laboratory test called prothrombin time (PT).  If an INR APPOINTMENT HAS NOT ALREADY BEEN MADE FOR YOU please schedule an appointment to have this lab work done by your health care provider within 7 days. Your INR goal is usually a number between:  2 to 3 or your provider may give you a more narrow range like 2-2.5.  Ask your health care provider during an office visit what your goal INR is.  What  do you need to  know  About  COUMADIN? Take Coumadin (warfarin) exactly as prescribed by your healthcare provider about the same time each day.  DO NOT stop taking without talking to the doctor who prescribed the medication.  Stopping without other blood clot prevention medication to take the place of Coumadin may increase your risk of developing a new clot or stroke.  Get refills before you run  out.  What do you do if you miss a dose? If you miss a dose, take it as soon as you remember on the same day then continue your regularly scheduled regimen the next day.  Do not take two doses of Coumadin at the same time.  Important Safety Information A possible side effect of Coumadin (Warfarin) is an increased risk of bleeding. You should call your healthcare provider right away if you experience any of the following: ? Bleeding from an injury or your nose that does not stop. ? Unusual colored urine (red or dark brown) or unusual colored stools (red or black). ? Unusual bruising for unknown reasons. ? A serious fall or if you hit your head (even if there is no bleeding).  Some foods or medicines interact with Coumadin (warfarin) and might alter your response to warfarin. To help avoid this: ? Eat a balanced diet, maintaining a consistent amount of Vitamin K. ? Notify your provider about major diet changes you plan to make. ? Avoid alcohol or limit your intake to 1 drink for women and 2 drinks for  men per day. (1 drink is 5 oz. wine, 12 oz. beer, or 1.5 oz. liquor.)  Make sure that ANY health care provider who prescribes medication for you knows that you are taking Coumadin (warfarin).  Also make sure the healthcare provider who is monitoring your Coumadin knows when you have started a new medication including herbals and non-prescription products.  Coumadin (Warfarin)  Major Drug Interactions  Increased Warfarin Effect Decreased Warfarin Effect  Alcohol (large quantities) Antibiotics (esp. Septra/Bactrim, Flagyl, Cipro) Amiodarone (Cordarone) Aspirin (ASA) Cimetidine (Tagamet) Megestrol (Megace) NSAIDs (ibuprofen, naproxen, etc.) Piroxicam (Feldene) Propafenone (Rythmol SR) Propranolol (Inderal) Isoniazid (INH) Posaconazole (Noxafil) Barbiturates (Phenobarbital) Carbamazepine (Tegretol) Chlordiazepoxide (Librium) Cholestyramine (Questran) Griseofulvin Oral  Contraceptives Rifampin Sucralfate (Carafate) Vitamin K   Coumadin (Warfarin) Major Herbal Interactions  Increased Warfarin Effect Decreased Warfarin Effect  Garlic Ginseng Ginkgo biloba Coenzyme Q10 Green tea St. Johns wort    Coumadin (Warfarin) FOOD Interactions  Eat a consistent number of servings per week of foods HIGH in Vitamin K (1 serving =  cup)  Collards (cooked, or boiled & drained) Kale (cooked, or boiled & drained) Mustard greens (cooked, or boiled & drained) Parsley *serving size only =  cup Spinach (cooked, or boiled & drained) Swiss chard (cooked, or boiled & drained) Turnip greens (cooked, or boiled & drained)  Eat a consistent number of servings per week of foods MEDIUM-HIGH in Vitamin K (1 serving = 1 cup)  Asparagus (cooked, or boiled & drained) Broccoli (cooked, boiled & drained, or raw & chopped) Brussel sprouts (cooked, or boiled & drained) *serving size only =  cup Lettuce, raw (green leaf, endive, romaine) Spinach, raw Turnip greens, raw & chopped   These websites have more information on Coumadin (warfarin):  FailFactory.se; VeganReport.com.au;

## 2014-08-24 NOTE — Progress Notes (Signed)
NCM spoke with patient , he chose [facility] for [place], referral made to [facility] , [place] notified.  Soc will begin 24-48 hrs post dc.

## 2014-08-24 NOTE — Discharge Summary (Signed)
Physician Discharge Summary  Peter Richards VVO:[id number] DOB: 1929-04-21 DOA: 08/20/2014  PCP: Mathews Argyle, MD  Admit date: 08/20/2014 Discharge date: 08/24/2014  Recommendations for Outpatient Follow-up:  1. Pt will need to follow up with PCP in 2 weeks post discharge 2. Please obtain BMP to evaluate electrolytes and kidney function 3. Please also check CBC to evaluate Hg and Hct levels 4. Please note that pt was discharged on Levaquin and Prednisone taper pack to complete therapy for PNA   Discharge Diagnoses:  Principal Problem:   Acute and chronic respiratory failure with hypoxia Active Problems:   PACEMAKER, PERMANENT   A-fib   Hx of bladder cancer   Interstitial lung disease   COPD (chronic obstructive pulmonary disease)   CAP (community acquired pneumonia)   Discharge Condition: Stable  Diet recommendation: Heart healthy diet discussed in details    Brief narrative:    81 y.o. male with HTN, HLD, CAD, COPD on 2 L oxygen, presented to [facility] ED with main concern of several weeks duration of progressively worsening dyspnea that initially started with exertion and has progressed to dyspnea at rest over the past 48 hours, associated with fevers, chills, productive cough of yellow sputum, malaise, poor oral intake. Pt denies any known sick contacts or exposures.  Assessment/Plan:    Principal Problem:  Acute and chronic respiratory failure with hypoxia in pt with oxygen dependent COPD - secondary to lobar PNA, LLL - pt clinically improving with no wheezing on exam and much less rhonchi, ambulating in hallway with no concerns  - continued Zithromax and Rocephin for 4 days and transitioned to oral Levaquin upon discharge  - continue BD's    Sepsis  - criteria met on admission with tachycardia, RR 29 pbm, WBC 21.6, elevated lactic acid, source PNA LLL  - ABX as noted above  - lactic acid trending down - WBC is trending down - pt remains afebrile over  the past 24 hours   Active Problems:  PACEMAKER, PERMANENT - no events on tele   A-fib - rate controlled  - continue Coumadin  - continue metoprolol  COPD - continue bronchodilators - continue Prednisone tapering   Hx of bladder cancer - outpatient follow up   Interstitial lung disease  - BD as noted above with steroids   Acute drop in Hg - possibly from IVF - no signs of active bleeding, Hg overall stable   Chronic diastolic CHF - per last 2 D ECHO 10/2013 - grade I diastolic dysfunction - monitor daily weights, strict I's and O's - weight remained stable at 176 - 177 lbs    Code Status: Full.  Family Communication: plan of care discussed with the patient Disposition Plan: Retirement community [facility]   IV access:  Peripheral IV  Procedures and diagnostic studies:   Dg Chest 2 View 08/20/2014 Interstitial lung disease. 2. Concern for superimposed pneumonia in the left lower lobe.   Medical Consultants:  None  Other Consultants:  None  IAnti-Infectives:   Zithromax 3/19 --> 3/22 Rocephin 3/18 --> 3/22 Vancomycin 3/18 --> 3/19 Levaquin 3/22 -->         Discharge Exam: Filed Vitals:   08/24/14 0935  BP: 140/67  Pulse: 67  Temp:   Resp:    Filed Vitals:   08/24/14 0620 08/24/14 0656 08/24/14 0805 08/24/14 0935  BP:  178/81  140/67  Pulse:    67  Temp:  97.5 F (36.4 C)    TempSrc:  Oral    Resp:  13    Height:      Weight: 78.4 kg (172 lb 13.5 oz)     SpO2:  97% 98% 98%    General: Pt is alert, follows commands appropriately, not in acute distress Cardiovascular: Paced rhythm, S1/S2 +, no rubs, no gallops Respiratory: Clear to auscultation bilaterally, mild rhonchi at bases  Abdominal: Soft, non tender, non distended, bowel sounds +, no guarding Extremities: no cyanosis, pulses palpable bilaterally DP and PT Neuro: Grossly nonfocal  Discharge Instructions  Discharge Instructions    Diet - low sodium  heart healthy    Complete by:  As directed      Increase activity slowly    Complete by:  As directed             Medication List    TAKE these medications        atorvastatin 10 MG tablet  Commonly known as:  LIPITOR  Take 1 tablet by mouth every evening.     Calcium 500 MG Chew     diphenhydramine-acetaminophen 25-500 MG Tabs  Commonly known as:  TYLENOL PM  Take 1 tablet by mouth at bedtime as needed (sleep).     escitalopram 20 MG tablet  Commonly known as:  LEXAPRO  Take 20 mg by mouth daily.     EYE VITAMINS PO     fentaNYL 25 MCG/HR patch  Commonly known as:  DURAGESIC - dosed mcg/hr  Place 1 patch (25 mcg total) onto the skin every 3 (three) days.     guaiFENesin 600 MG 12 hr tablet  Commonly known as:  MUCINEX  Take 1 tablet (600 mg total) by mouth 2 (two) times daily.     HYDROcodone-acetaminophen 5-325 MG per tablet  Commonly known as:  NORCO/VICODIN  Take 1 tablet by mouth every 4 (four) hours as needed for severe pain.     ipratropium-albuterol 0.5-2.5 (3) MG/3ML Soln  Commonly known as:  DUONEB  Take 3 mLs by nebulization every 4 (four) hours as needed.     levofloxacin 500 MG tablet  Commonly known as:  LEVAQUIN  Take 1 tablet (500 mg total) by mouth daily.     metoprolol succinate 25 MG 24 hr tablet  Commonly known as:  TOPROL-XL  Take 0.5 tablets (12.5 mg total) by mouth daily.     nitroGLYCERIN 0.4 MG SL tablet  Commonly known as:  NITROSTAT  Place 0.4 mg under the tongue every 5 (five) minutes as needed for chest pain (MAX 3 TABLETS).     predniSONE 10 MG tablet  Commonly known as:  DELTASONE  Take 50 mg tablet today and taper down by 10 mg daily until completed     PRESCRIPTION MEDICATION  Wears 2 L of O2 at night     simvastatin 10 MG tablet  Commonly known as:  ZOCOR  Take 10 mg by mouth every evening.     tiotropium 18 MCG inhalation capsule  Commonly known as:  SPIRIVA  Place 1 capsule (18 mcg total) into inhaler and inhale  daily as needed (for shortness of breath).     VOLTAREN 1 % Gel  Generic drug:  diclofenac sodium  Apply 2 g topically daily as needed (pain).     warfarin 5 MG tablet  Commonly known as:  COUMADIN  Take 5-7.5 mg by mouth every evening. Take 7.$RemoveBeforeD'5mg'LhoheyIYDQgCdw$  on Monday, Wednesday and Friday. Take $RemoveBefo'5mg'gwsFVwLzDax$  all other days of the week.     zolpidem 5 MG tablet  Commonly  known as:  AMBIEN  Take 1 tablet (5 mg total) by mouth at bedtime as needed for sleep.           Follow-up Information    Follow up with Faye Ramsay, MD.   Specialty:  Internal Medicine   Why:  call my cell phone [phone]   Contact information:   [address] [place] [address] [phone]        The results of significant diagnostics from this hospitalization (including imaging, microbiology, ancillary and laboratory) are listed below for reference.     Microbiology: Recent Results (from the past 240 hour(s))  Blood culture (routine x 2)     Status: None (Preliminary result)   Collection Time: 08/20/14 10:40 PM  Result Value Ref Range Status   Specimen Description BLOOD ARM RIGHT  Final   Special Requests BOTTLES DRAWN AEROBIC AND ANAEROBIC 5CC  Final   Culture   Final           BLOOD CULTURE RECEIVED NO GROWTH TO DATE CULTURE WILL BE HELD FOR 5 DAYS BEFORE ISSUING A FINAL NEGATIVE REPORT Performed at [facility]    Report Status PENDING  Incomplete  Blood culture (routine x 2)     Status: None (Preliminary result)   Collection Time: 08/20/14 10:47 PM  Result Value Ref Range Status   Specimen Description BLOOD HAND RIGHT  Final   Special Requests BOTTLES DRAWN AEROBIC AND ANAEROBIC 5CC  Final   Culture   Final           BLOOD CULTURE RECEIVED NO GROWTH TO DATE CULTURE WILL BE HELD FOR 5 DAYS BEFORE ISSUING A FINAL NEGATIVE REPORT Performed at [facility]    Report Status PENDING  Incomplete  MRSA PCR Screening     Status: None   Collection Time: 08/21/14  1:49  AM  Result Value Ref Range Status   MRSA by PCR NEGATIVE NEGATIVE Final    Comment:        The GeneXpert MRSA Assay (FDA approved for NASAL specimens only), is one component of a comprehensive MRSA colonization surveillance program. It is not intended to diagnose MRSA infection nor to guide or monitor treatment for MRSA infections.      Labs: Basic Metabolic Panel:  Recent Labs Lab 08/20/14 2155 08/21/14 0439 08/22/14 0307 08/23/14 0550 08/24/14 0624  NA 139 136 138 139 139  K 4.1 4.4 5.1 4.7 4.1  CL 103 104 107 105 103  CO2 $Re'25 21 24 28 26  'TCm$ GLUCOSE 138* 204* 148* 115* 104*  BUN $Re'23 21 20 23 'Lmd$ 24*  CREATININE 1.25 1.28 0.98 0.97 0.97  CALCIUM 9.3 8.6 9.0 9.0 9.2   Liver Function Tests:  Recent Labs Lab 08/21/14 0439  AST 29  ALT 14  ALKPHOS 55  BILITOT 0.5  PROT 6.1  ALBUMIN 3.1*   CBC:  Recent Labs Lab 08/20/14 2155 08/21/14 0439 08/22/14 0307 08/23/14 0550 08/24/14 0624  WBC 21.6* 18.2* 24.4* 20.4* 16.1*  NEUTROABS  --  17.3*  --   --   --   HGB 13.0 10.8* 11.1* 11.6* 12.4*  HCT 39.7 33.2* 34.0* 36.2* 37.7*  MCV 92.5 93.3 93.4 94.0 92.0  PLT 235 188 190 206 218    BNP (last 3 results)  Recent Labs  08/20/14 2155  BNP 49.4     SIGNED: Time coordinating discharge: Over 30 minutes  Faye Ramsay, MD  [facility] 08/24/2014, 9:56 AM Pager [phone]  If  7PM-7AM, please contact night-coverage [email or web address] Password [email or web address]

## 2014-08-24 NOTE — Care Management Note (Addendum)
    Page 1 of 2   08/25/2014     5:50:22 PM CARE MANAGEMENT NOTE 08/25/2014  Patient:  Peter Richards, Peter Richards   Account Number:  [id number]  Date Initiated:  08/23/2014  Documentation initiated by:  Tomi Bamberger  Subjective/Objective Assessment:   dx resp distress  admit- from [facility]. Has home oxygen with [employer].  wife changes ostomy pounch.     Action/Plan:   pt eval- no f/u   Anticipated DC Date:  08/24/2014   Anticipated DC Plan:  [place]  CM consult      [facility] Choice  HOME HEALTH   Choice offered to / List presented to:  C-1 Patient   DME arranged  NEBULIZER MACHINE      DME agency  [place] arranged  HH-1 RN  [place] agency  [place]   Status of service:  Completed, signed off Medicare Important Message given?  YES (If response is "NO", the following Medicare IM given date fields will be blank) Date Medicare IM given:  08/23/2014 Medicare IM given by:  Tomi Bamberger Date Additional Medicare IM given:   Additional Medicare IM given by:    Discharge Disposition:  HOME/SELF CARE  Per UR Regulation:  Reviewed for med. necessity/level of care/duration of stay  If discussed at [place] of Stay Meetings, dates discussed:    Comments:  08/24/14 [place] [phone] patient chose [facility] for HHPT/HHRN since he had them in the past and also chose [employer] for neb machine since he has oxygen with [employer].  Referral made to [place] notified, and neb information faxed out to [facility] to deliver to patient's home.  08/23/14 [place] RN, BSN [phone] patient will be dc tomorrow after change to po steroids, trying to get wbc down as well.  NCM spoke with [facility] and they will be here on 3/22 to transport patient home at 2 pm.  NCM informed floor RN and MD.

## 2014-08-27 LAB — CULTURE, BLOOD (ROUTINE X 2)
CULTURE: NO GROWTH
Culture: NO GROWTH

## 2014-09-02 ENCOUNTER — Other Ambulatory Visit: Payer: Self-pay | Admitting: Geriatric Medicine

## 2014-09-02 ENCOUNTER — Ambulatory Visit
Admission: RE | Admit: 2014-09-02 | Discharge: 2014-09-02 | Disposition: A | Discharge status: Home / Self Care | Payer: Commercial Managed Care - HMO | Source: Ambulatory Visit | Attending: Geriatric Medicine | Admitting: Geriatric Medicine

## 2014-09-02 DIAGNOSIS — J189 Pneumonia, unspecified organism: Secondary | ICD-10-CM

## 2014-09-08 ENCOUNTER — Ambulatory Visit (INDEPENDENT_AMBULATORY_CARE_PROVIDER_SITE_OTHER): Payer: Commercial Managed Care - HMO | Admitting: Cardiology

## 2014-09-08 DIAGNOSIS — I480 Paroxysmal atrial fibrillation: Secondary | ICD-10-CM

## 2014-09-08 LAB — POCT INR: INR: 2.7

## 2014-09-13 ENCOUNTER — Ambulatory Visit (INDEPENDENT_AMBULATORY_CARE_PROVIDER_SITE_OTHER): Payer: Commercial Managed Care - HMO | Admitting: Internal Medicine

## 2014-09-13 DIAGNOSIS — I480 Paroxysmal atrial fibrillation: Secondary | ICD-10-CM

## 2014-09-13 DIAGNOSIS — Z5181 Encounter for therapeutic drug level monitoring: Secondary | ICD-10-CM

## 2014-09-13 LAB — POCT INR: INR: 2.4

## 2014-09-29 ENCOUNTER — Ambulatory Visit (INDEPENDENT_AMBULATORY_CARE_PROVIDER_SITE_OTHER): Payer: Commercial Managed Care - HMO | Admitting: Cardiovascular Disease

## 2014-09-29 DIAGNOSIS — I480 Paroxysmal atrial fibrillation: Secondary | ICD-10-CM

## 2014-09-29 DIAGNOSIS — Z5181 Encounter for therapeutic drug level monitoring: Secondary | ICD-10-CM

## 2014-09-29 LAB — POCT INR: INR: 2.4

## 2014-09-30 ENCOUNTER — Ambulatory Visit (INDEPENDENT_AMBULATORY_CARE_PROVIDER_SITE_OTHER): Payer: Commercial Managed Care - HMO | Admitting: *Deleted

## 2014-09-30 DIAGNOSIS — I480 Paroxysmal atrial fibrillation: Secondary | ICD-10-CM

## 2014-09-30 NOTE — Progress Notes (Signed)
Remote pacemaker transmission.   

## 2014-10-08 LAB — CUP PACEART REMOTE DEVICE CHECK
Battery Impedance: 155 Ohm
Brady Statistic AP VP Percent: 0 %
Brady Statistic AP VS Percent: 81 %
Brady Statistic AS VS Percent: 19 %
Date Time Interrogation Session: 20160428142307
Lead Channel Impedance Value: 464 Ohm
Lead Channel Pacing Threshold Amplitude: 0.625 V
Lead Channel Pacing Threshold Pulse Width: 0.4 ms
Lead Channel Sensing Intrinsic Amplitude: 8 mV
Lead Channel Setting Pacing Pulse Width: 1 ms
MDC IDC MSMT BATTERY REMAINING LONGEVITY: 129 mo
MDC IDC MSMT BATTERY VOLTAGE: 2.79 V
MDC IDC MSMT LEADCHNL RV IMPEDANCE VALUE: 646 Ohm
MDC IDC SET LEADCHNL RA PACING AMPLITUDE: 2 V
MDC IDC SET LEADCHNL RV PACING AMPLITUDE: 2.5 V
MDC IDC SET LEADCHNL RV SENSING SENSITIVITY: 4 mV
MDC IDC STAT BRADY AS VP PERCENT: 0 %

## 2014-10-12 ENCOUNTER — Ambulatory Visit (INDEPENDENT_AMBULATORY_CARE_PROVIDER_SITE_OTHER): Payer: Commercial Managed Care - HMO | Admitting: Emergency Medicine

## 2014-10-12 ENCOUNTER — Encounter: Payer: Self-pay | Admitting: Emergency Medicine

## 2014-10-12 VITALS — BP 100/66 | HR 87 | Ht 68.0 in | Wt 169.0 lb

## 2014-10-12 DIAGNOSIS — J849 Interstitial pulmonary disease, unspecified: Secondary | ICD-10-CM

## 2014-10-12 DIAGNOSIS — J42 Unspecified chronic bronchitis: Secondary | ICD-10-CM

## 2014-10-12 MED ORDER — LEVOFLOXACIN 500 MG PO TABS: 500.0000 mg | ORAL_TABLET | Freq: Every day | ORAL | Status: DC

## 2014-10-12 NOTE — Addendum Note (Signed)
Addended by: Desmond Dike C on: 10/12/2014 04:37 PM   Modules accepted: Orders

## 2014-10-12 NOTE — Assessment & Plan Note (Signed)
He does have some degree of COPD although he has not benefited from [employer]. He still has not started Spiriva on a daily basis and I would like to do this. He queries whether he may still have some residual pneumonia since he significantly worsened when he was hospitalized for this. I doubt that this is the case unless he has some scar that is concluding to his interstitial lung disease. All the same treat him for one week with Levaquin to settle the question.

## 2014-10-12 NOTE — Assessment & Plan Note (Signed)
I believe that his interstitial lung disease is driving most of his symptoms given the fact that he has not benefited significantly from bronchodilators. He takes DuoNeb without significant improvement in his breathing. We need to ensure that he is adequately oxygenated and we will perform a walking oximetry today

## 2014-10-12 NOTE — Progress Notes (Signed)
Subjective:    Patient ID: Peter Richards, male    DOB: 1929-05-11, 81 y.o.   MRN: [id number]  HPI 81 yo man, former smoker (150 pk-yrs), hx CAD/CABG ('02), A Fib on amiodarone for the last 4 years, was stopped 1 month ago, followed by Dr Mare Ferrari. He has been experiencing progressive exertional SOB for over 3 months such that he had to stop to rest with walking about 75 feet. He has also had some exertional CP that has responded to NTG, but the episodes of dyspnea haven't always occurred at same time as the CP. He has a cough, bothering him for about a year. He hears wheezing at times, seems to come at random, not necessarily with exertion.  Hurts sometimes to take a deep breath. Denies any sx or RA, SLE, etc.   ROV 06/10/12 -- follows up for dyspnea, presumed to be multifactorial. He has ILD on CT scan chest as well as obstruction on PFT. We started Spiriva last time to see if he would benefit >> he believes that he is less SOB. He hasn't exerted significantly, but his usual daily activities are easier. His wheeze is better. Last time he desaturated with ambulation, but wanted to defer. He has intermittent aspiration sx >> swallow precautions recommended 04/2011: 1. Consider esophageal assessment 2. Regular textures & Thin liquids 3. Compensations: Hard cough after swallow 4. Upright 30-60 min after meal;Seated upright 90 degrees  ROV 12/11/13 -- follow up visit for mixed disease > COPD + ILD on CT scan, suspected related to aspiration and silica exposure. He has been experiencing more dyspnea over the last 2-3 months. He has a new persistent cough associated with mid-chest discomfort. This is mid-sternal, intermittent. Has been associated with exertion, with his cough. No change with meals. He sees Dr Mare Ferrari for A fib / pacer, but has not talked to him about the CP. Pt has only taking Spiriva prn, about every week.  >stop Spiriva  Follow up 01/04/14 --  Pt was seen last month for follow up for  COPD and ILD(suspected aspiration and silica exposure)  Repeat CT chest done on 7/22 showed progressed ILD changes .  We reveiwed these results in detail and questions were answered .  Previous PFT showed mixed disease in 2013.  Says he gets winded with walking and has to stop and rest.  Today in office ambulatory walk showed drop in sats 85% on RA. At rest 92% on RA.  We discussed his option and he agrees to begin O2 with activity and. At bedtime .  He remains on Spiriva daily .  No hemoptysis ,orthopnea, exertional chest pain , edema or fever. No flare of cough.   ROV 02/03/14 -- follow up for ILD and COPD. PFT done 9/2 > mixed disease, FEV1 67% pred. We had attempted stopping Spiriva at one point. He hasn't seen Dr Mare Ferrari yet about his CP > happens with exertion, whenever he is dyspneic. He started O2 last month > believes it has helped his dyspnea and CP.  He stopped taking the spiriva last month when he started the oxygen.  He uses prn and rarely.  He was on nexium for a week for cough.> stopped it, wonders about restarting.   ROV 07/13/14 -- follows for his hx ILD and COPD. Last time we added back Spiriva once a day, but he is only using occasionally prn.  Was concerned that he was having exertional chest pain. He has followed with [name] and a  stress test showed no reversible ischemia in 03/03/14. He continues to have CP with a deep breath, not necessarily exertion. He is using O2 at night, but using during the day prn.   ROV 10/12/14 -- follow-up for interstitial lung disease and COPD. Her last visit we discussed taking his Spiriva every day reliably. He He has stable CP with a deep breathing, his exertional SOB is worse than last visit.  He is on 2L/min at all times (? Whether this is enough). He takes albuterol (? Duonebs) about once a day, doesn't notice much benefit.      Objective:   Physical Exam Filed Vitals:   10/12/14 1555 10/12/14 1556  BP:  100/66  Pulse:  87  Height: 5'  8" (1.727 m)   Weight: 169 lb (76.658 kg)   SpO2:  97%    Gen: Pleasant, well-nourished, in no distress,  normal affect  ENT: No lesions,  mouth clear,  oropharynx clear, no postnasal drip  Neck: No JVD, no TMG, no carotid bruits  Lungs: No use of accessory muscles, distant, B insp crackles  Cardiovascular: RRR, heart sounds normal, no murmur or gallops, no peripheral edema  Musculoskeletal: No deformities, no cyanosis or clubbing,   Neuro: alert, non focal  Skin: Warm, no lesions or rashes   Data:  PFT were performed  04/09/12>> show mixed disease, no BD response, restricted volumes, decreased DLCO that corrects for Va Repeated 02/03/14 >> mixed disease, restricted volumes, decreased DLCO   CT chest 12/23/13 >Mediastinal lymph nodes measure up to 1 mm in AP window. There is a  larger lymph node in the lower right paratracheal station which is  predominantly made up of a fatty hilum. Hilar regions are difficult  to definitively evaluate without IV contrast. No axillary  adenopathy. Atherosclerotic calcification of the arterial  vasculature. Heart is at the upper limits of normal in size. No  pericardial effusion.  There is a peripheral and basilar predominant pattern of subpleural  reticulation, traction bronchiectasis/bronchiolectasis and mild  associated architectural distortion. There may be honeycombing in  the lower lobes. Findings have progressed in the lung bases when  compared with 04/11/2012. No air trapping. No pleural fluid. Debris  is seen dependently in the airway.  Incidental imaging of the upper abdomen shows the visualized  portions of the liver, adrenal glands, kidneys, spleen, pancreas and  stomach to be grossly unremarkable. No upper abdominal adenopathy.  No worrisome lytic or sclerotic lesions. Degenerative changes are  seen in the spine.  IMPRESSION:  Peripheral and basilar predominant pattern of subpleural  reticulation, traction bronchiectasis/  bronchiolectasis and  associated architectural distortion. Findings have progressed from  04/11/2012 and indicative of usual interstitial pneumonitis (UIP).      Assessment & Plan:  Interstitial lung disease I believe that his interstitial lung disease is driving most of his symptoms given the fact that he has not benefited significantly from bronchodilators. He takes DuoNeb without significant improvement in his breathing. We need to ensure that he is adequately oxygenated and we will perform a walking oximetry today   COPD (chronic obstructive pulmonary disease) He does have some degree of COPD although he has not benefited from DuoNeb. He still has not started Spiriva on a daily basis and I would like to do this. He queries whether he may still have some residual pneumonia since he significantly worsened when he was hospitalized for this. I doubt that this is the case unless he has some scar that is concluding  to his interstitial lung disease. All the same treat him for one week with Levaquin to settle the question.

## 2014-10-12 NOTE — Patient Instructions (Signed)
Please start taking your Spiriva once a day every day You may use her albuterol-ipratropium nebulizers as needed up to 3-4 times a day We will perform a walking oximetry today on 2 L/m to ensure that this is adequate Take Levaquin 500 mg once a day for 7 days We will arrange for you to have an alternative oxygen company Follow with Dr Lamonte Sakai in 1 month

## 2014-10-14 ENCOUNTER — Encounter: Payer: Self-pay | Admitting: Cardiology

## 2014-10-20 ENCOUNTER — Ambulatory Visit (INDEPENDENT_AMBULATORY_CARE_PROVIDER_SITE_OTHER): Payer: Commercial Managed Care - HMO | Admitting: Cardiology

## 2014-10-20 DIAGNOSIS — I480 Paroxysmal atrial fibrillation: Secondary | ICD-10-CM

## 2014-10-20 DIAGNOSIS — Z5181 Encounter for therapeutic drug level monitoring: Secondary | ICD-10-CM

## 2014-10-20 LAB — POCT INR: INR: 2.4

## 2014-10-22 ENCOUNTER — Encounter: Payer: Self-pay | Admitting: Internal Medicine

## 2014-10-27 ENCOUNTER — Ambulatory Visit: Payer: Commercial Managed Care - HMO | Admitting: Cardiology

## 2014-11-16 ENCOUNTER — Encounter: Payer: Self-pay | Admitting: Emergency Medicine

## 2014-11-16 ENCOUNTER — Ambulatory Visit (INDEPENDENT_AMBULATORY_CARE_PROVIDER_SITE_OTHER): Payer: Commercial Managed Care - HMO | Admitting: Emergency Medicine

## 2014-11-16 VITALS — BP 120/70 | HR 80 | Ht 68.0 in | Wt 174.0 lb

## 2014-11-16 DIAGNOSIS — J849 Interstitial pulmonary disease, unspecified: Secondary | ICD-10-CM

## 2014-11-16 DIAGNOSIS — J9621 Acute and chronic respiratory failure with hypoxia: Secondary | ICD-10-CM

## 2014-11-16 MED ORDER — ALBUTEROL SULFATE HFA 108 (90 BASE) MCG/ACT IN AERS: 2.0000 | INHALATION_SPRAY | Freq: Four times a day (QID) | RESPIRATORY_TRACT | Status: DC | PRN

## 2014-11-16 NOTE — Progress Notes (Signed)
Subjective:    Patient ID: Peter Richards, male    DOB: 04-20-1929, 81 y.o.   MRN: [id number]  HPI 81 yo man, former smoker (150 pk-yrs), hx CAD/CABG ('02), A Fib on amiodarone for the last 4 years, was stopped 1 month ago, followed by Dr Mare Ferrari. He has been experiencing progressive exertional SOB for over 3 months such that he had to stop to rest with walking about 75 feet. He has also had some exertional CP that has responded to NTG, but the episodes of dyspnea haven't always occurred at same time as the CP. He has a cough, bothering him for about a year. He hears wheezing at times, seems to come at random, not necessarily with exertion.  Hurts sometimes to take a deep breath. Denies any sx or RA, SLE, etc.   ROV 06/10/12 -- follows up for dyspnea, presumed to be multifactorial. He has ILD on CT scan chest as well as obstruction on PFT. We started Spiriva last time to see if he would benefit >> he believes that he is less SOB. He hasn't exerted significantly, but his usual daily activities are easier. His wheeze is better. Last time he desaturated with ambulation, but wanted to defer. He has intermittent aspiration sx >> swallow precautions recommended 04/2011: 1. Consider esophageal assessment 2. Regular textures & Thin liquids 3. Compensations: Hard cough after swallow 4. Upright 30-60 min after meal;Seated upright 90 degrees  ROV 12/11/13 -- follow up visit for mixed disease > COPD + ILD on CT scan, suspected related to aspiration and silica exposure. He has been experiencing more dyspnea over the last 2-3 months. He has a new persistent cough associated with mid-chest discomfort. This is mid-sternal, intermittent. Has been associated with exertion, with his cough. No change with meals. He sees Dr Mare Ferrari for A fib / pacer, but has not talked to him about the CP. Pt has only taking Spiriva prn, about every week.  >stop Spiriva  Follow up 01/04/14 --  Pt was seen last month for follow up for  COPD and ILD(suspected aspiration and silica exposure)  Repeat CT chest done on 7/22 showed progressed ILD changes .  We reveiwed these results in detail and questions were answered .  Previous PFT showed mixed disease in 2013.  Says he gets winded with walking and has to stop and rest.  Today in office ambulatory walk showed drop in sats 85% on RA. At rest 92% on RA.  We discussed his option and he agrees to begin O2 with activity and. At bedtime .  He remains on Spiriva daily .  No hemoptysis ,orthopnea, exertional chest pain , edema or fever. No flare of cough.   ROV 02/03/14 -- follow up for ILD and COPD. PFT done 9/2 > mixed disease, FEV1 67% pred. We had attempted stopping Spiriva at one point. He hasn't seen Dr Mare Ferrari yet about his CP > happens with exertion, whenever he is dyspneic. He started O2 last month > believes it has helped his dyspnea and CP.  He stopped taking the spiriva last month when he started the oxygen.  He uses prn and rarely.  He was on nexium for a week for cough.> stopped it, wonders about restarting.   ROV 07/13/14 -- follows for his hx ILD and COPD. Last time we added back Spiriva once a day, but he is only using occasionally prn.  Was concerned that he was having exertional chest pain. He has followed with [name] and a  stress test showed no reversible ischemia in 03/03/14. He continues to have CP with a deep breath, not necessarily exertion. He is using O2 at night, but using during the day prn.   ROV 10/12/14 -- follow-up for interstitial lung disease and COPD. Her last visit we discussed taking his Spiriva every day reliably. He He has stable CP with a deep breathing, his exertional SOB is worse than last visit.  He is on 2L/min at all times (? Whether this is enough). He takes albuterol (? Duonebs) about once a day, doesn't notice much benefit.   ROV 11/16/14 -- follow-up visit for COPD and interstitial lung disease with associated hypoxemia. At our last visit I  treated him with another week of levofloxacin to ensure that he did not have any residual pneumonia superimposed on his interstitial scarring. We also discussed taking Spiriva reliably every day. He has not been using his O2 reliably with exertion - finds it more of a burden than a help. He restarted spiriva, he believes it has been helpful. He uses SABA every other day. Needs refill on the SABA.      Objective:   Physical Exam Filed Vitals:   11/16/14 1506 11/16/14 1508  BP:  120/70  Pulse:  80  Height: 5\' 8"  (1.727 m)   Weight: 174 lb (78.926 kg)   SpO2:  93%    Gen: Pleasant, well-nourished, in no distress,  normal affect  ENT: No lesions,  mouth clear,  oropharynx clear, no postnasal drip  Neck: No JVD, no TMG, no carotid bruits  Lungs: No use of accessory muscles, distant, B insp crackles, L > R  Cardiovascular: RRR, heart sounds normal, no murmur or gallops, no peripheral edema  Musculoskeletal: No deformities, no cyanosis or clubbing,   Neuro: alert, non focal  Skin: Warm, no lesions or rashes   Data:  PFT were performed  04/09/12>> show mixed disease, no BD response, restricted volumes, decreased DLCO that corrects for Va Repeated 02/03/14 >> mixed disease, restricted volumes, decreased DLCO   CT chest 12/23/13 >Mediastinal lymph nodes measure up to 1 mm in AP window. There is a  larger lymph node in the lower right paratracheal station which is  predominantly made up of a fatty hilum. Hilar regions are difficult  to definitively evaluate without IV contrast. No axillary  adenopathy. Atherosclerotic calcification of the arterial  vasculature. Heart is at the upper limits of normal in size. No  pericardial effusion.  There is a peripheral and basilar predominant pattern of subpleural  reticulation, traction bronchiectasis/bronchiolectasis and mild  associated architectural distortion. There may be honeycombing in  the lower lobes. Findings have progressed in the lung  bases when  compared with 04/11/2012. No air trapping. No pleural fluid. Debris  is seen dependently in the airway.  Incidental imaging of the upper abdomen shows the visualized  portions of the liver, adrenal glands, kidneys, spleen, pancreas and  stomach to be grossly unremarkable. No upper abdominal adenopathy.  No worrisome lytic or sclerotic lesions. Degenerative changes are  seen in the spine.  IMPRESSION:  Peripheral and basilar predominant pattern of subpleural  reticulation, traction bronchiectasis/ bronchiolectasis and  associated architectural distortion. Findings have progressed from  04/11/2012 and indicative of usual interstitial pneumonitis (UIP).      Assessment & Plan:  COPD (chronic obstructive pulmonary disease) He does seem to have been admitted to some degree from Spiriva. He is using albuterol with good results. I don't believe he has maximized his improvement  because he is unreliable with oxygen with exertion. I discussed the importance of this in detail with him today. I have encouraged him to use the oxygen at 2 L/m whenever he is up and exerting.   Interstitial lung disease Underscored with him today that the best treatment I have available is supplemental oxygen. Stressed that he needs to use this reliably  Acute and chronic respiratory failure with hypoxia Due to both interstitial disease and COPD

## 2014-11-16 NOTE — Assessment & Plan Note (Signed)
He does seem to have been admitted to some degree from Spiriva. He is using albuterol with good results. I don't believe he has maximized his improvement because he is unreliable with oxygen with exertion. I discussed the importance of this in detail with him today. I have encouraged him to use the oxygen at 2 L/m whenever he is up and exerting.

## 2014-11-16 NOTE — Assessment & Plan Note (Signed)
Underscored with him today that the best treatment I have available is supplemental oxygen. Stressed that he needs to use this reliably

## 2014-11-16 NOTE — Assessment & Plan Note (Signed)
Due to both interstitial disease and COPD

## 2014-11-16 NOTE — Patient Instructions (Signed)
Please continue Spiriva once a day every day Try to work hard on using your oxygen at 2 L/m with all exertion and while sleeping. You do not need to use it when you are at rest We will refill your albuterol inhaler today. Use this 2 puffs if needed for shortness of breath Remember to get the flu shot this fall Follow with Dr Lamonte Sakai in 6 months or sooner if you have any problems

## 2014-11-17 ENCOUNTER — Ambulatory Visit (INDEPENDENT_AMBULATORY_CARE_PROVIDER_SITE_OTHER): Payer: Commercial Managed Care - HMO | Admitting: Internal Medicine

## 2014-11-17 DIAGNOSIS — Z5181 Encounter for therapeutic drug level monitoring: Secondary | ICD-10-CM

## 2014-11-17 DIAGNOSIS — I480 Paroxysmal atrial fibrillation: Secondary | ICD-10-CM

## 2014-11-17 LAB — POCT INR: INR: 2.1

## 2014-12-13 ENCOUNTER — Ambulatory Visit (INDEPENDENT_AMBULATORY_CARE_PROVIDER_SITE_OTHER): Payer: Commercial Managed Care - HMO | Admitting: Cardiology

## 2014-12-13 ENCOUNTER — Encounter: Payer: Self-pay | Admitting: Cardiology

## 2014-12-13 VITALS — BP 110/60 | HR 68 | Ht 68.0 in | Wt 170.0 lb

## 2014-12-13 DIAGNOSIS — R0789 Other chest pain: Secondary | ICD-10-CM | POA: Diagnosis not present

## 2014-12-13 DIAGNOSIS — I480 Paroxysmal atrial fibrillation: Secondary | ICD-10-CM | POA: Diagnosis not present

## 2014-12-13 DIAGNOSIS — R0609 Other forms of dyspnea: Secondary | ICD-10-CM | POA: Diagnosis not present

## 2014-12-13 NOTE — Progress Notes (Signed)
Cardiology Office Note   Date:  12/13/2014   ID:  Peter Richards, DOB Jun 24, 1928, MRN [id number]  PCP:  Mathews Argyle, MD  Cardiologist: Darlin Coco MD  Chief Complaint  Patient presents with  . Chest Pain      History of Present Illness: Peter Richards is a 81 y.o. male who presents for scheduled follow-up office visit.  This pleasant 81 year old gentleman is seen for a scheduled four-month follow-up office visit. He has a history of CABG in 2002. He has a history of tachybradycardia syndrome and paroxysmal atrial fibrillation. He has a dual-chamber pacemaker. His pacemaker is followed by Dr. Lovena Le. . He has a history of hypercholesterolemia. His lipids are followed by Dr. Felipa Eth. The patient is on long-term Coumadin anticoagulation because of his paroxysmal A. fib. He is maintaining normal sinus rhythm. The patient has been off amiodarone since last year because of question of pulmonary fibrosis. A recent chest x-ray in September 2015 showed no change in the amount of fibrosis from last year.  The patient had an echocardiogram 04/11/12 showed an ejection fraction of 55-65% with grade 1 diastolic dysfunction. His last nuclear stress test was an adenosine study on 04/21/08 which showed an ejection fraction of 61% and no ischemia.  He has a history of back pain and is followed by Dr. Lynann Bologna and underwent successful back surgery with insertion of rods. It has relieved his low back pain. The patient has been having some pain in his legs. He saw Dr. Trula Slade for a check of his circulation. Dr. Trula Slade found that his leg circulation was fine.  The patient has known carotid bruits.  His last carotid Doppler ultrasound was 05/19/15 which showed less than 50% stenosis bilaterally.  He has had a previous left carotid endarterectomy. The patient has not been having any recurrent chest discomfort.  Today he is concerned about more shortness of breath.  Also for the past  2-3 months he has had some substernal chest discomfort which occurs when he takes a deep breath.  The patient is not bringing up any sputum.  He has a nonproductive cough.  He continues to chew tobacco against advice.  This habit bothers his wife quite a bit.  The patient has a history of hypoxemia and has been using home oxygen every night at 2 L a minute and he also uses it on a when necessary basis during the day. He has been taking hydrocodone's about twice a day to help with pain  Past Medical History  Diagnosis Date  . Hyperlipidemia   . Orthostatic hypotension   . Bradycardia   . Syncope     s/p PTVP  . Myocardial infarction     1981  . CAD (coronary artery disease)     prior CABG in 2002 negative Myoview in 2009  . Hypertension   . Stroke     TIA  2007    . GERD (gastroesophageal reflux disease)   . Headache(784.0)   . Arthritis     OSTEOPOROSIS  . Aortic stenosis, mild     by 01/08/08 echo  . Chronic anticoagulation   . Depression   . AAA (abdominal aortic aneurysm)   . Heart attack   . Pacemaker     medtronic pacer  . History of cervical spinal surgery     [place]  [name] HOH (hard of hearing)     BILATERAL HEARING AIDS  . Prostate cancer   . Basal cell adenocarcinoma     '  bottom lip" (11/06/2012)  . Bladder cancer   . Attention to urostomy     "since 2006" (11/06/2012)  . Atrial fibrillation     on amiodarone and has PTVP in place  . Exertional shortness of breath   . Chronic radicular pain of lower back   . Anxiety   . COPD (chronic obstructive pulmonary disease)     Past Surgical History  Procedure Laterality Date  . Insert / replace / remove pacemaker      11/2009  DR BRODIE    . Prostatectomy      BLADDER ALSO REMOVED (OSTOMY BAG)  2006  FOR CANCER     . Abdominal aortic aneurysm repair  1999    STENT PLACED    . Cataract extraction w/ intraocular lens  implant, bilateral    . Appendectomy      1959  . Revision urostomy cutaneous    . Revision  urostomy cutaneous  2006    Urostomy bag placed on right lower abdomen  . Vertebroplasty  08/22/2011    Procedure: VERTEBROPLASTY;  Surgeon: Winfield Cunas, MD;  Location: [facility];  Service: Neurosurgery;  Laterality: N/A;  Lumbar Two vertebroplasty  . Cholecystectomy      4 YRS AGO  . Posterior lumbar fusion  11/06/2012  . Coronary artery bypass graft  1999    CABG X2  . Skin cancer excision      "bottom lip" (11/06/2012)  . Bladder surgery      "for cancer" (11/06/2012)  . Lumbar laminectomy/decompression microdiscectomy Bilateral 11/06/2012    Procedure: LUMBAR LAMINECTOMY/DECOMPRESSION MICRODISCECTOMY;  Surgeon: Sinclair Ship, MD;  Location: [facility];  Service: Orthopedics;  Laterality: Bilateral;  Lumbar 2-5 decompression     Current Outpatient Prescriptions  Medication Sig Dispense Refill  . albuterol (PROVENTIL HFA;VENTOLIN HFA) 108 (90 BASE) MCG/ACT inhaler Inhale 2 puffs into the lungs every 6 (six) hours as needed for wheezing or shortness of breath. 1 Inhaler 5  . escitalopram (LEXAPRO) 20 MG tablet Take 20 mg by mouth daily.    . fentaNYL (DURAGESIC - DOSED MCG/HR) 25 MCG/HR patch Place 1 patch (25 mcg total) onto the skin every 3 (three) days. 5 patch 0  . HYDROcodone-acetaminophen (NORCO/VICODIN) 5-325 MG per tablet Take 1 tablet by mouth every 6 (six) hours as needed for moderate pain or severe pain.    [name] ipratropium-albuterol (DUONEB) 0.5-2.5 (3) MG/3ML SOLN Take 3 mLs by nebulization every 4 (four) hours as needed. 360 mL 5  . levofloxacin (LEVAQUIN) 500 MG tablet Take 1 tablet (500 mg total) by mouth daily. 7 tablet 0  . metoprolol succinate (TOPROL-XL) 25 MG 24 hr tablet Take 0.5 tablets (12.5 mg total) by mouth daily. 45 tablet 3  . nitroGLYCERIN (NITROSTAT) 0.4 MG SL tablet Place 0.4 mg under the tongue every 5 (five) minutes as needed for chest pain (MAX 3 TABLETS).    [name] PRESCRIPTION MEDICATION Wears 2 L of O2 at night    . simvastatin (ZOCOR) 10 MG tablet Take 10  mg by mouth every evening.    . tiotropium (SPIRIVA) 18 MCG inhalation capsule Place 1 capsule (18 mcg total) into inhaler and inhale daily as needed (for shortness of breath). 90 capsule 3  . VOLTAREN 1 % GEL Apply 2 g topically daily as needed (pain).     [name] warfarin (COUMADIN) 5 MG tablet Take 5-7.5 mg by mouth every evening. Take 7.5mg  on Monday, Wednesday and Friday. Take 5mg  all other days of  the week.    . zolpidem (AMBIEN) 5 MG tablet Take 1 tablet (5 mg total) by mouth at bedtime as needed for sleep. 30 tablet 5   No current facility-administered medications for this visit.    Allergies:   Codeine    Social History:  The patient  reports that he quit smoking about 36 years ago. His smoking use included Cigarettes. He has a 150 pack-year smoking history. His smokeless tobacco use includes Snuff. He reports that he does not drink alcohol or use illicit drugs.   Family History:  The patient's family history includes Cancer in his father and sister; Heart disease in his mother and sister; Hypertension in his mother and sister. There is no history of Heart attack or Stroke.    ROS:  Please see the history of present illness.   Otherwise, review of systems are positive for none.   All other systems are reviewed and negative.    PHYSICAL EXAM: VS:  BP 110/60 mmHg  Pulse 68  Ht 5\' 8"  (1.727 m)  Wt 170 lb (77.111 kg)  BMI 25.85 kg/m2 , BMI Body mass index is 25.85 kg/(m^2). GEN: Well nourished, well developed, in no acute distress HEENT: normal Neck: no JVD, there are bilateral moderate carotid bruits Cardiac: RRR; no murmurs, rubs, or gallops,no edema .  No pleural or  pericardial rub is heard Respiratory:  Bilateral musical rhonchi and wheezing.  The patient is in no acute respiratory distress. GI: soft, nontender, nondistended, + BS MS: no deformity or atrophy Skin: warm and dry, no rash Neuro:  Strength and sensation are intact Psych: euthymic mood, full affect   EKG:  EKG is  ordered today. The ekg ordered today demonstrates normal sinus rhythm.  No ischemic changes.  Since previous tracing of 08/20/14, heart rate is slower   Recent Labs: 08/20/2014: B Natriuretic Peptide 49.4 08/21/2014: ALT 14 08/24/2014: BUN 24*; Creatinine, Ser 0.97; Hemoglobin 12.4*; Platelets 218; Potassium 4.1; Sodium 139    Lipid Panel    Component Value Date/Time   CHOL 128 05/06/2012 1505   TRIG 210.0* 05/06/2012 1505   HDL 37.30* 05/06/2012 1505   CHOLHDL 3 05/06/2012 1505   VLDL 42.0* 05/06/2012 1505   LDLCALC  11/12/2009 0100    26        Total Cholesterol/HDL:CHD Risk Coronary Heart Disease Risk Table                     Men   Women  1/2 Average Risk   3.4   3.3  Average Risk       5.0   4.4  2 X Average Risk   9.6   7.1  3 X Average Risk  23.4   11.0        Use the calculated Patient Ratio above and the CHD Risk Table to determine the patient's CHD Risk.        ATP III CLASSIFICATION (LDL):  <100     mg/dL   Optimal  100-129  mg/dL   Near or Above                    Optimal  130-159  mg/dL   Borderline  160-189  mg/dL   High  >190     mg/dL   Very High   LDLDIRECT 64.4 05/06/2012 1505      Wt Readings from Last 3 Encounters:  12/13/14 170 lb (77.111 kg)  11/16/14 174 lb (78.926 kg)  10/12/14 169 lb (76.658 kg)       ASSESSMENT AND PLAN:  1. Paroxysmal atrial fibrillation 2. ischemic heart disease status post remote CABG (old records suggest 2002 and also mention 1999). Lexiscan Myoview stress test on 03/03/14 was satisfactory with a fixed anterior defect and no reversible ischemia and ejection fraction 48%. 3. status post abdominal aortic aneurysm repair 1999 4. permanent pacemaker implantation in June 2011 for syncope by Dr. Olevia Perches. 5. status post cholecystectomy 6. Hypercholesterolemia followed by Dr. Felipa Eth. 7. Insomnia 8. status post left carotid endarterectomy 9. mild aortic stenosis 10. Status post bladder cancer with subsequent  urostomy 11. Recent hematuria, being evaluated by Dr. Risa Grill. 12. Low blood pressure 13.  History of interstitial lung disease with chronic dyspnea, recently worse over past several months   Current medicines are reviewed at length with the patient today.  The patient does not have concerns regarding medicines.  The following changes have been made:  no change  Labs/ tests ordered today include:   Orders Placed This Encounter  Procedures  . DG Chest 2 View  . EKG 12-Lead     Disposition: The patient is to continue current medication.  He was strongly urged to quit chewing tobacco which could be aggravating his pulmonary symptoms.  He is concerned that his respiratory status and cardiac status have worsened.  We will get a chest x-ray today.  Recheck in 4 months for follow-up office visit.  Berna Spare MD 12/13/2014 1:18 PM    [address], [place], [place]  [address] Phone: [phone]; Fax: [phone]

## 2014-12-13 NOTE — Patient Instructions (Signed)
Medication Instructions:  Your physician recommends that you continue on your current medications as directed. Please refer to the Current Medication list given to you today.  Labwork: NONE  Testing/Procedures: A chest x-ray takes a picture of the organs and structures inside the chest, including the heart, lungs, and blood vessels. This test can show several things, including, whether the heart is enlarges; whether fluid is building up in the lungs; and whether pacemaker / defibrillator leads are still in place. [facility]  Follow-Up: Your physician wants you to follow-up in: [place] will receive a reminder letter in the mail two months in advance. If you don't receive a letter, please call our office to schedule the follow-up appointment.

## 2014-12-17 ENCOUNTER — Telehealth: Payer: Self-pay | Admitting: *Deleted

## 2014-12-17 ENCOUNTER — Ambulatory Visit
Admission: RE | Admit: 2014-12-17 | Discharge: 2014-12-17 | Disposition: A | Discharge status: Home / Self Care | Payer: Commercial Managed Care - HMO | Source: Ambulatory Visit | Attending: Cardiology | Admitting: Cardiology

## 2014-12-17 DIAGNOSIS — R0789 Other chest pain: Secondary | ICD-10-CM

## 2014-12-17 DIAGNOSIS — I480 Paroxysmal atrial fibrillation: Secondary | ICD-10-CM

## 2014-12-17 DIAGNOSIS — R0609 Other forms of dyspnea: Secondary | ICD-10-CM

## 2014-12-17 MED ORDER — FUROSEMIDE 20 MG PO TABS: 20.0000 mg | ORAL_TABLET | Freq: Every day | ORAL | Status: AC | PRN

## 2014-12-17 NOTE — Telephone Encounter (Signed)
-----   Message from Darlin Coco, MD sent at 12/17/2014  2:18 PM EDT ----- Chest xray shows chronic fibrosis which may be slightly worse since March. There may be some superimposed increased interstitial fluid. We can add lasix 20 mg daily PRN for increased dyspnea and observe response. His BNP in March was normal.

## 2014-12-17 NOTE — Telephone Encounter (Signed)
Advised patient

## 2014-12-27 ENCOUNTER — Ambulatory Visit (INDEPENDENT_AMBULATORY_CARE_PROVIDER_SITE_OTHER): Payer: Commercial Managed Care - HMO | Admitting: Adult Health

## 2014-12-27 ENCOUNTER — Telehealth: Payer: Self-pay | Admitting: Emergency Medicine

## 2014-12-27 ENCOUNTER — Encounter: Payer: Self-pay | Admitting: Adult Health

## 2014-12-27 VITALS — BP 118/66 | HR 72 | Temp 97.6°F | Ht 68.0 in | Wt 167.0 lb

## 2014-12-27 DIAGNOSIS — J449 Chronic obstructive pulmonary disease, unspecified: Secondary | ICD-10-CM

## 2014-12-27 DIAGNOSIS — J849 Interstitial pulmonary disease, unspecified: Secondary | ICD-10-CM

## 2014-12-27 MED ORDER — BUDESONIDE-FORMOTEROL FUMARATE 160-4.5 MCG/ACT IN AERO: 2.0000 | INHALATION_SPRAY | Freq: Two times a day (BID) | RESPIRATORY_TRACT | Status: AC

## 2014-12-27 NOTE — Progress Notes (Signed)
Subjective:    Patient ID: Peter Richards, male    DOB: 03-21-1929, 81 y.o.   MRN: [id number]  HPI 81 yo man, former smoker (150 pk-yrs), hx CAD/CABG ('02), A Fib on amiodarone for the last 4 years, was stopped 1 month ago, followed by Dr Peter Richards. He has been experiencing progressive exertional SOB for over 3 months such that he had to stop to rest with walking about 75 feet. He has also had some exertional CP that has responded to NTG, but the episodes of dyspnea haven't always occurred at same time as the CP. He has a cough, bothering him for about a year. He hears wheezing at times, seems to come at random, not necessarily with exertion.  Hurts sometimes to take a deep breath. Denies any sx or RA, SLE, etc.   ROV 06/10/12 -- follows up for dyspnea, presumed to be multifactorial. He has ILD on CT scan chest as well as obstruction on PFT. We started Spiriva last time to see if he would benefit >> he believes that he is less SOB. He hasn't exerted significantly, but his usual daily activities are easier. His wheeze is better. Last time he desaturated with ambulation, but wanted to defer. He has intermittent aspiration sx >> swallow precautions recommended 04/2011: 1. Consider esophageal assessment 2. Regular textures & Thin liquids 3. Compensations: Hard cough after swallow 4. Upright 30-60 min after meal;Seated upright 90 degrees  ROV 12/11/13 -- follow up visit for mixed disease > COPD + ILD on CT scan, suspected related to aspiration and silica exposure. He has been experiencing more dyspnea over the last 2-3 months. He has a new persistent cough associated with mid-chest discomfort. This is mid-sternal, intermittent. Has been associated with exertion, with his cough. No change with meals. He sees Dr Peter Richards for A fib / pacer, but has not talked to him about the CP. Pt has only taking Spiriva prn, about every week.  >stop Spiriva  Follow up 01/04/14 --  Pt was seen last month for follow up for  COPD and ILD(suspected aspiration and silica exposure)  Repeat CT chest done on 7/22 showed progressed ILD changes .  We reveiwed these results in detail and questions were answered .  Previous PFT showed mixed disease in 2013.  Says he gets winded with walking and has to stop and rest.  Today in office ambulatory walk showed drop in sats 85% on RA. At rest 92% on RA.  We discussed his option and he agrees to begin O2 with activity and. At bedtime .  He remains on Spiriva daily .  No hemoptysis ,orthopnea, exertional chest pain , edema or fever. No flare of cough.   ROV 02/03/14 -- follow up for ILD and COPD. PFT done 9/2 > mixed disease, FEV1 67% pred. We had attempted stopping Spiriva at one point. He hasn't seen Dr Peter Richards yet about his CP > happens with exertion, whenever he is dyspneic. He started O2 last month > believes it has helped his dyspnea and CP.  He stopped taking the spiriva last month when he started the oxygen.  He uses prn and rarely.  He was on nexium for a week for cough.> stopped it, wonders about restarting.   ROV 07/13/14 -- follows for his hx ILD and COPD. Last time we added back Spiriva once a day, but he is only using occasionally prn.  Was concerned that he was having exertional chest pain. He has followed with Dr. Mare Richards and a  stress test showed no reversible ischemia in 03/03/14. He continues to have CP with a deep breath, not necessarily exertion. He is using O2 at night, but using during the day prn.   ROV 10/12/14 -- follow-up for interstitial lung disease and COPD. Her last visit we discussed taking his Spiriva every day reliably. He He has stable CP with a deep breathing, his exertional SOB is worse than last visit.  He is on 2L/min at all times (? Whether this is enough). He takes albuterol (? Duonebs) about once a day, doesn't notice much benefit.   ROV 11/16/14 -- follow-up visit for COPD and interstitial lung disease with associated hypoxemia. At our last visit I  treated him with another week of levofloxacin to ensure that he did not have any residual pneumonia superimposed on his interstitial scarring. We also discussed taking Spiriva reliably every day. He has not been using his O2 reliably with exertion - finds it more of a burden than a help. He restarted spiriva, he believes it has been helpful. He uses SABA every other day. Needs refill on the SABA.   12/27/2014 Acute OV Peter Richards OV -COPD /ILD with chronic resp failure on O2 Pt presents for acute office visit.  Says he feels his breathing is not getting any better, DOE is getting worse over last year.  Says he is using his spiriva daily and wearing oxygen 2l/m but does feel it helps.  We looked at his xray with diffuse ILD changes.  He was seen by cards 2 weeks ago , ?edema on cxr , started on lasix but did not seem to help.  Denies increased cough or congestion  Says he has  A dry cough mostly couple of times a day.  No chest pain, orthopnea, edema or fever or hemoptysis .  Walking in office today on 2l/m of oxygen with no desats.  Uses his albuterol several times a day , says he tries to see if he can do more  We discussed correct use of rescue inhaler.      ROS Constitutional:   No  weight loss, night sweats,  Fevers, chills,  +fatigue, or  lassitude.  HEENT:   No headaches,  Difficulty swallowing,  Tooth/dental problems, or  Sore throat,                No sneezing, itching, ear ache, nasal congestion, post nasal drip,   CV:  No chest pain,  Orthopnea, PND, swelling in lower extremities, anasarca, dizziness, palpitations, syncope.   GI  No heartburn, indigestion, abdominal pain, nausea, vomiting, diarrhea, change in bowel habits, loss of appetite, bloody stools.   Resp:    No chest wall deformity  Skin: no rash or lesions.  GU: no dysuria, change in color of urine, no urgency or frequency.  No flank pain, no hematuria   MS:  No joint pain or swelling.  No decreased range of motion.   No back pain.  Psych:  No change in mood or affect. No depression or anxiety.  No memory loss.          Objective:   Physical Exam    Gen: Pleasant, well-nourished, in no distress,  normal affect  ENT: No lesions,  mouth clear,  oropharynx clear, no postnasal drip  Neck: No JVD, no TMG, no carotid bruits  Lungs: No use of accessory muscles, distant, B insp crackles, L > R  Cardiovascular: RRR, heart sounds normal, no murmur or gallops, no peripheral edema  Musculoskeletal: No deformities, no cyanosis or clubbing,   Neuro: alert, non focal  Skin: Warm, no lesions or rashes   Data:  PFT were performed  04/09/12>> show mixed disease, no BD response, restricted volumes, decreased DLCO that corrects for Va Repeated 02/03/14 >> mixed disease, restricted volumes, decreased DLCO   CT chest 12/23/13 >Mediastinal lymph nodes measure up to 1 mm in AP window. There is a  larger lymph node in the lower right paratracheal station which is  predominantly made up of a fatty hilum. Hilar regions are difficult  to definitively evaluate without IV contrast. No axillary  adenopathy. Atherosclerotic calcification of the arterial  vasculature. Heart is at the upper limits of normal in size. No  pericardial effusion.  There is a peripheral and basilar predominant pattern of subpleural  reticulation, traction bronchiectasis/bronchiolectasis and mild  associated architectural distortion. There may be honeycombing in  the lower lobes. Findings have progressed in the lung bases when  compared with 04/11/2012. No air trapping. No pleural fluid. Debris  is seen dependently in the airway.  Incidental imaging of the upper abdomen shows the visualized  portions of the liver, adrenal glands, kidneys, spleen, pancreas and  stomach to be grossly unremarkable. No upper abdominal adenopathy.  No worrisome lytic or sclerotic lesions. Degenerative changes are  seen in the spine.  IMPRESSION:  Peripheral  and basilar predominant pattern of subpleural  reticulation, traction bronchiectasis/ bronchiolectasis and  associated architectural distortion. Findings have progressed from  04/11/2012 and indicative of usual interstitial pneumonitis (UIP).      Assessment & Plan:

## 2014-12-27 NOTE — Telephone Encounter (Signed)
Called pt and appt scheduled to see TP this afternoon at 4:15. Nothing further needed

## 2014-12-27 NOTE — Assessment & Plan Note (Signed)
Wear Oxygen 2l/m all the time  follow up Dr. Lamonte Sakai  In 6-8 weeks and As needed   Please contact office for sooner follow up if symptoms do not improve or worsen or seek emergency care

## 2014-12-27 NOTE — Assessment & Plan Note (Signed)
Will try to add ICS/LABA to see if help symptoms suspect most of sx are from ILD   Plan  Begin Symbicort 160 2 puffs Twice daily  , rinse after use  Continue on Spiriva daily  Use Ventolin Inhaler As needed  Only -rescue inhaler only .  Wear Oxygen 2l/m all the time  follow up Dr. Lamonte Sakai  In 6-8 weeks and As needed   Please contact office for sooner follow up if symptoms do not improve or worsen or seek emergency care

## 2014-12-27 NOTE — Patient Instructions (Signed)
Begin Symbicort 160 2 puffs Twice daily  , rinse after use  Continue on Spiriva daily  Use Ventolin Inhaler As needed  Only -rescue inhaler only .  Wear Oxygen 2l/m all the time  follow up Dr. Lamonte Sakai  In 6-8 weeks and As needed   Please contact office for sooner follow up if symptoms do not improve or worsen or seek emergency care

## 2014-12-29 ENCOUNTER — Telehealth: Payer: Self-pay | Admitting: *Deleted

## 2014-12-29 ENCOUNTER — Ambulatory Visit (INDEPENDENT_AMBULATORY_CARE_PROVIDER_SITE_OTHER): Payer: Commercial Managed Care - HMO | Admitting: Cardiology

## 2014-12-29 DIAGNOSIS — I480 Paroxysmal atrial fibrillation: Secondary | ICD-10-CM

## 2014-12-29 DIAGNOSIS — Z5181 Encounter for therapeutic drug level monitoring: Secondary | ICD-10-CM

## 2014-12-29 LAB — POCT INR: INR: 2.2

## 2014-12-29 NOTE — Telephone Encounter (Signed)
Pt received [employer]. I gave him instructions how to disconnect his analog line and how to connect his [place]. Pt voiced understanding. Pt aware to transmit near a window. Pt will send sometime in morning. Pt aware how to send a manual remote.

## 2014-12-30 ENCOUNTER — Ambulatory Visit (INDEPENDENT_AMBULATORY_CARE_PROVIDER_SITE_OTHER): Payer: Commercial Managed Care - HMO | Admitting: Internal Medicine

## 2014-12-30 ENCOUNTER — Encounter: Payer: Self-pay | Admitting: Internal Medicine

## 2014-12-30 VITALS — BP 108/64 | HR 67 | Ht 68.0 in | Wt 168.0 lb

## 2014-12-30 DIAGNOSIS — J849 Interstitial pulmonary disease, unspecified: Secondary | ICD-10-CM | POA: Diagnosis not present

## 2014-12-30 DIAGNOSIS — R0789 Other chest pain: Secondary | ICD-10-CM

## 2014-12-30 DIAGNOSIS — J9611 Chronic respiratory failure with hypoxia: Secondary | ICD-10-CM | POA: Diagnosis not present

## 2014-12-30 NOTE — Progress Notes (Signed)
Subjective:    Patient ID: Peter Richards, male    DOB: 1929-02-12, 81 y.o.   MRN: [id number]  HPI    80 yo man, former smoker (150 pk-yrs), hx CAD/CABG ('02), A Fib on amiodarone for the last 4 years, was stopped 1 month ago, followed by Dr Peter Richards. He has been experiencing progressive exertional SOB for over 3 months such that he had to stop to rest with walking about 75 feet. He has also had some exertional CP that has responded to NTG, but the episodes of dyspnea haven't always occurred at same time as the CP. He has a cough, bothering him for about a year. He hears wheezing at times, seems to come at random, not necessarily with exertion.  Hurts sometimes to take a deep breath. Denies any sx or RA, SLE, etc.   ROV 06/10/12 -- follows up for dyspnea, presumed to be multifactorial. He has ILD on CT scan chest as well as obstruction on PFT. We started Spiriva last time to see if he would benefit >> he believes that he is less SOB. He hasn't exerted significantly, but his usual daily activities are easier. His wheeze is better. Last time he desaturated with ambulation, but wanted to defer. He has intermittent aspiration sx >> swallow precautions recommended 04/2011: 1. Consider esophageal assessment 2. Regular textures & Thin liquids 3. Compensations: Hard cough after swallow 4. Upright 30-60 min after meal;Seated upright 90 degrees  ROV 12/11/13 -- follow up visit for mixed disease > COPD + ILD on CT scan, suspected related to aspiration and silica exposure. He has been experiencing more dyspnea over the last 2-3 months. He has a new persistent cough associated with mid-chest discomfort. This is mid-sternal, intermittent. Has been associated with exertion, with his cough. No change with meals. He sees Dr Peter Richards for A fib / pacer, but has not talked to him about the CP. Pt has only taking Spiriva prn, about every week.  >stop Spiriva  Follow up 01/04/14 --  Pt was seen last month for follow  up for COPD and ILD(suspected aspiration and silica exposure)  Repeat CT chest done on 7/22 showed progressed ILD changes .  We reveiwed these results in detail and questions were answered .  Previous PFT showed mixed disease in 2013.  Says he gets winded with walking and has to stop and rest.  Today in office ambulatory walk showed drop in sats 85% on RA. At rest 92% on RA.  We discussed his option and he agrees to begin O2 with activity and. At bedtime .  He remains on Spiriva daily .  No hemoptysis ,orthopnea, exertional chest pain , edema or fever. No flare of cough.   ROV 02/03/14 -- follow up for ILD and COPD. PFT done 9/2 > mixed disease, FEV1 67% pred. We had attempted stopping Spiriva at one point. He hasn't seen Dr Peter Richards yet about his CP > happens with exertion, whenever he is dyspneic. He started O2 last month > believes it has helped his dyspnea and CP.  He stopped taking the spiriva last month when he started the oxygen.  He uses prn and rarely.  He was on nexium for a week for cough.> stopped it, wonders about restarting.   ROV 07/13/14 -- follows for his hx ILD and COPD. Last time we added back Spiriva once a day, but he is only using occasionally prn.  Was concerned that he was having exertional chest pain. He has followed with  Dr. Mare Richards and a stress test showed no reversible ischemia in 03/03/14. He continues to have CP with a deep breath, not necessarily exertion. He is using O2 at night, but using during the day prn.   ROV 10/12/14 -- follow-up for interstitial lung disease and COPD. Her last visit we discussed taking his Spiriva every day reliably. He He has stable CP with a deep breathing, his exertional SOB is worse than last visit.  He is on 2L/min at all times (? Whether this is enough). He takes albuterol (? Duonebs) about once a day, doesn't notice much benefit.   ROV 11/16/14 -- follow-up visit for COPD and interstitial lung disease with associated hypoxemia. At our last  visit I treated him with another week of levofloxacin to ensure that he did not have any residual pneumonia superimposed on his interstitial scarring. We also discussed taking Spiriva reliably every day. He has not been using his O2 reliably with exertion - finds it more of a burden than a help. He restarted spiriva, he believes it has been helpful. He uses SABA every other day. Needs refill on the SABA.   12/27/2014 Acute OV Peter Richards OV -COPD /ILD with chronic resp failure on O2 Pt presents for acute office visit.  Says he feels his breathing is not getting any better, DOE is getting worse over last year.  Says he is using his spiriva daily and wearing oxygen 2l/m but does feel it helps.  We looked at his xray with diffuse ILD changes.  He was seen by cards 2 weeks ago , ?edema on cxr , started on lasix but did not seem to help.  Denies increased cough or congestion  Says he has  A dry cough mostly couple of times a day.  No chest pain, orthopnea, edema or fever or hemoptysis .  Walking in office today on 2l/m of oxygen with no desats.  Uses his albuterol several times a day , says he tries to see if he can do more  We discussed correct use of rescue inhaler.    Rx Begin symbicort Continue spiriva  OV 12/30/2014  Chief Complaint  Patient presents with  . Acute Visit    Pt seen on 7.25.16 by TP for an acute visit and has not improved since OV. Pt c/o increase in SOB, dry cough, midsternal CP when pt coughs. Pt denies f/c/s and chest congestion.    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 Presents with his daughter acutely. Dtr DPOA and very worried about his progerssion   Reports for the last 3-4 months he's having progressive dyspnea and cough associated with pleuritic frontal chest pain. Symptoms are rated as moderate-severe. The course is progressive. For redness  of breath Worsened with exertion. Shortness of breath Relieved by rest but o pain is worsened with deep inspiration and appears unrelated to exertion. This no fever or chills or edema or paroxysmal nocturnal dyspnea. He was only on nocturnal oxygen for the past one year but for the last 2 months he is on daily continuous oxygen. He is frustrated by his decline.  Several days ago he saw nurse practitioner. Symbicort was added to his Spiriva but this has not helped a medicine acute visit today  Review of his chart  - Pulmonary function test 02/03/2014 personally reviewed consistent with restriction FVC is 1.9 L/56%, total lung capacity 55% and  DLCO 7.3/39%. At that time this was moderate-severe restriction  - High resolution CT scan of the chest 12/22/2013: CTs to m personally visualized CT pattern is consistent with UIP pattern. 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 His smoking use included Cigarettes. He has a 150 pack-year smoking history. His smokeless tobacco use includes Snuff.  - Unclear amiodarone history though does have arrhythmia  problems  Lab work - INR 2.2 12/29/2014 - 08/24/2014: Creatinine 0.97 mg percent - 08/24/2014: Hemoglobin 12.4 g percent  Echocardiogram 10/06/2013: Left ventricular ejection fraction 55%  Review of Systems  Constitutional: Negative for fever and unexpected weight change.  HENT: Negative for congestion, dental problem, ear pain, nosebleeds, postnasal drip, rhinorrhea, sinus pressure, sneezing, sore throat and trouble swallowing.   Eyes: Negative for redness and itching.  Respiratory: Positive for cough and shortness of breath. Negative for chest tightness and wheezing.   Cardiovascular: Positive for chest pain. Negative for palpitations and leg swelling.    Gastrointestinal: Negative for nausea and vomiting.  Genitourinary: Negative for dysuria.  Musculoskeletal: Negative for joint swelling.  Skin: Negative for rash.  Neurological: Negative for headaches.  Hematological: Does not bruise/bleed easily.  Psychiatric/Behavioral: Negative for dysphoric mood. The patient is not nervous/anxious.     Current outpatient prescriptions:  .  albuterol (PROVENTIL HFA;VENTOLIN HFA) 108 (90 BASE) MCG/ACT inhaler, Inhale 2 puffs into the lungs every 6 (six) hours as needed for wheezing or shortness of breath., Disp: 1 Inhaler, Rfl: 5 .  budesonide-formoterol (SYMBICORT) 160-4.5 MCG/ACT inhaler, Inhale 2 puffs into the lungs 2 (two) times daily., Disp: 1 Inhaler, Rfl: 5 .  escitalopram (LEXAPRO) 20 MG tablet, Take 20 mg by mouth daily., Disp: , Rfl:  .  fentaNYL (DURAGESIC - DOSED MCG/HR) 25 MCG/HR patch, Place 1 patch (25 mcg total) onto the skin every 3 (three) days., Disp: 5 patch, Rfl: 0 .  furosemide (LASIX) 20 MG tablet, Take 1 tablet (20 mg total) by mouth daily as needed (increased shortness of breath)., Disp: 30 tablet, Rfl: 3 .  HYDROcodone-acetaminophen (NORCO/VICODIN) 5-325 MG per tablet, Take 1 tablet by mouth every 6 (six) hours as needed for moderate pain or severe pain., Disp: , Rfl:  .  ipratropium-albuterol (DUONEB) 0.5-2.5 (3) MG/3ML SOLN, Take 3 mLs by nebulization every 4 (four) hours as needed., Disp: 360 mL, Rfl: 5 .  metoprolol succinate (TOPROL-XL) 25 MG 24 hr tablet, Take 0.5 tablets (12.5 mg total) by mouth daily., Disp: 45 tablet, Rfl: 3 .  nitroGLYCERIN (NITROSTAT) 0.4 MG SL tablet, Place 0.4 mg under the tongue every 5 (five) minutes as needed for chest pain (MAX 3 TABLETS)., Disp: , Rfl:  .  NON FORMULARY, Alendronate sodium 70 mg 1 per week, Disp: , Rfl:  .  PRESCRIPTION MEDICATION, Wears 2 L of O2 at night, Disp: , Rfl:  .  simvastatin (ZOCOR) 10 MG tablet, Take 10 mg by mouth every evening., Disp: , Rfl:  .  tiotropium (SPIRIVA)  18 MCG inhalation capsule, Place 1 capsule (18 mcg total) into inhaler and inhale daily as needed (for shortness of breath)., Disp: 90 capsule, Rfl: 3 .  VOLTAREN 1 % GEL, Apply 2 g topically daily as needed (pain). , Disp: , Rfl:  .  warfarin (COUMADIN) 5 MG tablet, Take 5-7.5 mg by mouth every evening.  Take 7.5mg  on Monday, Wednesday and Friday. Take 5mg  all other days of the week., Disp: , Rfl:  .  zolpidem (AMBIEN) 5 MG tablet, Take 1 tablet (5 mg total) by mouth at bedtime as needed for sleep., Disp: 30 tablet, Rfl: 5     Objective:   Physical Exam  Constitutional: He is oriented to person, place, and time. He appears well-developed and well-nourished. No distress.  Frail deconditioned looking male  HENT:  Head: Normocephalic and atraumatic.  Right Ear: External ear normal.  Left Ear: External ear normal.  Mouth/Throat: Oropharynx is clear and moist. No oropharyngeal exudate.  Eyes: Conjunctivae and EOM are normal. Pupils are equal, round, and reactive to light. Right eye exhibits no discharge. Left eye exhibits no discharge. No scleral icterus.  Neck: Normal range of motion. Neck supple. No JVD present. No tracheal deviation present. No thyromegaly present.  Cardiovascular: Normal rate, regular rhythm and intact distal pulses.  Exam reveals no gallop and no friction rub.   No murmur heard. Pulmonary/Chest: Effort normal. No respiratory distress. He has no wheezes. He has rales. He exhibits no tenderness.  No respiratory distress but bilateral crackles in the base one fourth of the way up   Abdominal: Soft. Bowel sounds are normal. He exhibits no distension and no mass. There is no tenderness. There is no rebound and no guarding.  Musculoskeletal: Normal range of motion. He exhibits no edema or tenderness.  No evidence of rheumatoid arthritis He has a cane  Lymphadenopathy:    He has no cervical adenopathy.  Neurological: He is alert and oriented to person, place, and time. He has  normal reflexes. No cranial nerve deficit. Coordination normal.  Skin: Skin is warm and dry. No rash noted. He is not diaphoretic. No erythema. No pallor.  Psychiatric: He has a normal mood and affect. His behavior is normal. Judgment and thought content normal.  Nursing note and vitals reviewed.   Filed Vitals:   12/30/14 1218  BP: 108/64  Pulse: 67  Height: 5\' 8"  (1.727 m)  Weight: 168 lb (76.204 kg)  SpO2: 95%  pulse ox ? RA r O2          Assessment & Plan:     ICD-9-CM ICD-10-CM   1. Interstitial lung disease 515 J84.9 Pulmonary Function Test     ANA     Rheumatoid factor     Cyclic citrul peptide antibody, IgG     CBC     Basic Metabolic Panel (BMET)     B Nat Peptide     Magnesium     Phosphorus     Troponin I     CANCELED: D-Dimer, Quantitative  2. Chronic respiratory failure with hypoxia 518.83 J96.11    799.02    3. Atypical chest pain 786.59 R07.89     #ILD with chronic respiratory failure - My concern is that he has IPF. Also, I suspect IPF/ILD is major component right now. Dx of IPF is based on age, male sex and non-specific occ expo but with UIP pattern CT  - - deteriorated clinically is most likely scenario  PLAN - HRCT and PFT for disease monitoring; last almost a year ago  - autoimmune workup - ig negative can substantiate IPF dx   #Atypical chest pain = NEW  - unclear if he has pericarditis or is just pleuritis from fibrosis related inspiration - will see what blood work, pFT and CT show - if negative might need repeat echo/cards eval  Dr. Brand Males, M.D., [facility].C.P Pulmonary and Critical Care Medicine Staff Physician [place] [facility] Pager: [phone], If no answer or between  15:00h - 7:00h: call [phone]  12/31/2014 4:15 PM

## 2014-12-30 NOTE — Patient Instructions (Addendum)
ICD-9-CM ICD-10-CM   1. Interstitial lung disease 515 J84.9   2. Chronic respiratory failure with hypoxia 518.83 J96.11    799.02     #ILD with chronic respiratory failure - deteriorated clinically  Plan  - do full PFT  - do blood work ANA, Rheumatoid Factor, CCP, CBC, BMet, BNP, mag, phosr, troponin - based on blood work results will order CT chest - will need to decide CT protocol   Followup  - when and whom depending on test results

## 2014-12-31 ENCOUNTER — Other Ambulatory Visit (INDEPENDENT_AMBULATORY_CARE_PROVIDER_SITE_OTHER): Payer: Commercial Managed Care - HMO

## 2014-12-31 ENCOUNTER — Telehealth: Payer: Self-pay | Admitting: Internal Medicine

## 2014-12-31 DIAGNOSIS — J849 Interstitial pulmonary disease, unspecified: Secondary | ICD-10-CM

## 2014-12-31 LAB — BASIC METABOLIC PANEL
BUN: 24 mg/dL — ABNORMAL HIGH (ref 6–23)
CALCIUM: 9.7 mg/dL (ref 8.4–10.5)
CO2: 27 meq/L (ref 19–32)
Chloride: 105 mEq/L (ref 96–112)
Creatinine, Ser: 1.18 mg/dL (ref 0.40–1.50)
GFR: 62.42 mL/min (ref >60.00)
Glucose, Bld: 101 mg/dL — ABNORMAL HIGH (ref 70–99)
POTASSIUM: 4.6 meq/L (ref 3.5–5.1)
SODIUM: 142 meq/L (ref 135–145)

## 2014-12-31 LAB — CBC
HEMATOCRIT: 40.4 % (ref 39.0–52.0)
Hemoglobin: 13.5 g/dL (ref 13.0–17.0)
MCHC: 33.5 g/dL (ref 30.0–36.0)
MCV: 89.6 fl (ref 78.0–100.0)
PLATELETS: 243 10*3/uL (ref 150.0–400.0)
RBC: 4.51 Mil/uL (ref 4.22–5.81)
RDW: 15.4 % (ref 11.5–15.5)
WBC: 13.9 10*3/uL — ABNORMAL HIGH (ref 4.0–10.5)

## 2014-12-31 LAB — PHOSPHORUS: Phosphorus: 3.6 mg/dL (ref 2.3–4.6)

## 2014-12-31 LAB — BRAIN NATRIURETIC PEPTIDE: Pro B Natriuretic peptide (BNP): 43 pg/mL (ref 0.0–100.0)

## 2014-12-31 LAB — TROPONIN I: TNIDX: 0 ug/l (ref 0.00–0.06)

## 2014-12-31 LAB — MAGNESIUM: Magnesium: 2.4 mg/dL (ref 1.5–2.5)

## 2014-12-31 LAB — RHEUMATOID FACTOR: Rhuematoid fact SerPl-aCnc: <10 IU/mL (ref <=14)

## 2014-12-31 NOTE — Telephone Encounter (Addendum)
  ELise  Cbbc, bmet ok. Autoimmune pending. Hhave them complete PFT and I just ordered the HRCT chest - return to see  Byrum. It is his patient;    Thanks  Dr. Brand Males, M.D., [facility].C.P Pulmonary and Critical Care Medicine Staff Physician [place] [facility] Pager: [phone], If no answer or between  15:00h - 7:00h: call [phone]  12/31/2014 4:17 PM     LAB PULMONARY No results for input(s): PHART, PCO2ART, PO2ART, HCO3, TCO2, O2SAT in the last 168 hours.  Invalid input(s): PCO2, PO2  CBC  Recent Labs Lab 12/31/14 0938  HGB 13.5  HCT 40.4  WBC 13.9*  PLT 243.0    COAGULATION  Recent Labs Lab 12/29/14  INR 2.2    CARDIAC  No results for input(s): TROPONINI in the last 168 hours.  Recent Labs Lab 12/31/14 0938  PROBNP 43.0     CHEMISTRY  Recent Labs Lab 12/31/14 0938  NA 142  K 4.6  CL 105  CO2 27  GLUCOSE 101*  BUN 24*  CREATININE 1.18  CALCIUM 9.7  MG 2.4  PHOS 3.6   Estimated Creatinine Clearance: 45.1 mL/min (by C-G formula based on Cr of 1.18).   LIVER  Recent Labs Lab 12/29/14  INR 2.2     INFECTIOUS No results for input(s): LATICACIDVEN, PROCALCITON in the last 168 hours.   ENDOCRINE CBG (last 3)  No results for input(s): GLUCAP in the last 72 hours.       IMAGING x48h  - image(s) personally visualized  -   highlighted in bold No results found.

## 2015-01-03 ENCOUNTER — Telehealth: Payer: Self-pay | Admitting: Emergency Medicine

## 2015-01-03 ENCOUNTER — Ambulatory Visit (INDEPENDENT_AMBULATORY_CARE_PROVIDER_SITE_OTHER): Payer: Commercial Managed Care - HMO | Admitting: *Deleted

## 2015-01-03 DIAGNOSIS — I480 Paroxysmal atrial fibrillation: Secondary | ICD-10-CM | POA: Diagnosis not present

## 2015-01-03 LAB — ANTI-NUCLEAR AB-TITER (ANA TITER): ANA Titer 1: 1:40 {titer} — ABNORMAL HIGH

## 2015-01-03 LAB — ANA: Anti Nuclear Antibody(ANA): POSITIVE — AB

## 2015-01-03 LAB — CYCLIC CITRUL PEPTIDE ANTIBODY, IGG: Cyclic Citrullin Peptide Ab: <2 U/mL (ref 0.0–5.0)

## 2015-01-03 NOTE — Telephone Encounter (Signed)
lmomtcb x1 

## 2015-01-03 NOTE — Telephone Encounter (Signed)
I called pharm and verified that there is a rx on file for ventolin  There are refills remaining and they will get rf ready for pt to pick up  Spoke with [name] and notified of this and she verbalized understanding

## 2015-01-04 ENCOUNTER — Telehealth: Payer: Self-pay | Admitting: Internal Medicine

## 2015-01-04 ENCOUNTER — Other Ambulatory Visit: Payer: Self-pay | Admitting: Cardiology

## 2015-01-04 NOTE — Telephone Encounter (Signed)
ATC on both numbers provided, unable to leave VM. WCB.

## 2015-01-04 NOTE — Telephone Encounter (Signed)
Please advise PCC;s thanks 

## 2015-01-05 ENCOUNTER — Other Ambulatory Visit: Payer: Commercial Managed Care - HMO

## 2015-01-05 NOTE — Telephone Encounter (Signed)
[facility] - I see in chart where patients CT for 7/29 has been pre-approved is this the same order as the 8/5 CT?  Please advise.

## 2015-01-05 NOTE — Telephone Encounter (Signed)
lmtcb X1 to make aware

## 2015-01-05 NOTE — Progress Notes (Signed)
Remote pacemaker transmission.   

## 2015-01-05 NOTE — Telephone Encounter (Signed)
Called mobile # and VM is full [employer]  Called home # NA WCB

## 2015-01-05 NOTE — Telephone Encounter (Signed)
Yes that auth # is ok Peter Richards

## 2015-01-06 ENCOUNTER — Telehealth: Payer: Self-pay | Admitting: Cardiology

## 2015-01-06 DIAGNOSIS — G4700 Insomnia, unspecified: Secondary | ICD-10-CM

## 2015-01-06 MED ORDER — ZOLPIDEM TARTRATE 5 MG PO TABS: 5.0000 mg | ORAL_TABLET | Freq: Every evening | ORAL | Status: DC | PRN

## 2015-01-06 NOTE — Telephone Encounter (Signed)
RX is for Ambien (zolpidem) 5 mg tablets. Will forward to [name] for approval.

## 2015-01-06 NOTE — Telephone Encounter (Signed)
Called the pharmacy and spoke with Ilona Sorrel. Refilled Ambien 5 mg one tablet by mouth at bedtime as needed for sleep.  #30 w/ 4 refills.

## 2015-01-06 NOTE — Telephone Encounter (Signed)
New message     [facility] calling in regards to prescription for zopadin 5mg   Mindy in refills stated this would need to be taken care off by nurse Please call

## 2015-01-06 NOTE — Telephone Encounter (Signed)
lmomtcb x 2  

## 2015-01-06 NOTE — Telephone Encounter (Signed)
Called and spoke to pt's daughter. Informed her of the results and recs per MR. Pt's daughter requested a sooner appt with RB. Appt made for 8.31.16. HRCT and PFT are scheduled for 8.5.25.  Pt's daughter verbalized understanding and denied any further questions or concerns at this time.

## 2015-01-06 NOTE — Telephone Encounter (Signed)
Okay to refill ambien 

## 2015-01-07 ENCOUNTER — Ambulatory Visit (HOSPITAL_COMMUNITY)
Admission: RE | Admit: 2015-01-07 | Discharge: 2015-01-07 | Disposition: A | Discharge status: Home / Self Care | Payer: Commercial Managed Care - HMO | Source: Ambulatory Visit | Attending: Internal Medicine | Admitting: Internal Medicine

## 2015-01-07 ENCOUNTER — Encounter (HOSPITAL_COMMUNITY): Payer: Self-pay

## 2015-01-07 DIAGNOSIS — J849 Interstitial pulmonary disease, unspecified: Secondary | ICD-10-CM

## 2015-01-07 LAB — PULMONARY FUNCTION TEST
DL/VA % pred: 57 %
DL/VA: 2.5 ml/min/mmHg/L
DLCO UNC % PRED: 20 %
DLCO UNC: 5.66 ml/min/mmHg
FEF 25-75 PRE: 1.39 L/s
FEF 25-75 Post: 1.7 L/sec
FEF2575-%Change-Post: 22 %
FEF2575-%PRED-POST: 115 %
FEF2575-%PRED-PRE: 94 %
FEV1-%Change-Post: 4 %
FEV1-%PRED-POST: 54 %
FEV1-%PRED-PRE: 52 %
FEV1-Post: 1.24 L
FEV1-Pre: 1.2 L
FEV1FVC-%Change-Post: -2 %
FEV1FVC-%Pred-Pre: 125 %
FEV6-%Change-Post: 6 %
FEV6-%PRED-PRE: 43 %
FEV6-%Pred-Post: 46 %
FEV6-POST: 1.44 L
FEV6-Pre: 1.35 L
FEV6FVC-%PRED-PRE: 108 %
FEV6FVC-%Pred-Post: 108 %
FVC-%Change-Post: 6 %
FVC-%PRED-PRE: 40 %
FVC-%Pred-Post: 43 %
FVC-PRE: 1.35 L
FVC-Post: 1.44 L
PRE FEV6/FVC RATIO: 100 %
Post FEV1/FVC ratio: 87 %
Post FEV6/FVC ratio: 100 %
Pre FEV1/FVC ratio: 89 %
RV % pred: 104 %
RV: 2.71 L
TLC % pred: 67 %
TLC: 4.34 L

## 2015-01-07 MED ORDER — ALBUTEROL SULFATE (2.5 MG/3ML) 0.083% IN NEBU
2.5000 mg | INHALATION_SOLUTION | Freq: Once | RESPIRATORY_TRACT | Status: AC
  Administered 2015-01-07: 2.5 mg via RESPIRATORY_TRACT

## 2015-01-07 NOTE — Telephone Encounter (Signed)
lmtcb x3 Will close message per triage protocol.

## 2015-01-09 ENCOUNTER — Telehealth: Payer: Self-pay | Admitting: Internal Medicine

## 2015-01-09 NOTE — Telephone Encounter (Signed)
Peter Richards  I saw him acutely. Autoimmune negative except trace ANA. CT chest shows classic UIP with progression. PFT shows significant decline compated to 11 months ago. 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 I think you are seeing him later this month. Passing message onto you. If you need me/my nurse to pass results onto him we will; just let us know   Thanks  Dr. Brand Males, M.D., [facility].C.P Pulmonary and Critical Care Medicine Staff Physician [place] [facility] Pager: [phone], If no answer or between  15:00h - 7:00h: call [phone]  01/09/2015 8:25 PM

## 2015-01-10 ENCOUNTER — Telehealth: Payer: Self-pay | Admitting: Emergency Medicine

## 2015-01-10 NOTE — Telephone Encounter (Signed)
Called and spoke to pt's daughter, [name]. Pt is requesting to change providers from RB to MR.   RB please advise if ok for pt to switch.   MR please advise if ok to add pt to your schedule.

## 2015-01-10 NOTE — Telephone Encounter (Signed)
This is ok with me  

## 2015-01-10 NOTE — Telephone Encounter (Signed)
[place] with me. First avail is when? CT and PFT show progression of ILD

## 2015-01-10 NOTE — Telephone Encounter (Signed)
Please see if you can work him in 4pm 01/12/15. Discuss with Daneil Dan - actually will send it her way

## 2015-01-10 NOTE — Telephone Encounter (Signed)
Please advise Dr Chase Caller. Thanks.

## 2015-01-10 NOTE — Telephone Encounter (Signed)
Your first available is 02/24/15 Is this too far away?

## 2015-01-10 NOTE — Telephone Encounter (Signed)
Will have Peter Richards or [place] add pt.

## 2015-01-11 ENCOUNTER — Telehealth: Payer: Self-pay | Admitting: Emergency Medicine

## 2015-01-11 NOTE — Telephone Encounter (Signed)
Spoke with pt, requesting results from ct chest and labs.  Pt has appt with MR tomorrow afternoon.   MR please advise on results.  Thanks!

## 2015-01-11 NOTE — Telephone Encounter (Signed)
Called and spoke to pt's daughter. Appt made with MR on 8.10.16 at 4:00pm. Pt's daughter verbalized understanding and denied any further questions or concerns at this time.

## 2015-01-11 NOTE — Telephone Encounter (Signed)
Opened in error

## 2015-01-12 ENCOUNTER — Encounter: Payer: Self-pay | Admitting: Internal Medicine

## 2015-01-12 ENCOUNTER — Ambulatory Visit (INDEPENDENT_AMBULATORY_CARE_PROVIDER_SITE_OTHER): Payer: Commercial Managed Care - HMO | Admitting: Internal Medicine

## 2015-01-12 VITALS — BP 112/64 | HR 73 | Ht 68.0 in | Wt 169.0 lb

## 2015-01-12 DIAGNOSIS — F411 Generalized anxiety disorder: Secondary | ICD-10-CM

## 2015-01-12 DIAGNOSIS — J9611 Chronic respiratory failure with hypoxia: Secondary | ICD-10-CM | POA: Diagnosis not present

## 2015-01-12 DIAGNOSIS — J84112 Idiopathic pulmonary fibrosis: Secondary | ICD-10-CM | POA: Diagnosis not present

## 2015-01-12 MED ORDER — ALPRAZOLAM 0.25 MG PO TABS: 0.2500 mg | ORAL_TABLET | Freq: Every day | ORAL | Status: DC | PRN

## 2015-01-12 NOTE — Telephone Encounter (Signed)
Will discuss face to face that is why they are coming in

## 2015-01-12 NOTE — Progress Notes (Signed)
Subjective:    Patient ID: Peter Richards, male    DOB: April 02, 1929, 81 y.o.   MRN: [id number]  HPI   81 yo man, former smoker (150 pk-yrs), hx CAD/CABG ('02), A Fib on amiodarone for the last 4 years, was stopped 1 month ago, followed by Peter Richards. He has been experiencing progressive exertional SOB for over 3 months such that he had to stop to rest with walking about 75 feet. He has also had some exertional CP that has responded to NTG, but the episodes of dyspnea haven't always occurred at same time as the CP. He has a cough, bothering him for about a year. He hears wheezing at times, seems to come at random, not necessarily with exertion.  Hurts sometimes to take a deep breath. Denies any sx or RA, SLE, etc.   ROV 06/10/12 -- follows up for dyspnea, presumed to be multifactorial. He has ILD on CT scan chest as well as obstruction on PFT. We started Spiriva last time to see if he would benefit >> he believes that he is less SOB. He hasn't exerted significantly, but his usual daily activities are easier. His wheeze is better. Last time he desaturated with ambulation, but wanted to defer. He has intermittent aspiration sx >> swallow precautions recommended 04/2011: 1. Consider esophageal assessment 2. Regular textures & Thin liquids 3. Compensations: Hard cough after swallow 4. Upright 30-60 min after meal;Seated upright 90 degrees  ROV 12/11/13 -- follow up visit for mixed disease > COPD + ILD on CT scan, suspected related to aspiration and silica exposure. He has been experiencing more dyspnea over the last 2-3 months. He has a new persistent cough associated with mid-chest discomfort. This is mid-sternal, intermittent. Has been associated with exertion, with his cough. No change with meals. He sees Peter Richards for A fib / pacer, but has not talked to him about the CP. Pt has only taking Spiriva prn, about every week.  >stop Spiriva  Follow up 01/04/14 --  Pt was seen last month for follow  up for COPD and ILD(suspected aspiration and silica exposure)  Repeat CT chest done on 7/22 showed progressed ILD changes .  We reveiwed these results in detail and questions were answered .  Previous PFT showed mixed disease in 2013.  Says he gets winded with walking and has to stop and rest.  Today in office ambulatory walk showed drop in sats 85% on RA. At rest 92% on RA.  We discussed his option and he agrees to begin O2 with activity and. At bedtime .  He remains on Spiriva daily .  No hemoptysis ,orthopnea, exertional chest pain , edema or fever. No flare of cough.   ROV 02/03/14 -- follow up for ILD and COPD. PFT done 9/2 > mixed disease, FEV1 67% pred. We had attempted stopping Spiriva at one point. He hasn't seen Peter Richards yet about his CP > happens with exertion, whenever he is dyspneic. He started O2 last month > believes it has helped his dyspnea and CP.  He stopped taking the spiriva last month when he started the oxygen.  He uses prn and rarely.  He was on nexium for a week for cough.> stopped it, wonders about restarting.   ROV 07/13/14 -- follows for his hx ILD and COPD. Last time we added back Spiriva once a day, but he is only using occasionally prn.  Was concerned that he was having exertional chest pain. He has followed with Peter.  [place] and a stress test showed no reversible ischemia in 03/03/14. He continues to have CP with a deep breath, not necessarily exertion. He is using O2 at night, but using during the day prn.   ROV 10/12/14 -- follow-up for interstitial lung disease and COPD. Her last visit we discussed taking his Spiriva every day reliably. He He has stable CP with a deep breathing, his exertional SOB is worse than last visit.  He is on 2L/min at all times (? Whether this is enough). He takes albuterol (? Duonebs) about once a day, doesn't notice much benefit.   ROV 11/16/14 -- follow-up visit for COPD and interstitial lung disease with associated hypoxemia. At our last  visit I treated him with another week of levofloxacin to ensure that he did not have any residual pneumonia superimposed on his interstitial scarring. We also discussed taking Spiriva reliably every day. He has not been using his O2 reliably with exertion - finds it more of a burden than a help. He restarted spiriva, he believes it has been helpful. He uses SABA every other day. Needs refill on the SABA.   12/27/2014 Acute OV Peter Richards OV -COPD /ILD with chronic resp failure on O2 Pt presents for acute office visit.  Says he feels his breathing is not getting any better, DOE is getting worse over last year.  Says he is using his spiriva daily and wearing oxygen 2l/m but does feel it helps.  We looked at his xray with diffuse ILD changes.  He was seen by cards 2 weeks ago , ?edema on cxr , started on lasix but did not seem to help.  Denies increased cough or congestion  Says he has  A dry cough mostly couple of times a day.  No chest pain, orthopnea, edema or fever or hemoptysis .  Walking in office today on 2l/m of oxygen with no desats.  Uses his albuterol several times a day , says he tries to see if he can do more  We discussed correct use of rescue inhaler.    Rx Begin symbicort Continue spiriva  OV 12/30/2014  Chief Complaint  Patient presents with  . Acute Visit    Pt seen on 7.25.16 by Peter Richards for an acute visit and has not improved since OV. Pt c/o increase in SOB, dry cough, midsternal CP when pt coughs. Pt denies f/c/s and chest congestion.    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 Presents with his daughter acutely. Dtr DPOA and very worried about his progerssion   Reports for the last 3-4 months he's having progressive dyspnea and cough associated with pleuritic frontal chest pain. Symptoms are rated as moderate-severe. The course is progressive. For  shortnessof breath Worsened with exertion. Shortness of breath Relieved by rest but o pain is worsened with deep inspiration and appears unrelated to exertion. This no fever or chills or edema or paroxysmal nocturnal dyspnea. He was only on nocturnal oxygen for the past one year but for the last 2 months he is on daily continuous oxygen. He is frustrated by his decline.  Several days ago he saw nurse practitioner. Symbicort was added to his Spiriva but this has not helped a medicine acute visit today  Review of his chart  - Pulmonary function test 02/03/2014 personally reviewed consistent with restriction FVC is 1.9 L/56%, total lung capacity 55% and DLCO 7.3/39%.  At that time this was moderate-severe restriction  - High resolution CT scan of the chest 12/22/2013: CTs to m personally visualized CT pattern is consistent with UIP pattern. 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 His smoking use included Cigarettes. He has a 150 pack-year smoking history. His smokeless tobacco use includes Snuff.  - Unclear amiodarone history though does have arrhythmia  problems  Lab work - INR 2.2 12/29/2014 - 08/24/2014: Creatinine 0.97 mg percent - 08/24/2014: Hemoglobin 12.4 g percent  Echocardiogram 10/06/2013: Left ventricular ejection fraction 55%     OV 01/12/2015  Follow-up of test results and discussion of diagnosis and future treatment plan. He has switched from Peter. Baltazar Apo to myself Peter. Chase Caller for interstitial lung disease. He presents with his daughter as before.  ILD workup shows  Autoimmune negative except trace ANA. CT chest shows classic UIP with progression. PFT shows significant decline compated to 11 months ago. I think he has progressive IPF and is now severe disease category.    Objectively He says that he feels extremely anxious with significant shortness of breath. He is asking specifically something for anxiety. Daughter also states that they are moving the patient to another home and feels that temporarily something needs to be done to alleviate dyspnea especially consideration for prednisone. They want to know comprehensive management options including disease specific treatment and symptom relief     .  has a past medical history of Hyperlipidemia; Orthostatic hypotension; Bradycardia; Syncope; Myocardial infarction; CAD (coronary artery disease); Hypertension; Stroke; GERD (gastroesophageal reflux disease); Headache(784.0); Arthritis; Aortic stenosis, mild; Chronic anticoagulation; Depression; AAA (abdominal aortic aneurysm); Heart attack; Pacemaker; History of cervical spinal surgery; HOH (hard of hearing); Prostate cancer; Basal cell adenocarcinoma; Bladder cancer; Attention to urostomy; Atrial fibrillation; Exertional shortness of breath; Chronic radicular pain of lower back; Anxiety; and COPD (chronic obstructive pulmonary disease).   reports that he quit smoking about 36 years ago. His smoking use included Cigarettes. He has a 150 pack-year smoking history. His smokeless tobacco use includes Snuff.  Past Surgical History  Procedure Laterality Date  . Insert / replace / remove pacemaker      11/2009  Peter BRODIE    . Prostatectomy      BLADDER ALSO REMOVED (OSTOMY BAG)  2006  FOR CANCER     . Abdominal aortic aneurysm repair  1999    STENT PLACED    . Cataract extraction w/ intraocular lens  implant, bilateral    . Appendectomy      1959  . Revision urostomy cutaneous    . Revision urostomy cutaneous  2006    Urostomy bag placed on right lower abdomen  . Vertebroplasty  08/22/2011    Procedure: VERTEBROPLASTY;  Surgeon: Winfield Cunas, MD;  Location: [facility];  Service: Neurosurgery;  Laterality: N/A;  Lumbar Two vertebroplasty  . Cholecystectomy       4 YRS AGO  . Posterior lumbar fusion  11/06/2012  . Coronary artery bypass graft  1999    CABG X2  . Skin cancer excision      "bottom lip" (11/06/2012)  . Bladder surgery      "for cancer" (11/06/2012)  . Lumbar laminectomy/decompression microdiscectomy Bilateral 11/06/2012    Procedure: LUMBAR LAMINECTOMY/DECOMPRESSION MICRODISCECTOMY;  Surgeon: Sinclair Ship, MD;  Location: [facility]  OR;  Service: Orthopedics;  Laterality: Bilateral;  Lumbar 2-5 decompression    Allergies  Allergen Reactions  . Codeine Nausea And Vomiting    Immunization History  Administered Date(s) Administered  . Influenza Split 04/04/2012, 03/04/2013  . Influenza,inj,Quad PF,36+ Mos 03/04/2014  . Pneumococcal-Unspecified 11/02/2013  . Tdap 11/02/2013    Family History  Problem Relation Age of Onset  . Cancer Father     stomach  . Cancer Sister     lung  . Heart disease Sister   . Hypertension Sister   . Heart disease Mother   . Hypertension Mother   . Heart attack Neg Hx   . Stroke Neg Hx      Current outpatient prescriptions:  .  albuterol (PROVENTIL HFA;VENTOLIN HFA) 108 (90 BASE) MCG/ACT inhaler, Inhale 2 puffs into the lungs every 6 (six) hours as needed for wheezing or shortness of breath., Disp: 1 Inhaler, Rfl: 5 .  budesonide-formoterol (SYMBICORT) 160-4.5 MCG/ACT inhaler, Inhale 2 puffs into the lungs 2 (two) times daily., Disp: 1 Inhaler, Rfl: 5 .  escitalopram (LEXAPRO) 20 MG tablet, Take 20 mg by mouth daily., Disp: , Rfl:  .  fentaNYL (DURAGESIC - DOSED MCG/HR) 25 MCG/HR patch, Place 1 patch (25 mcg total) onto the skin every 3 (three) days., Disp: 5 patch, Rfl: 0 .  HYDROcodone-acetaminophen (NORCO/VICODIN) 5-325 MG per tablet, Take 1 tablet by mouth every 6 (six) hours as needed for moderate pain or severe pain., Disp: , Rfl:  .  ipratropium-albuterol (DUONEB) 0.5-2.5 (3) MG/3ML SOLN, Take 3 mLs by nebulization every 4 (four) hours as needed., Disp: 360 mL, Rfl: 5 .  nitroGLYCERIN  (NITROSTAT) 0.4 MG SL tablet, Place 0.4 mg under the tongue every 5 (five) minutes as needed for chest pain (MAX 3 TABLETS)., Disp: , Rfl:  .  PRESCRIPTION MEDICATION, Wears 2 L of O2 at night, Disp: , Rfl:  .  simvastatin (ZOCOR) 10 MG tablet, Take 10 mg by mouth every evening., Disp: , Rfl:  .  tiotropium (SPIRIVA) 18 MCG inhalation capsule, Place 1 capsule (18 mcg total) into inhaler and inhale daily as needed (for shortness of breath)., Disp: 90 capsule, Rfl: 3 .  VOLTAREN 1 % GEL, Apply 2 g topically daily as needed (pain). , Disp: , Rfl:  .  warfarin (COUMADIN) 5 MG tablet, Take 5-7.5 mg by mouth every evening. Take 7.5mg  on Monday, Wednesday and Friday. Take 5mg  all other days of the week., Disp: , Rfl:  .  zolpidem (AMBIEN) 5 MG tablet, Take 1 tablet (5 mg total) by mouth at bedtime as needed for sleep., Disp: 30 tablet, Rfl: 4 .  alendronate (FOSAMAX) 70 MG tablet, Take 1 tablet by mouth once a week., Disp: , Rfl:  .  furosemide (LASIX) 20 MG tablet, Take 1 tablet (20 mg total) by mouth daily as needed (increased shortness of breath). (Patient not taking: Reported on 01/12/2015), Disp: 30 tablet, Rfl: 3 .  metoprolol succinate (TOPROL-XL) 25 MG 24 hr tablet, Take 0.5 tablets (12.5 mg total) by mouth daily., Disp: 45 tablet, Rfl: 3 .  NON FORMULARY, Alendronate sodium 70 mg 1 per week, Disp: , Rfl:        Review of Systems  Constitutional: Negative for fever and unexpected weight change.  HENT: Positive for trouble swallowing. Negative for congestion, dental problem, ear pain, nosebleeds, postnasal drip, rhinorrhea, sinus pressure, sneezing and sore throat.   Eyes: Negative for redness and itching.  Respiratory: Positive for chest tightness, shortness of breath  and wheezing. Negative for cough.   Cardiovascular: Negative for palpitations and leg swelling.  Gastrointestinal: Negative for nausea and vomiting.  Genitourinary: Negative for dysuria.  Musculoskeletal: Negative for joint  swelling.  Skin: Negative for rash.  Neurological: Negative for headaches.  Hematological: Bruises/bleeds easily.  Psychiatric/Behavioral: Negative for dysphoric mood. The patient is not nervous/anxious.        Objective:   Physical Exam  Filed Vitals:   01/12/15 1600  BP: 112/64  Pulse: 73  Height: 5\' 8"  (1.727 m)  Weight: 169 lb (76.658 kg)  SpO2: 97%    Focal exam only  - crackles  - deconditioned - o2 on    mainly a discussion visit Assessment & Plan:     ICD-9-CM ICD-10-CM   1. IPF (idiopathic pulmonary fibrosis) 516.31 J84.112   2. Chronic respiratory failure with hypoxia 518.83 J96.11    799.02    3. Anxiety state 300.00 F41.1      IPF (idiopathic pulmonary fibrosis) Chronic respiratory failure with hypoxia   - You have a specific type of pulmonary fibrosis called idiopathic pulmonary fibrosis  - probably related to occupational exposure - Your lung function is declined between 30/50% and a span of 11 months since September 2015 - Please take Take prednisone 40mg  once daily x 3 days, then 30mg  once daily x 3 days, then 20mg  once daily x 3 days, then prednisone 10mg  once daily  x 3 days and  Then 5mg  daily x 3 days and stop   - Prednisone being given in case there is an element of a flareup -Continue oxygen - keep pulse ox greater than 88%; if needed by a pulse oximeter - Recommend home physical therapy - Start Prifenidone (you do not qualify for Ofev due to blood thnner presence)   - Both drugs OFEV and Esbriet only slow down progression, 1 out of 6 patients  - this means extension in quality of life but no difference in symptoms  - no study directly compares the 2 drugs but efficacy roughly equal at 1 year time point  - ESBRIET  - 3 pill three times daily, slow titration.  - Need to wean sunscreen  - Some chance of nausea and anorexia with small chance for diarrhea  - no known heart attack risk - no known bleeding risk,   - need monthly blood work for  6 months - monitor liver function - possible mortality benefit in pooled analysis  - larger world wide experience  Disclosure: Peter Chase Caller has participated in trials with Esbriet and has been compensated by [employer]uford Kaufmann / [employer]      - AT future discuss PFF , goals of care. pulm rehab    Anxiety state - start 0.25mg  tablets as needed x not more than 1 per day  -= 10 tab only, no refill - Might consider opioid dyspnea relief in the future  FOllowup  - 4 weeks   > 50% of this > 25 min visit spent in face to face counseling or coordination of care    Peter. Brand Males, M.D., [facility].C.P Pulmonary and Critical Care Medicine Staff Physician [place] [facility] Pager: [phone], If no answer or between  15:00h - 7:00h: call [phone]  01/18/2015 12:24 PM

## 2015-01-12 NOTE — Patient Instructions (Addendum)
IPF (idiopathic pulmonary fibrosis) Chronic respiratory failure with hypoxia   - You have a specific type of pulmonary fibrosis called idiopathic pulmonary fibrosis  - probably related to occupational exposure - Your lung function is declined between 30/50% and a span of 11 months since September 2015 - Please take Take prednisone 40mg  once daily x 3 days, then 30mg  once daily x 3 days, then 20mg  once daily x 3 days, then prednisone 10mg  once daily  x 3 days and  Then 5mg  daily x 3 days and stop -Continue oxygen - keep pulse ox greater than 88%; if needed by a pulse oximeter - Recommend home physical therapy - Start Prifenidone (you do not qualify for Ofev due to blood thnner presence)   - Both drugs OFEV and Esbriet only slow down progression, 1 out of 6 patients  - this means extension in quality of life but no difference in symptoms  - no study directly compares the 2 drugs but efficacy roughly equal at 1 year time point  - ESBRIET  - 3 pill three times daily, slow titration.  - Need to wean sunscreen  - Some chance of nausea and anorexia with small chance for diarrhea  - no known heart attack risk - no known bleeding risk,   - need monthly blood work for 6 months - monitor liver function - possible mortality benefit in pooled analysis  - larger world wide experience  Disclosure: Dr Chase Caller has participated in trials with Esbriet and has been compensated by [employer]uford Kaufmann / [employer]so Amy     Anxiety state - start 0.25mg  tablets as needed x not more than 1 per day  -= 10 tab only, no refill  FOllowup  - 4 weeks

## 2015-01-12 NOTE — Telephone Encounter (Signed)
Spoke with the pt and notified of recs per MR  He will keep appt today at 4 pm  Nothing further needed

## 2015-01-13 ENCOUNTER — Telehealth: Payer: Self-pay | Admitting: Internal Medicine

## 2015-01-13 MED ORDER — PREDNISONE 10 MG PO TABS: ORAL_TABLET | ORAL | Status: DC

## 2015-01-13 NOTE — Telephone Encounter (Signed)
Spoke with pt. Aware RX has been sent. Nothing further needed 

## 2015-01-15 LAB — CUP PACEART REMOTE DEVICE CHECK
Battery Impedance: 178 Ohm
Battery Remaining Longevity: 125 mo
Battery Voltage: 2.79 V
Brady Statistic AP VP Percent: 0 %
Brady Statistic AP VS Percent: 80 %
Brady Statistic AS VS Percent: 20 %
Date Time Interrogation Session: 20160801123140
Lead Channel Impedance Value: 483 Ohm
Lead Channel Impedance Value: 716 Ohm
Lead Channel Sensing Intrinsic Amplitude: 8 mV
Lead Channel Setting Pacing Amplitude: 2 V
Lead Channel Setting Pacing Amplitude: 2.5 V
MDC IDC MSMT LEADCHNL RA PACING THRESHOLD AMPLITUDE: 0.625 V
MDC IDC MSMT LEADCHNL RA PACING THRESHOLD PULSEWIDTH: 0.4 ms
MDC IDC SET LEADCHNL RV PACING PULSEWIDTH: 1 ms
MDC IDC SET LEADCHNL RV SENSING SENSITIVITY: 4 mV
MDC IDC STAT BRADY AS VP PERCENT: 0 %

## 2015-01-17 ENCOUNTER — Telehealth: Payer: Self-pay | Admitting: Internal Medicine

## 2015-01-17 DIAGNOSIS — J84112 Idiopathic pulmonary fibrosis: Secondary | ICD-10-CM

## 2015-01-17 NOTE — Telephone Encounter (Signed)
I called spoke with pt. He reports when he saw MR on 8/10 he declined being set up with home PT. He is now wanting to do this through [facility]. Please advise MR thanks

## 2015-01-17 NOTE — Telephone Encounter (Signed)
Order entered for PT at home  Patient notified. Nothing further needed.

## 2015-01-17 NOTE — Telephone Encounter (Signed)
Yes set up home PT

## 2015-01-18 ENCOUNTER — Other Ambulatory Visit: Payer: Self-pay | Admitting: Internal Medicine

## 2015-01-18 DIAGNOSIS — J84112 Idiopathic pulmonary fibrosis: Secondary | ICD-10-CM

## 2015-01-18 MED ORDER — PREDNISONE 10 MG PO TABS
ORAL_TABLET | ORAL | Status: DC
Start: 2015-01-18 — End: 2015-02-21

## 2015-01-21 ENCOUNTER — Telehealth: Payer: Self-pay | Admitting: Internal Medicine

## 2015-01-21 NOTE — Telephone Encounter (Signed)
Patient is chronic orthostatic, has not been tested before, [name] says that he tested patient and patient went from 130/80 -  60/50  Drops oxygen saturation on room air - not good with keeping his oxygen on - wants to know if patient needs to wear oxygen 24/7, says that patient did not wear oxygen in the shower and almost  passed out before getting out of shower because his oxygen level dropped.  Requesting that [name] update medication list, there were a few medications that were not on their most recent list and he would like to have a completed list faxed to him at Fax: [phone] Attn: Herbert Deaner  He is recommending OT for ADL's and speech therapy.  However, patient is requesting that we put a hold on the speech therapy because he says that he is "overwhelmed"

## 2015-01-21 NOTE — Telephone Encounter (Signed)
Seven medications that are different than the most recent visit print out he provided.  1. Oyxgen per pt is 24/7.  Patient does D-Sat on room air. Currently listed as night-time only. 2. Atorvastatin 10 mg daily. 3. Trazodone 50 mg qpm 4. B12 1,000 mg qd 5. Tamadine HCL 4 mg bid 6. Buspirone 5 mg qd 7. Final days of taking prednisone prescribed by [name].  Based on evaluation, he is recommending occupational for ADL's, speech therapy for dementia.  However, patient is requesting a hold on speech therapy.

## 2015-01-21 NOTE — Telephone Encounter (Signed)
PA has been initiated for Esbriet 267mg . Pt's [employer] ID: [id number] PA #: [id number] Decision will be made within 72 hour.

## 2015-01-24 NOTE — Telephone Encounter (Signed)
Lots of issues  1. Passing out - this probably related to low BP and / or low o2 - > He needs to get eval for this ASAP with pcp Peter Argyle, MD   Or his cardiologist Dr Peter Richards. Ok for him to use o2, 24/7 2L [place] continuous. IF due to low BP this can dangerous  2. Coumadin - if he is falling and weak and trying to pass out - he has to stop his coumadin which he is on for A Fib till the issue of orthostasis and fall risk is resolved  3. Peter Richards for their request on med list  4. Peter Richards for OT  5. Ok to hold speech therapy because of patient being "overwhelmed"

## 2015-01-24 NOTE — Telephone Encounter (Signed)
[name] - see message below - please advise.

## 2015-01-24 NOTE — Telephone Encounter (Signed)
Called made [name] w/ [facility] aware of below. He also wants [place] to call the patient and let him know he needs to f/u with PCP or his cardiologists ASAP.   I called pt and made aware of this to get an appt ASAP. He will do so. Nothing further needed

## 2015-01-26 ENCOUNTER — Telehealth: Payer: Self-pay | Admitting: Internal Medicine

## 2015-01-26 NOTE — Telephone Encounter (Signed)
Received call back from [place]. Pt and daughter have discussed and want to hold off on starting Esbriet until they see [name] again in late September (9.26.16). [name] states she will call specialty pharmacy and let them know.   Will forward to MR as FYI.

## 2015-01-26 NOTE — Telephone Encounter (Signed)
Patient stated our office told him to fu with PCP or cardiologist and he refuses due to being 45 min away.  Patient stated there was a nurse who could check his blood pressure in his community. Phys therapist strongly encouraged to follow up.

## 2015-01-26 NOTE — Telephone Encounter (Signed)
This is fine to hold off esbriet.. Message closed

## 2015-01-26 NOTE — Telephone Encounter (Signed)
Pt had returned call per another phone note. Called pt back and LMTCB

## 2015-01-26 NOTE — Telephone Encounter (Signed)
See prior phone note (not closed). Will sign off this message

## 2015-01-26 NOTE — Telephone Encounter (Signed)
LMTC x 1  

## 2015-01-26 NOTE — Telephone Encounter (Signed)
Received fax from [place] until 8.20.2018. LMTCB for pt's daughter [name].

## 2015-01-27 NOTE — Telephone Encounter (Signed)
Pt returned call  [phone]

## 2015-01-27 NOTE — Telephone Encounter (Signed)
Spoke with [name] with [facility].  Discussed below per MR.  [name] verbalized understanding, is in agreement, and voiced no further questions or concerns at this time.  lmomtcb for pt to remind him of pending sept appt with MR.

## 2015-01-27 NOTE — Telephone Encounter (Signed)
Ok - is patient choice. Respect his desire to decline whatever he wants to decline. Definitely no esbriet/ofev in this situation. Wills ee him sept for fu and address furter

## 2015-01-27 NOTE — Telephone Encounter (Signed)
LMTCB x 1 for [facility]

## 2015-01-27 NOTE — Telephone Encounter (Signed)
Spoke with [name], PT @ [facility] had recommended PT, OT, and ST for pt following an order placed for PT.  Pt is declining OT and speech therapy- does not see the benefit in anything besides strengthening exercises.   Pt states that there is a Designer, television/film set that can check his BP since he did not want to follow up with PCP or cardiology d/t offices being 45 minutes away from home.   Yesterday pt's bp dropped yesterday upon standing 110/70 to 60/50-no dizziness.  Pt has automatic cuff at home as well.    MR please advise on recs.  Thanks!

## 2015-01-28 NOTE — Telephone Encounter (Signed)
That is fine for PT.

## 2015-01-28 NOTE — Telephone Encounter (Signed)
[name] returned call.  Patient cancelled visit for this week stating he was sick with diarrhea.  Patient advised to call PCP.  Requesting verbal order to make up visit at the end of current visits.

## 2015-01-28 NOTE — Telephone Encounter (Signed)
Called and spoke to Peter Richards. Informed him of OK per MR for PT visits. [name] verbalized understanding and denied any further questions or concerns at this time.

## 2015-01-28 NOTE — Telephone Encounter (Signed)
Spoke with [facility], states that he needs a VO for PT - Verbal order would be to okay the patient to make up any missed visits during this course of therapy. Please advise MR thanks.

## 2015-01-31 ENCOUNTER — Telehealth: Payer: Self-pay | Admitting: Internal Medicine

## 2015-01-31 NOTE — Telephone Encounter (Signed)
lmomtcb for Peter Richards with [facility] with pt.  Advised we would hold off on further orders as discussed below for now and MR would talk with him further during Sept 2016 appt.  Pt verbalized understanding, is in agreement with this, and is to call back if he decides otherwise or needs to be seen sooner prior to pending Sept appt.

## 2015-01-31 NOTE — Telephone Encounter (Signed)
Called and spoke to [place] with [facility] PT. [name] is requesting skilled nursing to evaluate only right now and then if needed will call with a plan of care. [name] also stated the pt is not feeling well with increase SOB. Called and spoke to pt. Pt c/o increased weakness and increase DOE. Pt states he has a mild non prod cough - at baseline. Pt denies f/c/s, discolored mucus, swelling, hemoptysis. Pt denies CP within the last week but has had to use SL nitro within the last 2 weeks d/t CP.   [name] please advise on pt's s/s and VO for skilled nursing eval only. Thanks.

## 2015-01-31 NOTE — Telephone Encounter (Signed)
Peter Richards returning call.[name]

## 2015-01-31 NOTE — Telephone Encounter (Signed)
[place] for Skilled nursing  Regarding chest pain and other symptoms - hard to figure out. If worsens dramatically go to ER. Otherwise will see at fu sept 2016. Is he on lasix?

## 2015-01-31 NOTE — Telephone Encounter (Signed)
Ok fine. cAlncel PT, OT, Skilled nursing etc., I will address his goals  Of care and prognosis at fu

## 2015-01-31 NOTE — Telephone Encounter (Signed)
Called spoke with [name] w/ [facility]. Aware of MR recs and pt is aware. He verbalized understanding and needed nothing further

## 2015-01-31 NOTE — Telephone Encounter (Signed)
LMTC x 1 for [name] with [facility]

## 2015-01-31 NOTE — Telephone Encounter (Signed)
Spoke with patient, he is refusing skilled nursing care right now.  He does not want Peter Richards to come out and work with him anymore.  Patient says that he has Lasix that [name] has given him, but he is not taking it.  He says that he is not swelling, so he does not need it.  Patient says that he does not want to do any Occupational therapy, Speech therapy.  The only thing he is interested in is "Mobility" therapy.  Patient says that he is not having any chest pain, he said he had chest pain 2 weeks ago, took a nitro tablet and the pain went away.  Patient says that he is fine and does not need any help right now.  Patient says that he will not take anything, do anything until he sees [name] in September.  He says that he has a lot that he wants to discuss at that appointment, including Esbriet.   FYI to [name]

## 2015-01-31 NOTE — Telephone Encounter (Signed)
pt contiunes to report feeling sick, diahreah has stopped, SOB has increased. pt request hold on therapy. PT stongly encouraged to call PCP and follow up with pulmonary. Historically pt has reported frustration with 31min drive to dr. PT recommend immediate follow up for vitals. Pt agrees.   Herbert Deaner (physical therapist with [facility]) [phone]

## 2015-02-01 ENCOUNTER — Telehealth: Payer: Self-pay | Admitting: Internal Medicine

## 2015-02-01 NOTE — Telephone Encounter (Signed)
Spoke with pt's daughter, requesting pt's med list to be faxed to below verified fax #.  This has been sent.  Nothing further needed at this time.

## 2015-02-02 ENCOUNTER — Ambulatory Visit: Payer: Commercial Managed Care - HMO | Admitting: Emergency Medicine

## 2015-02-02 ENCOUNTER — Ambulatory Visit (INDEPENDENT_AMBULATORY_CARE_PROVIDER_SITE_OTHER): Payer: Commercial Managed Care - HMO | Admitting: Pharmacist

## 2015-02-02 DIAGNOSIS — I480 Paroxysmal atrial fibrillation: Secondary | ICD-10-CM

## 2015-02-02 DIAGNOSIS — Z5181 Encounter for therapeutic drug level monitoring: Secondary | ICD-10-CM

## 2015-02-02 LAB — POCT INR: INR: 4

## 2015-02-03 ENCOUNTER — Encounter: Payer: Self-pay | Admitting: Cardiology

## 2015-02-08 ENCOUNTER — Ambulatory Visit: Payer: Commercial Managed Care - HMO | Admitting: Emergency Medicine

## 2015-02-15 ENCOUNTER — Ambulatory Visit: Payer: Commercial Managed Care - HMO | Admitting: Emergency Medicine

## 2015-02-15 ENCOUNTER — Encounter: Payer: Self-pay | Admitting: Internal Medicine

## 2015-02-16 ENCOUNTER — Ambulatory Visit (INDEPENDENT_AMBULATORY_CARE_PROVIDER_SITE_OTHER): Payer: Commercial Managed Care - HMO | Admitting: Cardiovascular Disease

## 2015-02-16 DIAGNOSIS — Z5181 Encounter for therapeutic drug level monitoring: Secondary | ICD-10-CM

## 2015-02-16 DIAGNOSIS — I480 Paroxysmal atrial fibrillation: Secondary | ICD-10-CM

## 2015-02-16 LAB — POCT INR: INR: 3

## 2015-02-17 ENCOUNTER — Encounter: Payer: Self-pay | Admitting: Cardiology

## 2015-02-21 ENCOUNTER — Observation Stay (HOSPITAL_COMMUNITY): Payer: Commercial Managed Care - HMO

## 2015-02-21 ENCOUNTER — Observation Stay (HOSPITAL_COMMUNITY)
Admission: EM | Admit: 2015-02-21 | Discharge: 2015-03-02 | Disposition: A | Discharge status: Skilled Nursing Facility | Payer: Commercial Managed Care - HMO | Attending: Internal Medicine | Admitting: Internal Medicine

## 2015-02-21 ENCOUNTER — Emergency Department (HOSPITAL_COMMUNITY): Payer: Commercial Managed Care - HMO

## 2015-02-21 ENCOUNTER — Encounter (HOSPITAL_COMMUNITY): Payer: Self-pay | Admitting: Emergency Medicine

## 2015-02-21 DIAGNOSIS — Z66 Do not resuscitate: Secondary | ICD-10-CM | POA: Diagnosis not present

## 2015-02-21 DIAGNOSIS — W2203XA Walked into furniture, initial encounter: Secondary | ICD-10-CM | POA: Insufficient documentation

## 2015-02-21 DIAGNOSIS — R197 Diarrhea, unspecified: Secondary | ICD-10-CM | POA: Insufficient documentation

## 2015-02-21 DIAGNOSIS — I10 Essential (primary) hypertension: Secondary | ICD-10-CM | POA: Insufficient documentation

## 2015-02-21 DIAGNOSIS — Z7983 Long term (current) use of bisphosphonates: Secondary | ICD-10-CM | POA: Diagnosis not present

## 2015-02-21 DIAGNOSIS — F329 Major depressive disorder, single episode, unspecified: Secondary | ICD-10-CM | POA: Diagnosis not present

## 2015-02-21 DIAGNOSIS — Z79891 Long term (current) use of opiate analgesic: Secondary | ICD-10-CM | POA: Diagnosis not present

## 2015-02-21 DIAGNOSIS — Z7952 Long term (current) use of systemic steroids: Secondary | ICD-10-CM | POA: Insufficient documentation

## 2015-02-21 DIAGNOSIS — R0603 Acute respiratory distress: Secondary | ICD-10-CM | POA: Insufficient documentation

## 2015-02-21 DIAGNOSIS — Z951 Presence of aortocoronary bypass graft: Secondary | ICD-10-CM | POA: Insufficient documentation

## 2015-02-21 DIAGNOSIS — Z950 Presence of cardiac pacemaker: Secondary | ICD-10-CM | POA: Insufficient documentation

## 2015-02-21 DIAGNOSIS — J9611 Chronic respiratory failure with hypoxia: Secondary | ICD-10-CM | POA: Diagnosis not present

## 2015-02-21 DIAGNOSIS — E860 Dehydration: Secondary | ICD-10-CM | POA: Insufficient documentation

## 2015-02-21 DIAGNOSIS — Z791 Long term (current) use of non-steroidal anti-inflammatories (NSAID): Secondary | ICD-10-CM | POA: Insufficient documentation

## 2015-02-21 DIAGNOSIS — J84112 Idiopathic pulmonary fibrosis: Secondary | ICD-10-CM | POA: Diagnosis not present

## 2015-02-21 DIAGNOSIS — I951 Orthostatic hypotension: Secondary | ICD-10-CM | POA: Insufficient documentation

## 2015-02-21 DIAGNOSIS — Y9301 Activity, walking, marching and hiking: Secondary | ICD-10-CM | POA: Diagnosis not present

## 2015-02-21 DIAGNOSIS — Z8673 Personal history of transient ischemic attack (TIA), and cerebral infarction without residual deficits: Secondary | ICD-10-CM | POA: Diagnosis not present

## 2015-02-21 DIAGNOSIS — M810 Age-related osteoporosis without current pathological fracture: Secondary | ICD-10-CM | POA: Diagnosis not present

## 2015-02-21 DIAGNOSIS — I252 Old myocardial infarction: Secondary | ICD-10-CM | POA: Diagnosis not present

## 2015-02-21 DIAGNOSIS — Z936 Other artificial openings of urinary tract status: Secondary | ICD-10-CM | POA: Diagnosis not present

## 2015-02-21 DIAGNOSIS — E785 Hyperlipidemia, unspecified: Secondary | ICD-10-CM | POA: Insufficient documentation

## 2015-02-21 DIAGNOSIS — Z515 Encounter for palliative care: Secondary | ICD-10-CM | POA: Diagnosis not present

## 2015-02-21 DIAGNOSIS — I2510 Atherosclerotic heart disease of native coronary artery without angina pectoris: Secondary | ICD-10-CM | POA: Diagnosis not present

## 2015-02-21 DIAGNOSIS — T148 Other injury of unspecified body region: Secondary | ICD-10-CM | POA: Diagnosis not present

## 2015-02-21 DIAGNOSIS — Z9981 Dependence on supplemental oxygen: Secondary | ICD-10-CM | POA: Diagnosis not present

## 2015-02-21 DIAGNOSIS — M542 Cervicalgia: Secondary | ICD-10-CM | POA: Diagnosis not present

## 2015-02-21 DIAGNOSIS — S22069A Unspecified fracture of T7-T8 vertebra, initial encounter for closed fracture: Principal | ICD-10-CM | POA: Insufficient documentation

## 2015-02-21 DIAGNOSIS — Z8551 Personal history of malignant neoplasm of bladder: Secondary | ICD-10-CM

## 2015-02-21 DIAGNOSIS — Z7901 Long term (current) use of anticoagulants: Secondary | ICD-10-CM | POA: Diagnosis not present

## 2015-02-21 DIAGNOSIS — G8929 Other chronic pain: Secondary | ICD-10-CM | POA: Diagnosis not present

## 2015-02-21 DIAGNOSIS — Z8546 Personal history of malignant neoplasm of prostate: Secondary | ICD-10-CM | POA: Insufficient documentation

## 2015-02-21 DIAGNOSIS — N390 Urinary tract infection, site not specified: Secondary | ICD-10-CM | POA: Diagnosis not present

## 2015-02-21 DIAGNOSIS — I482 Chronic atrial fibrillation: Secondary | ICD-10-CM | POA: Diagnosis not present

## 2015-02-21 DIAGNOSIS — I4891 Unspecified atrial fibrillation: Secondary | ICD-10-CM | POA: Diagnosis not present

## 2015-02-21 DIAGNOSIS — M549 Dorsalgia, unspecified: Secondary | ICD-10-CM

## 2015-02-21 DIAGNOSIS — J449 Chronic obstructive pulmonary disease, unspecified: Secondary | ICD-10-CM | POA: Diagnosis present

## 2015-02-21 DIAGNOSIS — W1830XA Fall on same level, unspecified, initial encounter: Secondary | ICD-10-CM | POA: Insufficient documentation

## 2015-02-21 DIAGNOSIS — F419 Anxiety disorder, unspecified: Secondary | ICD-10-CM | POA: Diagnosis not present

## 2015-02-21 DIAGNOSIS — S32009A Unspecified fracture of unspecified lumbar vertebra, initial encounter for closed fracture: Secondary | ICD-10-CM | POA: Insufficient documentation

## 2015-02-21 DIAGNOSIS — K219 Gastro-esophageal reflux disease without esophagitis: Secondary | ICD-10-CM | POA: Insufficient documentation

## 2015-02-21 DIAGNOSIS — R0600 Dyspnea, unspecified: Secondary | ICD-10-CM | POA: Diagnosis not present

## 2015-02-21 DIAGNOSIS — Z981 Arthrodesis status: Secondary | ICD-10-CM | POA: Diagnosis not present

## 2015-02-21 DIAGNOSIS — Y92003 Bedroom of unspecified non-institutional (private) residence as the place of occurrence of the external cause: Secondary | ICD-10-CM | POA: Diagnosis not present

## 2015-02-21 DIAGNOSIS — I714 Abdominal aortic aneurysm, without rupture: Secondary | ICD-10-CM | POA: Diagnosis not present

## 2015-02-21 DIAGNOSIS — S32000A Wedge compression fracture of unspecified lumbar vertebra, initial encounter for closed fracture: Secondary | ICD-10-CM | POA: Diagnosis not present

## 2015-02-21 DIAGNOSIS — Z87891 Personal history of nicotine dependence: Secondary | ICD-10-CM | POA: Diagnosis not present

## 2015-02-21 DIAGNOSIS — Z7951 Long term (current) use of inhaled steroids: Secondary | ICD-10-CM | POA: Insufficient documentation

## 2015-02-21 DIAGNOSIS — R52 Pain, unspecified: Secondary | ICD-10-CM

## 2015-02-21 DIAGNOSIS — IMO0002 Reserved for concepts with insufficient information to code with codable children: Secondary | ICD-10-CM

## 2015-02-21 DIAGNOSIS — S22060A Wedge compression fracture of T7-T8 vertebra, initial encounter for closed fracture: Secondary | ICD-10-CM | POA: Insufficient documentation

## 2015-02-21 DIAGNOSIS — Z8679 Personal history of other diseases of the circulatory system: Secondary | ICD-10-CM

## 2015-02-21 LAB — CBC WITH DIFFERENTIAL/PLATELET
Basophils Absolute: 0 10*3/uL (ref 0.0–0.1)
Basophils Relative: 0 %
EOS ABS: 0.3 10*3/uL (ref 0.0–0.7)
EOS PCT: 2 %
HCT: 37.7 % — ABNORMAL LOW (ref 39.0–52.0)
Hemoglobin: 12.6 g/dL — ABNORMAL LOW (ref 13.0–17.0)
LYMPHS ABS: 1.9 10*3/uL (ref 0.7–4.0)
Lymphocytes Relative: 12 %
MCH: 29.8 pg (ref 26.0–34.0)
MCHC: 33.4 g/dL (ref 30.0–36.0)
MCV: 89.1 fL (ref 78.0–100.0)
MONO ABS: 1.6 10*3/uL — AB (ref 0.1–1.0)
Monocytes Relative: 10 %
Neutro Abs: 11.9 10*3/uL — ABNORMAL HIGH (ref 1.7–7.7)
Neutrophils Relative %: 76 %
Platelets: 214 10*3/uL (ref 150–400)
RBC: 4.23 MIL/uL (ref 4.22–5.81)
RDW: 15.7 % — ABNORMAL HIGH (ref 11.5–15.5)
WBC: 15.7 10*3/uL — AB (ref 4.0–10.5)

## 2015-02-21 LAB — BASIC METABOLIC PANEL
ANION GAP: 9 (ref 5–15)
BUN: 22 mg/dL — ABNORMAL HIGH (ref 6–20)
CHLORIDE: 102 mmol/L (ref 101–111)
CO2: 26 mmol/L (ref 22–32)
Calcium: 9.1 mg/dL (ref 8.9–10.3)
Creatinine, Ser: 1.1 mg/dL (ref 0.61–1.24)
GFR calc Af Amer: >60 mL/min (ref >60)
GFR calc non Af Amer: 60 mL/min — ABNORMAL LOW (ref >60)
GLUCOSE: 100 mg/dL — AB (ref 65–99)
Potassium: 3.9 mmol/L (ref 3.5–5.1)
Sodium: 137 mmol/L (ref 135–145)

## 2015-02-21 LAB — PROTIME-INR
INR: 3.02 — AB (ref 0.00–1.49)
Prothrombin Time: 30.7 seconds — ABNORMAL HIGH (ref 11.6–15.2)

## 2015-02-21 LAB — URINE MICROSCOPIC-ADD ON

## 2015-02-21 LAB — URINALYSIS, ROUTINE W REFLEX MICROSCOPIC
BILIRUBIN URINE: NEGATIVE
Glucose, UA: NEGATIVE mg/dL
Ketones, ur: NEGATIVE mg/dL
Nitrite: POSITIVE — AB
Protein, ur: 100 mg/dL — AB
Specific Gravity, Urine: 1.019 (ref 1.005–1.030)
UROBILINOGEN UA: 0.2 mg/dL (ref 0.0–1.0)
pH: 8.5 — ABNORMAL HIGH (ref 5.0–8.0)

## 2015-02-21 LAB — TROPONIN I

## 2015-02-21 MED ORDER — ALUM & MAG HYDROXIDE-SIMETH 200-200-20 MG/5ML PO SUSP: 30.0000 mL | Freq: Four times a day (QID) | ORAL | Status: DC | PRN

## 2015-02-21 MED ORDER — SODIUM CHLORIDE 0.9 % IV BOLUS (SEPSIS)
1000.0000 mL | Freq: Once | INTRAVENOUS | Status: AC
  Administered 2015-02-21: 1000 mL via INTRAVENOUS

## 2015-02-21 MED ORDER — METOPROLOL SUCCINATE ER 25 MG PO TB24
25.0000 mg | ORAL_TABLET | Freq: Every day | ORAL | Status: DC
  Administered 2015-02-21 – 2015-03-02 (×9): 25 mg via ORAL
  Filled 2015-02-21 (×9): qty 1

## 2015-02-21 MED ORDER — WARFARIN SODIUM 5 MG PO TABS
2.5000 mg | ORAL_TABLET | Freq: Once | ORAL | Status: AC
Start: 2015-02-21 — End: 2015-02-21
  Administered 2015-02-21: 2.5 mg via ORAL
  Filled 2015-02-21: qty 1

## 2015-02-21 MED ORDER — ALPRAZOLAM 0.25 MG PO TABS
0.2500 mg | ORAL_TABLET | Freq: Every day | ORAL | Status: DC | PRN
  Administered 2015-02-21 – 2015-02-24 (×3): 0.25 mg via ORAL
  Filled 2015-02-21 (×3): qty 1

## 2015-02-21 MED ORDER — ACETAMINOPHEN 325 MG PO TABS: 650.0000 mg | ORAL_TABLET | Freq: Four times a day (QID) | ORAL | Status: DC | PRN

## 2015-02-21 MED ORDER — BUDESONIDE-FORMOTEROL FUMARATE 160-4.5 MCG/ACT IN AERO
2.0000 | INHALATION_SPRAY | Freq: Two times a day (BID) | RESPIRATORY_TRACT | Status: DC
  Administered 2015-02-22 – 2015-02-25 (×7): 2 via RESPIRATORY_TRACT
  Filled 2015-02-21: qty 6

## 2015-02-21 MED ORDER — FENTANYL 25 MCG/HR TD PT72
25.0000 ug | MEDICATED_PATCH | TRANSDERMAL | Status: DC
  Administered 2015-02-22: 25 ug via TRANSDERMAL
  Filled 2015-02-21: qty 1

## 2015-02-21 MED ORDER — HYDROCODONE-ACETAMINOPHEN 5-325 MG PO TABS
1.0000 | ORAL_TABLET | Freq: Four times a day (QID) | ORAL | Status: DC | PRN
  Administered 2015-02-21 – 2015-02-23 (×7): 1 via ORAL
  Filled 2015-02-21 (×7): qty 1

## 2015-02-21 MED ORDER — IPRATROPIUM-ALBUTEROL 0.5-2.5 (3) MG/3ML IN SOLN: 3.0000 mL | RESPIRATORY_TRACT | Status: DC | PRN

## 2015-02-21 MED ORDER — OXYCODONE-ACETAMINOPHEN 5-325 MG PO TABS
1.0000 | ORAL_TABLET | Freq: Once | ORAL | Status: AC
  Administered 2015-02-21: 1 via ORAL
  Filled 2015-02-21: qty 1

## 2015-02-21 MED ORDER — TIOTROPIUM BROMIDE MONOHYDRATE 18 MCG IN CAPS: 18.0000 ug | ORAL_CAPSULE | Freq: Every day | RESPIRATORY_TRACT | Status: DC | PRN

## 2015-02-21 MED ORDER — ESCITALOPRAM OXALATE 10 MG PO TABS
20.0000 mg | ORAL_TABLET | Freq: Every day | ORAL | Status: DC
  Administered 2015-02-21 – 2015-03-02 (×10): 20 mg via ORAL
  Filled 2015-02-21 (×10): qty 2

## 2015-02-21 MED ORDER — HYDROMORPHONE HCL 1 MG/ML IJ SOLN
0.5000 mg | Freq: Once | INTRAMUSCULAR | Status: AC
  Administered 2015-02-21: 0.5 mg via INTRAVENOUS
  Filled 2015-02-21: qty 1

## 2015-02-21 MED ORDER — SODIUM CHLORIDE 0.9 % IV SOLN
Freq: Once | INTRAVENOUS | Status: AC
  Administered 2015-02-21: 11:00:00 via INTRAVENOUS

## 2015-02-21 MED ORDER — DEXTROSE 5 % IV SOLN
1.0000 g | Freq: Once | INTRAVENOUS | Status: AC
  Administered 2015-02-21: 1 g via INTRAVENOUS
  Filled 2015-02-21: qty 10

## 2015-02-21 MED ORDER — ONDANSETRON HCL 4 MG/2ML IJ SOLN
4.0000 mg | Freq: Once | INTRAMUSCULAR | Status: AC
  Administered 2015-02-21: 4 mg via INTRAVENOUS
  Filled 2015-02-21: qty 2

## 2015-02-21 MED ORDER — MORPHINE SULFATE (PF) 4 MG/ML IV SOLN
4.0000 mg | Freq: Once | INTRAVENOUS | Status: AC
  Administered 2015-02-21: 4 mg via INTRAVENOUS
  Filled 2015-02-21: qty 1

## 2015-02-21 MED ORDER — HYDROMORPHONE HCL 1 MG/ML IJ SOLN
0.5000 mg | Freq: Once | INTRAMUSCULAR | Status: AC
  Administered 2015-02-21: 0.5 mg via INTRAVENOUS

## 2015-02-21 MED ORDER — MORPHINE SULFATE (PF) 2 MG/ML IV SOLN
2.0000 mg | INTRAVENOUS | Status: DC | PRN
  Administered 2015-02-21 – 2015-02-22 (×5): 2 mg via INTRAVENOUS
  Filled 2015-02-21 (×6): qty 1

## 2015-02-21 MED ORDER — METOPROLOL SUCCINATE ER 25 MG PO TB24: 12.5000 mg | ORAL_TABLET | Freq: Every day | ORAL | Status: DC

## 2015-02-21 MED ORDER — SIMVASTATIN 5 MG PO TABS
10.0000 mg | ORAL_TABLET | Freq: Every evening | ORAL | Status: DC
  Administered 2015-02-21 – 2015-03-01 (×8): 10 mg via ORAL
  Filled 2015-02-21 (×8): qty 2

## 2015-02-21 MED ORDER — WARFARIN - PHARMACIST DOSING INPATIENT: Freq: Every day | Status: DC

## 2015-02-21 MED ORDER — ALBUTEROL SULFATE (2.5 MG/3ML) 0.083% IN NEBU
2.5000 mg | INHALATION_SOLUTION | Freq: Four times a day (QID) | RESPIRATORY_TRACT | Status: DC | PRN
  Administered 2015-02-24 – 2015-03-02 (×2): 2.5 mg via RESPIRATORY_TRACT
  Filled 2015-02-21 (×2): qty 3

## 2015-02-21 MED ORDER — ZOLPIDEM TARTRATE 5 MG PO TABS
5.0000 mg | ORAL_TABLET | Freq: Every evening | ORAL | Status: DC | PRN
  Administered 2015-02-22 – 2015-02-24 (×4): 5 mg via ORAL
  Filled 2015-02-21 (×4): qty 1

## 2015-02-21 MED ORDER — NITROGLYCERIN 0.4 MG SL SUBL: 0.4000 mg | SUBLINGUAL_TABLET | SUBLINGUAL | Status: DC | PRN

## 2015-02-21 MED ORDER — SODIUM CHLORIDE 0.9 % IV SOLN
INTRAVENOUS | Status: DC
Start: 2015-02-21 — End: 2015-02-22
  Administered 2015-02-21 – 2015-02-22 (×2): via INTRAVENOUS

## 2015-02-21 MED ORDER — DEXTROSE 5 % IV SOLN
1.0000 g | INTRAVENOUS | Status: DC
  Administered 2015-02-22 – 2015-02-24 (×3): 1 g via INTRAVENOUS
  Filled 2015-02-21 (×3): qty 10

## 2015-02-21 MED ORDER — ONDANSETRON HCL 4 MG PO TABS: 4.0000 mg | ORAL_TABLET | Freq: Four times a day (QID) | ORAL | Status: DC | PRN

## 2015-02-21 MED ORDER — DICLOFENAC SODIUM 1 % TD GEL: 2.0000 g | Freq: Every day | TRANSDERMAL | Status: DC | PRN

## 2015-02-21 MED ORDER — ONDANSETRON HCL 4 MG/2ML IJ SOLN: 4.0000 mg | Freq: Four times a day (QID) | INTRAMUSCULAR | Status: DC | PRN

## 2015-02-21 MED ORDER — ACETAMINOPHEN 650 MG RE SUPP: 650.0000 mg | Freq: Four times a day (QID) | RECTAL | Status: DC | PRN

## 2015-02-21 NOTE — Progress Notes (Addendum)
Patient arrived to [facility] accompanied by family. Alert at this time and c/o neck pain. No neck collar present. ED tech will bring collar. Patient with fentanyl patch on right upper chest and right lower quad ostomy bag. Urine obtained from source above. Unable to obtain orthostatic VS r/t neck pain at this time. No other distress noted. Will admin pain med once verified by pharmacy. Will continue to monitor.   Ave Filter, RN

## 2015-02-21 NOTE — ED Notes (Signed)
Patient transported to X-ray 

## 2015-02-21 NOTE — ED Notes (Signed)
Pt requested this RN to remove C-collar and to give water.  This RN reiterated to pt and pt's family importance of keeping collar in place until results back.  This RN provided mouth care with mouth swabs for pt comfort.

## 2015-02-21 NOTE — Progress Notes (Signed)
KPAd placed. Offered patient neck collar, patient refused. Staffs recommended for collar to be in place but patient rtill refused. Daughter at bedside ware of his refusal to wear his collar. Will continue to monitor.   Ave Filter, RN

## 2015-02-21 NOTE — ED Notes (Signed)
Pt arrives from [facility] via [place] reporting fall tonight at 0300.  Pt reports getting up to use the bathroom and getting dizzy.  Pt reports falling, hitting back of head on the dresser.  No lac noted.  Skin tear noted to R elbow, bleeding controlled.  Pt reports taking coumadin, denies LOC.  Pt c/o neck and back pain upon arrival, c-collar placed in ED.  Pt grimacing, moaning with pain, rates neck and back pain 9/10.

## 2015-02-21 NOTE — Progress Notes (Signed)
ANTICOAGULATION CONSULT NOTE - Initial Consult  Pharmacy Consult for Warfarin Indication: atrial fibrillation  Allergies  Allergen Reactions  . Codeine Nausea And Vomiting   Patient Measurements: Height: 5\' 8"  (172.7 cm) Weight: 160 lb (72.576 kg) IBW/kg (Calculated) : 68.4  Vital Signs: Temp: 98.2 F (36.8 C) (09/19 1300) Temp Source: Oral (09/19 1300) BP: 157/71 mmHg (09/19 1300) Pulse Rate: 63 (09/19 1300)  Labs:  Recent Labs  02/21/15 0430  HGB 12.6*  HCT 37.7*  PLT 214  LABPROT 30.7*  INR 3.02*  CREATININE 1.10  TROPONINI <0.03   Estimated Creatinine Clearance: 48.4 mL/min (by C-G formula based on Cr of 1.1).  Medical History: Past Medical History  Diagnosis Date  . Hyperlipidemia   . Orthostatic hypotension   . Bradycardia   . Syncope     s/p PTVP  . Myocardial infarction     1981  . CAD (coronary artery disease)     prior CABG in 2002 negative Myoview in 2009  . Hypertension   . Stroke     TIA  2007    . GERD (gastroesophageal reflux disease)   . Headache(784.0)   . Arthritis     OSTEOPOROSIS  . Aortic stenosis, mild     by 01/08/08 echo  . Chronic anticoagulation   . Depression   . AAA (abdominal aortic aneurysm)   . Heart attack   . Pacemaker     medtronic pacer  . History of cervical spinal surgery     over in [place]  [name] HOH (hard of hearing)     BILATERAL HEARING AIDS  . Prostate cancer   . Basal cell adenocarcinoma     'bottom lip" (11/06/2012)  . Bladder cancer   . Attention to urostomy     "since 2006" (11/06/2012)  . Atrial fibrillation     on amiodarone and has PTVP in place  . Exertional shortness of breath   . Chronic radicular pain of lower back   . Anxiety   . COPD (chronic obstructive pulmonary disease)    Medications:  Prescriptions prior to admission  Medication Sig Dispense Refill Last Dose  . albuterol (PROVENTIL HFA;VENTOLIN HFA) 108 (90 BASE) MCG/ACT inhaler Inhale 2 puffs into the lungs every 6 (six) hours as  needed for wheezing or shortness of breath. 1 Inhaler 5 02/20/2015 at Unknown time  . budesonide-formoterol (SYMBICORT) 160-4.5 MCG/ACT inhaler Inhale 2 puffs into the lungs 2 (two) times daily. 1 Inhaler 5 02/20/2015 at Unknown time  . escitalopram (LEXAPRO) 20 MG tablet Take 20 mg by mouth daily.   02/20/2015 at Unknown time  . fentaNYL (DURAGESIC - DOSED MCG/HR) 25 MCG/HR patch Place 1 patch (25 mcg total) onto the skin every 3 (three) days. 5 patch 0 02/19/2015 at Unknown time  . furosemide (LASIX) 20 MG tablet Take 1 tablet (20 mg total) by mouth daily as needed (increased shortness of breath). 30 tablet 3 Past Month at Unknown time  . ipratropium-albuterol (DUONEB) 0.5-2.5 (3) MG/3ML SOLN Take 3 mLs by nebulization every 4 (four) hours as needed. 360 mL 5 02/20/2015 at Unknown time  . metoprolol succinate (TOPROL-XL) 25 MG 24 hr tablet Take 0.5 tablets (12.5 mg total) by mouth daily. (Patient taking differently: Take 25 mg by mouth daily. ) 45 tablet 3 02/20/2015 at 0930  . PRESCRIPTION MEDICATION Wears 2 L of O2 continous   02/20/2015 at Unknown time  . simvastatin (ZOCOR) 10 MG tablet Take 10 mg by mouth every evening.  02/20/2015 at Unknown time  . VOLTAREN 1 % GEL Apply 2 g topically daily as needed (pain).    Past Month at Unknown time  . warfarin (COUMADIN) 5 MG tablet Take 5-7.5 mg by mouth every evening. Take 7.5mg  on Monday, Wednesday and Friday. Take 5mg  all other days of the week.   02/20/2015 at Unknown time  . zolpidem (AMBIEN) 5 MG tablet Take 1 tablet (5 mg total) by mouth at bedtime as needed for sleep. 30 tablet 4 02/20/2015 at Unknown time  . alendronate (FOSAMAX) 70 MG tablet Take 1 tablet by mouth once a week.     . ALPRAZolam (XANAX) 0.25 MG tablet Take 1 tablet (0.25 mg total) by mouth daily as needed for anxiety. 10 tablet 0 02/19/2015  . HYDROcodone-acetaminophen (NORCO/VICODIN) 5-325 MG per tablet Take 1 tablet by mouth every 6 (six) hours as needed for moderate pain or severe  pain.   02/19/2015  . nitroGLYCERIN (NITROSTAT) 0.4 MG SL tablet Place 0.4 mg under the tongue every 5 (five) minutes as needed for chest pain (MAX 3 TABLETS).   rescue  . NON FORMULARY Alendronate sodium 70 mg 1 per week   Taking  . tiotropium (SPIRIVA) 18 MCG inhalation capsule Place 1 capsule (18 mcg total) into inhaler and inhale daily as needed (for shortness of breath). 90 capsule 3 02/19/2015    Assessment: 81 yo male who presents after a fall with complaints of back pain.  He has multiple medical problems including A.fib for which he is on chronic Warfarin therapy.  No noted bleeding complications today and H/H has been stable for the past 6 months.  His platelets are 214K.  Today his INR is 3.02 which is the upper end of desired goal range.  His last dose was on 9/18.  CT of the head reveals nothing acute.  HOME Dose:  Warfarin 7.5mg  MWF and 5mg  TTHSS  Goal of Therapy:  INR 2-3 Monitor platelets by anticoagulation protocol: Yes   Plan:  Warfarin 2.5mg  x1 today, will put him back on his home regimen tomorrow. PT/INR daily for now CBC q72 hr  Rober Minion, PharmD., MS Clinical Pharmacist Pager:  [phone] Thank you for allowing pharmacy to be part of this patients care team. 02/21/2015,1:16 PM

## 2015-02-21 NOTE — ED Notes (Signed)
Meal Tray ordered by Secretary.

## 2015-02-21 NOTE — ED Notes (Signed)
Went to attempt Orthostatic vitals patient stated that he would not be able to sit nor stand due to the pain in his back...he felt most comfortable just laying flat.Marland KitchenMarland Kitchen

## 2015-02-21 NOTE — ED Provider Notes (Addendum)
CSN: [id number]     Arrival date & time 02/21/15  0355 History   First MD Initiated Contact with Patient 02/21/15 0409     Chief Complaint  Patient presents with  . Fall  . Back Pain     (Consider location/radiation/quality/duration/timing/severity/associated sxs/prior Treatment) HPI  This is an 81 year old male with a history of coronary artery disease, syncope, orthostatic hypotension, AAA who presents following a fall. Patient reports that he had been having diarrhea. He got up to go the bathroom, felt dizzy and fell. Unknown whether he lost consciousness. Reports hitting his head. Also reports mid back pain. Has a history of chronic back pain with sentinel patch. Is currently on Coumadin for atrial fibrillation. Current pain is 8 out of 10. Denies any abdominal pain, chest pain, or shortness of breath.  Past Medical History  Diagnosis Date  . Hyperlipidemia   . Orthostatic hypotension   . Bradycardia   . Syncope     s/p PTVP  . Myocardial infarction     1981  . CAD (coronary artery disease)     prior CABG in 2002 negative Myoview in 2009  . Hypertension   . Stroke     TIA  2007    . GERD (gastroesophageal reflux disease)   . Headache(784.0)   . Arthritis     OSTEOPOROSIS  . Aortic stenosis, mild     by 01/08/08 echo  . Chronic anticoagulation   . Depression   . AAA (abdominal aortic aneurysm)   . Heart attack   . Pacemaker     medtronic pacer  . History of cervical spinal surgery     [place]  [name] HOH (hard of hearing)     BILATERAL HEARING AIDS  . Prostate cancer   . Basal cell adenocarcinoma     'bottom lip" (11/06/2012)  . Bladder cancer   . Attention to urostomy     "since 2006" (11/06/2012)  . Atrial fibrillation     on amiodarone and has PTVP in place  . Exertional shortness of breath   . Chronic radicular pain of lower back   . Anxiety   . COPD (chronic obstructive pulmonary disease)    Past Surgical History  Procedure Laterality Date  . Insert /  replace / remove pacemaker      11/2009  DR BRODIE    . Prostatectomy      BLADDER ALSO REMOVED (OSTOMY BAG)  2006  FOR CANCER     . Abdominal aortic aneurysm repair  1999    STENT PLACED    . Cataract extraction w/ intraocular lens  implant, bilateral    . Appendectomy      1959  . Revision urostomy cutaneous    . Revision urostomy cutaneous  2006    Urostomy bag placed on right lower abdomen  . Vertebroplasty  08/22/2011    Procedure: VERTEBROPLASTY;  Surgeon: Winfield Cunas, MD;  Location: [facility];  Service: Neurosurgery;  Laterality: N/A;  Lumbar Two vertebroplasty  . Cholecystectomy      4 YRS AGO  . Posterior lumbar fusion  11/06/2012  . Coronary artery bypass graft  1999    CABG X2  . Skin cancer excision      "bottom lip" (11/06/2012)  . Bladder surgery      "for cancer" (11/06/2012)  . Lumbar laminectomy/decompression microdiscectomy Bilateral 11/06/2012    Procedure: LUMBAR LAMINECTOMY/DECOMPRESSION MICRODISCECTOMY;  Surgeon: Sinclair Ship, MD;  Location: [facility];  Service:  Orthopedics;  Laterality: Bilateral;  Lumbar 2-5 decompression   Family History  Problem Relation Age of Onset  . Cancer Father     stomach  . Cancer Sister     lung  . Heart disease Sister   . Hypertension Sister   . Heart disease Mother   . Hypertension Mother   . Heart attack Neg Hx   . Stroke Neg Hx    Social History  Substance Use Topics  . Smoking status: Former Smoker -- 3.00 packs/day for 50 years    Types: Cigarettes    Quit date: 06/04/1978  . Smokeless tobacco: Current User    Types: Snuff  . Alcohol Use: No     Comment: rare "once a month"    Review of Systems  Constitutional: Negative for fever.  Respiratory: Negative for chest tightness and shortness of breath.   Cardiovascular: Negative for chest pain.  Gastrointestinal: Negative for abdominal pain.  Musculoskeletal: Positive for back pain and neck pain.  Neurological: Positive for dizziness and headaches.   Psychiatric/Behavioral: Negative for confusion.  All other systems reviewed and are negative.     Allergies  Codeine  Home Medications   Prior to Admission medications   Medication Sig Start Date End Date Taking? Authorizing Provider  albuterol (PROVENTIL HFA;VENTOLIN HFA) 108 (90 BASE) MCG/ACT inhaler Inhale 2 puffs into the lungs every 6 (six) hours as needed for wheezing or shortness of breath. 11/16/14   Collene Gobble, MD  alendronate (FOSAMAX) 70 MG tablet Take 1 tablet by mouth once a week. 12/24/14   Historical Provider, MD  ALPRAZolam Duanne Moron) 0.25 MG tablet Take 1 tablet (0.25 mg total) by mouth daily as needed for anxiety. 01/12/15   Brand Males, MD  budesonide-formoterol (SYMBICORT) 160-4.5 MCG/ACT inhaler Inhale 2 puffs into the lungs 2 (two) times daily. 12/27/14   Tammy S Parrett, NP  escitalopram (LEXAPRO) 20 MG tablet Take 20 mg by mouth daily.    Historical Provider, MD  fentaNYL (DURAGESIC - DOSED MCG/HR) 25 MCG/HR patch Place 1 patch (25 mcg total) onto the skin every 3 (three) days. 10/06/13   Belkys A Regalado, MD  furosemide (LASIX) 20 MG tablet Take 1 tablet (20 mg total) by mouth daily as needed (increased shortness of breath). Patient not taking: Reported on 01/12/2015 12/17/14   Darlin Coco, MD  HYDROcodone-acetaminophen (NORCO/VICODIN) 5-325 MG per tablet Take 1 tablet by mouth every 6 (six) hours as needed for moderate pain or severe pain.    Historical Provider, MD  ipratropium-albuterol (DUONEB) 0.5-2.5 (3) MG/3ML SOLN Take 3 mLs by nebulization every 4 (four) hours as needed. 08/24/14   Theodis Blaze, MD  metoprolol succinate (TOPROL-XL) 25 MG 24 hr tablet Take 0.5 tablets (12.5 mg total) by mouth daily. 08/03/14   Darlin Coco, MD  nitroGLYCERIN (NITROSTAT) 0.4 MG SL tablet Place 0.4 mg under the tongue every 5 (five) minutes as needed for chest pain (MAX 3 TABLETS).    Historical Provider, MD  NON FORMULARY Alendronate sodium 70 mg 1 per week     Historical Provider, MD  predniSONE (DELTASONE) 10 MG tablet Take 40mg  for 3 days, then 30mg  for 3 days, 20mg  for 3 days, 10mg  for 3 days, then stop 01/18/15   Brand Males, MD  predniSONE (DELTASONE) 10 MG tablet 40mg  once daily x 3 days, then 30mg  once daily x 3 days, 20mg  once daily x 3 days, 10mg  once daily  x 3 days and 5mg  daily x 3 days and stop  01/13/15   Brand Males, MD  PRESCRIPTION MEDICATION Wears 2 L of O2 at night    Historical Provider, MD  simvastatin (ZOCOR) 10 MG tablet Take 10 mg by mouth every evening.    Historical Provider, MD  tiotropium (SPIRIVA) 18 MCG inhalation capsule Place 1 capsule (18 mcg total) into inhaler and inhale daily as needed (for shortness of breath). 02/03/14   Collene Gobble, MD  VOLTAREN 1 % GEL Apply 2 g topically daily as needed (pain).  12/10/13   Historical Provider, MD  warfarin (COUMADIN) 5 MG tablet Take 5-7.5 mg by mouth every evening. Take 7.5mg  on Monday, Wednesday and Friday. Take 5mg  all other days of the week. 10/06/13   Belkys A Regalado, MD  zolpidem (AMBIEN) 5 MG tablet Take 1 tablet (5 mg total) by mouth at bedtime as needed for sleep. 01/06/15   Darlin Coco, MD   BP 152/69 mmHg  Pulse 64  Resp 17  Ht 5\' 8"  (1.727 m)  Wt 160 lb (72.576 kg)  BMI 24.33 kg/m2  SpO2 98% Physical Exam  Constitutional: He is oriented to person, place, and time. No distress.  Elderly, uncomfortable appearing  HENT:  Head: Normocephalic and atraumatic.  Mouth/Throat: Oropharynx is clear and moist.  Eyes: Pupils are equal, round, and reactive to light.  Neck:  C-collar in place  Cardiovascular: Normal rate, regular rhythm and normal heart sounds.   No murmur heard. Pulmonary/Chest: Effort normal and breath sounds normal. No respiratory distress. He has no wheezes.  Abdominal: Soft. Bowel sounds are normal. There is no tenderness. There is no rebound and no guarding.  Urostomy bag noted in right lower quadrant  Musculoskeletal: He exhibits no  edema.  Normal range of motion of bilateral hips and knees, tenderness to palpation over the mid thoracic and lumbar spine without step off or deformity noted  Neurological: He is alert and oriented to person, place, and time.  5 out of 5 strength bilateral lower extremities, normal reflexes  Skin: Skin is warm and dry.  Psychiatric: He has a normal mood and affect.  Nursing note and vitals reviewed.   ED Course  Procedures (including critical care time) Labs Review Labs Reviewed  CBC WITH DIFFERENTIAL/PLATELET - Abnormal; Notable for the following:    WBC 15.7 (*)    Hemoglobin 12.6 (*)    HCT 37.7 (*)    RDW 15.7 (*)    Neutro Abs 11.9 (*)    Monocytes Absolute 1.6 (*)    All other components within normal limits  BASIC METABOLIC PANEL - Abnormal; Notable for the following:    Glucose, Bld 100 (*)    BUN 22 (*)    GFR calc non Af Amer 60 (*)    All other components within normal limits  PROTIME-INR - Abnormal; Notable for the following:    Prothrombin Time 30.7 (*)    INR 3.02 (*)    All other components within normal limits  TROPONIN I  URINALYSIS, ROUTINE W REFLEX MICROSCOPIC (NOT AT [facility])    Imaging Review Dg Thoracic Spine W/swimmers  02/21/2015   CLINICAL DATA:  [place] from bed last night. Intra scapular and low back pain. History of lumbar laminectomies.  EXAM: LUMBAR SPINE - COMPLETE 4+ VIEW; THORACIC SPINE - 3 VIEWS  COMPARISON:  CT abdomen and pelvis July 05, 2014 and CT chest January 07, 2015  FINDINGS: Mild T7 compression fracture is new from prior chest CT. The remaining thoracic vertebra bodies appear intact and aligned  with maintenance of thoracic kyphosis osteopenia. Moderate lower thoracic disc height loss, endplate sclerosis and marginal spurring consistent with degenerative discs. Partially imaged ACDF. Broad thoracolumbar dextroscoliosis. LEFT cardiac pacemaker in situ. Extensive fibrotic changes of the lungs consistent with history of interstitial lung  disease. Status post median sternotomy.  Lumbar vertebral bodies are intact and aligned, maintenance of lumbar lordosis. Mild chronic L2 compression fracture, status post cement augmentation. Mild chronic L3 compression deformity. Status post L4-5 PLIF, hardware appears intact, no periprosthetic lucency. Maintenance of lumbar lordosis. Broad levoscoliosis. No definite pars interarticularis defects though, partially obscured by hardware. Osteopenia. Surgical clips project in LEFT abdomen. Mild aortoiliac vascular calcifications.  IMPRESSION: Mild T7 compression fracture is likely acute, new from January 07, 2015. No thoracic malalignment.  No acute lumbar spine fracture deformity or malalignment.  Chronic L2 and L3 mild compression fractures, status post L2 cement augmentation. L4-5 PLIF.   Electronically Signed   By: Elon Alas M.D.   On: 02/21/2015 05:06   Dg Lumbar Spine Complete  02/21/2015   CLINICAL DATA:  [place] from bed last night. Intra scapular and low back pain. History of lumbar laminectomies.  EXAM: LUMBAR SPINE - COMPLETE 4+ VIEW; THORACIC SPINE - 3 VIEWS  COMPARISON:  CT abdomen and pelvis July 05, 2014 and CT chest January 07, 2015  FINDINGS: Mild T7 compression fracture is new from prior chest CT. The remaining thoracic vertebra bodies appear intact and aligned with maintenance of thoracic kyphosis osteopenia. Moderate lower thoracic disc height loss, endplate sclerosis and marginal spurring consistent with degenerative discs. Partially imaged ACDF. Broad thoracolumbar dextroscoliosis. LEFT cardiac pacemaker in situ. Extensive fibrotic changes of the lungs consistent with history of interstitial lung disease. Status post median sternotomy.  Lumbar vertebral bodies are intact and aligned, maintenance of lumbar lordosis. Mild chronic L2 compression fracture, status post cement augmentation. Mild chronic L3 compression deformity. Status post L4-5 PLIF, hardware appears intact, no periprosthetic  lucency. Maintenance of lumbar lordosis. Broad levoscoliosis. No definite pars interarticularis defects though, partially obscured by hardware. Osteopenia. Surgical clips project in LEFT abdomen. Mild aortoiliac vascular calcifications.  IMPRESSION: Mild T7 compression fracture is likely acute, new from January 07, 2015. No thoracic malalignment.  No acute lumbar spine fracture deformity or malalignment.  Chronic L2 and L3 mild compression fractures, status post L2 cement augmentation. L4-5 PLIF.   Electronically Signed   By: Elon Alas M.D.   On: 02/21/2015 05:06   Ct Head Wo Contrast  02/21/2015   CLINICAL DATA:  Dizzy on way to bathroom, fell and hit back of head on dresser. Headache headaches and extreme lower cervical neck pain.  EXAM: CT HEAD WITHOUT CONTRAST  CT CERVICAL SPINE WITHOUT CONTRAST  TECHNIQUE: Multidetector CT imaging of the head and cervical spine was performed following the standard protocol without intravenous contrast. Multiplanar CT image reconstructions of the cervical spine were also generated.  COMPARISON:  CT head February 25, 2013  FINDINGS: CT HEAD FINDINGS  The ventricles and sulci are normal for age. No intraparenchymal hemorrhage, mass effect nor midline shift. Patchy supratentorial white matter hypodensities are within normal range for patient's age and though non-specific suggest sequelae of chronic small vessel ischemic disease. No acute large vascular territory infarcts.  No abnormal extra-axial fluid collections. Basal cisterns are patent. Moderate calcific atherosclerosis of the carotid siphons.  No skull fracture. The included ocular globes and orbital contents are non-suspicious. Status post bilateral ocular lens implants. Trace paranasal sinus mucosal thickening without air-fluid levels. The mastoid  air cells are well aerated. Patient is edentulous. Small amount of LEFT suboccipital scalp soft tissue stranding.  CT CERVICAL SPINE FINDINGS  Cervical vertebral bodies  and posterior elements are intact. Grade 1 C7-T1 anterolisthesis without spondylolysis. C4-5 and C5-6 ACDF with solid interbody fusion, anterior plate and screw fixation appears intact, well seated without periprosthetic lucency. Severe C6-7 moderate to severe C7-T1 disc height loss, uncovertebral hypertrophy and endplate spurring compatible with degenerative disc, mild at C3-4. C1-2 articulation maintained with moderate arthropathy. Small amount of calcified pannus about the odontoid process can be seen with CPPD. No destructive bony lesions. Severe RIGHT C2-3 neural foraminal narrowing. Moderate to severe C7-T1 neural foraminal narrowing.  Scarring in the upper lobes of the lungs, partially imaged, with bullous changes. No prevertebral soft tissue swelling. Pacemaker wires noted. Moderate calcific atherosclerosis of the RIGHT carotid bulb.  IMPRESSION: CT HEAD: No acute intracranial process.  Stable chronic changes of the brain: Involutional changes and mild to moderate white matter small vessel ischemic disease.  CT CERVICAL SPINE: No acute cervical spine fracture. Grade 1 C7-T1 anterolisthesis on degenerative basis.  C4-5 and C5-6 solid fusion.   Electronically Signed   By: Elon Alas M.D.   On: 02/21/2015 05:50   Ct Cervical Spine Wo Contrast  02/21/2015   CLINICAL DATA:  Dizzy on way to bathroom, fell and hit back of head on dresser. Headache headaches and extreme lower cervical neck pain.  EXAM: CT HEAD WITHOUT CONTRAST  CT CERVICAL SPINE WITHOUT CONTRAST  TECHNIQUE: Multidetector CT imaging of the head and cervical spine was performed following the standard protocol without intravenous contrast. Multiplanar CT image reconstructions of the cervical spine were also generated.  COMPARISON:  CT head February 25, 2013  FINDINGS: CT HEAD FINDINGS  The ventricles and sulci are normal for age. No intraparenchymal hemorrhage, mass effect nor midline shift. Patchy supratentorial white matter hypodensities  are within normal range for patient's age and though non-specific suggest sequelae of chronic small vessel ischemic disease. No acute large vascular territory infarcts.  No abnormal extra-axial fluid collections. Basal cisterns are patent. Moderate calcific atherosclerosis of the carotid siphons.  No skull fracture. The included ocular globes and orbital contents are non-suspicious. Status post bilateral ocular lens implants. Trace paranasal sinus mucosal thickening without air-fluid levels. The mastoid air cells are well aerated. Patient is edentulous. Small amount of LEFT suboccipital scalp soft tissue stranding.  CT CERVICAL SPINE FINDINGS  Cervical vertebral bodies and posterior elements are intact. Grade 1 C7-T1 anterolisthesis without spondylolysis. C4-5 and C5-6 ACDF with solid interbody fusion, anterior plate and screw fixation appears intact, well seated without periprosthetic lucency. Severe C6-7 moderate to severe C7-T1 disc height loss, uncovertebral hypertrophy and endplate spurring compatible with degenerative disc, mild at C3-4. C1-2 articulation maintained with moderate arthropathy. Small amount of calcified pannus about the odontoid process can be seen with CPPD. No destructive bony lesions. Severe RIGHT C2-3 neural foraminal narrowing. Moderate to severe C7-T1 neural foraminal narrowing.  Scarring in the upper lobes of the lungs, partially imaged, with bullous changes. No prevertebral soft tissue swelling. Pacemaker wires noted. Moderate calcific atherosclerosis of the RIGHT carotid bulb.  IMPRESSION: CT HEAD: No acute intracranial process.  Stable chronic changes of the brain: Involutional changes and mild to moderate white matter small vessel ischemic disease.  CT CERVICAL SPINE: No acute cervical spine fracture. Grade 1 C7-T1 anterolisthesis on degenerative basis.  C4-5 and C5-6 solid fusion.   Electronically Signed   By: Thana Farr.D.  On: 02/21/2015 05:50   I have personally  reviewed and evaluated these images and lab results as part of my medical decision-making.   EKG Interpretation   Date/Time:  Monday February 21 2015 04:08:35 EDT Ventricular Rate:  70 PR Interval:  189 QRS Duration: 108 QT Interval:  421 QTC Calculation: 030 R Axis:   19 Text Interpretation:  Sinus rhythm No significant change since last  tracing Confirmed by HORTON  MD, COURTNEY ([id number]) on 02/21/2015 4:54:51 AM      MDM   Final diagnoses:  Traumatic compression fracture of T7 thoracic vertebra, closed, initial encounter    Patient presents following a fall. ABCs intact. Vital signs reassuring. Pain mostly over the thoracic and lumbar spine. Nontoxic on exam. She has a history of orthostatic hypotension and reports that he "occasionally gets dizzy." Patient was given pain and nausea medication. CT scan of the head and neck obtained given patient's anticoagulate use. INR is therapeutic at 3. CT scans negative for intracranial bleed. Patient does have evidence of a new T7 compression fracture. He reports persistent pain there. He is neurologically intact.  Discussed with Dr. Novella Olive on call for Dr. Jonni Sanger who did the patient's prior spinal surgeries. No other interventions at this time. Pain control. Patient was able to ambulate but did have significant pain. He wishes to be discharged if at all possible. He is at her living facility where there is a possibility of escalating care. He lives at [name]. Will hold patient for social worker evaluation. In the meantime patient given fluids and more aggressive pain control.  Signed out to oncoming doctor.  Merryl Hacker, MD 02/21/15 0076  Merryl Hacker, MD 02/21/15 [phone]

## 2015-02-21 NOTE — H&P (Signed)
[facility] History and Physical                                                                                    Peter Richards, is a 81 y.o. male  MRN: [id number]   DOB - [birth year]  Admit Date - 02/21/2015  Outpatient Primary MD for the patient is Peter Argyle, MD  Referring Physician:  Dr. Ardis Hughs, EDP  Chief Complaint:   Chief Complaint  Patient presents with  . Fall  . Back Pain     HPI  Peter Richards  is a 81 y.o. male, with A. fib on Coumadin, pulmonary fibrosis, orthostasis, pacemaker in place, history of CVA, and cervical fusion/ACDF who fell at 2 AM this morning and presented to the emergency department with acute on chronic back pain. Peter Richards reports that he awoke abruptly this morning needing have diarrhea bowel movement. He attempted to go to the bathroom but fell as he rounded the corner of his bed. He remembers hitting the floor and hitting his head. His back pain is located centrally, it's described as stabbing, it is constant, movement makes it worse, there are no relieving factors. He reports that for the past week his appetite has been decreased. For the last 2 days he has been having watery diarrhea bowel movements, approximately 2 per day. He also describes a lower abdominal pain.  He denies chest pain, palpitations, lower extremity swelling, fever  and cough.  He does report a decreased exercise tolerance over the past several months. He used to be able to walk down the hallway without becoming winded now he can barely make it across the room. He is on 2 L of oxygen at home.  In the emergency department his white count is elevated at 15.7, INR is within therapeutic range. X-ray of the lumbar spine shows mild T7 compression fracture, UA is positive for infection. CT head shows no acute changes or bleeding.  Review of Systems   In addition to the HPI above,  No Fever-chills, No Headache, No changes with Vision or hearing, No problems swallowing  food or Liquids, No Chest pain, Cough or Shortness of Breath, No Blood in stool or Urine, No dysuria, No new skin rashes or bruises, No new weakness, tingling, numbness in any extremity, ++ 6-8 pound unintentional weight loss in the past 6 months A full 10 point Review of Systems was done, except as stated above, all other Review of Systems were negative.  Past Medical History  Past Medical History  Diagnosis Date  . Hyperlipidemia   . Orthostatic hypotension   . Bradycardia   . Syncope     s/p PTVP  . Myocardial infarction     1981  . CAD (coronary artery disease)     prior CABG in 2002 negative Myoview in 2009  . Hypertension   . Stroke     TIA  2007    . GERD (gastroesophageal reflux disease)   . Headache(784.0)   . Arthritis     OSTEOPOROSIS  . Aortic stenosis, mild     by 01/08/08 echo  . Chronic anticoagulation   . Depression   . AAA (abdominal  aortic aneurysm)   . Heart attack   . Pacemaker     medtronic pacer  . History of cervical spinal surgery     [place]  [name] HOH (hard of hearing)     BILATERAL HEARING AIDS  . Prostate cancer   . Basal cell adenocarcinoma     'bottom lip" (11/06/2012)  . Bladder cancer   . Attention to urostomy     "since 2006" (11/06/2012)  . Atrial fibrillation     on amiodarone and has PTVP in place  . Exertional shortness of breath   . Chronic radicular pain of lower back   . Anxiety   . COPD (chronic obstructive pulmonary disease)     Past Surgical History  Procedure Laterality Date  . Insert / replace / remove pacemaker      11/2009  DR BRODIE    . Prostatectomy      BLADDER ALSO REMOVED (OSTOMY BAG)  2006  FOR CANCER     . Abdominal aortic aneurysm repair  1999    STENT PLACED    . Cataract extraction w/ intraocular lens  implant, bilateral    . Appendectomy      1959  . Revision urostomy cutaneous    . Revision urostomy cutaneous  2006    Urostomy bag placed on right lower abdomen  . Vertebroplasty  08/22/2011     Procedure: VERTEBROPLASTY;  Surgeon: Winfield Cunas, MD;  Location: [facility];  Service: Neurosurgery;  Laterality: N/A;  Lumbar Two vertebroplasty  . Cholecystectomy      4 YRS AGO  . Posterior lumbar fusion  11/06/2012  . Coronary artery bypass graft  1999    CABG X2  . Skin cancer excision      "bottom lip" (11/06/2012)  . Bladder surgery      "for cancer" (11/06/2012)  . Lumbar laminectomy/decompression microdiscectomy Bilateral 11/06/2012    Procedure: LUMBAR LAMINECTOMY/DECOMPRESSION MICRODISCECTOMY;  Surgeon: Sinclair Ship, MD;  Location: [facility];  Service: Orthopedics;  Laterality: Bilateral;  Lumbar 2-5 decompression      Social History Social History  Substance Use Topics  . Smoking status: Former Smoker -- 3.00 packs/day for 50 years    Types: Cigarettes    Quit date: 06/04/1978  . Smokeless tobacco: Current User    Types: Snuff  . Alcohol Use: No     Comment: rare "once a month"   married, was able to ambulate independently, independent with ADLs. Lives at [facility] independent living    Family History Family History  Problem Relation Age of Onset  . Cancer Father     stomach  . Cancer Sister     lung  . Heart disease Sister   . Hypertension Sister   . Heart disease Mother   . Hypertension Mother   . Heart attack Neg Hx   . Stroke Neg Hx     Prior to Admission medications   Medication Sig Start Date End Date Taking? Authorizing Provider  albuterol (PROVENTIL HFA;VENTOLIN HFA) 108 (90 BASE) MCG/ACT inhaler Inhale 2 puffs into the lungs every 6 (six) hours as needed for wheezing or shortness of breath. 11/16/14  Yes Collene Gobble, MD  budesonide-formoterol (SYMBICORT) 160-4.5 MCG/ACT inhaler Inhale 2 puffs into the lungs 2 (two) times daily. 12/27/14  Yes Tammy S Parrett, NP  escitalopram (LEXAPRO) 20 MG tablet Take 20 mg by mouth daily.   Yes Historical Provider, MD  fentaNYL (DURAGESIC - DOSED MCG/HR) 25 MCG/HR  patch Place 1 patch (25 mcg total) onto  the skin every 3 (three) days. 10/06/13  Yes Belkys A Regalado, MD  furosemide (LASIX) 20 MG tablet Take 1 tablet (20 mg total) by mouth daily as needed (increased shortness of breath). 12/17/14  Yes Darlin Coco, MD  ipratropium-albuterol (DUONEB) 0.5-2.5 (3) MG/3ML SOLN Take 3 mLs by nebulization every 4 (four) hours as needed. 08/24/14  Yes Theodis Blaze, MD  metoprolol succinate (TOPROL-XL) 25 MG 24 hr tablet Take 0.5 tablets (12.5 mg total) by mouth daily. Patient taking differently: Take 25 mg by mouth daily.  08/03/14  Yes Darlin Coco, MD  PRESCRIPTION MEDICATION Wears 2 L of O2 continous   Yes Historical Provider, MD  simvastatin (ZOCOR) 10 MG tablet Take 10 mg by mouth every evening.   Yes Historical Provider, MD  VOLTAREN 1 % GEL Apply 2 g topically daily as needed (pain).  12/10/13  Yes Historical Provider, MD  warfarin (COUMADIN) 5 MG tablet Take 5-7.5 mg by mouth every evening. Take 7.5mg  on Monday, Wednesday and Friday. Take 5mg  all other days of the week. 10/06/13  Yes Belkys A Regalado, MD  zolpidem (AMBIEN) 5 MG tablet Take 1 tablet (5 mg total) by mouth at bedtime as needed for sleep. 01/06/15  Yes Darlin Coco, MD  alendronate (FOSAMAX) 70 MG tablet Take 1 tablet by mouth once a week. 12/24/14   Historical Provider, MD  ALPRAZolam Duanne Moron) 0.25 MG tablet Take 1 tablet (0.25 mg total) by mouth daily as needed for anxiety. 01/12/15   Brand Males, MD  HYDROcodone-acetaminophen (NORCO/VICODIN) 5-325 MG per tablet Take 1 tablet by mouth every 6 (six) hours as needed for moderate pain or severe pain.    Historical Provider, MD  nitroGLYCERIN (NITROSTAT) 0.4 MG SL tablet Place 0.4 mg under the tongue every 5 (five) minutes as needed for chest pain (MAX 3 TABLETS).    Historical Provider, MD  NON FORMULARY Alendronate sodium 70 mg 1 per week    Historical Provider, MD  tiotropium (SPIRIVA) 18 MCG inhalation capsule Place 1 capsule (18 mcg total) into inhaler and inhale daily as needed  (for shortness of breath). 02/03/14   Collene Gobble, MD    Allergies  Allergen Reactions  . Codeine Nausea And Vomiting    Physical Exam  Vitals  Blood pressure 128/67, pulse 62, resp. rate 15, height 5\' 8"  (1.727 m), weight 72.576 kg (160 lb), SpO2 98 %.   General: Thin, elderly male lying in bed in NAD, wife and daughters at bedside  Psych:  Normal affect and insight, Not Suicidal or Homicidal, Awake Alert, Oriented X 3.  Neuro:   No F.N deficits, asymmetry of his mouth (chronic) ALL C.Nerves Intact, Strength 5/5 all 4 extremities, Sensation intact all 4 extremities.  ENT:  Ears and Eyes appear Normal, Conjunctivae clear, PER. Oral mucous membranes are dry  Neck:  Supple, No lymphadenopathy appreciated  Respiratory:  Symmetrical chest wall movement, Good air movement bilaterally, CTAB.  Cardiac:  RRR, No Murmurs, no LE edema noted, no JVD.    Abdomen:  Positive bowel sounds, Soft, Non tender, Non distended,  No masses appreciated, positive urostomy bag in place  Skin:  No Cyanosis, Normal Skin Turgor, No Skin Rash or Bruise.  Extremities:  Able to move all 4. 5/5 strength in each,  no effusions.  Data Review  CBC  Recent Labs Lab 02/21/15 0430  WBC 15.7*  HGB 12.6*  HCT 37.7*  PLT 214  MCV 89.1  MCH 29.8  MCHC 33.4  RDW 15.7*  LYMPHSABS 1.9  MONOABS 1.6*  EOSABS 0.3  BASOSABS 0.0    Chemistries   Recent Labs Lab 02/21/15 0430  NA 137  K 3.9  CL 102  CO2 26  GLUCOSE 100*  BUN 22*  CREATININE 1.10  CALCIUM 9.1     Coagulation profile  Recent Labs Lab 02/16/15 02/21/15 0430  INR 3.0 3.02*     Cardiac Enzymes  Recent Labs Lab 02/21/15 0430  TROPONINI <0.03     Urinalysis    Component Value Date/Time   COLORURINE YELLOW 02/21/2015 0748   APPEARANCEUR TURBID* 02/21/2015 0748   LABSPEC 1.019 02/21/2015 0748   PHURINE 8.5* 02/21/2015 0748   GLUCOSEU NEGATIVE 02/21/2015 0748   HGBUR MODERATE* 02/21/2015 0748   BILIRUBINUR  NEGATIVE 02/21/2015 0748   KETONESUR NEGATIVE 02/21/2015 0748   PROTEINUR 100* 02/21/2015 0748   UROBILINOGEN 0.2 02/21/2015 0748   NITRITE POSITIVE* 02/21/2015 0748   LEUKOCYTESUR SMALL* 02/21/2015 0748    Imaging results:   Dg Thoracic Spine W/swimmers  02/21/2015   CLINICAL DATA:  [place] from bed last night. Intra scapular and low back pain. History of lumbar laminectomies.  EXAM: LUMBAR SPINE - COMPLETE 4+ VIEW; THORACIC SPINE - 3 VIEWS  COMPARISON:  CT abdomen and pelvis July 05, 2014 and CT chest January 07, 2015  FINDINGS: Mild T7 compression fracture is new from prior chest CT. The remaining thoracic vertebra bodies appear intact and aligned with maintenance of thoracic kyphosis osteopenia. Moderate lower thoracic disc height loss, endplate sclerosis and marginal spurring consistent with degenerative discs. Partially imaged ACDF. Broad thoracolumbar dextroscoliosis. LEFT cardiac pacemaker in situ. Extensive fibrotic changes of the lungs consistent with history of interstitial lung disease. Status post median sternotomy.  Lumbar vertebral bodies are intact and aligned, maintenance of lumbar lordosis. Mild chronic L2 compression fracture, status post cement augmentation. Mild chronic L3 compression deformity. Status post L4-5 PLIF, hardware appears intact, no periprosthetic lucency. Maintenance of lumbar lordosis. Broad levoscoliosis. No definite pars interarticularis defects though, partially obscured by hardware. Osteopenia. Surgical clips project in LEFT abdomen. Mild aortoiliac vascular calcifications.  IMPRESSION: Mild T7 compression fracture is likely acute, new from January 07, 2015. No thoracic malalignment.  No acute lumbar spine fracture deformity or malalignment.  Chronic L2 and L3 mild compression fractures, status post L2 cement augmentation. L4-5 PLIF.   Electronically Signed   By: Elon Alas M.D.   On: 02/21/2015 05:06   Dg Lumbar Spine Complete  02/21/2015   CLINICAL DATA:   [place] from bed last night. Intra scapular and low back pain. History of lumbar laminectomies.  EXAM: LUMBAR SPINE - COMPLETE 4+ VIEW; THORACIC SPINE - 3 VIEWS  COMPARISON:  CT abdomen and pelvis July 05, 2014 and CT chest January 07, 2015  FINDINGS: Mild T7 compression fracture is new from prior chest CT. The remaining thoracic vertebra bodies appear intact and aligned with maintenance of thoracic kyphosis osteopenia. Moderate lower thoracic disc height loss, endplate sclerosis and marginal spurring consistent with degenerative discs. Partially imaged ACDF. Broad thoracolumbar dextroscoliosis. LEFT cardiac pacemaker in situ. Extensive fibrotic changes of the lungs consistent with history of interstitial lung disease. Status post median sternotomy.  Lumbar vertebral bodies are intact and aligned, maintenance of lumbar lordosis. Mild chronic L2 compression fracture, status post cement augmentation. Mild chronic L3 compression deformity. Status post L4-5 PLIF, hardware appears intact, no periprosthetic lucency. Maintenance of lumbar lordosis. Broad levoscoliosis. No definite pars interarticularis defects though, partially  obscured by hardware. Osteopenia. Surgical clips project in LEFT abdomen. Mild aortoiliac vascular calcifications.  IMPRESSION: Mild T7 compression fracture is likely acute, new from January 07, 2015. No thoracic malalignment.  No acute lumbar spine fracture deformity or malalignment.  Chronic L2 and L3 mild compression fractures, status post L2 cement augmentation. L4-5 PLIF.   Electronically Signed   By: Elon Alas M.D.   On: 02/21/2015 05:06   Ct Head Wo Contrast  02/21/2015   CLINICAL DATA:  Dizzy on way to bathroom, fell and hit back of head on dresser. Headache headaches and extreme lower cervical neck pain.  EXAM: CT HEAD WITHOUT CONTRAST  CT CERVICAL SPINE WITHOUT CONTRAST  TECHNIQUE: Multidetector CT imaging of the head and cervical spine was performed following the standard protocol  without intravenous contrast. Multiplanar CT image reconstructions of the cervical spine were also generated.  COMPARISON:  CT head February 25, 2013  FINDINGS: CT HEAD FINDINGS  The ventricles and sulci are normal for age. No intraparenchymal hemorrhage, mass effect nor midline shift. Patchy supratentorial white matter hypodensities are within normal range for patient's age and though non-specific suggest sequelae of chronic small vessel ischemic disease. No acute large vascular territory infarcts.  No abnormal extra-axial fluid collections. Basal cisterns are patent. Moderate calcific atherosclerosis of the carotid siphons.  No skull fracture. The included ocular globes and orbital contents are non-suspicious. Status post bilateral ocular lens implants. Trace paranasal sinus mucosal thickening without air-fluid levels. The mastoid air cells are well aerated. Patient is edentulous. Small amount of LEFT suboccipital scalp soft tissue stranding.  CT CERVICAL SPINE FINDINGS  Cervical vertebral bodies and posterior elements are intact. Grade 1 C7-T1 anterolisthesis without spondylolysis. C4-5 and C5-6 ACDF with solid interbody fusion, anterior plate and screw fixation appears intact, well seated without periprosthetic lucency. Severe C6-7 moderate to severe C7-T1 disc height loss, uncovertebral hypertrophy and endplate spurring compatible with degenerative disc, mild at C3-4. C1-2 articulation maintained with moderate arthropathy. Small amount of calcified pannus about the odontoid process can be seen with CPPD. No destructive bony lesions. Severe RIGHT C2-3 neural foraminal narrowing. Moderate to severe C7-T1 neural foraminal narrowing.  Scarring in the upper lobes of the lungs, partially imaged, with bullous changes. No prevertebral soft tissue swelling. Pacemaker wires noted. Moderate calcific atherosclerosis of the RIGHT carotid bulb.  IMPRESSION: CT HEAD: No acute intracranial process.  Stable chronic changes of  the brain: Involutional changes and mild to moderate white matter small vessel ischemic disease.  CT CERVICAL SPINE: No acute cervical spine fracture. Grade 1 C7-T1 anterolisthesis on degenerative basis.  C4-5 and C5-6 solid fusion.   Electronically Signed   By: Elon Alas M.D.   On: 02/21/2015 05:50   Ct Cervical Spine Wo Contrast  02/21/2015   CLINICAL DATA:  Dizzy on way to bathroom, fell and hit back of head on dresser. Headache headaches and extreme lower cervical neck pain.  EXAM: CT HEAD WITHOUT CONTRAST  CT CERVICAL SPINE WITHOUT CONTRAST  TECHNIQUE: Multidetector CT imaging of the head and cervical spine was performed following the standard protocol without intravenous contrast. Multiplanar CT image reconstructions of the cervical spine were also generated.  COMPARISON:  CT head February 25, 2013  FINDINGS: CT HEAD FINDINGS  The ventricles and sulci are normal for age. No intraparenchymal hemorrhage, mass effect nor midline shift. Patchy supratentorial white matter hypodensities are within normal range for patient's age and though non-specific suggest sequelae of chronic small vessel ischemic disease. No acute large vascular  territory infarcts.  No abnormal extra-axial fluid collections. Basal cisterns are patent. Moderate calcific atherosclerosis of the carotid siphons.  No skull fracture. The included ocular globes and orbital contents are non-suspicious. Status post bilateral ocular lens implants. Trace paranasal sinus mucosal thickening without air-fluid levels. The mastoid air cells are well aerated. Patient is edentulous. Small amount of LEFT suboccipital scalp soft tissue stranding.  CT CERVICAL SPINE FINDINGS  Cervical vertebral bodies and posterior elements are intact. Grade 1 C7-T1 anterolisthesis without spondylolysis. C4-5 and C5-6 ACDF with solid interbody fusion, anterior plate and screw fixation appears intact, well seated without periprosthetic lucency. Severe C6-7 moderate to  severe C7-T1 disc height loss, uncovertebral hypertrophy and endplate spurring compatible with degenerative disc, mild at C3-4. C1-2 articulation maintained with moderate arthropathy. Small amount of calcified pannus about the odontoid process can be seen with CPPD. No destructive bony lesions. Severe RIGHT C2-3 neural foraminal narrowing. Moderate to severe C7-T1 neural foraminal narrowing.  Scarring in the upper lobes of the lungs, partially imaged, with bullous changes. No prevertebral soft tissue swelling. Pacemaker wires noted. Moderate calcific atherosclerosis of the RIGHT carotid bulb.  IMPRESSION: CT HEAD: No acute intracranial process.  Stable chronic changes of the brain: Involutional changes and mild to moderate white matter small vessel ischemic disease.  CT CERVICAL SPINE: No acute cervical spine fracture. Grade 1 C7-T1 anterolisthesis on degenerative basis.  C4-5 and C5-6 solid fusion.   Electronically Signed   By: Elon Alas M.D.   On: 02/21/2015 05:50    My personal review of EKG: NSR, No ST changes noted. QTC is within normal limits.   Assessment & Plan  Principal Problem:   Lumbar compression fracture Active Problems:   Coronary atherosclerosis   HYPOTENSION, ORTHOSTATIC   History of cardiovascular disorder   PACEMAKER, PERMANENT   A-fib   Hx of bladder cancer   Dyslipidemia   COPD (chronic obstructive pulmonary disease)   Chronic respiratory failure with hypoxia   IPF (idiopathic pulmonary fibrosis)   Compression fracture   UTI (lower urinary tract infection)   Diarrhea    Acute lumbar compression fracture Patient unable to walk. EDP discussed the fracture with Dr. Georgie Chard of orthopedic surgery who recommended no surgical intervention. Patient will need physical therapy evaluation, pain management, likely skilled nursing rehabilitation. Uncertain if he would benefit from angioplasty.  Orthostatic hypotension Systolic 409 lying, 811 sitting. Patient has a  history of orthostatic hypotension per [name]. Is not on any medications for it. Will give IV hydration for 24 hours check orthostatics daily. Physical therapy evaluation. May benefit from compression hose  Urinary tract infection Patient with urostomy in place. Urinalysis appeared infected. Started on Rocephin. Urine culture pending.  Diarrhea 2 days Placed on enteric precautions, GI pathogen panel and C. difficile pending. No antibiotics started at this point. We'll gently rehydrate.  Hold lasix  Atrial fibrillation On metoprolol and Coumadin. Currently in sinus rhythm, and rate controlled. Coumadin managed per pharmacy.  Coronary artery disease status post pacemaker placement and CABG Stable. Not currently having chest pain. On simvastatin and Coumadin therapy, nitroglycerin when necessary chest pain.  Idiopathic pulmonary fibrosis with COPD Chronically on 2 L of oxygen at home. Will continue home medications including DuoNeb's, Spiriva Symbicort and albuterol. Currently holding Lasix as the patient is dehydrated. Will check 2-D echo as he is complaining about worsening dyspnea on exertion  Diastolic Dysfunction Grade 1 DD in 2015 noted on Echo.  Patient complaining of worsening dyspnea on exertion. Will recheck  2-D echo. Holding Lasix due to diarrhea and dehydration.    Consultants Called:  none  Family Communication: wife, daughters at bedside.   Code Status:  DNR  Condition:  Guarded  Potential Disposition: D/C to [name] in 2-3 days.  May need ALF vs SNF.  Time spent in minutes : 60 min   Imogene Burn,  PA-C on 02/21/2015 at 12:20 PM Between 7am to 7pm - Pager - [phone] After 7pm go to [email or web address] - password [email or web address] And look for the night coverage person covering me after hours  [facility]

## 2015-02-21 NOTE — Progress Notes (Signed)
  Echocardiogram 2D Echocardiogram has been performed.  [name] 02/21/2015, 3:30 PM

## 2015-02-22 DIAGNOSIS — S32000A Wedge compression fracture of unspecified lumbar vertebra, initial encounter for closed fracture: Secondary | ICD-10-CM | POA: Diagnosis not present

## 2015-02-22 DIAGNOSIS — N390 Urinary tract infection, site not specified: Secondary | ICD-10-CM | POA: Diagnosis not present

## 2015-02-22 DIAGNOSIS — R197 Diarrhea, unspecified: Secondary | ICD-10-CM | POA: Diagnosis not present

## 2015-02-22 DIAGNOSIS — S32000S Wedge compression fracture of unspecified lumbar vertebra, sequela: Secondary | ICD-10-CM | POA: Diagnosis not present

## 2015-02-22 DIAGNOSIS — M546 Pain in thoracic spine: Secondary | ICD-10-CM | POA: Diagnosis not present

## 2015-02-22 DIAGNOSIS — S22060S Wedge compression fracture of T7-T8 vertebra, sequela: Secondary | ICD-10-CM | POA: Diagnosis not present

## 2015-02-22 DIAGNOSIS — I482 Chronic atrial fibrillation: Secondary | ICD-10-CM | POA: Diagnosis not present

## 2015-02-22 DIAGNOSIS — J9611 Chronic respiratory failure with hypoxia: Secondary | ICD-10-CM | POA: Diagnosis not present

## 2015-02-22 DIAGNOSIS — J449 Chronic obstructive pulmonary disease, unspecified: Secondary | ICD-10-CM | POA: Diagnosis not present

## 2015-02-22 DIAGNOSIS — I951 Orthostatic hypotension: Secondary | ICD-10-CM | POA: Diagnosis not present

## 2015-02-22 DIAGNOSIS — I4891 Unspecified atrial fibrillation: Secondary | ICD-10-CM | POA: Diagnosis not present

## 2015-02-22 LAB — CBC
HCT: 33.4 % — ABNORMAL LOW (ref 39.0–52.0)
Hemoglobin: 11.3 g/dL — ABNORMAL LOW (ref 13.0–17.0)
MCH: 30.1 pg (ref 26.0–34.0)
MCHC: 33.8 g/dL (ref 30.0–36.0)
MCV: 89.1 fL (ref 78.0–100.0)
PLATELETS: 172 10*3/uL (ref 150–400)
RBC: 3.75 MIL/uL — ABNORMAL LOW (ref 4.22–5.81)
RDW: 15.9 % — AB (ref 11.5–15.5)
WBC: 12.1 10*3/uL — ABNORMAL HIGH (ref 4.0–10.5)

## 2015-02-22 LAB — BASIC METABOLIC PANEL
Anion gap: 6 (ref 5–15)
BUN: 15 mg/dL (ref 6–20)
CALCIUM: 8.3 mg/dL — AB (ref 8.9–10.3)
CO2: 23 mmol/L (ref 22–32)
Chloride: 107 mmol/L (ref 101–111)
Creatinine, Ser: 0.9 mg/dL (ref 0.61–1.24)
GFR calc Af Amer: >60 mL/min (ref >60)
GLUCOSE: 98 mg/dL (ref 65–99)
Potassium: 4.1 mmol/L (ref 3.5–5.1)
SODIUM: 136 mmol/L (ref 135–145)

## 2015-02-22 LAB — URINE CULTURE

## 2015-02-22 LAB — PROTIME-INR
INR: 2.98 — ABNORMAL HIGH (ref 0.00–1.49)
PROTHROMBIN TIME: 30.4 s — AB (ref 11.6–15.2)

## 2015-02-22 MED ORDER — WARFARIN SODIUM 7.5 MG PO TABS
7.5000 mg | ORAL_TABLET | ORAL | Status: DC
  Administered 2015-02-23: 7.5 mg via ORAL
  Filled 2015-02-22: qty 1

## 2015-02-22 MED ORDER — WARFARIN SODIUM 5 MG PO TABS
5.0000 mg | ORAL_TABLET | ORAL | Status: DC
  Administered 2015-02-22 – 2015-02-24 (×2): 5 mg via ORAL
  Filled 2015-02-22 (×2): qty 1

## 2015-02-22 MED ORDER — HYDROMORPHONE HCL 1 MG/ML IJ SOLN
0.5000 mg | INTRAMUSCULAR | Status: DC | PRN
  Administered 2015-02-22 – 2015-02-23 (×7): 1 mg via INTRAVENOUS
  Filled 2015-02-22 (×7): qty 1

## 2015-02-22 MED ORDER — MIDODRINE HCL 5 MG PO TABS
5.0000 mg | ORAL_TABLET | Freq: Three times a day (TID) | ORAL | Status: DC
  Administered 2015-02-22 – 2015-03-02 (×25): 5 mg via ORAL
  Filled 2015-02-22 (×26): qty 1

## 2015-02-22 MED ORDER — ENSURE ENLIVE PO LIQD
237.0000 mL | Freq: Two times a day (BID) | ORAL | Status: DC
  Administered 2015-02-23 – 2015-02-28 (×11): 237 mL via ORAL
  Filled 2015-02-22 (×18): qty 237

## 2015-02-22 NOTE — Progress Notes (Addendum)
TELE applied and confirmed. Attempted to check ortho static again with patient, he refused at this time.  Ave Filter, RN

## 2015-02-22 NOTE — Progress Notes (Signed)
PROGRESS NOTE  Peter Richards DGL:[id number] DOB: Nov 26, 1928 DOA: 02/21/2015 PCP: Mathews Argyle, MD  Assessment/Plan: Acute lumbar compression fracture EDP discussed the fracture with Dr. Georgie Chard of orthopedic surgery who recommended no surgical intervention or bracing -physical therapy evaluation -pain management  Orthostatic hypotension -history of orthostatic hypotension per [name]. -IVF -ted hose  Urinary tract infection - Rocephin - Urine culture pending.  Diarrhea 2 days Placed on enteric precautions, GI pathogen panel and C. difficile pending. No antibiotics started at this point. We'll gently rehydrate. Hold lasix  Atrial fibrillation On metoprolol and Coumadin. Currently in sinus rhythm, and rate controlled. Coumadin managed per pharmacy.  Coronary artery disease status post pacemaker placement and CABG Stable. Not currently having chest pain. On simvastatin and Coumadin therapy, nitroglycerin when necessary chest pain.  Idiopathic pulmonary fibrosis with COPD Chronically on 2 L of oxygen at home. Will continue home medications including DuoNeb's, Spiriva Symbicort and albuterol.  Diastolic Dysfunction Grade 1 DD in 2015 noted on Echo. Patient complaining of worsening dyspnea on exertion. -2-D echo -Holding Lasix due to diarrhea and dehydration.    Code Status: DNR Family Communication: patient Disposition Plan:    Consultants:  PT  NS- phone  Procedures:     HPI/Subjective: Morphine is not controlling Peter Richards pain  Objective: Filed Vitals:   02/22/15 0500  BP: 122/73  Pulse: 63  Temp: 98.2 F (36.8 C)  Resp: 16    Intake/Output Summary (Last 24 hours) at 02/22/15 0935 Last data filed at 02/22/15 0749  Gross per 24 hour  Intake 971.67 ml  Output   1575 ml  Net -603.33 ml   Filed Weights   02/21/15 0407  Weight: 72.576 kg (160 lb)    Exam:   General:  Awake, up working with PT  Cardiovascular:  irr  Respiratory: clear, no wheezing  Abdomen: +BS, soft  Musculoskeletal: no edema   Data Reviewed: Basic Metabolic Panel:  Recent Labs Lab 02/21/15 0430 02/22/15 0629  NA 137 136  K 3.9 4.1  CL 102 107  CO2 26 23  GLUCOSE 100* 98  BUN 22* 15  CREATININE 1.10 0.90  CALCIUM 9.1 8.3*   Liver Function Tests: No results for input(s): AST, ALT, ALKPHOS, BILITOT, PROT, ALBUMIN in the last 168 hours. No results for input(s): LIPASE, AMYLASE in the last 168 hours. No results for input(s): AMMONIA in the last 168 hours. CBC:  Recent Labs Lab 02/21/15 0430 02/22/15 0629  WBC 15.7* 12.1*  NEUTROABS 11.9*  --   HGB 12.6* 11.3*  HCT 37.7* 33.4*  MCV 89.1 89.1  PLT 214 172   Cardiac Enzymes:  Recent Labs Lab 02/21/15 0430  TROPONINI <0.03   BNP (last 3 results)  Recent Labs  08/20/14 2155  BNP 49.4    ProBNP (last 3 results)  Recent Labs  12/31/14 0938  PROBNP 43.0    CBG: No results for input(s): GLUCAP in the last 168 hours.  Recent Results (from the past 240 hour(s))  Culture, Urine     Status: None   Collection Time: 02/21/15  1:22 PM  Result Value Ref Range Status   Specimen Description URINE, RANDOM  Final   Special Requests NONE  Final   Culture MULTIPLE SPECIES PRESENT, SUGGEST RECOLLECTION  Final   Report Status 02/22/2015 FINAL  Final     Studies: Dg Thoracic Spine W/swimmers  02/21/2015   CLINICAL DATA:  [place] from bed last night. Intra scapular and low back pain. History of lumbar laminectomies.  EXAM: LUMBAR SPINE - COMPLETE 4+ VIEW; THORACIC SPINE - 3 VIEWS  COMPARISON:  CT abdomen and pelvis July 05, 2014 and CT chest January 07, 2015  FINDINGS: Mild T7 compression fracture is new from prior chest CT. The remaining thoracic vertebra bodies appear intact and aligned with maintenance of thoracic kyphosis osteopenia. Moderate lower thoracic disc height loss, endplate sclerosis and marginal spurring consistent with degenerative discs.  Partially imaged ACDF. Broad thoracolumbar dextroscoliosis. LEFT cardiac pacemaker in situ. Extensive fibrotic changes of the lungs consistent with history of interstitial lung disease. Status post median sternotomy.  Lumbar vertebral bodies are intact and aligned, maintenance of lumbar lordosis. Mild chronic L2 compression fracture, status post cement augmentation. Mild chronic L3 compression deformity. Status post L4-5 PLIF, hardware appears intact, no periprosthetic lucency. Maintenance of lumbar lordosis. Broad levoscoliosis. No definite pars interarticularis defects though, partially obscured by hardware. Osteopenia. Surgical clips project in LEFT abdomen. Mild aortoiliac vascular calcifications.  IMPRESSION: Mild T7 compression fracture is likely acute, new from January 07, 2015. No thoracic malalignment.  No acute lumbar spine fracture deformity or malalignment.  Chronic L2 and L3 mild compression fractures, status post L2 cement augmentation. L4-5 PLIF.   Electronically Signed   By: Elon Alas M.D.   On: 02/21/2015 05:06   Dg Lumbar Spine Complete  02/21/2015   CLINICAL DATA:  [place] from bed last night. Intra scapular and low back pain. History of lumbar laminectomies.  EXAM: LUMBAR SPINE - COMPLETE 4+ VIEW; THORACIC SPINE - 3 VIEWS  COMPARISON:  CT abdomen and pelvis July 05, 2014 and CT chest January 07, 2015  FINDINGS: Mild T7 compression fracture is new from prior chest CT. The remaining thoracic vertebra bodies appear intact and aligned with maintenance of thoracic kyphosis osteopenia. Moderate lower thoracic disc height loss, endplate sclerosis and marginal spurring consistent with degenerative discs. Partially imaged ACDF. Broad thoracolumbar dextroscoliosis. LEFT cardiac pacemaker in situ. Extensive fibrotic changes of the lungs consistent with history of interstitial lung disease. Status post median sternotomy.  Lumbar vertebral bodies are intact and aligned, maintenance of lumbar lordosis.  Mild chronic L2 compression fracture, status post cement augmentation. Mild chronic L3 compression deformity. Status post L4-5 PLIF, hardware appears intact, no periprosthetic lucency. Maintenance of lumbar lordosis. Broad levoscoliosis. No definite pars interarticularis defects though, partially obscured by hardware. Osteopenia. Surgical clips project in LEFT abdomen. Mild aortoiliac vascular calcifications.  IMPRESSION: Mild T7 compression fracture is likely acute, new from January 07, 2015. No thoracic malalignment.  No acute lumbar spine fracture deformity or malalignment.  Chronic L2 and L3 mild compression fractures, status post L2 cement augmentation. L4-5 PLIF.   Electronically Signed   By: Elon Alas M.D.   On: 02/21/2015 05:06   Ct Head Wo Contrast  02/21/2015   CLINICAL DATA:  Dizzy on way to bathroom, fell and hit back of head on dresser. Headache headaches and extreme lower cervical neck pain.  EXAM: CT HEAD WITHOUT CONTRAST  CT CERVICAL SPINE WITHOUT CONTRAST  TECHNIQUE: Multidetector CT imaging of the head and cervical spine was performed following the standard protocol without intravenous contrast. Multiplanar CT image reconstructions of the cervical spine were also generated.  COMPARISON:  CT head February 25, 2013  FINDINGS: CT HEAD FINDINGS  The ventricles and sulci are normal for age. No intraparenchymal hemorrhage, mass effect nor midline shift. Patchy supratentorial white matter hypodensities are within normal range for patient's age and though non-specific suggest sequelae of chronic small vessel ischemic disease. No acute large  vascular territory infarcts.  No abnormal extra-axial fluid collections. Basal cisterns are patent. Moderate calcific atherosclerosis of the carotid siphons.  No skull fracture. The included ocular globes and orbital contents are non-suspicious. Status post bilateral ocular lens implants. Trace paranasal sinus mucosal thickening without air-fluid levels. The  mastoid air cells are well aerated. Patient is edentulous. Small amount of LEFT suboccipital scalp soft tissue stranding.  CT CERVICAL SPINE FINDINGS  Cervical vertebral bodies and posterior elements are intact. Grade 1 C7-T1 anterolisthesis without spondylolysis. C4-5 and C5-6 ACDF with solid interbody fusion, anterior plate and screw fixation appears intact, well seated without periprosthetic lucency. Severe C6-7 moderate to severe C7-T1 disc height loss, uncovertebral hypertrophy and endplate spurring compatible with degenerative disc, mild at C3-4. C1-2 articulation maintained with moderate arthropathy. Small amount of calcified pannus about the odontoid process can be seen with CPPD. No destructive bony lesions. Severe RIGHT C2-3 neural foraminal narrowing. Moderate to severe C7-T1 neural foraminal narrowing.  Scarring in the upper lobes of the lungs, partially imaged, with bullous changes. No prevertebral soft tissue swelling. Pacemaker wires noted. Moderate calcific atherosclerosis of the RIGHT carotid bulb.  IMPRESSION: CT HEAD: No acute intracranial process.  Stable chronic changes of the brain: Involutional changes and mild to moderate white matter small vessel ischemic disease.  CT CERVICAL SPINE: No acute cervical spine fracture. Grade 1 C7-T1 anterolisthesis on degenerative basis.  C4-5 and C5-6 solid fusion.   Electronically Signed   By: Elon Alas M.D.   On: 02/21/2015 05:50   Ct Cervical Spine Wo Contrast  02/21/2015   CLINICAL DATA:  Dizzy on way to bathroom, fell and hit back of head on dresser. Headache headaches and extreme lower cervical neck pain.  EXAM: CT HEAD WITHOUT CONTRAST  CT CERVICAL SPINE WITHOUT CONTRAST  TECHNIQUE: Multidetector CT imaging of the head and cervical spine was performed following the standard protocol without intravenous contrast. Multiplanar CT image reconstructions of the cervical spine were also generated.  COMPARISON:  CT head February 25, 2013  FINDINGS:  CT HEAD FINDINGS  The ventricles and sulci are normal for age. No intraparenchymal hemorrhage, mass effect nor midline shift. Patchy supratentorial white matter hypodensities are within normal range for patient's age and though non-specific suggest sequelae of chronic small vessel ischemic disease. No acute large vascular territory infarcts.  No abnormal extra-axial fluid collections. Basal cisterns are patent. Moderate calcific atherosclerosis of the carotid siphons.  No skull fracture. The included ocular globes and orbital contents are non-suspicious. Status post bilateral ocular lens implants. Trace paranasal sinus mucosal thickening without air-fluid levels. The mastoid air cells are well aerated. Patient is edentulous. Small amount of LEFT suboccipital scalp soft tissue stranding.  CT CERVICAL SPINE FINDINGS  Cervical vertebral bodies and posterior elements are intact. Grade 1 C7-T1 anterolisthesis without spondylolysis. C4-5 and C5-6 ACDF with solid interbody fusion, anterior plate and screw fixation appears intact, well seated without periprosthetic lucency. Severe C6-7 moderate to severe C7-T1 disc height loss, uncovertebral hypertrophy and endplate spurring compatible with degenerative disc, mild at C3-4. C1-2 articulation maintained with moderate arthropathy. Small amount of calcified pannus about the odontoid process can be seen with CPPD. No destructive bony lesions. Severe RIGHT C2-3 neural foraminal narrowing. Moderate to severe C7-T1 neural foraminal narrowing.  Scarring in the upper lobes of the lungs, partially imaged, with bullous changes. No prevertebral soft tissue swelling. Pacemaker wires noted. Moderate calcific atherosclerosis of the RIGHT carotid bulb.  IMPRESSION: CT HEAD: No acute intracranial process.  Stable chronic  changes of the brain: Involutional changes and mild to moderate white matter small vessel ischemic disease.  CT CERVICAL SPINE: No acute cervical spine fracture. Grade 1  C7-T1 anterolisthesis on degenerative basis.  C4-5 and C5-6 solid fusion.   Electronically Signed   By: Elon Alas M.D.   On: 02/21/2015 05:50    Scheduled Meds: . budesonide-formoterol  2 puff Inhalation BID  . cefTRIAXone (ROCEPHIN)  IV  1 g Intravenous Q24H  . escitalopram  20 mg Oral Daily  . fentaNYL  25 mcg Transdermal Q72H  . metoprolol succinate  25 mg Oral Daily  . simvastatin  10 mg Oral QPM  . warfarin  5 mg Oral Q T,Th,S,Su-1800  . [START ON 02/23/2015] warfarin  7.5 mg Oral Q M,W,F-1800  . Warfarin - Pharmacist Dosing Inpatient   Does not apply q1800   Continuous Infusions:  Antibiotics Given (last 72 hours)    Date/Time Action Medication Dose Rate   02/22/15 0814 Given   cefTRIAXone (ROCEPHIN) 1 g in dextrose 5 % 50 mL IVPB 1 g 100 mL/hr      Principal Problem:   Lumbar compression fracture Active Problems:   Coronary atherosclerosis   HYPOTENSION, ORTHOSTATIC   History of cardiovascular disorder   PACEMAKER, PERMANENT   A-fib   Hx of bladder cancer   Dyslipidemia   COPD (chronic obstructive pulmonary disease)   Chronic respiratory failure with hypoxia   IPF (idiopathic pulmonary fibrosis)   Compression fracture   UTI (lower urinary tract infection)   Diarrhea    Time spent: 25 min    VANN, JESSICA  [facility] Pager [phone]. If 7PM-7AM, please contact night-coverage at [email or web address], password [facility] [address] 02/22/2015, 9:35 AM

## 2015-02-22 NOTE — Progress Notes (Signed)
Initial Nutrition Assessment   INTERVENTION:  Provide Ensure Enlive po BID, each supplement provides 350 kcal and 20 grams of protein   NUTRITION DIAGNOSIS:   Inadequate oral intake related to poor appetite as evidenced by 5 percent weight loss in the past 6 weeks.    GOAL:   Patient will meet greater than or equal to 90% of their needs   MONITOR:   PO intake, Supplement acceptance, Weight trends, Labs, Skin  REASON FOR ASSESSMENT:   Consult Assessment of nutrition requirement/status  ASSESSMENT:   81 year old male with atrial fibrillation on coumadin, h/o orthostatic hypotension, h/o CVA, and cervical fusion, presents to ED, with a fall earlier this am. He was found to have a Acute lumbar compression fracture. Orthopedics were consulted and recommended non surgical management.   Pt reports having a poor appetite for the past 5-6 months causing poor intake at meals. He reports drinking Boost supplements twice daily PTA. He states that he weighed 190 lbs one year ago. Per weight history pt was maintaining his weight around 170 lbs this past year with a 9 lb weight loss within the past 6 weeks- 5% wt loss. Pt reports eating about 50% of meals since admission.   Labs: low calcium, low hemoglobin  Diet Order:  Diet Heart Room service appropriate?: Yes; Fluid consistency:: Thin  Skin:  Reviewed, no issues  Last BM:  9/19  Height:   Ht Readings from Last 1 Encounters:  02/21/15 5\' 8"  (1.727 m)    Weight:   Wt Readings from Last 1 Encounters:  02/21/15 160 lb (72.576 kg)    Ideal Body Weight:  70 kg  BMI:  Body mass index is 24.33 kg/(m^2).  Estimated Nutritional Needs:   Kcal:  1750-2000  Protein:  100-110 grams  Fluid:  1.7-2 L/day  EDUCATION NEEDS:   No education needs identified at this time  [place], LDN Inpatient Clinical Dietitian Pager: [phone] After Hours Pager: [phone]

## 2015-02-22 NOTE — Evaluation (Signed)
Physical Therapy Evaluation Patient Details Name: Peter Richards MRN: [id number] DOB: 1928-06-08 Today's Date: 02/22/2015   History of Present Illness  Pt is an 81 y/o male with PMH of A Fib on coumadin, orthostatic hypotension, CVA, MI, pacemaker, AAA, depression, COPD, C4-5 and C5-6 fusion, L4-5 fusion, prostate cancer, bladder cancer.  Pt with recent fall and back pain.  Imaging revealed acute mild T7 compression fracture and chronic L2 and L3 mild compression fractures. Treatment is nonsurgical management at this point.  Clinical Impression  Pt presents with severe back pain following a fall resulting in compression fracture. Session was limited by pain though pt was able to tolerate getting OOB and walking to the chair using RW and Min A.  Pt positive for orthostatic hypotension (BP 160/70 in supine, 142/66 sitting EOB, 113/71 standing, and 131/61 sitting in chair following ambulating in room). He noted symptoms of "feeling faint" only after gait.  Pt presents with impairments as listed below, requiring further PT services to improve safety with functional mobility and increased activity tolerance as pain subsides.  PT recommends SNF upon discharge if pain and othostatic hypotension do not subside.    Follow Up Recommendations  Supervision/Assistance - 24 hour;SNF    Equipment Recommendations  None recommended by PT    Recommendations for Other Services OT consult     Precautions / Restrictions Precautions Precautions: Fall Precaution Comments: RLQ urostomy Restrictions Weight Bearing Restrictions: No      Mobility  Bed Mobility Overal bed mobility: Needs Assistance Bed Mobility: Rolling;Sidelying to Sit Rolling: Min assist (increased time, bed rail, assist to roll, HOB elevated) Sidelying to sit: Min assist;HOB elevated (assist for trunk support)          Transfers Overall transfer level: Needs assistance Equipment used: Rolling walker (2 wheeled) Transfers: Sit  to/from Stand Sit to Stand: Min assist         General transfer comment: verbal cueing to scoot to edge of surface and for hand placement  Ambulation/Gait Ambulation/Gait assistance: Min guard (min guard for safety and stability) Ambulation Distance (Feet): 10 Feet (from opposite side of bed to chair) Assistive device: Rolling walker (2 wheeled) Gait Pattern/deviations: Step-through pattern;Decreased step length - right;Decreased step length - left;Decreased stride length;Antalgic;Trunk flexed Gait velocity: slow Gait velocity interpretation: Below normal speed for age/gender General Gait Details: very antalgic and guarded movement  Stairs            Wheelchair Mobility    Modified Rankin (Stroke Patients Only)       Balance Overall balance assessment: Needs assistance Sitting-balance support: Feet supported;No upper extremity supported Sitting balance-Leahy Scale: Fair (pt leaning on R UE) Sitting balance - Comments: pt with tendnecy to lean on R UE   Standing balance support: Bilateral upper extremity supported;During functional activity Standing balance-Leahy Scale: Poor Standing balance comment: pt with forward flexion and relying heavily on UE support to maintain standing                             Pertinent Vitals/Pain Pain Assessment: 0-10 Pain Score: 10-Worst pain ever Pain Location: back (especially on L) Pain Descriptors / Indicators: Grimacing;Guarding;Moaning;Aching Pain Intervention(s): Monitored during session;Limited activity within patient's tolerance;Premedicated before session;Repositioned    Home Living Family/patient expects to be discharged to:: Private residence Living Arrangements: Spouse/significant other Available Help at Discharge: Family;Available 24 hours/day Type of Home: Apartment Home Access: Level entry     Home Layout: One level Home  Equipment: Gilford Rile - 4 wheels;Cane - single point;Other (comment) (oxygen at home  (2L)) Additional Comments: Pt and his wife reside at [name] in independent living cottage    Prior Function Level of Independence: Independent with assistive device(s) (notes that he typically uses SPC but uses walker at times)         Comments: Wife cooks breakfast/lunch and they go to [employer] for dinner. pt states he uses a cane and furniture walks and was performing ADLs on his own     Hand Dominance   Dominant Hand: Right    Extremity/Trunk Assessment   Upper Extremity Assessment: Defer to OT evaluation           Lower Extremity Assessment: Overall WFL for tasks assessed (some B LE weakness (may be impacted by pain) but overall WFL)         Communication   Communication: HOH  Cognition Arousal/Alertness: Awake/alert Behavior During Therapy: Restless (pt with significant pain) Overall Cognitive Status: Difficult to assess (pt in significant pain potentially affecting cognition)       Memory: Decreased short-term memory         General Comments: pt requesting pain medication multiple times despite PT assuring him the nurse had just given them to him    General Comments General comments (skin integrity, edema, etc.): Pt positive for orthostatic hypotension; noted feeling "faint" only after walking from bed to chair, otherwise pt reported no symptoms.    Exercises        Assessment/Plan    PT Assessment Patient needs continued PT services  PT Diagnosis Difficulty walking;Acute pain   PT Problem List Decreased strength;Decreased activity tolerance;Decreased balance;Decreased mobility;Decreased knowledge of use of DME;Decreased safety awareness;Cardiopulmonary status limiting activity;Pain  PT Treatment Interventions DME instruction;Gait training;Functional mobility training;Therapeutic activities;Therapeutic exercise;Balance training;Patient/family education   PT Goals (Current goals can be found in the Care Plan section) Acute Rehab PT  Goals Patient Stated Goal: none stated PT Goal Formulation: With patient Time For Goal Achievement: 03/08/15 Potential to Achieve Goals: Good    Frequency Min 5X/week   Barriers to discharge        Co-evaluation               End of Session Equipment Utilized During Treatment: Gait belt;Oxygen Activity Tolerance: Patient limited by pain Patient left: in chair;with call bell/phone within reach;with chair alarm set Nurse Communication: Mobility status;Patient requests pain meds         Time: 0826-0853 PT Time Calculation (min) (ACUTE ONLY): 27 min   Charges:   PT Evaluation $Initial PT Evaluation Tier I: 1 Procedure PT Treatments $Gait Training: 8-22 mins $Therapeutic Activity: 8-22 mins   PT G Codes:        Amanda Tocci 03/03/2015, 9:50 AM  Lorita Officer, SPT

## 2015-02-22 NOTE — Progress Notes (Signed)
Occupational Therapy Evaluation Patient Details Name: Peter Richards MRN: [id number] DOB: November 09, 1928 Today's Date: 02/22/2015    History of Present Illness Pt is an 81 y/o male with PMH of A Fib on coumadin, orthostatic hypotension, CVA, MI, pacemaker, AAA, depression, COPD, C4-5 and C5-6 fusion, L4-5 fusion, prostate cancer, bladder cancer.  Pt with recent fall and back pain.  Imaging revealed acute mild T7 compression fracture and chronic L2 and L3 mild compression fractures. Treatment is nonsurgical management at this point.   Clinical Impression   Pt admitted with the above diagnoses and presents with below problem list. Pt will benefit from continued acute OT to address the below listed deficits and maximize independence with BADLs prior to d/c to venue below. PTA pt was mod I with ADLs. Pt presents with significant back pain impacting activity tolerance and level of assist with ADLs. Pt is currently min to mod A with ADLs. Pt will likely need skilled level care at d/c  for further rehab prior to d/c home. OT to continue to follow acutely.      Follow Up Recommendations  SNF;Supervision/Assistance - 24 hour    Equipment Recommendations  Other (comment) (defer to next venue)    Recommendations for Other Services       Precautions / Restrictions Precautions Precautions: Fall Precaution Comments: RLQ urostomy Restrictions Weight Bearing Restrictions: No      Mobility Bed Mobility Overal bed mobility: Needs Assistance Bed Mobility: Rolling;Sidelying to Sit;Sit to Sidelying Rolling: Min assist Sidelying to sit: Mod assist     Sit to sidelying: Min assist General bed mobility comments: HOB flat. assist to power up trunk to come to EOB; also assist to advance BLE to return to sidelying. Pt moaning during bed mobility.   Transfers Overall transfer level: Needs assistance Equipment used: Rolling walker (2 wheeled) Transfers: Sit to/from Stand Sit to Stand: Min assist          General transfer comment: from EOB; cues for technique    Balance Overall balance assessment: Needs assistance Sitting-balance support: Bilateral upper extremity supported;Feet supported Sitting balance-Leahy Scale: Fair     Standing balance support: Bilateral upper extremity supported;During functional activity Standing balance-Leahy Scale: Poor Standing balance comment: heavy reliance on rw                             ADL Overall ADL's : Needs assistance/impaired Eating/Feeding: Set up;Supervision/ safety;Bed level Eating/Feeding Details (indicate cue type and reason): decreased tolerance for sitting due to pain Grooming: Min guard;Minimal assistance;Sitting;Bed level Grooming Details (indicate cue type and reason): decreased tolerance for sitting due to pain Upper Body Bathing: Moderate assistance;Sitting   Lower Body Bathing: Moderate assistance;Sit to/from stand;Cueing for compensatory techniques;Cueing for back precautions   Upper Body Dressing : Moderate assistance;Sitting   Lower Body Dressing: Moderate assistance;Sit to/from stand;Cueing for compensatory techniques;Cueing for back precautions   Toilet Transfer: Min guard;Ambulation;BSC;RW   Toileting- Clothing Manipulation and Hygiene: Sit to/from stand;Maximal assistance       Functional mobility during ADLs: Min guard;Rolling walker General ADL Comments: Pt with significant back pain impacting level of assist with ADLs. Min guard for functional mobility to walk around bed. Pt initiating sitting down due to pain. Pt with decreased tolerance of sitting and standing. Discussed back precautions with pt and family. Also discussed d/c plan with son and wife stating they do not feel comfortable at this point with pt d/cing back to [facility] inquiring about SNF  for rehab at d/c.      Vision     Perception     Praxis      Pertinent Vitals/Pain Pain Assessment: 0-10 Pain Score: 10-Worst pain ever Pain  Location: back Pain Descriptors / Indicators: Grimacing;Guarding;Moaning Pain Intervention(s): Limited activity within patient's tolerance;Monitored during session;Premedicated before session;Repositioned;Utilized relaxation techniques     Hand Dominance Right   Extremity/Trunk Assessment Upper Extremity Assessment Upper Extremity Assessment: Overall WFL for tasks assessed   Lower Extremity Assessment Lower Extremity Assessment: Defer to PT evaluation       Communication Communication Communication: HOH   Cognition Arousal/Alertness: Awake/alert Behavior During Therapy: Restless (pain) Overall Cognitive Status: Within Functional Limits for tasks assessed       Memory: Decreased short-term memory             General Comments       Exercises       Shoulder Instructions      Home Living Family/patient expects to be discharged to:: Private residence Living Arrangements: Spouse/significant other Available Help at Discharge: Family;Available 24 hours/day Type of Home: Apartment Home Access: Level entry     Home Layout: One level     Bathroom Shower/Tub: Teacher, early years/pre: Handicapped height     Home Equipment: Environmental consultant - 4 wheels;Cane - single point;Other (comment);Adaptive equipment (oxygen at home (2L))) Adaptive Equipment: Reacher Additional Comments: Pt and his wife reside at [name] in independent living cottage      Prior Functioning/Environment Level of Independence: Independent with assistive device(s) (notes that he typically uses SPC but uses walker at times))        Comments: Wife cooks breakfast/lunch and they go to [employer] for dinner. pt states he uses a cane and furniture walks and was performing ADLs on his own    OT Diagnosis: Acute pain   OT Problem List: Decreased activity tolerance;Impaired balance (sitting and/or standing);Decreased knowledge of use of DME or AE;Decreased knowledge of precautions;Pain    OT Treatment/Interventions: Self-care/ADL training;DME and/or AE instruction;Therapeutic activities;Patient/family education;Balance training;Therapeutic exercise;Energy conservation    OT Goals(Current goals can be found in the care plan section) Acute Rehab OT Goals Patient Stated Goal: none stated OT Goal Formulation: With patient/family Time For Goal Achievement: 03/01/15 Potential to Achieve Goals: Good ADL Goals Pt Will Perform Grooming: with min guard assist;with adaptive equipment;sitting;standing Pt Will Perform Lower Body Bathing: with min guard assist;with adaptive equipment;sit to/from stand;sitting/lateral leans Pt Will Perform Lower Body Dressing: with min guard assist;with adaptive equipment;sitting/lateral leans;sit to/from stand Pt Will Transfer to Toilet: with min guard assist;ambulating;bedside commode Pt Will Perform Toileting - Clothing Manipulation and hygiene: with min guard assist;with adaptive equipment;sitting/lateral leans;sit to/from stand Additional ADL Goal #1: Pt will perform bed mobility with min guard assist to prepare for OOB ADLs.   OT Frequency: Min 2X/week   Barriers to D/C:            Co-evaluation              End of Session Equipment Utilized During Treatment: Gait belt;Rolling walker;Oxygen  Activity Tolerance: Patient limited by pain Patient left: in bed;with call bell/phone within reach;with bed alarm set;with family/visitor present   Time: 3790-2409 OT Time Calculation (min): 23 min Charges:  OT General Charges $OT Visit: 1 Procedure OT Evaluation $Initial OT Evaluation Tier I: 1 Procedure OT Treatments $Self Care/Home Management : 8-22 mins G-Codes:    Hortencia Pilar 03-20-15, 1:14 PM

## 2015-02-22 NOTE — Progress Notes (Signed)
Fentanyl patch applied on right upper chest. Old patch wasted/placed in sharp, verified with Abelino Derrick, RN

## 2015-02-22 NOTE — Progress Notes (Signed)
ANTICOAGULATION CONSULT NOTE - Follow Up Consult  Pharmacy Consult for Coumadin Indication: atrial fibrillation  Allergies  Allergen Reactions  . Codeine Nausea And Vomiting    Patient Measurements: Height: 5\' 8"  (172.7 cm) Weight: 160 lb (72.576 kg) IBW/kg (Calculated) : 68.4  Vital Signs: Temp: 98.2 F (36.8 C) (09/20 0500) Temp Source: Oral (09/20 0500) BP: 122/73 mmHg (09/20 0500) Pulse Rate: 63 (09/20 0500)  Labs:  Recent Labs  02/21/15 0430 02/22/15 0629  HGB 12.6* 11.3*  HCT 37.7* 33.4*  PLT 214 172  LABPROT 30.7* 30.4*  INR 3.02* 2.98*  CREATININE 1.10 0.90  TROPONINI <0.03  --     Estimated Creatinine Clearance: 59.1 mL/min (by C-G formula based on Cr of 0.9).   PMH: HLD, CAD, HTN, CVA, AFIB, GERD, HA, Arthritis, Osteoporosis, Depression, AAA, Pacemaker, Cervical/Spinal disease(surgery), Prostate CA., HOH, Bladder CA, Urostomy, Anxiety, COPD   Assessment:  81 yo male who presents after a fall with complaints of back pain. He has multiple medical problems including A.fib on Warfarin therapy. No noted bleeding complications today and H/H has been stable for the past 6 months. His platelets are 214K. His last dose was on 9/18. CT of the head reveals nothing acute.  HOME Dose: Warfarin 7.5mg  MWF and 5mg  TTHSS with admit INR 3.02.  Anticoag: Afib. INR 2.98. Hgb 11.3.   Cards: Orthostatic, HLD, brady, CAD, HTN, AS, AAA, pacer, . BP 122/73, HR 63 on Toprol  ID: Dirty UA. WBC 12. Urine cultures with multiple species. Placed on Rocephin  Neuro: h/o CVA 2007, syncope, cervical fusion, HA's, arthritis, depression/anxiety, . S/p fall with acute lumbar compression fx (non-surgical management).  Meds: Lexapro, Fentanyl 25 patch.  GI: diarrhea. F/u cultures  Resp: Symbicort for COPD.  Heme/Onc: prostate cancer, basal cell adenocarcinoma, bladder cancer,    Goal of Therapy:  INR 2-3 Monitor platelets by anticoagulation protocol: Yes   Plan:  HOME Dose:  Warfarin 7.5mg  MWF and 5mg  TTHSS  Watch for interaction with Rocephin to potentially increase INR.    Crystal S. Alford Highland, PharmD, [facility] Clinical Staff Pharmacist Pager [phone]  Eilene Ghazi Stillinger 02/22/2015,9:18 AM

## 2015-02-22 NOTE — Progress Notes (Signed)
Patient removed ted hose and refused for staffs to replaced. SCDs/KPAD refused and removed by pt.   Ave Filter, RN

## 2015-02-23 DIAGNOSIS — N390 Urinary tract infection, site not specified: Secondary | ICD-10-CM | POA: Diagnosis not present

## 2015-02-23 DIAGNOSIS — J9611 Chronic respiratory failure with hypoxia: Secondary | ICD-10-CM

## 2015-02-23 DIAGNOSIS — T148 Other injury of unspecified body region: Secondary | ICD-10-CM | POA: Diagnosis not present

## 2015-02-23 DIAGNOSIS — Z515 Encounter for palliative care: Secondary | ICD-10-CM

## 2015-02-23 DIAGNOSIS — M545 Low back pain: Secondary | ICD-10-CM | POA: Diagnosis not present

## 2015-02-23 DIAGNOSIS — S32000A Wedge compression fracture of unspecified lumbar vertebra, initial encounter for closed fracture: Secondary | ICD-10-CM | POA: Diagnosis not present

## 2015-02-23 DIAGNOSIS — S22060A Wedge compression fracture of T7-T8 vertebra, initial encounter for closed fracture: Secondary | ICD-10-CM | POA: Diagnosis not present

## 2015-02-23 DIAGNOSIS — I951 Orthostatic hypotension: Secondary | ICD-10-CM | POA: Diagnosis not present

## 2015-02-23 LAB — PROTIME-INR
INR: 2.2 — AB (ref 0.00–1.49)
PROTHROMBIN TIME: 24.3 s — AB (ref 11.6–15.2)

## 2015-02-23 MED ORDER — OXYCODONE HCL 5 MG PO TABS
5.0000 mg | ORAL_TABLET | ORAL | Status: DC | PRN
  Administered 2015-02-23 – 2015-02-24 (×4): 5 mg via ORAL
  Filled 2015-02-23 (×4): qty 1

## 2015-02-23 MED ORDER — FENTANYL CITRATE (PF) 100 MCG/2ML IJ SOLN
25.0000 ug | INTRAMUSCULAR | Status: DC | PRN
Start: 2015-02-23 — End: 2015-02-25
  Administered 2015-02-23 – 2015-02-25 (×2): 25 ug via INTRAVENOUS
  Filled 2015-02-23 (×2): qty 2

## 2015-02-23 MED ORDER — FENTANYL 25 MCG/HR TD PT72
50.0000 ug | MEDICATED_PATCH | TRANSDERMAL | Status: DC
  Administered 2015-02-23 – 2015-02-26 (×2): 50 ug via TRANSDERMAL
  Filled 2015-02-23 (×2): qty 2

## 2015-02-23 NOTE — Progress Notes (Signed)
Physical Therapy Treatment Patient Details Name: Peter Richards MRN: [id number] DOB: 1929-02-20 Today's Date: 02/23/2015    History of Present Illness Pt is an 81 y/o male with PMH of A Fib on coumadin, orthostatic hypotension, CVA, MI, pacemaker, AAA, depression, COPD, C4-5 and C5-6 fusion, L4-5 fusion, prostate cancer, bladder cancer.  Pt with recent fall and back pain.  Imaging revealed acute mild T7 compression fracture and chronic L2 and L3 mild compression fractures. Treatment is nonsurgical management at this point.    PT Comments    Pt is alert/oriented.  PT donned ted hose prior to getting OOB. Pt continues to be limited by 10/10 pain and orthostatic hypotension. BP in supine was 152/70, sitting EOB 122/60, and standing 90/74.  Due to this as well as pt reported lightheadedness, gait was deferred today.  Pt able to perform Bed mobility with Min Assist for trunk support and sit to stand with Min Assist for safety and stability with pt relying heavily on UE support on RW to maintain standing.  Pt requires further PT services to ensure safe functional mobility.  Continue PT POC and progress as pt tolerates.  PT continues to require SNF upon discharge if pain and hypotension do not subside.   Follow Up Recommendations  Supervision/Assistance - 24 hour;SNF     Equipment Recommendations  None recommended by PT    Recommendations for Other Services       Precautions / Restrictions Precautions Precautions: Fall Precaution Comments: RLQ urostomy Restrictions Weight Bearing Restrictions: No    Mobility  Bed Mobility Overal bed mobility: Needs Assistance Bed Mobility: Rolling;Sidelying to Sit Rolling: Modified independent (Device/Increase time) (increased time due to pain) Sidelying to sit: Min assist;HOB elevated (assist for trunk stability; increased time)          Transfers Overall transfer level: Needs assistance Equipment used: Rolling walker (2 wheeled) Transfers:  Sit to/from Stand Sit to Stand: Min assist (min assist for safety and stability)         General transfer comment: sit to stand from EOB; pt positive for orthostatic hypotension  Ambulation/Gait             General Gait Details: gait deferred due to orthostatic hypotension   Stairs            Wheelchair Mobility    Modified Rankin (Stroke Patients Only)       Balance Overall balance assessment: Needs assistance Sitting-balance support: Single extremity supported;Feet supported Sitting balance-Leahy Scale: Fair Sitting balance - Comments: pt with tendency to lean on L UE   Standing balance support: Bilateral upper extremity supported (unable to acheive completely upright due to pain) Standing balance-Leahy Scale: Poor Standing balance comment: pt with forward flexion in standing and relying heavily on UE support to maintain standing                    Cognition Arousal/Alertness: Awake/alert Behavior During Therapy: Restless (pt with significant) Overall Cognitive Status: Within Functional Limits for tasks assessed                 General Comments: Pt educated on importance of calling for nurse to help get OOB/out of chair as he has attempted to do this alone in the past    Exercises      General Comments General comments (skin integrity, edema, etc.): Pt positive for orthostatic hypotension even with [facility] on accompanied by symptoms of lightheadedness with supine to sit and sit to stand.  Pertinent Vitals/Pain Pain Assessment: 0-10 Pain Score: 10-Worst pain ever Pain Location: back (especially on L) Pain Descriptors / Indicators: Grimacing;Guarding;Moaning;Aching Pain Intervention(s): Limited activity within patient's tolerance;Monitored during session;Premedicated before session;Repositioned    Home Living                      Prior Function            PT Goals (current goals can now be found in the care plan  section) Acute Rehab PT Goals Patient Stated Goal: none stated PT Goal Formulation: With patient Time For Goal Achievement: 03/08/15 Potential to Achieve Goals: Good Progress towards PT goals: Not progressing toward goals - comment (Pain limiting pt progress. PT to progress as tolerated.)    Frequency  Min 5X/week    PT Plan Current plan remains appropriate    Co-evaluation             End of Session Equipment Utilized During Treatment: Gait belt;Oxygen Activity Tolerance: Patient limited by pain Patient left: in chair;with call bell/phone within reach;with chair alarm set;with family/visitor present     Time: 4332-9518 PT Time Calculation (min) (ACUTE ONLY): 17 min  Charges:  $Therapeutic Activity: 8-22 mins                    G Codes:      Amanda Tocci 04-Mar-2015, 12:19 PM  Lorita Officer, SPT

## 2015-02-23 NOTE — Progress Notes (Signed)
PROGRESS NOTE  Peter Richards ZRA:[id number] DOB: 11-Jul-1928 DOA: 02/21/2015 PCP: Mathews Argyle, MD  Assessment/Plan: Acute lumbar compression fracture EDP discussed the fracture with Dr. Georgie Chard of orthopedic surgery who recommended no surgical intervention or bracing -physical therapy evaluation -pain management-will ask palliative care to help (had fentanyl patch before admission)  Orthostatic hypotension -history of orthostatic hypotension per [name]. -ted hose  Urinary tract infection - Rocephin- will continue for now as WBC trending down - Urine culture with multiple colonies  Diarrhea 2 days resolved  Atrial fibrillation On metoprolol and Coumadin. Currently in sinus rhythm, and rate controlled. Coumadin managed per pharmacy.  Coronary artery disease status post pacemaker placement and CABG Stable. Not currently having chest pain. On simvastatin and Coumadin therapy, nitroglycerin when necessary chest pain.  Idiopathic pulmonary fibrosis with COPD Chronically on 2 L of oxygen at home. Will continue home medications including DuoNeb's, Spiriva Symbicort and albuterol.  Diastolic Dysfunction Grade 1 DD in 2015 noted on Echo. -2-D echo now similar -Holding Lasix for now    Code Status: DNR Family Communication: patient/son 9/20 Disposition Plan:    Consultants:  PT  ortho- phone via ER  Procedures:     HPI/Subjective: Still c/o sharp pain  Objective: Filed Vitals:   02/23/15 0545  BP: 149/54  Pulse: 65  Temp: 98.7 F (37.1 C)  Resp: 17    Intake/Output Summary (Last 24 hours) at 02/23/15 0815 Last data filed at 02/23/15 0318  Gross per 24 hour  Intake    480 ml  Output   1775 ml  Net  -1295 ml   Filed Weights   02/21/15 0407  Weight: 72.576 kg (160 lb)    Exam:   General:  Awake, up working with PT  Cardiovascular: irr  Respiratory: clear, no wheezing  Abdomen: +BS, soft  Musculoskeletal: no edema    Data Reviewed: Basic Metabolic Panel:  Recent Labs Lab 02/21/15 0430 02/22/15 0629  NA 137 136  K 3.9 4.1  CL 102 107  CO2 26 23  GLUCOSE 100* 98  BUN 22* 15  CREATININE 1.10 0.90  CALCIUM 9.1 8.3*   Liver Function Tests: No results for input(s): AST, ALT, ALKPHOS, BILITOT, PROT, ALBUMIN in the last 168 hours. No results for input(s): LIPASE, AMYLASE in the last 168 hours. No results for input(s): AMMONIA in the last 168 hours. CBC:  Recent Labs Lab 02/21/15 0430 02/22/15 0629  WBC 15.7* 12.1*  NEUTROABS 11.9*  --   HGB 12.6* 11.3*  HCT 37.7* 33.4*  MCV 89.1 89.1  PLT 214 172   Cardiac Enzymes:  Recent Labs Lab 02/21/15 0430  TROPONINI <0.03   BNP (last 3 results)  Recent Labs  08/20/14 2155  BNP 49.4    ProBNP (last 3 results)  Recent Labs  12/31/14 0938  PROBNP 43.0    CBG: No results for input(s): GLUCAP in the last 168 hours.  Recent Results (from the past 240 hour(s))  Culture, Urine     Status: None   Collection Time: 02/21/15  1:22 PM  Result Value Ref Range Status   Specimen Description URINE, RANDOM  Final   Special Requests NONE  Final   Culture MULTIPLE SPECIES PRESENT, SUGGEST RECOLLECTION  Final   Report Status 02/22/2015 FINAL  Final     Studies: No results found.  Scheduled Meds: . budesonide-formoterol  2 puff Inhalation BID  . cefTRIAXone (ROCEPHIN)  IV  1 g Intravenous Q24H  . escitalopram  20 mg Oral Daily  .  feeding supplement (ENSURE ENLIVE)  237 mL Oral BID BM  . fentaNYL  25 mcg Transdermal Q72H  . metoprolol succinate  25 mg Oral Daily  . midodrine  5 mg Oral TID WC  . simvastatin  10 mg Oral QPM  . warfarin  5 mg Oral Q T,Th,S,Su-1800  . warfarin  7.5 mg Oral Q M,W,F-1800  . Warfarin - Pharmacist Dosing Inpatient   Does not apply q1800   Continuous Infusions:  Antibiotics Given (last 72 hours)    Date/Time Action Medication Dose Rate   02/22/15 0814 Given   cefTRIAXone (ROCEPHIN) 1 g in dextrose 5 %  50 mL IVPB 1 g 100 mL/hr      Principal Problem:   Lumbar compression fracture Active Problems:   Coronary atherosclerosis   HYPOTENSION, ORTHOSTATIC   History of cardiovascular disorder   PACEMAKER, PERMANENT   A-fib   Hx of bladder cancer   Dyslipidemia   COPD (chronic obstructive pulmonary disease)   Chronic respiratory failure with hypoxia   IPF (idiopathic pulmonary fibrosis)   Compression fracture   UTI (lower urinary tract infection)   Diarrhea    Time spent: 25 min    Peter Richards  [facility] Pager [phone]. If 7PM-7AM, please contact night-coverage at [email or web address], password [facility] 02/23/2015, 8:15 AM  LOS: 1 day

## 2015-02-23 NOTE — Consult Note (Signed)
Consultation Note Date: 02/23/2015   Patient Name: Peter Richards  DOB: [birth year]  MRN: [id number]  Age / Sex: 81 y.o., male   PCP: Peter Manes, MD Referring Physician: Geradine Girt, DO  Reason for Consultation: Pain control  Palliative Care Assessment and Plan Summary of Established Goals of Care and Medical Treatment Preferences   Clinical Assessment/Narrative: Peter Richards tells me that he has Fentanyl patch 12 mcg/hour that he changes every 3 days.  He had considered increasing dose, with approval of PCP, Dr. Felipa Richards, but the cost was "astronomical" and he therefore decided to continue with 12 mcg transdermal.  He tells me that he manages his pain with hydrocodone 5/325 mg Q 6 hours PRN, but he takes "3-4 times per week".  He tells me that his pain is 9 or 10 on the 10 scale at this time.  At home is usually 4-5 and his goal is 0.  He tells me that he had back surgery 3 years ago, with screws and pins, that did not help his pain.  His bowel regimen has been effective with use of prunes and MOM PRN. States usual is BM every 1-1.5 days.   Contacts/Participants in Discussion: Primary Decision Maker: Peter Richards is able to make his own decisions.  HCPOA: yes , daughter, Peter Richards Lives with wife of 10 years, Peter Richards (age 85) who has dementia.  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 States he is able to complete ADL's prior to this admission.  Son Peter Richards and daughter Peter Richards also available to help.   Code Status/Advance Care Planning:  DNR  We discussed the MOST form, but not completed.   Symptom Management:   Dilaudid 0.5-1 mg IV Q 4 hours PRN ( 1 mg used 5 times in last 24 hours) ;  Hydrocodone 5/325 mg PO Q 6 hours PRN (used 4 times in last 24 hours);   Fentanyl 25 mcg/hr transdermal Q 72 hours (applied 9/20 at 1000).  D/C Hydrocodone, increase Fentanyl patch to 50 mcg/ hour Q 72 hours, add oxycodone 5 mg PO Q 4 hours PRN, and backup of Fentanyl 1  mg IV Q 2 hours PRN, with use of oral oxy as first line.    Zofran 4 mg PO/IV Q 6 hours PRN.   Xanax 0.25 mg PO QD PRN  Palliative Prophylaxis: loose stool at this time.  Peter Richards tells me that he usually has BM every 1-1.5 days.  Eats prunes and takes MOM PRN (effective).   Psycho-social/Spiritual:   Support System:  Lives with wife of 38 years, Peter Richards who has dementia.  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  Son Peter Richards and daughter Peter Richards also available to help.   Desire for further Chaplaincy support: not at this time.   Prognosis: Unable to determine  Discharge Planning:  [name] for rehab with Palliative care service follow-up       Chief Complaint: Fall and back pain.  History of Present Illness:   Peter Richards is a 81 y.o. male, with A. fib on Coumadin, pulmonary fibrosis, orthostasis, pacemaker in place, history of CVA, and cervical fusion/ACDF who fell at 2 AM on 9/19.  He presented to the emergency department with acute on chronic back pain.  Peter Richards reports that he awoke abruptly this morning needing have diarrhea bowel movement. He attempted to go to the bathroom  but fell as he rounded the corner of his bed. He remembers hitting the floor and hitting his head. His back pain is located centrally, it's described as stabbing, it is constant, movement makes it worse, there are no relieving factors. He reports that for the past week his appetite has been decreased. For the last 2 days he has been having watery diarrhea bowel movements, approximately 2 per day. He also describes a lower abdominal pain. He is being tested for GI pathogens and is under enteric precautions.  He denies chest pain, palpitations, lower extremity swelling, fever and cough. He does report a decreased exercise tolerance over the past several months. He used to be able to walk down the hallway  without becoming winded now he can barely make it across the room. He is on 2 L of oxygen at home. In the emergency department his white count is elevated at 15.7, INR is within therapeutic range. X-ray of the lumbar spine shows mild T7 compression fracture, UA is positive for infection. CT head shows no acute changes or bleeding.  He is being managed medically with goal of ideal pain control.   Primary Diagnoses  Present on Admission:  . UTI (lower urinary tract infection) . Lumbar compression fracture . A-fib . Chronic respiratory failure with hypoxia . Coronary atherosclerosis . Dyslipidemia . HYPOTENSION, ORTHOSTATIC . COPD (chronic obstructive pulmonary disease) . IPF (idiopathic pulmonary fibrosis) . PACEMAKER, PERMANENT . Diarrhea  Palliative Review of Systems: Peter Richards tells me that his pain is 9 or 10 of 10 and that he has been waiting for pain medications for two hours. (being told that it wasn't time yet.)  I have reviewed the medical record, interviewed the patient and family, and examined the patient. The following aspects are pertinent.  Past Medical History  Diagnosis Date  . Hyperlipidemia   . Orthostatic hypotension   . Bradycardia   . Syncope     s/p PTVP  . Myocardial infarction     1981  . CAD (coronary artery disease)     prior CABG in 2002 negative Myoview in 2009  . Hypertension   . Stroke     TIA  2007    . GERD (gastroesophageal reflux disease)   . Headache(784.0)   . Arthritis     OSTEOPOROSIS  . Aortic stenosis, mild     by 01/08/08 echo  . Chronic anticoagulation   . Depression   . AAA (abdominal aortic aneurysm)   . Heart attack   . Pacemaker     medtronic pacer  . History of cervical spinal surgery     over [place]  [name] HOH (hard of hearing)     BILATERAL HEARING AIDS  . Prostate cancer   . Basal cell adenocarcinoma     'bottom lip" (11/06/2012)  . Bladder cancer   . Attention to urostomy     "since 2006" (11/06/2012)  .  Atrial fibrillation     on amiodarone and has PTVP in place  . Exertional shortness of breath   . Chronic radicular pain of lower back   . Anxiety   . COPD (chronic obstructive pulmonary disease)    Social History   Social History  . Marital Status: Married    Spouse Name: N/A  . Number of Children: 3  . Years of Education: N/A   Occupational History  . Retired    Social History Main Topics  . Smoking status: Former Smoker -- 3.00 packs/day for 50  years    Types: Cigarettes    Quit date: 06/04/1978  . Smokeless tobacco: Current User    Types: Snuff  . Alcohol Use: No     Comment: rare "once a month"  . Drug Use: No  . Sexual Activity: No   Other Topics Concern  . None   Social History Narrative   Family History  Problem Relation Age of Onset  . Cancer Father     stomach  . Cancer Sister     lung  . Heart disease Sister   . Hypertension Sister   . Heart disease Mother   . Hypertension Mother   . Heart attack Neg Hx   . Stroke Neg Hx    Scheduled Meds: . budesonide-formoterol  2 puff Inhalation BID  . cefTRIAXone (ROCEPHIN)  IV  1 g Intravenous Q24H  . escitalopram  20 mg Oral Daily  . feeding supplement (ENSURE ENLIVE)  237 mL Oral BID BM  . fentaNYL  25 mcg Transdermal Q72H  . metoprolol succinate  25 mg Oral Daily  . midodrine  5 mg Oral TID WC  . simvastatin  10 mg Oral QPM  . warfarin  5 mg Oral Q T,Th,S,Su-1800  . warfarin  7.5 mg Oral Q M,W,F-1800  . Warfarin - Pharmacist Dosing Inpatient   Does not apply q1800   Continuous Infusions:  PRN Meds:.acetaminophen **OR** acetaminophen, albuterol, ALPRAZolam, alum & mag hydroxide-simeth, diclofenac sodium, HYDROcodone-acetaminophen, HYDROmorphone (DILAUDID) injection, ipratropium-albuterol, nitroGLYCERIN, ondansetron **OR** ondansetron (ZOFRAN) IV, zolpidem Medications Prior to Admission:  Prior to Admission medications   Medication Sig Start Date End Date Taking? Authorizing Provider  albuterol  (PROVENTIL HFA;VENTOLIN HFA) 108 (90 BASE) MCG/ACT inhaler Inhale 2 puffs into the lungs every 6 (six) hours as needed for wheezing or shortness of breath. 11/16/14  Yes Collene Gobble, MD  budesonide-formoterol (SYMBICORT) 160-4.5 MCG/ACT inhaler Inhale 2 puffs into the lungs 2 (two) times daily. 12/27/14  Yes Tammy S Parrett, NP  escitalopram (LEXAPRO) 20 MG tablet Take 20 mg by mouth daily.   Yes Historical Provider, MD  fentaNYL (DURAGESIC - DOSED MCG/HR) 25 MCG/HR patch Place 1 patch (25 mcg total) onto the skin every 3 (three) days. 10/06/13  Yes Belkys A Regalado, MD  furosemide (LASIX) 20 MG tablet Take 1 tablet (20 mg total) by mouth daily as needed (increased shortness of breath). 12/17/14  Yes Darlin Coco, MD  ipratropium-albuterol (DUONEB) 0.5-2.5 (3) MG/3ML SOLN Take 3 mLs by nebulization every 4 (four) hours as needed. 08/24/14  Yes Theodis Blaze, MD  metoprolol succinate (TOPROL-XL) 25 MG 24 hr tablet Take 0.5 tablets (12.5 mg total) by mouth daily. Patient taking differently: Take 25 mg by mouth daily.  08/03/14  Yes Darlin Coco, MD  PRESCRIPTION MEDICATION Wears 2 L of O2 continous   Yes Historical Provider, MD  simvastatin (ZOCOR) 10 MG tablet Take 10 mg by mouth every evening.   Yes Historical Provider, MD  VOLTAREN 1 % GEL Apply 2 g topically daily as needed (pain).  12/10/13  Yes Historical Provider, MD  warfarin (COUMADIN) 5 MG tablet Take 5-7.5 mg by mouth every evening. Take 7.5mg  on Monday, Wednesday and Friday. Take 5mg  all other days of the week. 10/06/13  Yes Belkys A Regalado, MD  zolpidem (AMBIEN) 5 MG tablet Take 1 tablet (5 mg total) by mouth at bedtime as needed for sleep. 01/06/15  Yes Darlin Coco, MD  alendronate (FOSAMAX) 70 MG tablet Take 1 tablet by mouth once a week.  12/24/14   Historical Provider, MD  ALPRAZolam Duanne Moron) 0.25 MG tablet Take 1 tablet (0.25 mg total) by mouth daily as needed for anxiety. 01/12/15   Brand Males, MD  HYDROcodone-acetaminophen  (NORCO/VICODIN) 5-325 MG per tablet Take 1 tablet by mouth every 6 (six) hours as needed for moderate pain or severe pain.    Historical Provider, MD  nitroGLYCERIN (NITROSTAT) 0.4 MG SL tablet Place 0.4 mg under the tongue every 5 (five) minutes as needed for chest pain (MAX 3 TABLETS).    Historical Provider, MD  NON FORMULARY Alendronate sodium 70 mg 1 per week    Historical Provider, MD  tiotropium (SPIRIVA) 18 MCG inhalation capsule Place 1 capsule (18 mcg total) into inhaler and inhale daily as needed (for shortness of breath). 02/03/14   Collene Gobble, MD   Allergies  Allergen Reactions  . Codeine Nausea And Vomiting   CBC:    Component Value Date/Time   WBC 12.1* 02/22/2015 0629   HGB 11.3* 02/22/2015 0629   HCT 33.4* 02/22/2015 0629   PLT 172 02/22/2015 0629   MCV 89.1 02/22/2015 0629   NEUTROABS 11.9* 02/21/2015 0430   LYMPHSABS 1.9 02/21/2015 0430   MONOABS 1.6* 02/21/2015 0430   EOSABS 0.3 02/21/2015 0430   BASOSABS 0.0 02/21/2015 0430   Comprehensive Metabolic Panel:    Component Value Date/Time   NA 136 02/22/2015 0629   K 4.1 02/22/2015 0629   CL 107 02/22/2015 0629   CO2 23 02/22/2015 0629   BUN 15 02/22/2015 0629   CREATININE 0.90 02/22/2015 0629   GLUCOSE 98 02/22/2015 0629   CALCIUM 8.3* 02/22/2015 0629   AST 29 08/21/2014 0439   ALT 14 08/21/2014 0439   ALKPHOS 55 08/21/2014 0439   BILITOT 0.5 08/21/2014 0439   PROT 6.1 08/21/2014 0439   ALBUMIN 3.1* 08/21/2014 0439    Physical Exam: Vital Signs: BP 142/69 mmHg  Pulse 82  Temp(Src) 98.3 F (36.8 C) (Oral)  Resp 19  Ht 5\' 8"  (1.727 m)  Wt 72.576 kg (160 lb)  BMI 24.33 kg/m2  SpO2 98% SpO2: SpO2: 98 % O2 Device: O2 Device: Not Delivered O2 Flow Rate: O2 Flow Rate (L/min): 2 L/min Intake/output summary:  Intake/Output Summary (Last 24 hours) at 02/23/15 1554 Last data filed at 02/23/15 1053  Gross per 24 hour  Intake    600 ml  Output   1700 ml  Net  -1100 ml   LBM: Last BM Date:  02/20/15 Baseline Weight: Weight: 72.576 kg (160 lb) Most recent weight: Weight: 72.576 kg (160 lb)  Exam Findings:  Constitutional: Elderly frail, lying in bed. Makes and keeps eye contact.  Resp:   Even and non labored.  GI: abd soft, flat, non tender.  Musc/skel:  Marked pain with movement of lower legs or turning in bed.          Palliative Performance Scale:              PPS two weeks ago: 60% PPS today: 30-40% at best.   Additional Data Reviewed: Recent Labs     02/21/15  0430  02/22/15  0629  WBC  15.7*  12.1*  HGB  12.6*  11.3*  PLT  214  172  NA  137  136  BUN  22*  15  CREATININE  1.10  0.90     Time In: 1500 Time Out:  1600 Time Total:  60 minutes  Greater than 50%  of this time was spent counseling  and coordinating care related to the above assessment and plan. Plan of care discussed with Dr. Hilma Favors and Dr. Eliseo Squires.   Signed by: Drue Novel, NP  Drue Novel, NP  02/23/2015, 3:54 PM  Please contact Palliative Medicine Team phone at [phone] for questions and concerns.

## 2015-02-23 NOTE — Progress Notes (Signed)
ANTICOAGULATION CONSULT NOTE - Follow Up Consult  Pharmacy Consult for Coumadin Indication: atrial fibrillation  Allergies  Allergen Reactions  . Codeine Nausea And Vomiting    Patient Measurements: Height: 5\' 8"  (172.7 cm) Weight: 160 lb (72.576 kg) IBW/kg (Calculated) : 68.4  Vital Signs: Temp: 98.6 F (37 C) (09/21 1023) Temp Source: Oral (09/21 1023) BP: 136/71 mmHg (09/21 1023) Pulse Rate: 88 (09/21 1023)  Labs:  Recent Labs  02/21/15 0430 02/22/15 0629 02/23/15 0614  HGB 12.6* 11.3*  --   HCT 37.7* 33.4*  --   PLT 214 172  --   LABPROT 30.7* 30.4* 24.3*  INR 3.02* 2.98* 2.20*  CREATININE 1.10 0.90  --   TROPONINI <0.03  --   --     Estimated Creatinine Clearance: 59.1 mL/min (by C-G formula based on Cr of 0.9).   PMH: HLD, CAD, HTN, CVA, AFIB, GERD, HA, Arthritis, Osteoporosis, Depression, AAA, Pacemaker, Cervical/Spinal disease(surgery), Prostate CA., HOH, Bladder CA, Urostomy, Anxiety, COPD   Assessment:  81 yo male who presents after a fall with complaints of back pain. He has multiple medical problems including A.fib on Warfarin therapy. Pharmacy is dosing coumadin, INR today= 2.2 and at goal.   HOME Dose: Warfarin 7.5mg  MWF and 5mg  TTHSS with admit INR 3.02.  Goal of Therapy:  INR 2-3 Monitor platelets by anticoagulation protocol: Yes   Plan:  -Continue coumadin at home dose  -PT/INR daily for now  Hildred Laser, Pharm D 02/23/2015 10:52 AM

## 2015-02-24 ENCOUNTER — Inpatient Hospital Stay (HOSPITAL_COMMUNITY): Payer: Commercial Managed Care - HMO

## 2015-02-24 DIAGNOSIS — M549 Dorsalgia, unspecified: Secondary | ICD-10-CM | POA: Insufficient documentation

## 2015-02-24 DIAGNOSIS — M545 Low back pain: Secondary | ICD-10-CM | POA: Diagnosis not present

## 2015-02-24 DIAGNOSIS — I4891 Unspecified atrial fibrillation: Secondary | ICD-10-CM | POA: Diagnosis not present

## 2015-02-24 DIAGNOSIS — S22060A Wedge compression fracture of T7-T8 vertebra, initial encounter for closed fracture: Secondary | ICD-10-CM | POA: Diagnosis not present

## 2015-02-24 DIAGNOSIS — R0600 Dyspnea, unspecified: Secondary | ICD-10-CM | POA: Diagnosis not present

## 2015-02-24 DIAGNOSIS — Z515 Encounter for palliative care: Secondary | ICD-10-CM | POA: Diagnosis not present

## 2015-02-24 DIAGNOSIS — S32000A Wedge compression fracture of unspecified lumbar vertebra, initial encounter for closed fracture: Secondary | ICD-10-CM | POA: Diagnosis not present

## 2015-02-24 DIAGNOSIS — R0603 Acute respiratory distress: Secondary | ICD-10-CM | POA: Insufficient documentation

## 2015-02-24 LAB — BASIC METABOLIC PANEL
Anion gap: 4 — ABNORMAL LOW (ref 5–15)
BUN: 16 mg/dL (ref 6–20)
CHLORIDE: 102 mmol/L (ref 101–111)
CO2: 30 mmol/L (ref 22–32)
Calcium: 9 mg/dL (ref 8.9–10.3)
Creatinine, Ser: 0.86 mg/dL (ref 0.61–1.24)
GFR calc Af Amer: >60 mL/min (ref >60)
GFR calc non Af Amer: >60 mL/min (ref >60)
GLUCOSE: 103 mg/dL — AB (ref 65–99)
POTASSIUM: 4.6 mmol/L (ref 3.5–5.1)
Sodium: 136 mmol/L (ref 135–145)

## 2015-02-24 LAB — BRAIN NATRIURETIC PEPTIDE: B Natriuretic Peptide: 300.4 pg/mL — ABNORMAL HIGH (ref 0.0–100.0)

## 2015-02-24 LAB — CBC
HEMATOCRIT: 34.4 % — AB (ref 39.0–52.0)
Hemoglobin: 11.1 g/dL — ABNORMAL LOW (ref 13.0–17.0)
MCH: 28.9 pg (ref 26.0–34.0)
MCHC: 32.3 g/dL (ref 30.0–36.0)
MCV: 89.6 fL (ref 78.0–100.0)
Platelets: 180 10*3/uL (ref 150–400)
RBC: 3.84 MIL/uL — ABNORMAL LOW (ref 4.22–5.81)
RDW: 15.8 % — AB (ref 11.5–15.5)
WBC: 13.7 10*3/uL — ABNORMAL HIGH (ref 4.0–10.5)

## 2015-02-24 LAB — PROTIME-INR
INR: 2.33 — ABNORMAL HIGH (ref 0.00–1.49)
Prothrombin Time: 25.3 seconds — ABNORMAL HIGH (ref 11.6–15.2)

## 2015-02-24 MED ORDER — CEFUROXIME AXETIL 500 MG PO TABS
500.0000 mg | ORAL_TABLET | Freq: Two times a day (BID) | ORAL | Status: DC
  Administered 2015-02-25 – 2015-02-27 (×6): 500 mg via ORAL
  Filled 2015-02-24 (×6): qty 1

## 2015-02-24 MED ORDER — FUROSEMIDE 20 MG PO TABS
20.0000 mg | ORAL_TABLET | Freq: Every day | ORAL | Status: DC
  Administered 2015-02-24 – 2015-03-02 (×7): 20 mg via ORAL
  Filled 2015-02-24 (×7): qty 1

## 2015-02-24 MED ORDER — LIDOCAINE 5 % EX PTCH
1.0000 | MEDICATED_PATCH | CUTANEOUS | Status: DC
Start: 2015-02-24 — End: 2015-03-02
  Administered 2015-02-24 – 2015-03-01 (×6): 1 via TRANSDERMAL
  Filled 2015-02-24 (×6): qty 1

## 2015-02-24 MED ORDER — OXYCODONE HCL 5 MG PO TABS
10.0000 mg | ORAL_TABLET | ORAL | Status: DC | PRN
  Administered 2015-02-24 – 2015-02-25 (×3): 10 mg via ORAL
  Filled 2015-02-24 (×3): qty 2

## 2015-02-24 NOTE — Progress Notes (Signed)
Physical Therapy Treatment Patient Details Name: Peter Richards MRN: [id number] DOB: Sep 24, 1928 Today's Date: 02/24/2015    History of Present Illness Pt is an 81 y/o male with PMH of A Fib on coumadin, orthostatic hypotension, CVA, MI, pacemaker, AAA, depression, COPD, C4-5 and C5-6 fusion, L4-5 fusion, prostate cancer, bladder cancer.  Pt with recent fall and back pain.  Imaging revealed acute mild T7 compression fracture and chronic L2 and L3 mild compression fractures. Treatment is nonsurgical management at this point.    PT Comments    Pt with decline in mobility today secondary to severe back pain.  Pt has also developed a cough.  Pt with increased pain with supine to/from sit, requiring Mod assist to attain and demonstrating posterior lean while sitting at EOB, requiring physical support to maintain upright.  He was able to stand and take 3 lateral steps toward [facility] using HHA +2 but was unable to attempt gait secondary to pain.  BP was stable (147/58 supine, 143/60 sitting EOB) though pt c/o lightheadedness and requesting to lie back down.  Pt with difficulty following commands or verbally responding to questions at times which he attributes to pain.  Quick screen for stroke signs negative.  Nurse alerted on pt status and his request for medication.  Continue PT POC as pt tolerates. PT continues to recommend SNF following discharge.   Follow Up Recommendations  Supervision/Assistance - 24 hour;SNF     Equipment Recommendations  None recommended by PT    Recommendations for Other Services       Precautions / Restrictions Precautions Precautions: Fall Precaution Comments: RLQ urostomy Restrictions Weight Bearing Restrictions: No    Mobility  Bed Mobility Overal bed mobility: Needs Assistance Bed Mobility: Rolling;Sidelying to Sit;Sit to Supine Rolling: Mod assist (requiring mod assist + bed rail to roll to L side) Sidelying to sit: Mod assist (pt requiring trunk support and  assist to come to sitting)   Sit to supine: Max assist (max assist required secondary to increased pain)   General bed mobility comments: more assisted necessary today secondary to increased pain  Transfers Overall transfer level: Needs assistance Equipment used: 2 person hand held assist Transfers: Sit to/from Stand Sit to Stand: Mod assist;+2 physical assistance         General transfer comment: with use of chuck pad and pt with increased posterior lean  Ambulation/Gait             General Gait Details: deferred due to pain; did take 3 lateral steps to [facility] Mod Assist +2   Stairs            Wheelchair Mobility    Modified Rankin (Stroke Patients Only)       Balance Overall balance assessment: Needs assistance Sitting-balance support: Bilateral upper extremity supported;Feet supported Sitting balance-Leahy Scale: Poor Sitting balance - Comments: pt with signficantly worse balance today with increased posterior lean and inability to maintain without physical support Postural control: Posterior lean Standing balance support: Bilateral upper extremity supported Standing balance-Leahy Scale: Poor Standing balance comment: pt with decline in balance today; posterior lean and HHA +2 to maintain standing                    Cognition Arousal/Alertness: Lethargic (in significant pain) Behavior During Therapy: Flat affect Overall Cognitive Status: Difficult to assess                 General Comments: pt in obvious pain and at times having difficulty  communicating which he attributes to pain    Exercises General Exercises - Lower Extremity Ankle Circles/Pumps: Both;10 reps    General Comments General comments (skin integrity, edema, etc.): Pt BP more stable today though pt noting lightheadedness and requesting to lie back down with sitting and standing. (BP 147/58 supine, 143/69 sitting EOB)  However, pt pain is worse than previous PT sessions,  significantly limiting pt tolerance to activity.  Pt at times not following commands/verbal responding, noting that he was in too much pain.  Quick screen for stroke negative (pt able to smile and stick tongue out).  Nurse notified.      Pertinent Vitals/Pain Pain Assessment: 0-10 Pain Score: 10-Worst pain ever Pain Location: back Pain Descriptors / Indicators: Grimacing;Guarding Pain Intervention(s): Limited activity within patient's tolerance;Monitored during session;Patient requesting pain meds-RN notified    Home Living                      Prior Function            PT Goals (current goals can now be found in the care plan section) Acute Rehab PT Goals Patient Stated Goal: get better PT Goal Formulation: With patient Time For Goal Achievement: 03/08/15 Potential to Achieve Goals: Fair Progress towards PT goals: Not progressing toward goals - comment (Pt with decline in mobility secondary to worsening pain)    Frequency  Min 5X/week    PT Plan Current plan remains appropriate    Co-evaluation             End of Session Equipment Utilized During Treatment: Gait belt;Oxygen Activity Tolerance: Patient limited by pain Patient left: in bed;with call bell/phone within reach;with bed alarm set;with nursing/sitter in room     Time: 4401-0272 PT Time Calculation (min) (ACUTE ONLY): 20 min  Charges:  $Therapeutic Activity: 8-22 mins                    G Codes:      Amanda Tocci 2015/03/04, 11:41 AM  Lorita Officer, SPT

## 2015-02-24 NOTE — Progress Notes (Signed)
Daily Progress Note   Patient Name: Peter Richards       Date: 02/24/2015 DOB: Mar 18, 1929  Age: 81 y.o. MRN#: [id number] Attending Physician: Delfina Redwood, MD Primary Care Physician: Mathews Argyle, MD Admit Date: 02/21/2015  Reason for Consultation/Follow-up: Pain control , now consulted for [name].   Subjective: Peter Richards is lying in bed asking me about his telemetry box as a camera. He tells me that his pain is 10-12 on a 10 scale, and that he is no better.  He does not make eye contact with me today.  I ask where he sees himself when he leave the hospital and he tells me "the graveyard".  I ask what he means and he tells me "on occasion I think I would be better off dead".  I ask about going to rehab and he states that would "not help much".  I ask if he is interested in Hospice at his home and he tells me, "If that would help, I'd be glad to do it".  Peter Richards is confused today.  PMT needs to have a consultation with Peter Richards, his daughter [name].  Call to [place], left VM requesting call to [phone]  To schedule a meeting.   Length of Stay: 2 days  Current Medications: Scheduled Meds:  . budesonide-formoterol  2 puff Inhalation BID  . [START ON 02/25/2015] cefUROXime  500 mg Oral BID WC  . escitalopram  20 mg Oral Daily  . feeding supplement (ENSURE ENLIVE)  237 mL Oral BID BM  . fentaNYL  50 mcg Transdermal Q72H  . furosemide  20 mg Oral Daily  . metoprolol succinate  25 mg Oral Daily  . midodrine  5 mg Oral TID WC  . simvastatin  10 mg Oral QPM  . warfarin  5 mg Oral Q T,Th,S,Su-1800  . warfarin  7.5 mg Oral Q M,W,F-1800  . Warfarin - Pharmacist Dosing Inpatient   Does not apply q1800    Continuous Infusions:    PRN Meds: acetaminophen **OR** acetaminophen, albuterol, ALPRAZolam, alum & mag hydroxide-simeth, diclofenac sodium, fentaNYL (SUBLIMAZE) injection, ipratropium-albuterol, nitroGLYCERIN, ondansetron  **OR** ondansetron (ZOFRAN) IV, oxyCODONE, zolpidem  Palliative Performance Scale: 30%     Vital Signs: BP 118/64 mmHg  Pulse 64  Temp(Src) 97.9 F (36.6 C) (Oral)  Resp 16  Ht 5\' 8"  (1.727 m)  Wt 72.576 kg (160 lb)  BMI 24.33 kg/m2  SpO2 96% SpO2: SpO2: 96 % O2 Device: O2 Device: Nasal Cannula O2 Flow Rate: O2 Flow Rate (L/min): 2 L/min  Intake/output summary:  Intake/Output Summary (Last 24 hours) at 02/24/15 1557 Last data filed at 02/24/15 1504  Gross per 24 hour  Intake    290 ml  Output   1225 ml  Net   -935 ml   LBM:   Baseline Weight: Weight: 72.576 kg (160 lb) Most recent weight: Weight: 72.576 kg (160 lb)  Physical Exam: Constitutional: Elderly frail, lying in bed. Does not makes eye contact today.  Resp: Even and non labored.  GI: abd soft, flat, non tender. Urostomy  Musc/skel: Marked pain with movement of lower legs or turning in bed.               Additional Data Reviewed: Recent Labs     02/22/15  0629  02/24/15  0420  WBC  12.1*  13.7*  HGB  11.3*  11.1*  PLT  172  180  NA  136  136  BUN  15  16  CREATININE  0.90  0.86     Problem List:  Patient Active Problem List   Diagnosis Date Noted  . Respiratory distress   . Palliative care encounter   . Compression fracture 02/21/2015  . UTI (lower urinary tract infection) 02/21/2015  . Diarrhea 02/21/2015  . IPF (idiopathic pulmonary fibrosis) 01/12/2015  . Anxiety state 01/12/2015  . Chronic respiratory failure with hypoxia 12/30/2014  . Atypical chest pain 12/30/2014  . Encounter for therapeutic drug monitoring 09/13/2014  . Acute and chronic respiratory failure with hypoxia 08/21/2014  . Low blood pressure 06/30/2014  . Pain in limb 11/09/2013  . Sepsis 10/03/2013  . Right carotid bruit 07/07/2013  . COPD (chronic obstructive pulmonary disease) 06/05/2012  . Interstitial lung disease 01/22/2012  . Lumbar compression fracture 08/24/2011  . Dyslipidemia 05/02/2011  . Low back  pain 05/02/2011  . Hx of bladder cancer 01/01/2011  . A-fib 11/15/2010  . Atrial fibrillation 08/25/2010  . HYPERLIPIDEMIA 11/18/2009  . Coronary atherosclerosis 11/18/2009  . BRADYCARDIA 11/18/2009  . HYPOTENSION, ORTHOSTATIC 11/18/2009  . SYNCOPE 11/18/2009  . CHEST PAIN 11/18/2009  . History of cardiovascular disorder 11/18/2009  . ATRIAL FIBRILLATION, HX OF 11/18/2009  . PACEMAKER, PERMANENT 11/18/2009     Palliative Care Assessment & Plan    Code Status:  DNR  Goals of Care:  Unsure at this time. Rehab vs Hospice?  Symptom Management:  Duragesic 50 mcg/hr Q 72 hours,  Oxycodone 5 mg PO Q 4 hours PRN ( 2 uses in 24 hours) , Fentanyl 25 mcg IV Q 2 hours PRN ( 1 use in 24 hours).   INCREASE Oxycodone to 10 mg PO Q 2 hours PRN.   ADD Lidoderm patch to lumbar area.   Palliative Prophylaxis:  Continues with loose stool   Psycho-social/Spiritual:  Desire for further Chaplaincy support:  Not at this time.    Prognosis: Unable to determine Discharge Planning: Undecided.    Care plan was discussed with nursing staff, Dr. Rowe Pavy and Dr. Conley Canal.   Thank you for allowing the Palliative Medicine Team to assist in the care of this patient.   Time In:  1430 Time Out: 1455 Total Time 25 minutes  Prolonged Time Billed  no     Greater than 50%  of this time was spent counseling and coordinating care related to the above assessment and plan.   Drue Novel, NP  02/24/2015, 3:57 PM  Please contact Palliative Medicine Team phone at [phone] for questions and concerns.

## 2015-02-24 NOTE — Clinical Social Work Note (Addendum)
FL-2 and clinicals faxed to [name] at [facility] for review.   DNR and FL-2 placed on chart for MD signature.  CSW remains available as needed.   Glendon Axe, MSW, LCSWA [phone] 02/24/2015 12:14 PM

## 2015-02-24 NOTE — Clinical Social Work Note (Signed)
Clinical Social Work Assessment  Patient Details  Name: Peter Richards MRN: [id number] Date of Birth: 01/11/1929  Date of referral:  02/23/15               Reason for consult:  Discharge Planning                Permission sought to share information with:  Case Manager, Family Supports, Customer service manager Permission granted to share information::  Yes, Verbal Permission Granted  Name::      Peter Richards )  Agency::  [facility] [place]  Relationship::   (Spouse )  Contact Information:   [phone])  Housing/Transportation Living arrangements for the past 2 months:  [place] of Information:  Patient Patient Interpreter Needed:  None Criminal Activity/Legal Involvement Pertinent to Current Situation/Hospitalization:  No - Comment as needed Significant Relationships:  Spouse, Adult Children Lives with:  Spouse Do you feel safe going back to the place where you live?  Yes Need for family participation in patient care:  No (Coment)  Care giving concerns:  Patient requiring short-term rehab placement and 24 hour supervision and assistance.    Social Worker assessment / plan:  Holiday representative met with patient at bedside in reference to post-acute placement for SNF. CSW introduced CSW role and SNF process. Patient reported that he is familiar with SNF process. Patient also confirmed that he is a resident of [facility]. Patient stated he and his wife have been residents of [facility] for the past 11 years and he is very pleased with the facility. Patient further reported that [name] has 4 levels of care; independent, assisted, skilled and memory care. Patient stated he is agreeable to SNF placement at [name]. CSW explained SNF process in addition to insurance process. Pt expressed understanding. Pt reported other than being in pain he is feeling well. Pt did not reported any  further concerns. CSW has contacted [name] in regards to pt's return and left a message for [employer], admissions coordinator. CSW to complete FL-2 and place on chart for MD signature. CSW will continue to follow pt and pt's family for continued support and to facilitate pt's discharge needs once medically stable.   Employment status:  Retired Forensic scientist:  Medicare PT Recommendations:  [place] / Referral to community resources:  [place]  Patient/Family's Response to care:  Patient alert and oriented. Pt agreeable to SNF placement at [facility]. Pt pleasant and stated he will follow MD's orders and recommendations at discharge. Pt stated family is involved in care and appreciated social work intervention.   Patient/Family's Understanding of and Emotional Response to Diagnosis, Current Treatment, and Prognosis: Pt expressed understanding of medical work up and stated that staff is attentive. Pt involved in care planning. CSW remains available as needed.   Emotional Assessment Appearance:  Appears stated age Attitude/Demeanor/Rapport:   (Pleasant ) Affect (typically observed):  Accepting, Pleasant, Hopeful, Calm Orientation:  Oriented to Self, Oriented to Place, Oriented to  Time, Oriented to Situation Alcohol / Substance use:  Not Applicable Psych involvement (Current and /or in the community):  No (Comment)  Discharge Needs  Concerns to be addressed:  Care Coordination Readmission within the last 30 days:  No Current discharge risk:  Dependent with Mobility Barriers to Discharge:  Continued Medical Work up  [employer], MSW, LCSWA [phone] 02/24/2015 11:15 AM

## 2015-02-24 NOTE — Progress Notes (Signed)
OT Treatment Note  Treatment limited today by increased c/o pain.   02/23/15 1230  OT Visit Information  Last OT Received On 02/23/15  History of Present Illness Pt is an 81 y/o male with PMH of A Fib on coumadin, orthostatic hypotension, CVA, MI, pacemaker, AAA, depression, COPD, C4-5 and C5-6 fusion, L4-5 fusion, prostate cancer, bladder cancer.  Pt with recent fall and back pain.  Imaging revealed acute mild T7 compression fracture and chronic L2 and L3 mild compression fractures. Treatment is nonsurgical management at this point.  OT Time Calculation  OT Start Time (ACUTE ONLY) 1210  OT Stop Time (ACUTE ONLY) 1226  OT Time Calculation (min) 16 min  Precautions  Precautions Fall  Precaution Comments RLQ urostomy  Pain Assessment  Pain Assessment 0-10  Pain Score 8  Pain Location back  Pain Descriptors / Indicators Aching;Grimacing  Pain Intervention(s) Limited activity within patient's tolerance  Cognition  Arousal/Alertness Awake/alert  Behavior During Therapy Flat affect  Overall Cognitive Status Within Functional Limits for tasks assessed  Bed Mobility  Sit to sidelying Mod assist (assist to lift BLE onto bed)  Balance  Sitting balance-Leahy Scale Fair  Standing balance-Leahy Scale Poor  Standing balance comment posterior bias  Transfers -   Overall transfer level Needs assistance  Equipment used Rolling walker (2 wheeled)  Transfers Sit to/from [employer] Transfers  Sit to Stand Min assist  Stand pivot transfers Min assist - attepted to assist with LB ADL. Pt unable to release RW with 1 hand due to poor balance and fear of fallng.  OT - End of Session  Equipment Utilized During Treatment Gait belt;Rolling walker  Activity Tolerance Patient limited by pain  Patient left in bed;with call bell/phone within reach;with bed alarm set  Nurse Communication Mobility status  OT Assessment/Plan  OT Plan Discharge plan remains appropriate  OT Frequency (ACUTE ONLY) Min  2X/week  Follow Up Recommendations SNF;Supervision/Assistance - 24 hour  OT Equipment Other (comment) (TBA)  Acute Rehab OT Goals  Patient Stated Goal to get better  OT Goal Formulation With patient/family  Time For Goal Achievement 03/01/15  Potential to Achieve Goals Good  ADL Goals  Pt Will Perform Grooming with min guard assist;with adaptive equipment;sitting;standing  Pt Will Perform Lower Body Bathing with min guard assist;with adaptive equipment;sit to/from stand;sitting/lateral leans  Pt Will Perform Lower Body Dressing with min guard assist;with adaptive equipment;sitting/lateral leans;sit to/from stand  Pt Will Transfer to Toilet with min guard assist;ambulating;bedside commode  Pt Will Perform Toileting - Clothing Manipulation and hygiene with min guard assist;with adaptive equipment;sitting/lateral leans;sit to/from stand  Additional ADL Goal #1 Pt will perform bed mobility with min guard assist to prepare for OOB ADLs.   OT General Charges  $OT Visit 1 Procedure  OT Treatments  $Self Care/Home Management  8-22 mins  [facility], OTR/L  [phone] 02/23/2015

## 2015-02-24 NOTE — Progress Notes (Addendum)
Paged MD r/t patient's condition. MD ordered for CXR. RT paged for prn neb tx at this time. Patient still c/o back back pain. Pain medication given. Will continue to monitor.   Ave Filter, RN

## 2015-02-24 NOTE — Progress Notes (Signed)
PROGRESS NOTE  Peter Richards SWH:[id number] DOB: 06-18-1928 DOA: 02/21/2015 PCP: Mathews Argyle, MD  Assessment/Plan: Respiratory distress: Has a few rhonchi now but not too bad. Good breath sounds. Stable vital signs. According to nursing staff, was worse earlier. We'll resume Lasix. Check BNP and a chest x-ray.  Acute lumbar compression fracture EDP discussed the fracture with Dr. Georgie Chard of orthopedic surgery who recommended no surgical intervention or bracing -physical therapy evaluation Palliative adjusted medications for same. Still having significant pain. Apparently, patient alluded to palliative that he may want to transition to comfort care. I have asked the palliative team to proceed with goals of care discussion. Discussed with patient's son. He shares healthcare power of attorney with his sister. Phone number on sticky note.  Orthostatic hypotension -history of orthostatic hypotension per [name]. -ted hose  Urinary tract infection Urine culture with multiple bacterial morphotypes. Will change Rocephin to Ceftin.  Diarrhea 2 days resolved  Atrial fibrillation On metoprolol and Coumadin. Currently in sinus rhythm, and rate controlled. Coumadin managed per pharmacy.  Coronary artery disease status post pacemaker placement and CABG Stable. Not currently having chest pain. On simvastatin and Coumadin therapy, nitroglycerin when necessary chest pain.  Idiopathic pulmonary fibrosis with COPD Chronically on 2 L of oxygen at home. Will continue home medications including DuoNeb's, Spiriva Symbicort and albuterol.  Diastolic Dysfunction Grade 1 DD in 2015 noted on Echo. -2-D echo now similar See above  Code Status: DNR Family Communication: son, [name]  Disposition Plan: SNF? Home with hospice?   Consultants:  PT  ortho- phone via ER  PMT  Procedures:     HPI/Subjective: Complaining of pain. Nursing staff reports patient more lethargic and  confused today. Reports congestion on lung exam.  Objective: Filed Vitals:   02/24/15 0927  BP: 118/64  Pulse: 64  Temp: 97.9 F (36.6 C)  Resp: 16    Intake/Output Summary (Last 24 hours) at 02/24/15 1152 Last data filed at 02/24/15 0926  Gross per 24 hour  Intake    290 ml  Output    875 ml  Net   -585 ml   Filed Weights   02/21/15 0407  Weight: 72.576 kg (160 lb)    Exam:   General:  Asleep. Arousable. Appears comfortable. Cooperative, but slightly confused.  Cardiovascular: irr  Respiratory: Breathing nonlabored. Occasional rhonchi. No rales. No wheeze. Good air movement.  Abdomen: +BS, soft, nontender  Musculoskeletal: no edema   Data Reviewed: Basic Metabolic Panel:  Recent Labs Lab 02/21/15 0430 02/22/15 0629 02/24/15 0420  NA 137 136 136  K 3.9 4.1 4.6  CL 102 107 102  CO2 26 23 30   GLUCOSE 100* 98 103*  BUN 22* 15 16  CREATININE 1.10 0.90 0.86  CALCIUM 9.1 8.3* 9.0   Liver Function Tests: No results for input(s): AST, ALT, ALKPHOS, BILITOT, PROT, ALBUMIN in the last 168 hours. No results for input(s): LIPASE, AMYLASE in the last 168 hours. No results for input(s): AMMONIA in the last 168 hours. CBC:  Recent Labs Lab 02/21/15 0430 02/22/15 0629 02/24/15 0420  WBC 15.7* 12.1* 13.7*  NEUTROABS 11.9*  --   --   HGB 12.6* 11.3* 11.1*  HCT 37.7* 33.4* 34.4*  MCV 89.1 89.1 89.6  PLT 214 172 180   Cardiac Enzymes:  Recent Labs Lab 02/21/15 0430  TROPONINI <0.03   BNP (last 3 results)  Recent Labs  08/20/14 2155  BNP 49.4    ProBNP (last 3 results)  Recent Labs  12/31/14 0938  PROBNP 43.0    CBG: No results for input(s): GLUCAP in the last 168 hours.  Recent Results (from the past 240 hour(s))  Culture, Urine     Status: None   Collection Time: 02/21/15  1:22 PM  Result Value Ref Range Status   Specimen Description URINE, RANDOM  Final   Special Requests NONE  Final   Culture MULTIPLE SPECIES PRESENT, SUGGEST  RECOLLECTION  Final   Report Status 02/22/2015 FINAL  Final     Studies: No results found.  Scheduled Meds: . budesonide-formoterol  2 puff Inhalation BID  . cefTRIAXone (ROCEPHIN)  IV  1 g Intravenous Q24H  . escitalopram  20 mg Oral Daily  . feeding supplement (ENSURE ENLIVE)  237 mL Oral BID BM  . fentaNYL  50 mcg Transdermal Q72H  . metoprolol succinate  25 mg Oral Daily  . midodrine  5 mg Oral TID WC  . simvastatin  10 mg Oral QPM  . warfarin  5 mg Oral Q T,Th,S,Su-1800  . warfarin  7.5 mg Oral Q M,W,F-1800  . Warfarin - Pharmacist Dosing Inpatient   Does not apply q1800   Continuous Infusions:  Antibiotics Given (last 72 hours)    Date/Time Action Medication Dose Rate   02/22/15 0814 Given   cefTRIAXone (ROCEPHIN) 1 g in dextrose 5 % 50 mL IVPB 1 g 100 mL/hr   02/23/15 1002 Given   cefTRIAXone (ROCEPHIN) 1 g in dextrose 5 % 50 mL IVPB 1 g 100 mL/hr   02/24/15 0813 Given   cefTRIAXone (ROCEPHIN) 1 g in dextrose 5 % 50 mL IVPB 1 g 100 mL/hr      Principal Problem:   Lumbar compression fracture Active Problems:   Coronary atherosclerosis   HYPOTENSION, ORTHOSTATIC   History of cardiovascular disorder   PACEMAKER, PERMANENT   A-fib   Hx of bladder cancer   Dyslipidemia   COPD (chronic obstructive pulmonary disease)   Chronic respiratory failure with hypoxia   IPF (idiopathic pulmonary fibrosis)   Compression fracture   UTI (lower urinary tract infection)   Diarrhea   Palliative care encounter  Time spent: 25 min  [place] [email or web address], password [facility] 02/24/2015, 11:52 AM  LOS: 2 days

## 2015-02-24 NOTE — Progress Notes (Signed)
ANTICOAGULATION CONSULT NOTE - Follow Up Consult  Pharmacy Consult for Coumadin Indication: atrial fibrillation  Allergies  Allergen Reactions  . Codeine Nausea And Vomiting    Patient Measurements: Height: 5\' 8"  (172.7 cm) Weight: 160 lb (72.576 kg) IBW/kg (Calculated) : 68.4  Vital Signs: Temp: 97.9 F (36.6 C) (09/22 0927) Temp Source: Oral (09/22 0927) BP: 118/64 mmHg (09/22 0927) Pulse Rate: 64 (09/22 0927)  Labs:  Recent Labs  02/22/15 0629 02/23/15 0614 02/24/15 0420  HGB 11.3*  --  11.1*  HCT 33.4*  --  34.4*  PLT 172  --  180  LABPROT 30.4* 24.3* 25.3*  INR 2.98* 2.20* 2.33*  CREATININE 0.90  --  0.86    Estimated Creatinine Clearance: 61.9 mL/min (by C-G formula based on Cr of 0.86).   PMH: HLD, CAD, HTN, CVA, AFIB, GERD, HA, Arthritis, Osteoporosis, Depression, AAA, Pacemaker, Cervical/Spinal disease(surgery), Prostate CA., HOH, Bladder CA, Urostomy, Anxiety, COPD   Assessment:  81 yo male who presents after a fall with complaints of back pain. He has multiple medical problems including A.fib on Warfarin therapy. Pharmacy is dosing coumadin, INR today= 2.33 and at goal.   HOME Dose: Warfarin 7.5mg  MWF and 5mg  TTHSS with admit INR 3.02.  Goal of Therapy:  INR 2-3 Monitor platelets by anticoagulation protocol: Yes   Plan:  -Continue coumadin at home dose  -PT/INR MWF  Hildred Laser, Pharm D 02/24/2015 10:50 AM

## 2015-02-24 NOTE — Clinical Social Work Placement (Signed)
   CLINICAL SOCIAL WORK PLACEMENT  NOTE  Date:  02/24/2015  Patient Details  Name: Peter Richards MRN: [id number] Date of Birth: 09/09/1928  Clinical Social Work is seeking post-discharge placement for this patient at the [place] level of care (*CSW will initial, date and re-position this form in  chart as items are completed):  Yes   Patient/family provided with [facility] Work Department's list of facilities offering this level of care within the geographic area requested by the patient (or if unable, by the patient's family).  Yes   Patient/family informed of their freedom to choose among providers that offer the needed level of care, that participate in Medicare, Medicaid or managed care program needed by the patient, have an available bed and are willing to accept the patient.  Yes   Patient/family informed of [facility]'s ownership interest in [facility] and [facility], as well as of the fact that they are under no obligation to receive care at these facilities.  PASRR submitted to EDS on       PASRR number received on       Existing PASRR number confirmed on 02/24/15     FL2 transmitted to all facilities in geographic area requested by pt/family on 02/24/15     FL2 transmitted to all facilities within larger geographic area on       Patient informed that his/her managed care company has contracts with or will negotiate with certain facilities, including the following:            Patient/family informed of bed offers received.  Patient chooses bed at       Physician recommends and patient chooses bed at      Patient to be transferred to   on  .  Patient to be transferred to facility by       Patient family notified on   of transfer.  Name of family member notified:        PHYSICIAN Please sign FL2, Please sign DNR     Additional Comment:    _______________________________________________ Glendon Axe, MSW,  LCSWA [phone] 02/24/2015 11:16 AM

## 2015-02-25 ENCOUNTER — Inpatient Hospital Stay (HOSPITAL_COMMUNITY): Payer: Commercial Managed Care - HMO

## 2015-02-25 DIAGNOSIS — T148 Other injury of unspecified body region: Secondary | ICD-10-CM | POA: Diagnosis not present

## 2015-02-25 DIAGNOSIS — Z515 Encounter for palliative care: Secondary | ICD-10-CM | POA: Diagnosis not present

## 2015-02-25 DIAGNOSIS — S22060S Wedge compression fracture of T7-T8 vertebra, sequela: Secondary | ICD-10-CM | POA: Diagnosis not present

## 2015-02-25 DIAGNOSIS — S32000A Wedge compression fracture of unspecified lumbar vertebra, initial encounter for closed fracture: Secondary | ICD-10-CM | POA: Diagnosis not present

## 2015-02-25 DIAGNOSIS — S32000S Wedge compression fracture of unspecified lumbar vertebra, sequela: Secondary | ICD-10-CM | POA: Diagnosis not present

## 2015-02-25 LAB — PROTIME-INR
INR: 2.46 — ABNORMAL HIGH (ref 0.00–1.49)
Prothrombin Time: 26.3 seconds — ABNORMAL HIGH (ref 11.6–15.2)

## 2015-02-25 MED ORDER — MORPHINE SULFATE 15 MG PO TABS
15.0000 mg | ORAL_TABLET | ORAL | Status: DC | PRN
  Administered 2015-02-25 – 2015-03-02 (×13): 15 mg via ORAL
  Filled 2015-02-25 (×14): qty 1

## 2015-02-25 MED ORDER — CALCITONIN (SALMON) 200 UNIT/ACT NA SOLN
1.0000 | Freq: Every day | NASAL | Status: DC
  Administered 2015-02-25 – 2015-03-02 (×6): 1 via NASAL
  Filled 2015-02-25: qty 3.7

## 2015-02-25 MED ORDER — BUDESONIDE 0.25 MG/2ML IN SUSP
0.2500 mg | Freq: Two times a day (BID) | RESPIRATORY_TRACT | Status: DC
  Administered 2015-02-25 – 2015-03-02 (×9): 0.25 mg via RESPIRATORY_TRACT
  Filled 2015-02-25 (×11): qty 2

## 2015-02-25 MED ORDER — PREGABALIN 25 MG PO CAPS
25.0000 mg | ORAL_CAPSULE | Freq: Every day | ORAL | Status: DC
  Administered 2015-02-25 – 2015-03-01 (×5): 25 mg via ORAL
  Filled 2015-02-25 (×5): qty 1

## 2015-02-25 MED ORDER — DICLOFENAC SODIUM 1 % TD GEL
4.0000 g | Freq: Four times a day (QID) | TRANSDERMAL | Status: DC
  Administered 2015-02-25 – 2015-03-02 (×20): 4 g via TOPICAL
  Filled 2015-02-25: qty 100

## 2015-02-25 MED ORDER — ACETAMINOPHEN 500 MG PO TABS
1000.0000 mg | ORAL_TABLET | Freq: Three times a day (TID) | ORAL | Status: DC
  Administered 2015-02-25 – 2015-03-02 (×15): 1000 mg via ORAL
  Filled 2015-02-25 (×14): qty 2

## 2015-02-25 MED ORDER — SENNA 8.6 MG PO TABS
2.0000 | ORAL_TABLET | Freq: Every day | ORAL | Status: DC
  Administered 2015-02-25 – 2015-03-01 (×5): 17.2 mg via ORAL
  Filled 2015-02-25 (×5): qty 2

## 2015-02-25 MED ORDER — IPRATROPIUM-ALBUTEROL 0.5-2.5 (3) MG/3ML IN SOLN
3.0000 mL | Freq: Four times a day (QID) | RESPIRATORY_TRACT | Status: DC
  Administered 2015-02-25 – 2015-02-26 (×4): 3 mL via RESPIRATORY_TRACT
  Filled 2015-02-25 (×6): qty 3

## 2015-02-25 MED ORDER — BISACODYL 10 MG RE SUPP
10.0000 mg | Freq: Once | RECTAL | Status: AC
  Administered 2015-02-25: 10 mg via RECTAL
  Filled 2015-02-25: qty 1

## 2015-02-25 NOTE — Clinical Social Work Note (Signed)
Patient has a SNF bed at [facility] at [place]. [employer] to negotiate contract with [name].   CSW remains available as needed.   Glendon Axe, MSW, LCSWA [phone] 02/25/2015 10:30 AM

## 2015-02-25 NOTE — Progress Notes (Signed)
Physical Therapy Treatment Patient Details Name: Peter Richards MRN: [id number] DOB: 1928/10/04 Today's Date: 02/25/2015    History of Present Illness Pt is an 81 y/o male with PMH of A Fib on coumadin, orthostatic hypotension, CVA, MI, pacemaker, AAA, depression, COPD, C4-5 and C5-6 fusion, L4-5 fusion, prostate cancer, bladder cancer.  Pt with recent fall and back pain.  Imaging revealed acute mild T7 compression fracture and chronic L2 and L3 mild compression fractures. Treatment is nonsurgical management at this point.    PT Comments    Slightly better tolerance with therapy today. Able to stand and sidestep along bed. Focused on quality of movement and posture, keeping an upright posture to prevent further compressive forces on vertebrae. Able to perform LE exercises and motivated to improve his function. Encouraged to get OOB with staff this weekend, as much as tolerated, to prevent further weakening.  Follow Up Recommendations  Supervision/Assistance - 24 hour;SNF     Equipment Recommendations  None recommended by PT    Recommendations for Other Services       Precautions / Restrictions Precautions Precautions: Fall Precaution Comments: RLQ urostomy Restrictions Weight Bearing Restrictions: No    Mobility  Bed Mobility Overal bed mobility: Needs Assistance Bed Mobility: Rolling;Sidelying to Sit;Sit to Supine Rolling: Mod assist Sidelying to sit: Mod assist     Sit to sidelying: Mod assist General bed mobility comments: Mod assist for truncal support. Reviewed log roll technique to maintain neutral spine alignment. Required assist for LEs back into bed. Cues for technique throughout.  Transfers Overall transfer level: Needs assistance Equipment used: Rolling walker (2 wheeled) Transfers: Sit to/from Stand;Lateral/Scoot Transfers Sit to Stand: Mod assist        Lateral/Scoot Transfers: Min assist General transfer comment: Able to squat and scoot up in bed with  cues to maintain upright posture and min assist for boost. Tolerated sit<> stand with mod assist for boost to rise. Leans heavily to posterior.   Ambulation/Gait Ambulation/Gait assistance: Mod assist Ambulation Distance (Feet): 2 Feet Assistive device: Rolling walker (2 wheeled) Gait Pattern/deviations:  (side step)     General Gait Details: Took several small side steps to head of bed but unwilling to ambulate forward. Mod assist for balance due to posterior lean. Max VC and tactile cues for anterior weight-shift and sequencing. Needs assist for walker control. VC for upright posture and pt reports cervical pain improves in this position.   Stairs            Wheelchair Mobility    Modified Rankin (Stroke Patients Only)       Balance                                    Cognition Arousal/Alertness: Awake/alert Behavior During Therapy: Flat affect Overall Cognitive Status: Within Functional Limits for tasks assessed                      Exercises General Exercises - Lower Extremity Ankle Circles/Pumps: Both;10 reps;AROM;Supine Gluteal Sets: Strengthening;Both;10 reps;Supine Short Arc Quad: Strengthening;Both;10 reps;Supine    General Comments General comments (skin integrity, edema, etc.): Discussed importance of posture based on fracture location. Pt felt much better when placed upright in bed in near-chair position with back and neck supported in neutral postions.. Encouraged to get OOB frequently with staff this weekend.      Pertinent Vitals/Pain Pain Assessment: 0-10 Pain Score:  ("bad  when I move" no value given) Pain Location: back and neck Pain Descriptors / Indicators: Aching;Guarding;Grimacing Pain Intervention(s): Monitored during session;Repositioned;Limited activity within patient's tolerance    Home Living                      Prior Function            PT Goals (current goals can now be found in the care plan  section) Acute Rehab PT Goals Patient Stated Goal: get better PT Goal Formulation: With patient Time For Goal Achievement: 03/08/15 Potential to Achieve Goals: Fair Progress towards PT goals: Progressing toward goals    Frequency  Min 5X/week    PT Plan Current plan remains appropriate    Co-evaluation             End of Session Equipment Utilized During Treatment: Gait belt;Oxygen Activity Tolerance: Patient limited by pain Patient left: in bed;with call bell/phone within reach;with bed alarm set     Time: 9774-1423 PT Time Calculation (min) (ACUTE ONLY): 19 min  Charges:  $Therapeutic Activity: 8-22 mins                    G Codes:      Ellouise Newer 03/23/2015, 6:02 PM Camille Bal [place], [place]

## 2015-02-25 NOTE — Progress Notes (Signed)
OT Cancellation Note  Patient Details Name: Peter Richards MRN: [id number] DOB: 04-Jul-1928   Cancelled Treatment:    Reason Eval/Treat Not Completed: Patient at procedure or test/ unavailable. Pt down for CT.  Almon Register [phone] 02/25/2015, 3:09 PM

## 2015-02-25 NOTE — Progress Notes (Signed)
PROGRESS NOTE  Peter Richards VZD:[id number] DOB: February 14, 1929 DOA: 02/21/2015 PCP: Mathews Argyle, MD  Assessment/Plan: Respiratory distress: Resolved. CXR unchanged. BNP 300  Acute lumbar compression fracture Discussed with Dr. Hilma Favors. She had a goals of care meeting with patient and family. They would like to continue aggressive care and if he can get out of pain, he would not want to be comfort care. She has consultative interventional radiology to consider vertebroplasty.  Orthostatic hypotension -history of orthostatic hypotension per [name]. -ted hose  Urinary tract infection Urine culture with multiple bacterial morphotypes. Will change Rocephin to Ceftin.  Diarrhea 2 days resolved  Atrial fibrillation On metoprolol and Coumadin. Currently in sinus rhythm, and rate controlled. Coumadin managed per pharmacy.  Coronary artery disease status post pacemaker placement and CABG Stable. Not currently having chest pain. On simvastatin and Coumadin therapy, nitroglycerin when necessary chest pain.  Idiopathic pulmonary fibrosis with COPD Chronically on 2 L of oxygen at home. Will continue home medications including DuoNeb's, Spiriva Symbicort and albuterol.  Diastolic Dysfunction Grade 1 DD in 2015 noted on Echo. -2-D echo now similar See above  Code Status: DNR Family Communication: son and wife Disposition Plan: SNF once decision about [facility] made   Consultants:  PT  ortho- phone via ER  PMT  IR  Procedures:     HPI/Subjective: No new complaints. Per family, more lucid today.   Objective: Filed Vitals:   02/25/15 1001  BP: 128/59  Pulse: 75  Temp: 98.2 F (36.8 C)  Resp: 16    Intake/Output Summary (Last 24 hours) at 02/25/15 1148 Last data filed at 02/25/15 1044  Gross per 24 hour  Intake    720 ml  Output   1350 ml  Net   -630 ml   Filed Weights   02/21/15 0407 02/25/15 0500  Weight: 72.576 kg (160 lb) 76.159 kg (167 lb 14.4  oz)    Exam:   General:  Asleep. Arousable. Appears comfortable. Cooperative, but slightly confused.  Cardiovascular: irr  Respiratory: clear to auscultation bilaterally without wheezes rhonchi or rales. Breathing nonlabored.  Abdomen: +BS, soft, nontender  Musculoskeletal: no edema   Data Reviewed: Basic Metabolic Panel:  Recent Labs Lab 02/21/15 0430 02/22/15 0629 02/24/15 0420  NA 137 136 136  K 3.9 4.1 4.6  CL 102 107 102  CO2 26 23 30   GLUCOSE 100* 98 103*  BUN 22* 15 16  CREATININE 1.10 0.90 0.86  CALCIUM 9.1 8.3* 9.0   Liver Function Tests: No results for input(s): AST, ALT, ALKPHOS, BILITOT, PROT, ALBUMIN in the last 168 hours. No results for input(s): LIPASE, AMYLASE in the last 168 hours. No results for input(s): AMMONIA in the last 168 hours. CBC:  Recent Labs Lab 02/21/15 0430 02/22/15 0629 02/24/15 0420  WBC 15.7* 12.1* 13.7*  NEUTROABS 11.9*  --   --   HGB 12.6* 11.3* 11.1*  HCT 37.7* 33.4* 34.4*  MCV 89.1 89.1 89.6  PLT 214 172 180   Cardiac Enzymes:  Recent Labs Lab 02/21/15 0430  TROPONINI <0.03   BNP (last 3 results)  Recent Labs  08/20/14 2155 02/24/15 1302  BNP 49.4 300.4*    ProBNP (last 3 results)  Recent Labs  12/31/14 0938  PROBNP 43.0    CBG: No results for input(s): GLUCAP in the last 168 hours.  Recent Results (from the past 240 hour(s))  Culture, Urine     Status: None   Collection Time: 02/21/15  1:22 PM  Result Value Ref  Range Status   Specimen Description URINE, RANDOM  Final   Special Requests NONE  Final   Culture MULTIPLE SPECIES PRESENT, SUGGEST RECOLLECTION  Final   Report Status 02/22/2015 FINAL  Final     Studies: Dg Chest Port 1 View  02/24/2015   CLINICAL DATA:  Respiratory distress  EXAM: PORTABLE CHEST 1 VIEW  COMPARISON:  CT 01/07/2015  FINDINGS: LEFT-sided pacemaker overlies stable cardiac silhouette. There is chronic interstitial lung disease again noted most severe in the LEFT lower  lobe and RIGHT upper lobe. No clear superimposed infection or edema.  IMPRESSION: Chronic interstitial lung disease similar to prior. Difficult to exclude superimposed infection or edema but no clear acute process identified.   Electronically Signed   By: Suzy Bouchard M.D.   On: 02/24/2015 14:13    Scheduled Meds: . acetaminophen  1,000 mg Oral TID  . bisacodyl  10 mg Rectal Once  . budesonide-formoterol  2 puff Inhalation BID  . calcitonin (salmon)  1 spray Alternating Nares Daily  . cefUROXime  500 mg Oral BID WC  . diclofenac sodium  4 g Topical QID  . escitalopram  20 mg Oral Daily  . feeding supplement (ENSURE ENLIVE)  237 mL Oral BID BM  . fentaNYL  50 mcg Transdermal Q72H  . furosemide  20 mg Oral Daily  . lidocaine  1 patch Transdermal Q24H  . metoprolol succinate  25 mg Oral Daily  . midodrine  5 mg Oral TID WC  . senna  2 tablet Oral QHS  . simvastatin  10 mg Oral QPM  . warfarin  5 mg Oral Q T,Th,S,Su-1800  . warfarin  7.5 mg Oral Q M,W,F-1800  . Warfarin - Pharmacist Dosing Inpatient   Does not apply q1800   Continuous Infusions:  Antibiotics Given (last 72 hours)    Date/Time Action Medication Dose Rate   02/23/15 1002 Given   cefTRIAXone (ROCEPHIN) 1 g in dextrose 5 % 50 mL IVPB 1 g 100 mL/hr   02/24/15 0813 Given   cefTRIAXone (ROCEPHIN) 1 g in dextrose 5 % 50 mL IVPB 1 g 100 mL/hr   02/25/15 0855 Given   cefUROXime (CEFTIN) tablet 500 mg 500 mg       Principal Problem:   Lumbar compression fracture Active Problems:   Coronary atherosclerosis   HYPOTENSION, ORTHOSTATIC   History of cardiovascular disorder   PACEMAKER, PERMANENT   A-fib   Hx of bladder cancer   Dyslipidemia   COPD (chronic obstructive pulmonary disease)   Chronic respiratory failure with hypoxia   IPF (idiopathic pulmonary fibrosis)   Compression fracture   UTI (lower urinary tract infection)   Diarrhea   Palliative care encounter   Respiratory distress   Back pain   Traumatic  compression fracture of T7 thoracic vertebra  Time spent: 25 min  [facility] [email or web address], password [facility] 02/25/2015, 11:48 AM  LOS: 3 days

## 2015-02-25 NOTE — Progress Notes (Addendum)
Daily Progress Note   Patient Name: Peter Richards       Date: 02/25/2015 DOB: September 09, 1928  Age: 81 y.o. MRN#: [id number] Attending Physician: Delfina Redwood, MD Primary Care Physician: Mathews Argyle, MD Admit Date: 02/21/2015  Reason for Consultation/Follow-up: Establishing goals of care, Non pain symptom management and Pain control  Subjective: Mental status is much improved today but unfortunately his pain is not- family feel like he was over medicated yesterday. He has full capacity on my evaluation- good insight into his own condition. He continues to have severe pain, and reports very real suffering. He is hopeful for relief and open to vertebroplasty (has had this in the past with good results) - he does say that if this pain continues he would want comfort as his main goal- his son is not ready to hear that and wants anything possible to help his dad - we talked about finding the "sweet spot" where his dad's pain is controlled- but a place where he is also not overly sedated- we may or may not be able to achieve this. Interval Events: Admitted 9/19 Length of Stay: 3 days  Current Medications: Scheduled Meds:  . budesonide-formoterol  2 puff Inhalation BID  . cefUROXime  500 mg Oral BID WC  . escitalopram  20 mg Oral Daily  . feeding supplement (ENSURE ENLIVE)  237 mL Oral BID BM  . fentaNYL  50 mcg Transdermal Q72H  . furosemide  20 mg Oral Daily  . lidocaine  1 patch Transdermal Q24H  . metoprolol succinate  25 mg Oral Daily  . midodrine  5 mg Oral TID WC  . simvastatin  10 mg Oral QPM  . warfarin  5 mg Oral Q T,Th,S,Su-1800  . warfarin  7.5 mg Oral Q M,W,F-1800  . Warfarin - Pharmacist Dosing Inpatient   Does not apply q1800    Continuous Infusions:    PRN Meds: acetaminophen **OR** acetaminophen, albuterol, ALPRAZolam, alum & mag hydroxide-simeth, diclofenac sodium, fentaNYL (SUBLIMAZE) injection, ipratropium-albuterol, nitroGLYCERIN, ondansetron **OR**  ondansetron (ZOFRAN) IV, oxyCODONE, zolpidem  Palliative Performance Scale: 50%     Vital Signs: BP 128/59 mmHg  Pulse 75  Temp(Src) 98.2 F (36.8 C) (Oral)  Resp 16  Ht 5\' 8"  (1.727 m)  Wt 76.159 kg (167 lb 14.4 oz)  BMI 25.54 kg/m2  SpO2 95% SpO2: SpO2: 95 % O2 Device: O2 Device: Nasal Cannula O2 Flow Rate: O2 Flow Rate (L/min): 2 L/min  Intake/output summary:  Intake/Output Summary (Last 24 hours) at 02/25/15 1125 Last data filed at 02/25/15 1044  Gross per 24 hour  Intake    720 ml  Output   1350 ml  Net   -630 ml   LBM:   Baseline Weight: Weight: 72.576 kg (160 lb) Most recent weight: Weight: 76.159 kg (167 lb 14.4 oz)  Physical Exam: Alert, awake, answers questions approprioately              Additional Data Reviewed: Recent Labs     02/24/15  0420  WBC  13.7*  HGB  11.1*  PLT  180  NA  136  BUN  16  CREATININE  0.86     Problem List:  Patient Active Problem List   Diagnosis Date Noted  . Respiratory distress   . Back pain   . Traumatic compression fracture of T7 thoracic vertebra   . Palliative care encounter   . Compression fracture 02/21/2015  . UTI (lower urinary tract infection) 02/21/2015  . Diarrhea  02/21/2015  . IPF (idiopathic pulmonary fibrosis) 01/12/2015  . Anxiety state 01/12/2015  . Chronic respiratory failure with hypoxia 12/30/2014  . Atypical chest pain 12/30/2014  . Encounter for therapeutic drug monitoring 09/13/2014  . Acute and chronic respiratory failure with hypoxia 08/21/2014  . Low blood pressure 06/30/2014  . Pain in limb 11/09/2013  . Sepsis 10/03/2013  . Right carotid bruit 07/07/2013  . COPD (chronic obstructive pulmonary disease) 06/05/2012  . Interstitial lung disease 01/22/2012  . Lumbar compression fracture 08/24/2011  . Dyslipidemia 05/02/2011  . Low back pain 05/02/2011  . Hx of bladder cancer 01/01/2011  . A-fib 11/15/2010  . Atrial fibrillation 08/25/2010  . HYPERLIPIDEMIA 11/18/2009  . Coronary  atherosclerosis 11/18/2009  . BRADYCARDIA 11/18/2009  . HYPOTENSION, ORTHOSTATIC 11/18/2009  . SYNCOPE 11/18/2009  . CHEST PAIN 11/18/2009  . History of cardiovascular disorder 11/18/2009  . ATRIAL FIBRILLATION, HX OF 11/18/2009  . PACEMAKER, PERMANENT 11/18/2009     Palliative Care Assessment & Plan    Code Status:  DNR  Goals of Care:  Family recognize this may be the beginning of a rapid decline- but prior to this hospitalization his functional status was good and he was living in independent living at [facility] with his wife who has dementia.  They want all possible treatment options explored that could help improve his QOL and preserve his function if this is possible.  The patient himself has articulated that his pain is so bad that he thinks about dying- I am not sure we have exhausted our options with his pain control   Family and patient agreeable to 1. Adjusting opiates 2. IR eval for Kyphoplasty 3. Calcitonin  Plan is for him to return to the skilled area at [facility] for rehab  I discussed with them a plan should things get worse- should his immobility lead to skin issues and aspiration PNA and delirium.   Symptom Management:  Pain (severe Neck Pain, Mid Back pain):  Complex pain issues: Acute on Chronic pain, he holds his chest at the level of T7-I believe this is where the majority of his pain is coming from- he seems to be tolerating the Increase in his Duragesic Patch very well this morning- I recommend simplifying his pain regimen as much as possible to avoid cross tolerance and side effects.  Start Calcitonin for acute Lumbar compression fracture pain  Eval for Kyphoplasty  This will be complicated by his coumadin- once IR approves him as a candidate for intervention will need to appropriate transition his anticoagulation  Added Topical NSAID- Voltaren Gel  Use Heat Topical  If symptoms still poorly controlled will add on a small dose of  Lyrica QHS.  For patients with incapacitating pain from acute and subacute vertebral compression fractures who are unable to taper parenteral or transition to oral opioids within seven days of admission or have intolerable sedation, constipation, or delirium from this therapy, we suggest vertebral augmentation rather than continued medical management . This is typically performed during the initial hospitalization.   Constipation: No BM since admission, I discontinued enteric precautions- the admission history was loose stool- he told me today that was because he took a bunch of laxatives for constipation at home and they all "hit him at once".   Cough/Dyspnea: Patient cannot coordinate deep breathing due to splinting from his injury- will change his inhalers to nebulized medications. Need incentive spirometry.   Palliative Prophylaxis:  Delirium Precautions  Aspiration Precautions  Bowel regimen  Fall risk  Incentive spirometer   Psycho-social/Spiritual:  Desire for further Chaplaincy support:no   Prognosis: Unable to determine Discharge Planning: [facility] for rehab with Palliative care service follow-up   Care plan was discussed with Dr. Conley Canal, Family, Patient.  Thank you for allowing the Palliative Medicine Team to assist in the care of this patient.   Time In: 10:45 Time Out: 11 55 Total Time 70 minutes Prolonged Time Billed Yes    Greater than 50%  of this time was spent counseling and coordinating care related to the above assessment and plan.   Acquanetta Chain, DO  02/25/2015, 11:25 AM  Please contact Palliative Medicine Team phone at [phone] for questions and concerns.

## 2015-02-25 NOTE — Care Management Important Message (Signed)
Important Message  Patient Details  Name: Peter Richards MRN: [id number] Date of Birth: [birth year]   Medicare Important Message Given:  Yes-second notification given    Loann Quill 02/25/2015, 11:09 AM

## 2015-02-25 NOTE — Progress Notes (Signed)
Patient ID: Peter Richards, male   DOB: [birth year], 81 y.o.   MRN: [id number]   Pt with back pain T7 fx on plain film  Request for eval for [facility] has been reviewed by Dr Estanislado Pandy Rec: CT T spine   INR 2.46 afeb  Will await CT result And place new note when reviewed  Will need INR 1.5 or lower

## 2015-02-26 DIAGNOSIS — S32000A Wedge compression fracture of unspecified lumbar vertebra, initial encounter for closed fracture: Secondary | ICD-10-CM | POA: Diagnosis not present

## 2015-02-26 DIAGNOSIS — M542 Cervicalgia: Secondary | ICD-10-CM | POA: Diagnosis not present

## 2015-02-26 DIAGNOSIS — I4891 Unspecified atrial fibrillation: Secondary | ICD-10-CM | POA: Diagnosis not present

## 2015-02-26 DIAGNOSIS — Z515 Encounter for palliative care: Secondary | ICD-10-CM | POA: Diagnosis not present

## 2015-02-26 MED ORDER — IPRATROPIUM-ALBUTEROL 0.5-2.5 (3) MG/3ML IN SOLN
3.0000 mL | Freq: Three times a day (TID) | RESPIRATORY_TRACT | Status: DC
  Administered 2015-02-27 – 2015-03-02 (×9): 3 mL via RESPIRATORY_TRACT
  Filled 2015-02-26 (×13): qty 3

## 2015-02-26 MED ORDER — ZOLPIDEM TARTRATE 5 MG PO TABS
5.0000 mg | ORAL_TABLET | Freq: Every evening | ORAL | Status: DC | PRN
  Administered 2015-02-26 – 2015-03-01 (×4): 5 mg via ORAL
  Filled 2015-02-26 (×4): qty 1

## 2015-02-26 MED ORDER — METHOCARBAMOL 500 MG PO TABS
500.0000 mg | ORAL_TABLET | Freq: Three times a day (TID) | ORAL | Status: DC
  Administered 2015-02-26 – 2015-03-02 (×13): 500 mg via ORAL
  Filled 2015-02-26 (×14): qty 1

## 2015-02-26 NOTE — Progress Notes (Signed)
Patient requesting for ambien for sleep. Takes 5mg  ambien at home every H.S. Order given for [facility] by Zamier Eggebrecht.

## 2015-02-26 NOTE — Progress Notes (Signed)
Daily Progress Note   Patient Name: Peter Richards       Date: 02/26/2015 DOB: [birth year]  Age: 81 y.o. MRN#: [id number] Attending Physician: Delfina Redwood, MD Primary Care Physician: Mathews Argyle, MD Admit Date: 02/21/2015  Reason for Consultation/Follow-up: Non pain symptom management, Pain control and Psychosocial/spiritual support  Subjective: Pt continues to c/o back pain with no relief. He does state that his thoracic area might be " a little better" but today his cervical area he is rating at " at least a 10/10". He cannot turn his head it hurts so badly. He also reports he only slept 2 hours last night because of the pain. He has received 3 PRN MSIR 15 mg PRN's since midnight. He states " they don't help". He is not sedated. Answering questions appropriately. Per chart review and discussion with attending, plan is for pt to be seen by IR for possibly KP for new comp fx. Review of CT cervical and lumbar area shows hardware in place. Pt is verbalizing depression because of the pain. He was a 1-person assist to recliner this am. Pt states before the fall he was managing "pretty well" He has had chronic pain since his 20's . He cannot tell me anything that helps. In conversation with Dr. Conley Canal, pt was confused as well as developed mild rhonchi with fentanyl TD escalation the other day. He is not confused today, and no rhonchi . I'd tried to encourage pt that this is an acute event and will get better but that we also have to give medication a chance to work. Interval Events: Medication changes Length of Stay: 4 days  Current Medications: Scheduled Meds:  . acetaminophen  1,000 mg Oral TID  . budesonide (PULMICORT) nebulizer solution  0.25 mg Nebulization BID  . calcitonin (salmon)  1 spray Alternating Nares Daily  . cefUROXime  500 mg Oral BID WC  . diclofenac sodium  4 g Topical QID  . escitalopram  20 mg Oral Daily  . feeding supplement (ENSURE ENLIVE)  237 mL Oral  BID BM  . fentaNYL  50 mcg Transdermal Q72H  . furosemide  20 mg Oral Daily  . ipratropium-albuterol  3 mL Nebulization Q6H  . lidocaine  1 patch Transdermal Q24H  . methocarbamol  500 mg Oral TID  . metoprolol succinate  25 mg Oral Daily  . midodrine  5 mg Oral TID WC  . pregabalin  25 mg Oral QHS  . senna  2 tablet Oral QHS  . simvastatin  10 mg Oral QPM    Continuous Infusions:    PRN Meds: albuterol, alum & mag hydroxide-simeth, morphine, ondansetron **OR** ondansetron (ZOFRAN) IV  Palliative Performance Scale: 50%     Vital Signs: BP 152/63 mmHg  Pulse 84  Temp(Src) 98.2 F (36.8 C) (Oral)  Resp 16  Ht 5\' 8"  (1.727 m)  Wt 76.159 kg (167 lb 14.4 oz)  BMI 25.54 kg/m2  SpO2 92% SpO2: SpO2: 92 % O2 Device: O2 Device: Nasal Cannula O2 Flow Rate: O2 Flow Rate (L/min): 2 L/min  Intake/output summary:  Intake/Output Summary (Last 24 hours) at 02/26/15 1049 Last data filed at 02/26/15 0600  Gross per 24 hour  Intake      0 ml  Output   1150 ml  Net  -1150 ml   LBM:   Baseline Weight: Weight: 72.576 kg (160 lb) Most recent weight: Weight: 76.159 kg (167 lb 14.4 oz)  Physical Exam: General: Well nourished elderly man. He  appears to be in pain Resp: No work of breathing. Clear Psych: Thought processes organized. Mood depressed. No SI or HI. Affect constricted              Additional Data Reviewed: Recent Labs     02/24/15  0420  WBC  13.7*  HGB  11.1*  PLT  180  NA  136  BUN  16  CREATININE  0.86     Problem List:  Patient Active Problem List   Diagnosis Date Noted  . Respiratory distress   . Back pain   . Traumatic compression fracture of T7 thoracic vertebra   . Palliative care encounter   . Compression fracture 02/21/2015  . UTI (lower urinary tract infection) 02/21/2015  . Diarrhea 02/21/2015  . IPF (idiopathic pulmonary fibrosis) 01/12/2015  . Anxiety state 01/12/2015  . Chronic respiratory failure with hypoxia 12/30/2014  . Atypical chest  pain 12/30/2014  . Encounter for therapeutic drug monitoring 09/13/2014  . Acute and chronic respiratory failure with hypoxia 08/21/2014  . Low blood pressure 06/30/2014  . Pain in limb 11/09/2013  . Sepsis 10/03/2013  . Right carotid bruit 07/07/2013  . COPD (chronic obstructive pulmonary disease) 06/05/2012  . Interstitial lung disease 01/22/2012  . Lumbar compression fracture 08/24/2011  . Dyslipidemia 05/02/2011  . Low back pain 05/02/2011  . Hx of bladder cancer 01/01/2011  . A-fib 11/15/2010  . Atrial fibrillation 08/25/2010  . HYPERLIPIDEMIA 11/18/2009  . Coronary atherosclerosis 11/18/2009  . BRADYCARDIA 11/18/2009  . HYPOTENSION, ORTHOSTATIC 11/18/2009  . SYNCOPE 11/18/2009  . CHEST PAIN 11/18/2009  . History of cardiovascular disorder 11/18/2009  . ATRIAL FIBRILLATION, HX OF 11/18/2009  . PACEMAKER, PERMANENT 11/18/2009     Palliative Care Assessment & Plan    Code Status:  DNR  Goals of Care:  Cont with goal of improved pain management. 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  Symptom Management:  Pain: Cont current plan except will add robaxin 500 mg TID. Hopeful for KP on Mon. After evaluation and or procedure could consider adding steroid taper, further adjustment of fentanyl path by 12 mcg.   Depression: Consider change to Cymbalta for better control of depressive symptoms as well as SNRI could benefit chronic pain better than SSRI  Anxiety: Pt on xanax at home low dose of .25 prn. Monitor for need to resume  Palliative  Prophylaxis:  Bowel regimin  Psycho-social/Spiritual:  Desire for further Chaplaincy support:no   Prognosis: > 6 - 12 months Discharge Planning: Home with home health with PT support once pain is managed. Also Pain clinic with multidisciplinary team, and or visiting [facility] for cognitive behavioral support for chronic pain.   Care plan was discussed with Dr. Rowe Pavy and Dr. Conley Canal. Will cont to follow on 9/25  Thank you for allowing the Palliative Medicine Team to assist in the care of this patient.   Time In: 0900 Time Out: 0945 Total Time 45 min Prolonged Time Billed  no     Greater than 50%  of this time was spent counseling  and coordinating care related to the above assessment and plan.   Dory Horn, NP  02/26/2015, 10:49 AM  Please contact Palliative Medicine Team phone at [phone] for questions and concerns.

## 2015-02-26 NOTE — Progress Notes (Signed)
PROGRESS NOTE  RAJEEV ESCUE Richards:[id number] DOB: 02-23-1929 DOA: 02/21/2015 PCP: Mathews Argyle, MD  Assessment/Plan:  Acute lumbar compression fracture IR consulted for vertebroplasty. CT spine complete. Await recs. Hold coumadin.  PMT adjusting meds  Orthostatic hypotension -history of orthostatic hypotension per [name]. -ted hose  Urinary tract infection abx completed.  Diarrhea 2 days resolved  Atrial fibrillation On metoprolol and Coumadin. Currently in sinus rhythm, and rate controlled. Coumadin managed per pharmacy.  Coronary artery disease status post pacemaker placement and CABG Stable. Not currently having chest pain. On simvastatin and Coumadin therapy, nitroglycerin when necessary chest pain.  Idiopathic pulmonary fibrosis with COPD Chronically on 2 L of oxygen at home. Will continue home medications including DuoNeb's, Spiriva Symbicort and albuterol.  Diastolic Dysfunction Grade 1 DD in 2015 noted on Echo. -2-D echo now similar See above  Code Status: DNR Family Communication: son and wife Disposition Plan: SNF once decision about VP made   Consultants:  PT  ortho- phone via ER  PMT  IR  Procedures:     HPI/Subjective: Still with pain, but mainly in neck today.  Objective: Filed Vitals:   02/26/15 1321  BP: 125/65  Pulse: 79  Temp: 98.1 F (36.7 C)  Resp: 17    Intake/Output Summary (Last 24 hours) at 02/26/15 1500 Last data filed at 02/26/15 1419  Gross per 24 hour  Intake    240 ml  Output   1750 ml  Net  -1510 ml   Filed Weights   02/21/15 0407 02/25/15 0500  Weight: 72.576 kg (160 lb) 76.159 kg (167 lb 14.4 oz)    Exam:   General:  Alert, forgetful. Appropriate.  Cardiovascular: irr  Respiratory: clear to auscultation bilaterally without wheezes rhonchi or rales. Breathing nonlabored.  Abdomen: +BS, soft, nontender  Musculoskeletal: no edema   Data Reviewed: Basic Metabolic  Panel:  Recent Labs Lab 02/21/15 0430 02/22/15 0629 02/24/15 0420  NA 137 136 136  K 3.9 4.1 4.6  CL 102 107 102  CO2 26 23 30   GLUCOSE 100* 98 103*  BUN 22* 15 16  CREATININE 1.10 0.90 0.86  CALCIUM 9.1 8.3* 9.0   Liver Function Tests: No results for input(s): AST, ALT, ALKPHOS, BILITOT, PROT, ALBUMIN in the last 168 hours. No results for input(s): LIPASE, AMYLASE in the last 168 hours. No results for input(s): AMMONIA in the last 168 hours. CBC:  Recent Labs Lab 02/21/15 0430 02/22/15 0629 02/24/15 0420  WBC 15.7* 12.1* 13.7*  NEUTROABS 11.9*  --   --   HGB 12.6* 11.3* 11.1*  HCT 37.7* 33.4* 34.4*  MCV 89.1 89.1 89.6  PLT 214 172 180   Cardiac Enzymes:  Recent Labs Lab 02/21/15 0430  TROPONINI <0.03   BNP (last 3 results)  Recent Labs  08/20/14 2155 02/24/15 1302  BNP 49.4 300.4*    ProBNP (last 3 results)  Recent Labs  12/31/14 0938  PROBNP 43.0    CBG: No results for input(s): GLUCAP in the last 168 hours.  Recent Results (from the past 240 hour(s))  Culture, Urine     Status: None   Collection Time: 02/21/15  1:22 PM  Result Value Ref Range Status   Specimen Description URINE, RANDOM  Final   Special Requests NONE  Final   Culture MULTIPLE SPECIES PRESENT, SUGGEST RECOLLECTION  Final   Report Status 02/22/2015 FINAL  Final     Studies: Ct Cervical Spine Wo Contrast  02/25/2015   CLINICAL DATA:  Fall last  Friday. Neck pain. History of spinal fusion.  EXAM: CT CERVICAL SPINE WITHOUT CONTRAST  TECHNIQUE: Multidetector CT imaging of the cervical spine was performed without intravenous contrast. Multiplanar CT image reconstructions were also generated.  COMPARISON:  02/21/2015  FINDINGS: Changes of anterior fusion from C4-C6 with solid bony fusion. Degenerative disc disease throughout the cervical spine. Degenerative facet disease with grade 1 anterolisthesis of C7 on T1, stable. Prevertebral soft tissues are normal. No fracture. Diffuse  degenerative facet disease. No epidural or paraspinal hematoma.  IMPRESSION: Degenerative disc and facet disease. Grade 1 anterolisthesis at at C7-T1.  Solid bony fusion from C4-C6 with anterior fusion plate.  No acute bony abnormality or change.   Electronically Signed   By: Rolm Baptise M.D.   On: 02/25/2015 15:33   Ct Thoracic Spine Wo Contrast  02/25/2015   CLINICAL DATA:  Neck and back pain post fall 1 week ago. Previous spinal fusion surgery.  EXAM: CT THORACIC SPINE WITHOUT CONTRAST  TECHNIQUE: Multidetector CT imaging of the thoracic spine was performed without intravenous contrast administration. Multiplanar CT image reconstructions were also generated.  COMPARISON:  02/21/2015 and earlier studies  FINDINGS: There is a mild compression fracture deformity of T6 with subtle cortical contour abnormality at the anterior and posterior margins of the vertebral body which were not evident on prior CT chest of 01/07/2015. There is no significant retropulsion. Posterior elements intact. No additional acute bone abnormality. Additional nonacute findings:  C4-C6:  Anterior cervical fusion hardware  partially visualized.  C6-7:  moderate narrowing of the  interspace.  C7-T1: Asymmetric facet DJD allowing 4 mm anterolisthesis, as before.  T1 -3:  Moderate narrowing of interspaces.  T8-9: Vacuum phenomenon in the interspace.  Anterior osteophytes.  T9-T12: Anterior endplate osteophytes at all levels. Vacuum phenomenon T10-T12.  Extensive aortic calcifications. Left transvenous cardiac pacing leads are partially visualized.  IMPRESSION: 1. Mild compression fracture deformity of T6, new since 90/24/0973, without complicating features. 2. Degenerative and postop changes as above.   Electronically Signed   By: Lucrezia Europe M.D.   On: 02/25/2015 15:47   Ct Lumbar Spine Wo Contrast  02/25/2015   CLINICAL DATA:  Status post fall with back pain.  EXAM: CT LUMBAR SPINE WITHOUT CONTRAST  TECHNIQUE: Multidetector CT imaging of  the lumbar spine was performed without intravenous contrast administration. Multiplanar CT image reconstructions were also generated.  COMPARISON:  Spine radiographs dated 02/21/2015, CT of the abdomen dated 07/05/2014  FINDINGS: There is no evidence of fracture or subluxation of the lumbosacral spine. There has been a prior vertebroplasty of L2 vertebral body with stable approximately 30% height loss. Mild compression deformity of L3 vertebral bodies stable. There has been a prior L4-L5 laminectomy and posterior fusion, without evidence of hardware misalignment or other complications. There is mild levo convex lumbosacral spine scoliosis with associated multilevel moderate to severe osteoarthritic changes of the visualized spine.  Long segment of aneurysmal dilation of the aorta at the aorto-by-iliac bifurcation on the background of atherosclerotic disease is stable with maximum diameter of 3.1 cm.  IMPRESSION: No evidence of fracture subluxation of the lumbosacral spine.  Chronic findings of prior L4-L5 laminectomy and posterior fusion, and L2 vertebroplasty.  Multilevel osteoarthritic changes.  Known  distal abdominal aortic aneurysm, stable.   Electronically Signed   By: Fidela Salisbury M.D.   On: 02/25/2015 15:57    Scheduled Meds: . acetaminophen  1,000 mg Oral TID  . budesonide (PULMICORT) nebulizer solution  0.25 mg Nebulization BID  .  calcitonin (salmon)  1 spray Alternating Nares Daily  . cefUROXime  500 mg Oral BID WC  . diclofenac sodium  4 g Topical QID  . escitalopram  20 mg Oral Daily  . feeding supplement (ENSURE ENLIVE)  237 mL Oral BID BM  . fentaNYL  50 mcg Transdermal Q72H  . furosemide  20 mg Oral Daily  . ipratropium-albuterol  3 mL Nebulization Q6H  . lidocaine  1 patch Transdermal Q24H  . methocarbamol  500 mg Oral TID  . metoprolol succinate  25 mg Oral Daily  . midodrine  5 mg Oral TID WC  . pregabalin  25 mg Oral QHS  . senna  2 tablet Oral QHS  . simvastatin  10 mg  Oral QPM   Continuous Infusions:  Antibiotics Given (last 72 hours)    Date/Time Action Medication Dose Rate   02/24/15 0813 Given   cefTRIAXone (ROCEPHIN) 1 g in dextrose 5 % 50 mL IVPB 1 g 100 mL/hr   02/25/15 0855 Given   cefUROXime (CEFTIN) tablet 500 mg 500 mg    02/25/15 1615 Given   cefUROXime (CEFTIN) tablet 500 mg 500 mg    02/26/15 0913 Given   cefUROXime (CEFTIN) tablet 500 mg 500 mg       Principal Problem:   Lumbar compression fracture Active Problems:   Coronary atherosclerosis   HYPOTENSION, ORTHOSTATIC   History of cardiovascular disorder   PACEMAKER, PERMANENT   A-fib   Hx of bladder cancer   Dyslipidemia   COPD (chronic obstructive pulmonary disease)   Chronic respiratory failure with hypoxia   IPF (idiopathic pulmonary fibrosis)   Compression fracture   UTI (lower urinary tract infection)   Diarrhea   Palliative care encounter   Respiratory distress   Back pain   Traumatic compression fracture of T7 thoracic vertebra   Cervical pain (neck)  Time spent: 25 min  [facility] [email or web address], password [facility] 02/26/2015, 3:00 PM  LOS: 4 days

## 2015-02-27 DIAGNOSIS — I482 Chronic atrial fibrillation: Secondary | ICD-10-CM | POA: Diagnosis not present

## 2015-02-27 DIAGNOSIS — S32000S Wedge compression fracture of unspecified lumbar vertebra, sequela: Secondary | ICD-10-CM | POA: Diagnosis not present

## 2015-02-27 DIAGNOSIS — Z515 Encounter for palliative care: Secondary | ICD-10-CM | POA: Diagnosis not present

## 2015-02-27 DIAGNOSIS — I951 Orthostatic hypotension: Secondary | ICD-10-CM | POA: Diagnosis not present

## 2015-02-27 LAB — PROTIME-INR
INR: 2.26 — AB (ref 0.00–1.49)
PROTHROMBIN TIME: 24.7 s — AB (ref 11.6–15.2)

## 2015-02-27 MED ORDER — PHYTONADIONE 5 MG PO TABS
2.5000 mg | ORAL_TABLET | Freq: Once | ORAL | Status: AC
  Administered 2015-02-27: 2.5 mg via ORAL
  Filled 2015-02-27 (×2): qty 1

## 2015-02-27 MED ORDER — FENTANYL 50 MCG/HR TD PT72
62.5000 ug | MEDICATED_PATCH | TRANSDERMAL | Status: DC
  Administered 2015-02-27 – 2015-03-02 (×2): 62.5 ug via TRANSDERMAL
  Filled 2015-02-27 (×4): qty 1

## 2015-02-27 MED ORDER — BISACODYL 10 MG RE SUPP
10.0000 mg | Freq: Every day | RECTAL | Status: DC | PRN
  Administered 2015-02-27 – 2015-02-28 (×2): 10 mg via RECTAL
  Filled 2015-02-27 (×2): qty 1

## 2015-02-27 NOTE — Progress Notes (Signed)
IR following for symptomatic thoracic compression fracture CT complete, Deveshwar to review. Uncomplicated T6 fracture on CT, should be a candidate for VP/KP  Coumadin has been held, last INR 2.46 on 9/23 Will review imaging tomorrow with Dr. Estanislado Pandy and follow labs, need INR 1.5 or less. IR will see pt to discuss VP/KP of T6 fx.  Ascencion Dike PA-C Interventional Radiology 02/27/2015 8:26 AM

## 2015-02-27 NOTE — Progress Notes (Signed)
Patient request for a suppository, I have paged the MD.Patient noted throwing item off the table and appears upset verbalizing that "You need to call the doctor right away or I'll call 911 for it".  Order received. I am waiting for [facility] to verified medication. Patient also called wife and nursing facility expressing that staffs have not been in his room all day. Hourly rounding and care provided frequently today.Writer had conversation with patient at this time. No other concerns voice except requesting suppository. Will administer once available.   Ave Filter, RN

## 2015-02-27 NOTE — Progress Notes (Addendum)
PROGRESS NOTE  Peter Richards AJO:[id number] DOB: 04/19/1929 DOA: 02/21/2015 PCP: Mathews Argyle, MD  Assessment/Plan:  Acute lumbar compression fracture IR consulted for vertebroplasty. CT spine complete. Await recs.  Hold coumadin.  PMT adjusting meds-- pain finally controlled 9/25 -await INR-- may need vit K if not < 1.5 for procedure  Orthostatic hypotension -history of orthostatic hypotension per [name]. -ted hose  Urinary tract infection abx completed.  Diarrhea 2 days resolved  Atrial fibrillation On metoprolol and Coumadin. Currently in sinus rhythm, and rate controlled. Coumadin managed per pharmacy.  Coronary artery disease status post pacemaker placement and CABG Stable. Not currently having chest pain. On simvastatin and Coumadin therapy, nitroglycerin when necessary chest pain.  Idiopathic pulmonary fibrosis with COPD Chronically on 2 L of oxygen at home. Will continue home medications including DuoNeb's, Spiriva Symbicort and albuterol.  Diastolic Dysfunction Grade 1 DD in 2015 noted on Echo. -2-D echo now similar See above  Code Status: DNR Family Communication: patient Disposition Plan: back to SNF in AM if IR does not plan VP   Consultants:  PT  ortho- phone via ER  PMT  IR  Procedures:     HPI/Subjective: Pain much better today 99% of meal consumed-- patient sitting in chair  Objective: Filed Vitals:   02/27/15 0509  BP: 128/60  Pulse: 64  Temp: 98 F (36.7 C)  Resp: 17    Intake/Output Summary (Last 24 hours) at 02/27/15 0807 Last data filed at 02/27/15 0600  Gross per 24 hour  Intake    480 ml  Output   1750 ml  Net  -1270 ml   Filed Weights   02/21/15 0407 02/25/15 0500  Weight: 72.576 kg (160 lb) 76.159 kg (167 lb 14.4 oz)    Exam:   General:  Alert,  Appropriate.  Cardiovascular: irr  Respiratory: clear to auscultation bilaterally without wheezes rhonchi or rales. Breathing  nonlabored.  Abdomen: +BS, soft, nontender  Musculoskeletal: no edema   Data Reviewed: Basic Metabolic Panel:  Recent Labs Lab 02/21/15 0430 02/22/15 0629 02/24/15 0420  NA 137 136 136  K 3.9 4.1 4.6  CL 102 107 102  CO2 26 23 30   GLUCOSE 100* 98 103*  BUN 22* 15 16  CREATININE 1.10 0.90 0.86  CALCIUM 9.1 8.3* 9.0   Liver Function Tests: No results for input(s): AST, ALT, ALKPHOS, BILITOT, PROT, ALBUMIN in the last 168 hours. No results for input(s): LIPASE, AMYLASE in the last 168 hours. No results for input(s): AMMONIA in the last 168 hours. CBC:  Recent Labs Lab 02/21/15 0430 02/22/15 0629 02/24/15 0420  WBC 15.7* 12.1* 13.7*  NEUTROABS 11.9*  --   --   HGB 12.6* 11.3* 11.1*  HCT 37.7* 33.4* 34.4*  MCV 89.1 89.1 89.6  PLT 214 172 180   Cardiac Enzymes:  Recent Labs Lab 02/21/15 0430  TROPONINI <0.03   BNP (last 3 results)  Recent Labs  08/20/14 2155 02/24/15 1302  BNP 49.4 300.4*    ProBNP (last 3 results)  Recent Labs  12/31/14 0938  PROBNP 43.0    CBG: No results for input(s): GLUCAP in the last 168 hours.  Recent Results (from the past 240 hour(s))  Culture, Urine     Status: None   Collection Time: 02/21/15  1:22 PM  Result Value Ref Range Status   Specimen Description URINE, RANDOM  Final   Special Requests NONE  Final   Culture MULTIPLE SPECIES PRESENT, SUGGEST RECOLLECTION  Final   Report  Status 02/22/2015 FINAL  Final     Studies: Ct Cervical Spine Wo Contrast  02/25/2015   CLINICAL DATA:  Fall last Friday. Neck pain. History of spinal fusion.  EXAM: CT CERVICAL SPINE WITHOUT CONTRAST  TECHNIQUE: Multidetector CT imaging of the cervical spine was performed without intravenous contrast. Multiplanar CT image reconstructions were also generated.  COMPARISON:  02/21/2015  FINDINGS: Changes of anterior fusion from C4-C6 with solid bony fusion. Degenerative disc disease throughout the cervical spine. Degenerative facet disease with  grade 1 anterolisthesis of C7 on T1, stable. Prevertebral soft tissues are normal. No fracture. Diffuse degenerative facet disease. No epidural or paraspinal hematoma.  IMPRESSION: Degenerative disc and facet disease. Grade 1 anterolisthesis at at C7-T1.  Solid bony fusion from C4-C6 with anterior fusion plate.  No acute bony abnormality or change.   Electronically Signed   By: Rolm Baptise M.D.   On: 02/25/2015 15:33   Ct Thoracic Spine Wo Contrast  02/25/2015   CLINICAL DATA:  Neck and back pain post fall 1 week ago. Previous spinal fusion surgery.  EXAM: CT THORACIC SPINE WITHOUT CONTRAST  TECHNIQUE: Multidetector CT imaging of the thoracic spine was performed without intravenous contrast administration. Multiplanar CT image reconstructions were also generated.  COMPARISON:  02/21/2015 and earlier studies  FINDINGS: There is a mild compression fracture deformity of T6 with subtle cortical contour abnormality at the anterior and posterior margins of the vertebral body which were not evident on prior CT chest of 01/07/2015. There is no significant retropulsion. Posterior elements intact. No additional acute bone abnormality. Additional nonacute findings:  C4-C6:  Anterior cervical fusion hardware  partially visualized.  C6-7:  moderate narrowing of the  interspace.  C7-T1: Asymmetric facet DJD allowing 4 mm anterolisthesis, as before.  T1 -3:  Moderate narrowing of interspaces.  T8-9: Vacuum phenomenon in the interspace.  Anterior osteophytes.  T9-T12: Anterior endplate osteophytes at all levels. Vacuum phenomenon T10-T12.  Extensive aortic calcifications. Left transvenous cardiac pacing leads are partially visualized.  IMPRESSION: 1. Mild compression fracture deformity of T6, new since 72/62/0355, without complicating features. 2. Degenerative and postop changes as above.   Electronically Signed   By: Lucrezia Europe M.D.   On: 02/25/2015 15:47   Ct Lumbar Spine Wo Contrast  02/25/2015   CLINICAL DATA:  Status  post fall with back pain.  EXAM: CT LUMBAR SPINE WITHOUT CONTRAST  TECHNIQUE: Multidetector CT imaging of the lumbar spine was performed without intravenous contrast administration. Multiplanar CT image reconstructions were also generated.  COMPARISON:  Spine radiographs dated 02/21/2015, CT of the abdomen dated 07/05/2014  FINDINGS: There is no evidence of fracture or subluxation of the lumbosacral spine. There has been a prior vertebroplasty of L2 vertebral body with stable approximately 30% height loss. Mild compression deformity of L3 vertebral bodies stable. There has been a prior L4-L5 laminectomy and posterior fusion, without evidence of hardware misalignment or other complications. There is mild levo convex lumbosacral spine scoliosis with associated multilevel moderate to severe osteoarthritic changes of the visualized spine.  Long segment of aneurysmal dilation of the aorta at the aorto-by-iliac bifurcation on the background of atherosclerotic disease is stable with maximum diameter of 3.1 cm.  IMPRESSION: No evidence of fracture subluxation of the lumbosacral spine.  Chronic findings of prior L4-L5 laminectomy and posterior fusion, and L2 vertebroplasty.  Multilevel osteoarthritic changes.  Known  distal abdominal aortic aneurysm, stable.   Electronically Signed   By: Fidela Salisbury M.D.   On: 02/25/2015 15:57  Scheduled Meds: . acetaminophen  1,000 mg Oral TID  . budesonide (PULMICORT) nebulizer solution  0.25 mg Nebulization BID  . calcitonin (salmon)  1 spray Alternating Nares Daily  . cefUROXime  500 mg Oral BID WC  . diclofenac sodium  4 g Topical QID  . escitalopram  20 mg Oral Daily  . feeding supplement (ENSURE ENLIVE)  237 mL Oral BID BM  . fentaNYL  50 mcg Transdermal Q72H  . furosemide  20 mg Oral Daily  . ipratropium-albuterol  3 mL Nebulization TID  . lidocaine  1 patch Transdermal Q24H  . methocarbamol  500 mg Oral TID  . metoprolol succinate  25 mg Oral Daily  .  midodrine  5 mg Oral TID WC  . pregabalin  25 mg Oral QHS  . senna  2 tablet Oral QHS  . simvastatin  10 mg Oral QPM   Continuous Infusions:  Antibiotics Given (last 72 hours)    Date/Time Action Medication Dose Rate   02/24/15 0813 Given   cefTRIAXone (ROCEPHIN) 1 g in dextrose 5 % 50 mL IVPB 1 g 100 mL/hr   02/25/15 0855 Given   cefUROXime (CEFTIN) tablet 500 mg 500 mg    02/25/15 1615 Given   cefUROXime (CEFTIN) tablet 500 mg 500 mg    02/26/15 0913 Given   cefUROXime (CEFTIN) tablet 500 mg 500 mg    02/26/15 1709 Given   cefUROXime (CEFTIN) tablet 500 mg 500 mg       Principal Problem:   Lumbar compression fracture Active Problems:   Coronary atherosclerosis   HYPOTENSION, ORTHOSTATIC   History of cardiovascular disorder   PACEMAKER, PERMANENT   A-fib   Hx of bladder cancer   Dyslipidemia   COPD (chronic obstructive pulmonary disease)   Chronic respiratory failure with hypoxia   IPF (idiopathic pulmonary fibrosis)   Compression fracture   UTI (lower urinary tract infection)   Diarrhea   Palliative care encounter   Respiratory distress   Back pain   Traumatic compression fracture of T7 thoracic vertebra   Cervical pain (neck)  Time spent: 25 min  VANN, JESSICA  [facility] [email or web address], password [facility] 02/27/2015, 8:07 AM  LOS: 5 days

## 2015-02-27 NOTE — Progress Notes (Signed)
Daily Progress Note   Patient Name: Peter Richards       Date: 02/27/2015 DOB: [birth year]  Age: 81 y.o. MRN#: [id number] Attending Physician: Geradine Girt, DO Primary Care Physician: Mathews Argyle, MD Admit Date: 02/21/2015  Reason for Consultation/Follow-up: Pain control  Subjective: Pt reports feeling better today. Increased ROM to neck. Has gotten OOB with stand by assist only. Affect brighter. He does want to go forward with KP. IR note reviewed. Waiting for INR to drop to 1.5. His last PRM msIR was at 8pm. He is alert, oriented. Thought processes organized. Discussed increasing his Fentnayl TD to 6.42mcg from 50 mcg. He tells me today he does feel like the Fentanyl helps and is agreeable to increase. Said he slept better last night. No resp congestion, or dyspnea.  Interval Events: Started robaxin. Pain is better Length of Stay: 5 days  Current Medications: Scheduled Meds:  . acetaminophen  1,000 mg Oral TID  . budesonide (PULMICORT) nebulizer solution  0.25 mg Nebulization BID  . calcitonin (salmon)  1 spray Alternating Nares Daily  . cefUROXime  500 mg Oral BID WC  . diclofenac sodium  4 g Topical QID  . escitalopram  20 mg Oral Daily  . feeding supplement (ENSURE ENLIVE)  237 mL Oral BID BM  . fentaNYL  62.5 mcg Transdermal Q72H  . furosemide  20 mg Oral Daily  . ipratropium-albuterol  3 mL Nebulization TID  . lidocaine  1 patch Transdermal Q24H  . methocarbamol  500 mg Oral TID  . metoprolol succinate  25 mg Oral Daily  . midodrine  5 mg Oral TID WC  . pregabalin  25 mg Oral QHS  . senna  2 tablet Oral QHS  . simvastatin  10 mg Oral QPM    Continuous Infusions:    PRN Meds: albuterol, alum & mag hydroxide-simeth, morphine, ondansetron **OR** ondansetron (ZOFRAN) IV, zolpidem  Palliative Performance Scale: 50-60%     Vital Signs: BP 124/64 mmHg  Pulse 79  Temp(Src) 98.1 F (36.7 C) (Oral)  Resp 17  Ht 5\' 8"  (1.727 m)  Wt 76.159 kg (167 lb 14.4  oz)  BMI 25.54 kg/m2  SpO2 96% SpO2: SpO2: 96 % O2 Device: O2 Device: Nasal Cannula O2 Flow Rate: O2 Flow Rate (L/min): 2 L/min  Intake/output summary:  Intake/Output Summary (Last 24 hours) at 02/27/15 1120 Last data filed at 02/27/15 1011  Gross per 24 hour  Intake    720 ml  Output   1600 ml  Net   -880 ml   LBM:   Baseline Weight: Weight: 72.576 kg (160 lb) Most recent weight: Weight: 76.159 kg (167 lb 14.4 oz)  Physical Exam: General: Elderly man. Appears more comfortable Resp: No work of breathing Psych: Increased affective range. Verbalizing feelling more hopeful              Additional Data Reviewed: No results for input(s): WBC, HGB, PLT, NA, BUN, CREATININE, ALB in the last 72 hours.   Problem List:  Patient Active Problem List   Diagnosis Date Noted  . Cervical pain (neck)   . Respiratory distress   . Back pain   . Traumatic compression fracture of T7 thoracic vertebra   . Palliative care encounter   . Compression fracture 02/21/2015  . UTI (lower urinary tract infection) 02/21/2015  . Diarrhea 02/21/2015  . IPF (idiopathic pulmonary fibrosis) 01/12/2015  . Anxiety state 01/12/2015  . Chronic respiratory failure with hypoxia 12/30/2014  . Atypical chest  pain 12/30/2014  . Encounter for therapeutic drug monitoring 09/13/2014  . Acute and chronic respiratory failure with hypoxia 08/21/2014  . Low blood pressure 06/30/2014  . Pain in limb 11/09/2013  . Sepsis 10/03/2013  . Right carotid bruit 07/07/2013  . COPD (chronic obstructive pulmonary disease) 06/05/2012  . Interstitial lung disease 01/22/2012  . Lumbar compression fracture 08/24/2011  . Dyslipidemia 05/02/2011  . Low back pain 05/02/2011  . Hx of bladder cancer 01/01/2011  . A-fib 11/15/2010  . Atrial fibrillation 08/25/2010  . HYPERLIPIDEMIA 11/18/2009  . Coronary atherosclerosis 11/18/2009  . BRADYCARDIA 11/18/2009  . HYPOTENSION, ORTHOSTATIC 11/18/2009  . SYNCOPE 11/18/2009  . CHEST  PAIN 11/18/2009  . History of cardiovascular disorder 11/18/2009  . ATRIAL FIBRILLATION, HX OF 11/18/2009  . PACEMAKER, PERMANENT 11/18/2009     Palliative Care Assessment & Plan    Code Status:  DNR  Goals of Care:  Improved pain mgt  Go forward with KP as soon as possible. Rehab for deconditioning  Goal to return home  Symptom Management:  Pain: Improving. Continue current regimin and will up tirate fentnayl TD to 62.5 mcg q72 hours  Depression: Pt has been on lexapro for at least 2 years he states. He states he is also on "something that begins with BUS?" Reviewed home meds but sure what he is referring to. Cymbalta may be a good choice going forward to manage depression ; norepinephrine effect has been shown to be beneficial with pt' s who have concurrent depression and chronic pain.  Palliative Prophylaxis:  Bowel regimin  Psycho-social/Spiritual:  Desire for further Chaplaincy support:no   Prognosis: > 6 - 12 months Discharge Planning: Home with Home Health or rehab   Care plan was discussed with Dr. Eliseo Squires who is in agreement with up titration of fentanyl TD to 62.5 mcg  Thank you for allowing the Palliative Medicine Team to assist in the care of this patient.   Time In: 1000 Time Out: 1040 Total Time 40 Prolonged Time Billed  no     Greater than 50%  of this time was spent counseling and coordinating care related to the above assessment and plan.   Dory Horn, NP  02/27/2015, 11:20 AM  Please contact Palliative Medicine Team phone at [phone] for questions and concerns.

## 2015-02-28 ENCOUNTER — Ambulatory Visit: Payer: Commercial Managed Care - HMO | Admitting: Internal Medicine

## 2015-02-28 DIAGNOSIS — S32000S Wedge compression fracture of unspecified lumbar vertebra, sequela: Secondary | ICD-10-CM | POA: Diagnosis not present

## 2015-02-28 DIAGNOSIS — M546 Pain in thoracic spine: Secondary | ICD-10-CM | POA: Diagnosis not present

## 2015-02-28 DIAGNOSIS — I951 Orthostatic hypotension: Secondary | ICD-10-CM | POA: Diagnosis not present

## 2015-02-28 LAB — PROTIME-INR
INR: 1.61 — AB (ref 0.00–1.49)
Prothrombin Time: 19.2 seconds — ABNORMAL HIGH (ref 11.6–15.2)

## 2015-02-28 LAB — SURGICAL PCR SCREEN
MRSA, PCR: NEGATIVE
STAPHYLOCOCCUS AUREUS: POSITIVE — AB

## 2015-02-28 NOTE — Progress Notes (Signed)
Occupational Therapy Treatment Patient Details Name: Peter Richards MRN: [id number] DOB: Aug 21, 1928 Today's Date: 02/28/2015    History of present illness Pt is an 81 y/o male with PMH of A Fib on coumadin, orthostatic hypotension, CVA, MI, pacemaker, AAA, depression, COPD, C4-5 and C5-6 fusion, L4-5 fusion, prostate cancer, bladder cancer.  Pt with recent fall and back pain.  Imaging revealed acute mild T7 compression fracture and chronic L2 and L3 mild compression fractures. Treatment is nonsurgical management at this point.   OT comments  Pt making slow progress toward OT goals. Pt completed UB and LB bathing with min A, grooming with supervision while seated in chair. Verbal cues needed for pt to adhere to back precautions. Pt mod A to boost up from chair for sit <> stand transfer. D/c plan remains appropriate at this time. Continue to follow pt acutely.    Follow Up Recommendations  SNF;Supervision/Assistance - 24 hour    Equipment Recommendations  Other (comment) (defer to next venue of care)    Recommendations for Other Services      Precautions / Restrictions Precautions Precautions: Fall Precaution Comments: RLQ urostomy Restrictions Weight Bearing Restrictions: No       Mobility Bed Mobility               General bed mobility comments: Pt found in recliner, returned to recliner at end of session  Transfers Overall transfer level: Needs assistance Equipment used: Rolling walker (2 wheeled) Transfers: Sit to/from Stand Sit to Stand: Mod assist         General transfer comment: Mod A to boost up from recliner    Balance Overall balance assessment: Needs assistance         Standing balance support: During functional activity Standing balance-Leahy Scale: Poor                     ADL Overall ADL's : Needs assistance/impaired     Grooming: Wash/dry face;Applying deodorant;Brushing hair;Supervision/safety;Set up;Sitting   Upper Body  Bathing: Minimal assitance;Sitting   Lower Body Bathing: Minimal assistance;Cueing for compensatory techniques;Sit to/from stand                         General ADL Comments: Increased time needed for ADLs. Verbal cues for adhering to back precautions       Vision                     Perception     Praxis      Cognition   Behavior During Therapy: Flat affect Overall Cognitive Status: Within Functional Limits for tasks assessed                       Extremity/Trunk Assessment               Exercises     Shoulder Instructions       General Comments      Pertinent Vitals/ Pain       Pain Assessment: 0-10 Pain Score: 8  Pain Location: back  Pain Descriptors / Indicators: Aching;Shooting;Grimacing Pain Intervention(s): Limited activity within patient's tolerance;Monitored during session;Repositioned  Home Living                                          Prior Functioning/Environment  Frequency Min 2X/week     Progress Toward Goals  OT Goals(current goals can now be found in the care plan section)  Progress towards OT goals: Progressing toward goals  Acute Rehab OT Goals Patient Stated Goal: none stated  Plan Discharge plan remains appropriate    Co-evaluation                 End of Session Equipment Utilized During Treatment: Gait belt;Rolling walker   Activity Tolerance Patient tolerated treatment well   Patient Left in chair;with call bell/phone within reach   Nurse Communication          Time: 1224-4975 OT Time Calculation (min): 22 min  Charges: OT General Charges $OT Visit: 1 Procedure OT Treatments $Self Care/Home Management : 8-22 mins  Binnie Kand M.S., OTR/L Pager: [phone]  02/28/2015, 1:49 PM

## 2015-02-28 NOTE — Progress Notes (Signed)
PROGRESS NOTE  Peter Richards OJJ:[id number] DOB: [birth year] DOA: 02/21/2015 PCP: Mathews Argyle, MD  Assessment/Plan:  Acute lumbar compression fracture IR consulted for vertebroplasty. CT spine complete. Await recs.  Hold coumadin.  PMT adjusting meds-- pain finally controlled 9/25 -await INR--< 1.5 for vertebroplasty NPO after midnight  Orthostatic hypotension -history of orthostatic hypotension per [name]. -ted hose  Urinary tract infection abx completed.  Diarrhea 2 days resolved  Atrial fibrillation On metoprolol and Coumadin. Currently in sinus rhythm, and rate controlled. Coumadin managed per pharmacy.  Coronary artery disease status post pacemaker placement and CABG Stable. Not currently having chest pain. On simvastatin and Coumadin therapy, nitroglycerin when necessary chest pain.  Idiopathic pulmonary fibrosis with COPD Chronically on 2 L of oxygen at home. Will continue home medications including DuoNeb's, Spiriva Symbicort and albuterol.  Diastolic Dysfunction- NOT IN FAILURE Grade 1 DD in 2015 noted on Echo. -2-D echo now similar See above  Code Status: DNR Family Communication: patient Disposition Plan:    Consultants:  PT  ortho- phone via ER  PMT  IR  Procedures:     HPI/Subjective: Patient confused about procedure  Objective: Filed Vitals:   02/28/15 0941  BP: 125/68  Pulse: 80  Temp: 98.2 F (36.8 C)  Resp: 16    Intake/Output Summary (Last 24 hours) at 02/28/15 0955 Last data filed at 02/28/15 0942  Gross per 24 hour  Intake    830 ml  Output   1650 ml  Net   -820 ml   Filed Weights   02/21/15 0407 02/25/15 0500  Weight: 72.576 kg (160 lb) 76.159 kg (167 lb 14.4 oz)    Exam:   General:  Alert,  Mildly confused  Cardiovascular: irr  Respiratory: clear to auscultation bilaterally without wheezes rhonchi or rales. Breathing nonlabored.  Abdomen: +BS, soft, nontender  Musculoskeletal: no  edema   Data Reviewed: Basic Metabolic Panel:  Recent Labs Lab 02/22/15 0629 02/24/15 0420  NA 136 136  K 4.1 4.6  CL 107 102  CO2 23 30  GLUCOSE 98 103*  BUN 15 16  CREATININE 0.90 0.86  CALCIUM 8.3* 9.0   Liver Function Tests: No results for input(s): AST, ALT, ALKPHOS, BILITOT, PROT, ALBUMIN in the last 168 hours. No results for input(s): LIPASE, AMYLASE in the last 168 hours. No results for input(s): AMMONIA in the last 168 hours. CBC:  Recent Labs Lab 02/22/15 0629 02/24/15 0420  WBC 12.1* 13.7*  HGB 11.3* 11.1*  HCT 33.4* 34.4*  MCV 89.1 89.6  PLT 172 180   Cardiac Enzymes: No results for input(s): CKTOTAL, CKMB, CKMBINDEX, TROPONINI in the last 168 hours. BNP (last 3 results)  Recent Labs  08/20/14 2155 02/24/15 1302  BNP 49.4 300.4*    ProBNP (last 3 results)  Recent Labs  12/31/14 0938  PROBNP 43.0    CBG: No results for input(s): GLUCAP in the last 168 hours.  Recent Results (from the past 240 hour(s))  Culture, Urine     Status: None   Collection Time: 02/21/15  1:22 PM  Result Value Ref Range Status   Specimen Description URINE, RANDOM  Final   Special Requests NONE  Final   Culture MULTIPLE SPECIES PRESENT, SUGGEST RECOLLECTION  Final   Report Status 02/22/2015 FINAL  Final     Studies: No results found.  Scheduled Meds: . acetaminophen  1,000 mg Oral TID  . budesonide (PULMICORT) nebulizer solution  0.25 mg Nebulization BID  . calcitonin (salmon)  1 spray  Alternating Nares Daily  . diclofenac sodium  4 g Topical QID  . escitalopram  20 mg Oral Daily  . feeding supplement (ENSURE ENLIVE)  237 mL Oral BID BM  . fentaNYL  62.5 mcg Transdermal Q72H  . furosemide  20 mg Oral Daily  . ipratropium-albuterol  3 mL Nebulization TID  . lidocaine  1 patch Transdermal Q24H  . methocarbamol  500 mg Oral TID  . metoprolol succinate  25 mg Oral Daily  . midodrine  5 mg Oral TID WC  . pregabalin  25 mg Oral QHS  . senna  2 tablet Oral  QHS  . simvastatin  10 mg Oral QPM   Continuous Infusions:  Antibiotics Given (last 72 hours)    Date/Time Action Medication Dose   02/25/15 1615 Given   cefUROXime (CEFTIN) tablet 500 mg 500 mg   02/26/15 0913 Given   cefUROXime (CEFTIN) tablet 500 mg 500 mg   02/26/15 1709 Given   cefUROXime (CEFTIN) tablet 500 mg 500 mg   02/27/15 7092 Given   cefUROXime (CEFTIN) tablet 500 mg 500 mg   02/27/15 1710 Given   cefUROXime (CEFTIN) tablet 500 mg 500 mg      Principal Problem:   Lumbar compression fracture Active Problems:   Coronary atherosclerosis   HYPOTENSION, ORTHOSTATIC   History of cardiovascular disorder   PACEMAKER, PERMANENT   A-fib   Hx of bladder cancer   Dyslipidemia   COPD (chronic obstructive pulmonary disease)   Chronic respiratory failure with hypoxia   IPF (idiopathic pulmonary fibrosis)   Compression fracture   UTI (lower urinary tract infection)   Diarrhea   Palliative care encounter   Respiratory distress   Back pain   Traumatic compression fracture of T7 thoracic vertebra   Cervical pain (neck)  Time spent: 25 min  Shakea Isip  [facility] [email or web address], password [facility] 02/28/2015, 9:55 AM  LOS: 6 days

## 2015-02-28 NOTE — Progress Notes (Signed)
Brief Palliative medicine note:  Patient states he is doing okay this morning, fentanyl patch has improved his pain and morphine helping with breakthrough (required x1 breakthrough on 9/25 and x2 so far today). He is also on calcitonin. His INR is still >1.5, hopeful that this will be below 1.5 so that he may get kyphoplasty tomorrow by IR. He has no further complaints or concerns. Would still consider switching his lexapro to cymbalta for additional chronic pain management as well as for depression.  Tawanna Sat, MD 02/28/2015, 11:46 AM PGY-3, [place]

## 2015-02-28 NOTE — Clinical Documentation Improvement (Signed)
Internal Medicine  Please further clarify "Grade 1 diastolic dysfunction" and document findings in next progress note and include in discharge summary if applicable. Per Coding Rules and Guidelines, "dysfunction" doesn't equate to failure.   Other  Clinically Undetermined  Supporting Information:  Patient is currently on PO Lasix 20 mg daily and Toprol XL 25 mg. daily  Please exercise your independent, professional judgment when responding. A specific answer is not anticipated or expected.  Thank You, Zoila Shutter BSN, [place] [phone]

## 2015-02-28 NOTE — Consult Note (Signed)
Chief Complaint: Patient was seen in consultation today for back pain;  Acute Thoracic 6 fracture Chief Complaint  Patient presents with  . Fall  . Back Pain   at the request of [facility]- Dr Conley Canal; Dr Hilma Favors  Referring Physician(s): Dr Hilma Favors  History of Present Illness: Peter Richards is a 81 y.o. male   Pt fell at home over 1 week ago Continues to complain of worsening back pain Ranks 9/10 Pain meds help if lying still Worsens pain to move at all---especially to get up and walk Imaging:  IMPRESSION: 1. Mild compression fracture deformity of T6, new since 26/37/8588, without complicating features. 2. Degenerative and postop changes as above.  Upon examination- pt is definitely painful to palpate middle back area Request made for IR consideration of T6 vertebroplasty/kyphoplasty Dr Estanislado Pandy has reviewed imaging and approves procedure  Procedure tentatively scheduled for 9/27 dependent on INR Need INR 1.5 or lower to safely proceed (INR 1.6 9/26) Also----Insurance pre authorization approval pending Pt aware of these needs---will check labs and check with pt in am    Past Medical History  Diagnosis Date  . Hyperlipidemia   . Orthostatic hypotension   . Bradycardia   . Syncope     s/p PTVP  . Myocardial infarction     1981  . CAD (coronary artery disease)     prior CABG in 2002 negative Myoview in 2009  . Hypertension   . Stroke     TIA  2007    . GERD (gastroesophageal reflux disease)   . Headache(784.0)   . Arthritis     OSTEOPOROSIS  . Aortic stenosis, mild     by 01/08/08 echo  . Chronic anticoagulation   . Depression   . AAA (abdominal aortic aneurysm)   . Heart attack   . Pacemaker     medtronic pacer  . History of cervical spinal surgery     [place]  [name] HOH (hard of hearing)     BILATERAL HEARING AIDS  . Prostate cancer   . Basal cell adenocarcinoma     'bottom lip" (11/06/2012)  . Bladder cancer   . Attention to urostomy    "since 2006" (11/06/2012)  . Atrial fibrillation     on amiodarone and has PTVP in place  . Exertional shortness of breath   . Chronic radicular pain of lower back   . Anxiety   . COPD (chronic obstructive pulmonary disease)     Past Surgical History  Procedure Laterality Date  . Insert / replace / remove pacemaker      11/2009  DR BRODIE    . Prostatectomy      BLADDER ALSO REMOVED (OSTOMY BAG)  2006  FOR CANCER     . Abdominal aortic aneurysm repair  1999    STENT PLACED    . Cataract extraction w/ intraocular lens  implant, bilateral    . Appendectomy      1959  . Revision urostomy cutaneous    . Revision urostomy cutaneous  2006    Urostomy bag placed on right lower abdomen  . Vertebroplasty  08/22/2011    Procedure: VERTEBROPLASTY;  Surgeon: Winfield Cunas, MD;  Location: [place] [facility];  Service: Neurosurgery;  Laterality: N/A;  Lumbar Two vertebroplasty  . Cholecystectomy      4 YRS AGO  . Posterior lumbar fusion  11/06/2012  . Coronary artery bypass graft  1999    CABG X2  . Skin cancer  excision      "bottom lip" (11/06/2012)  . Bladder surgery      "for cancer" (11/06/2012)  . Lumbar laminectomy/decompression microdiscectomy Bilateral 11/06/2012    Procedure: LUMBAR LAMINECTOMY/DECOMPRESSION MICRODISCECTOMY;  Surgeon: Sinclair Ship, MD;  Location: [place];  Service: Orthopedics;  Laterality: Bilateral;  Lumbar 2-5 decompression    Allergies: Codeine  Medications: Prior to Admission medications   Medication Sig Start Date End Date Taking? Authorizing Provider  albuterol (PROVENTIL HFA;VENTOLIN HFA) 108 (90 BASE) MCG/ACT inhaler Inhale 2 puffs into the lungs every 6 (six) hours as needed for wheezing or shortness of breath. 11/16/14  Yes Collene Gobble, MD  budesonide-formoterol (SYMBICORT) 160-4.5 MCG/ACT inhaler Inhale 2 puffs into the lungs 2 (two) times daily. 12/27/14  Yes Tammy S Parrett, NP  escitalopram (LEXAPRO) 20 MG tablet Take 20 mg by mouth daily.   Yes  Historical Provider, MD  fentaNYL (DURAGESIC - DOSED MCG/HR) 25 MCG/HR patch Place 1 patch (25 mcg total) onto the skin every 3 (three) days. 10/06/13  Yes Belkys A Regalado, MD  furosemide (LASIX) 20 MG tablet Take 1 tablet (20 mg total) by mouth daily as needed (increased shortness of breath). 12/17/14  Yes Darlin Coco, MD  ipratropium-albuterol (DUONEB) 0.5-2.5 (3) MG/3ML SOLN Take 3 mLs by nebulization every 4 (four) hours as needed. 08/24/14  Yes Theodis Blaze, MD  metoprolol succinate (TOPROL-XL) 25 MG 24 hr tablet Take 0.5 tablets (12.5 mg total) by mouth daily. Patient taking differently: Take 25 mg by mouth daily.  08/03/14  Yes Darlin Coco, MD  PRESCRIPTION MEDICATION Wears 2 L of O2 continous   Yes Historical Provider, MD  simvastatin (ZOCOR) 10 MG tablet Take 10 mg by mouth every evening.   Yes Historical Provider, MD  VOLTAREN 1 % GEL Apply 2 g topically daily as needed (pain).  12/10/13  Yes Historical Provider, MD  warfarin (COUMADIN) 5 MG tablet Take 5-7.5 mg by mouth every evening. Take 7.5mg  on Monday, Wednesday and Friday. Take 5mg  all other days of the week. 10/06/13  Yes Belkys A Regalado, MD  zolpidem (AMBIEN) 5 MG tablet Take 1 tablet (5 mg total) by mouth at bedtime as needed for sleep. 01/06/15  Yes Darlin Coco, MD  alendronate (FOSAMAX) 70 MG tablet Take 1 tablet by mouth once a week. 12/24/14   Historical Provider, MD  ALPRAZolam Duanne Moron) 0.25 MG tablet Take 1 tablet (0.25 mg total) by mouth daily as needed for anxiety. 01/12/15   Brand Males, MD  HYDROcodone-acetaminophen (NORCO/VICODIN) 5-325 MG per tablet Take 1 tablet by mouth every 6 (six) hours as needed for moderate pain or severe pain.    Historical Provider, MD  nitroGLYCERIN (NITROSTAT) 0.4 MG SL tablet Place 0.4 mg under the tongue every 5 (five) minutes as needed for chest pain (MAX 3 TABLETS).    Historical Provider, MD  NON FORMULARY Alendronate sodium 70 mg 1 per week    Historical Provider, MD    tiotropium (SPIRIVA) 18 MCG inhalation capsule Place 1 capsule (18 mcg total) into inhaler and inhale daily as needed (for shortness of breath). 02/03/14   Collene Gobble, MD     Family History  Problem Relation Age of Onset  . Cancer Father     stomach  . Cancer Sister     lung  . Heart disease Sister   . Hypertension Sister   . Heart disease Mother   . Hypertension Mother   . Heart attack Neg Hx   .  Stroke Neg Hx     Social History   Social History  . Marital Status: Married    Spouse Name: N/A  . Number of Children: 3  . Years of Education: N/A   Occupational History  . Retired    Social History Main Topics  . Smoking status: Former Smoker -- 3.00 packs/day for 50 years    Types: Cigarettes    Quit date: 06/04/1978  . Smokeless tobacco: Current User    Types: Snuff  . Alcohol Use: No     Comment: rare "once a month"  . Drug Use: No  . Sexual Activity: No   Other Topics Concern  . None   Social History Narrative     Review of Systems: A 12 point ROS discussed and pertinent positives are indicated in the HPI above.  All other systems are negative.  Review of Systems  Constitutional: Positive for activity change and fatigue. Negative for fever.  Eyes: Negative for visual disturbance.  Respiratory: Positive for cough and shortness of breath.   Gastrointestinal: Negative for abdominal pain.  Musculoskeletal: Positive for back pain and gait problem.  Neurological: Positive for weakness.  Psychiatric/Behavioral: Negative for behavioral problems and confusion.    Vital Signs: BP 128/74 mmHg  Pulse 64  Temp(Src) 97.9 F (36.6 C) (Oral)  Resp 16  Ht 5\' 8"  (1.727 m)  Wt 167 lb 14.4 oz (76.159 kg)  BMI 25.54 kg/m2  SpO2 96%  Physical Exam  Constitutional: He is oriented to person, place, and time.  Cardiovascular: Normal rate, regular rhythm and normal heart sounds.   Pulmonary/Chest: Effort normal. He has wheezes.  Abdominal: Soft. Bowel sounds are  normal. There is no tenderness.  Musculoskeletal: Normal range of motion.  Painful to palpate at mid back area  Neurological: He is alert and oriented to person, place, and time.  Skin: Skin is warm and dry.  Psychiatric: He has a normal mood and affect. His behavior is normal. Judgment and thought content normal.  Nursing note and vitals reviewed.   Mallampati Score:     Imaging: Dg Thoracic Spine W/swimmers  02/21/2015   CLINICAL DATA:  [place] from bed last night. Intra scapular and low back pain. History of lumbar laminectomies.  EXAM: LUMBAR SPINE - COMPLETE 4+ VIEW; THORACIC SPINE - 3 VIEWS  COMPARISON:  CT abdomen and pelvis July 05, 2014 and CT chest January 07, 2015  FINDINGS: Mild T7 compression fracture is new from prior chest CT. The remaining thoracic vertebra bodies appear intact and aligned with maintenance of thoracic kyphosis osteopenia. Moderate lower thoracic disc height loss, endplate sclerosis and marginal spurring consistent with degenerative discs. Partially imaged ACDF. Broad thoracolumbar dextroscoliosis. LEFT cardiac pacemaker in situ. Extensive fibrotic changes of the lungs consistent with history of interstitial lung disease. Status post median sternotomy.  Lumbar vertebral bodies are intact and aligned, maintenance of lumbar lordosis. Mild chronic L2 compression fracture, status post cement augmentation. Mild chronic L3 compression deformity. Status post L4-5 PLIF, hardware appears intact, no periprosthetic lucency. Maintenance of lumbar lordosis. Broad levoscoliosis. No definite pars interarticularis defects though, partially obscured by hardware. Osteopenia. Surgical clips project in LEFT abdomen. Mild aortoiliac vascular calcifications.  IMPRESSION: Mild T7 compression fracture is likely acute, new from January 07, 2015. No thoracic malalignment.  No acute lumbar spine fracture deformity or malalignment.  Chronic L2 and L3 mild compression fractures, status post L2 cement  augmentation. L4-5 PLIF.   Electronically Signed   By: Thana Farr.D.  On: 02/21/2015 05:06   Dg Lumbar Spine Complete  02/21/2015   CLINICAL DATA:  [place] from bed last night. Intra scapular and low back pain. History of lumbar laminectomies.  EXAM: LUMBAR SPINE - COMPLETE 4+ VIEW; THORACIC SPINE - 3 VIEWS  COMPARISON:  CT abdomen and pelvis July 05, 2014 and CT chest January 07, 2015  FINDINGS: Mild T7 compression fracture is new from prior chest CT. The remaining thoracic vertebra bodies appear intact and aligned with maintenance of thoracic kyphosis osteopenia. Moderate lower thoracic disc height loss, endplate sclerosis and marginal spurring consistent with degenerative discs. Partially imaged ACDF. Broad thoracolumbar dextroscoliosis. LEFT cardiac pacemaker in situ. Extensive fibrotic changes of the lungs consistent with history of interstitial lung disease. Status post median sternotomy.  Lumbar vertebral bodies are intact and aligned, maintenance of lumbar lordosis. Mild chronic L2 compression fracture, status post cement augmentation. Mild chronic L3 compression deformity. Status post L4-5 PLIF, hardware appears intact, no periprosthetic lucency. Maintenance of lumbar lordosis. Broad levoscoliosis. No definite pars interarticularis defects though, partially obscured by hardware. Osteopenia. Surgical clips project in LEFT abdomen. Mild aortoiliac vascular calcifications.  IMPRESSION: Mild T7 compression fracture is likely acute, new from January 07, 2015. No thoracic malalignment.  No acute lumbar spine fracture deformity or malalignment.  Chronic L2 and L3 mild compression fractures, status post L2 cement augmentation. L4-5 PLIF.   Electronically Signed   By: Elon Alas M.D.   On: 02/21/2015 05:06   Ct Head Wo Contrast  02/21/2015   CLINICAL DATA:  Dizzy on way to bathroom, fell and hit back of head on dresser. Headache headaches and extreme lower cervical neck pain.  EXAM: CT HEAD  WITHOUT CONTRAST  CT CERVICAL SPINE WITHOUT CONTRAST  TECHNIQUE: Multidetector CT imaging of the head and cervical spine was performed following the standard protocol without intravenous contrast. Multiplanar CT image reconstructions of the cervical spine were also generated.  COMPARISON:  CT head February 25, 2013  FINDINGS: CT HEAD FINDINGS  The ventricles and sulci are normal for age. No intraparenchymal hemorrhage, mass effect nor midline shift. Patchy supratentorial white matter hypodensities are within normal range for patient's age and though non-specific suggest sequelae of chronic small vessel ischemic disease. No acute large vascular territory infarcts.  No abnormal extra-axial fluid collections. Basal cisterns are patent. Moderate calcific atherosclerosis of the carotid siphons.  No skull fracture. The included ocular globes and orbital contents are non-suspicious. Status post bilateral ocular lens implants. Trace paranasal sinus mucosal thickening without air-fluid levels. The mastoid air cells are well aerated. Patient is edentulous. Small amount of LEFT suboccipital scalp soft tissue stranding.  CT CERVICAL SPINE FINDINGS  Cervical vertebral bodies and posterior elements are intact. Grade 1 C7-T1 anterolisthesis without spondylolysis. C4-5 and C5-6 ACDF with solid interbody fusion, anterior plate and screw fixation appears intact, well seated without periprosthetic lucency. Severe C6-7 moderate to severe C7-T1 disc height loss, uncovertebral hypertrophy and endplate spurring compatible with degenerative disc, mild at C3-4. C1-2 articulation maintained with moderate arthropathy. Small amount of calcified pannus about the odontoid process can be seen with CPPD. No destructive bony lesions. Severe RIGHT C2-3 neural foraminal narrowing. Moderate to severe C7-T1 neural foraminal narrowing.  Scarring in the upper lobes of the lungs, partially imaged, with bullous changes. No prevertebral soft tissue  swelling. Pacemaker wires noted. Moderate calcific atherosclerosis of the RIGHT carotid bulb.  IMPRESSION: CT HEAD: No acute intracranial process.  Stable chronic changes of the brain: Involutional changes and mild to moderate  white matter small vessel ischemic disease.  CT CERVICAL SPINE: No acute cervical spine fracture. Grade 1 C7-T1 anterolisthesis on degenerative basis.  C4-5 and C5-6 solid fusion.   Electronically Signed   By: Elon Alas M.D.   On: 02/21/2015 05:50   Ct Cervical Spine Wo Contrast  02/25/2015   CLINICAL DATA:  Fall last Friday. Neck pain. History of spinal fusion.  EXAM: CT CERVICAL SPINE WITHOUT CONTRAST  TECHNIQUE: Multidetector CT imaging of the cervical spine was performed without intravenous contrast. Multiplanar CT image reconstructions were also generated.  COMPARISON:  02/21/2015  FINDINGS: Changes of anterior fusion from C4-C6 with solid bony fusion. Degenerative disc disease throughout the cervical spine. Degenerative facet disease with grade 1 anterolisthesis of C7 on T1, stable. Prevertebral soft tissues are normal. No fracture. Diffuse degenerative facet disease. No epidural or paraspinal hematoma.  IMPRESSION: Degenerative disc and facet disease. Grade 1 anterolisthesis at at C7-T1.  Solid bony fusion from C4-C6 with anterior fusion plate.  No acute bony abnormality or change.   Electronically Signed   By: Rolm Baptise M.D.   On: 02/25/2015 15:33   Ct Cervical Spine Wo Contrast  02/21/2015   CLINICAL DATA:  Dizzy on way to bathroom, fell and hit back of head on dresser. Headache headaches and extreme lower cervical neck pain.  EXAM: CT HEAD WITHOUT CONTRAST  CT CERVICAL SPINE WITHOUT CONTRAST  TECHNIQUE: Multidetector CT imaging of the head and cervical spine was performed following the standard protocol without intravenous contrast. Multiplanar CT image reconstructions of the cervical spine were also generated.  COMPARISON:  CT head February 25, 2013  FINDINGS: CT  HEAD FINDINGS  The ventricles and sulci are normal for age. No intraparenchymal hemorrhage, mass effect nor midline shift. Patchy supratentorial white matter hypodensities are within normal range for patient's age and though non-specific suggest sequelae of chronic small vessel ischemic disease. No acute large vascular territory infarcts.  No abnormal extra-axial fluid collections. Basal cisterns are patent. Moderate calcific atherosclerosis of the carotid siphons.  No skull fracture. The included ocular globes and orbital contents are non-suspicious. Status post bilateral ocular lens implants. Trace paranasal sinus mucosal thickening without air-fluid levels. The mastoid air cells are well aerated. Patient is edentulous. Small amount of LEFT suboccipital scalp soft tissue stranding.  CT CERVICAL SPINE FINDINGS  Cervical vertebral bodies and posterior elements are intact. Grade 1 C7-T1 anterolisthesis without spondylolysis. C4-5 and C5-6 ACDF with solid interbody fusion, anterior plate and screw fixation appears intact, well seated without periprosthetic lucency. Severe C6-7 moderate to severe C7-T1 disc height loss, uncovertebral hypertrophy and endplate spurring compatible with degenerative disc, mild at C3-4. C1-2 articulation maintained with moderate arthropathy. Small amount of calcified pannus about the odontoid process can be seen with CPPD. No destructive bony lesions. Severe RIGHT C2-3 neural foraminal narrowing. Moderate to severe C7-T1 neural foraminal narrowing.  Scarring in the upper lobes of the lungs, partially imaged, with bullous changes. No prevertebral soft tissue swelling. Pacemaker wires noted. Moderate calcific atherosclerosis of the RIGHT carotid bulb.  IMPRESSION: CT HEAD: No acute intracranial process.  Stable chronic changes of the brain: Involutional changes and mild to moderate white matter small vessel ischemic disease.  CT CERVICAL SPINE: No acute cervical spine fracture. Grade 1 C7-T1  anterolisthesis on degenerative basis.  C4-5 and C5-6 solid fusion.   Electronically Signed   By: Elon Alas M.D.   On: 02/21/2015 05:50   Ct Thoracic Spine Wo Contrast  02/25/2015   CLINICAL DATA:  Neck and back pain post fall 1 week ago. Previous spinal fusion surgery.  EXAM: CT THORACIC SPINE WITHOUT CONTRAST  TECHNIQUE: Multidetector CT imaging of the thoracic spine was performed without intravenous contrast administration. Multiplanar CT image reconstructions were also generated.  COMPARISON:  02/21/2015 and earlier studies  FINDINGS: There is a mild compression fracture deformity of T6 with subtle cortical contour abnormality at the anterior and posterior margins of the vertebral body which were not evident on prior CT chest of 01/07/2015. There is no significant retropulsion. Posterior elements intact. No additional acute bone abnormality. Additional nonacute findings:  C4-C6:  Anterior cervical fusion hardware  partially visualized.  C6-7:  moderate narrowing of the  interspace.  C7-T1: Asymmetric facet DJD allowing 4 mm anterolisthesis, as before.  T1 -3:  Moderate narrowing of interspaces.  T8-9: Vacuum phenomenon in the interspace.  Anterior osteophytes.  T9-T12: Anterior endplate osteophytes at all levels. Vacuum phenomenon T10-T12.  Extensive aortic calcifications. Left transvenous cardiac pacing leads are partially visualized.  IMPRESSION: 1. Mild compression fracture deformity of T6, new since 40/98/1191, without complicating features. 2. Degenerative and postop changes as above.   Electronically Signed   By: Lucrezia Europe M.D.   On: 02/25/2015 15:47   Ct Lumbar Spine Wo Contrast  02/25/2015   CLINICAL DATA:  Status post fall with back pain.  EXAM: CT LUMBAR SPINE WITHOUT CONTRAST  TECHNIQUE: Multidetector CT imaging of the lumbar spine was performed without intravenous contrast administration. Multiplanar CT image reconstructions were also generated.  COMPARISON:  Spine radiographs dated  02/21/2015, CT of the abdomen dated 07/05/2014  FINDINGS: There is no evidence of fracture or subluxation of the lumbosacral spine. There has been a prior vertebroplasty of L2 vertebral body with stable approximately 30% height loss. Mild compression deformity of L3 vertebral bodies stable. There has been a prior L4-L5 laminectomy and posterior fusion, without evidence of hardware misalignment or other complications. There is mild levo convex lumbosacral spine scoliosis with associated multilevel moderate to severe osteoarthritic changes of the visualized spine.  Long segment of aneurysmal dilation of the aorta at the aorto-by-iliac bifurcation on the background of atherosclerotic disease is stable with maximum diameter of 3.1 cm.  IMPRESSION: No evidence of fracture subluxation of the lumbosacral spine.  Chronic findings of prior L4-L5 laminectomy and posterior fusion, and L2 vertebroplasty.  Multilevel osteoarthritic changes.  Known  distal abdominal aortic aneurysm, stable.   Electronically Signed   By: Fidela Salisbury M.D.   On: 02/25/2015 15:57   Dg Chest Port 1 View  02/24/2015   CLINICAL DATA:  Respiratory distress  EXAM: PORTABLE CHEST 1 VIEW  COMPARISON:  CT 01/07/2015  FINDINGS: LEFT-sided pacemaker overlies stable cardiac silhouette. There is chronic interstitial lung disease again noted most severe in the LEFT lower lobe and RIGHT upper lobe. No clear superimposed infection or edema.  IMPRESSION: Chronic interstitial lung disease similar to prior. Difficult to exclude superimposed infection or edema but no clear acute process identified.   Electronically Signed   By: Suzy Bouchard M.D.   On: 02/24/2015 14:13    Labs:  CBC:  Recent Labs  12/31/14 0938 02/21/15 0430 02/22/15 0629 02/24/15 0420  WBC 13.9* 15.7* 12.1* 13.7*  HGB 13.5 12.6* 11.3* 11.1*  HCT 40.4 37.7* 33.4* 34.4*  PLT 243.0 214 172 180    COAGS:  Recent Labs  02/24/15 0420 02/25/15 0531 02/27/15 1100  02/28/15 0518  INR 2.33* 2.46* 2.26* 1.61*    BMP:  Recent Labs  08/24/14  3338 12/31/14 0938 02/21/15 0430 02/22/15 0629 02/24/15 0420  NA 139 142 137 136 136  K 4.1 4.6 3.9 4.1 4.6  CL 103 105 102 107 102  CO2 26 27 26 23 30   GLUCOSE 104* 101* 100* 98 103*  BUN 24* 24* 22* 15 16  CALCIUM 9.2 9.7 9.1 8.3* 9.0  CREATININE 0.97 1.18 1.10 0.90 0.86  GFRNONAA 74*  --  60* >60 >60  GFRAA 85*  --  >60 >60 >60    LIVER FUNCTION TESTS:  Recent Labs  08/21/14 0439  BILITOT 0.5  AST 29  ALT 14  ALKPHOS 55  PROT 6.1  ALBUMIN 3.1*    TUMOR MARKERS: No results for input(s): AFPTM, CEA, CA199, CHROMGRNA in the last 8760 hours.  Assessment and Plan:  Falls at home Painful acute T6 fx on imaging Tentatively scheduled for T6 VP/KP in IR 9/27 Check labs in am-- need INR 1.5 or lower (INR 1.6 9/26);  afeb Continue to HOLD coumadin Risks and Benefits discussed with the patient including, but not limited to education regarding the natural healing process of compression fractures without intervention, bleeding, infection, cement migration which may cause spinal cord damage, paralysis, pulmonary embolism or even death. All of the patient's questions were answered, patient is agreeable to proceed. Consent signed and in chart.  Await Ins pre authorization approval - IR checking now   Thank you for this interesting consult.  I greatly enjoyed meeting Peter Richards and look forward to participating in their care.  A copy of this report was sent to the requesting provider on this date.  Signed: Kory Panjwani A 02/28/2015, 9:38 AM   I spent a total of 40 Minutes    in face to face in clinical consultation, greater than 50% of which was counseling/coordinating care for T6 VP/KP

## 2015-02-28 NOTE — Care Management Important Message (Signed)
Important Message  Patient Details  Name: Peter Richards MRN: [id number] Date of Birth: Apr 03, 1929   Medicare Important Message Given:  Yes-third notification given    Delorse Lek 02/28/2015, 3:18 PM

## 2015-02-28 NOTE — Progress Notes (Signed)
Physical Therapy Treatment Patient Details Name: Peter Richards MRN: [id number] DOB: 09-Jul-1928 Today's Date: 02/28/2015    History of Present Illness Pt is an 81 y/o male with PMH of A Fib on coumadin, orthostatic hypotension, CVA, MI, pacemaker, AAA, depression, COPD, C4-5 and C5-6 fusion, L4-5 fusion, prostate cancer, bladder cancer.  Pt with recent fall and back pain.  Imaging revealed acute mild T7 compression fracture and chronic L2 and L3 mild compression fractures. Treatment is nonsurgical management at this point.    PT Comments    Pt with improved ambulation tolerance this date however demo's decreased safety awareness and requires min/modA for walker safety during ambulation to prevent fall. Cont' to recommend SNF upon d/c to allow pt to achieve safe mod I level of function for safe transition home with spouse.  Follow Up Recommendations  Supervision/Assistance - 24 hour;SNF     Equipment Recommendations  None recommended by PT    Recommendations for Other Services       Precautions / Restrictions Precautions Precautions: Fall Precaution Comments: RLQ urostomy Restrictions Weight Bearing Restrictions: No    Mobility  Bed Mobility               General bed mobility comments: pt up in chair  Transfers Overall transfer level: Needs assistance Equipment used: Rolling walker (2 wheeled) Transfers: Sit to/from Stand Sit to Stand: Min assist         General transfer comment: max v/c's for safe use of hands, pt with posterior lean upon initial stand bracing self on chair to get balance  Ambulation/Gait Ambulation/Gait assistance: Min guard Ambulation Distance (Feet): 120 Feet Assistive device: Rolling walker (2 wheeled) Gait Pattern/deviations: Step-through pattern;Decreased stride length;Shuffle Gait velocity: slow   General Gait Details: minA for safe walker management, pt with difficulty turning with LOB during every turn (x4). max v/c's for safe  walker management when turning to sit in chair, pt at risk of falling during turns   Science writer    Modified Rankin (Stroke Patients Only)       Balance Overall balance assessment: Needs assistance         Standing balance support: Bilateral upper extremity supported Standing balance-Leahy Scale: Poor                      Cognition Arousal/Alertness: Awake/alert Behavior During Therapy: Flat affect Overall Cognitive Status: Within Functional Limits for tasks assessed           Safety/Judgement: Decreased awareness of safety          Exercises General Exercises - Lower Extremity Ankle Circles/Pumps: AROM;Both;10 reps Long Arc Quad: AROM;Both;10 reps;Seated Hip Flexion/Marching: AROM;Both;10 reps Mini-Sqauts: AROM;Both;10 reps    General Comments        Pertinent Vitals/Pain Pain Assessment: 0-10 Pain Score: 7  Pain Location: back Pain Descriptors / Indicators: Aching;Shooting Pain Intervention(s): Limited activity within patient's tolerance    Home Living                      Prior Function            PT Goals (current goals can now be found in the care plan section) Acute Rehab PT Goals Patient Stated Goal: none stated Progress towards PT goals: Progressing toward goals    Frequency  Min 3X/week    PT Plan Frequency needs to be updated    Co-evaluation  End of Session Equipment Utilized During Treatment: Gait belt;Oxygen Activity Tolerance: Patient limited by pain Patient left: in chair;with call bell/phone within reach;with chair alarm set     Time: 1345-1410 PT Time Calculation (min) (ACUTE ONLY): 25 min  Charges:  $Gait Training: 8-22 mins $Therapeutic Exercise: 8-22 mins                    G Codes:  Functional Assessment Tool Used: clinical judgement Functional Limitation: Mobility: Walking and moving around Mobility: Walking and Moving Around Current Status  [phone]): At least 40 percent but less than 60 percent impaired, limited or restricted Mobility: Walking and Moving Around Goal Status [phone]): At least 1 percent but less than 20 percent impaired, limited or restricted   Kingsley Callander 02/28/2015, 4:21 PM   Kittie Plater, PT, DPT Pager #: [phone] Office #: [phone]

## 2015-03-01 ENCOUNTER — Encounter (HOSPITAL_COMMUNITY): Payer: Self-pay | Admitting: Student

## 2015-03-01 ENCOUNTER — Observation Stay (HOSPITAL_COMMUNITY): Payer: Commercial Managed Care - HMO

## 2015-03-01 DIAGNOSIS — S32000S Wedge compression fracture of unspecified lumbar vertebra, sequela: Secondary | ICD-10-CM | POA: Diagnosis not present

## 2015-03-01 DIAGNOSIS — I951 Orthostatic hypotension: Secondary | ICD-10-CM | POA: Diagnosis not present

## 2015-03-01 DIAGNOSIS — S22060S Wedge compression fracture of T7-T8 vertebra, sequela: Secondary | ICD-10-CM

## 2015-03-01 DIAGNOSIS — N390 Urinary tract infection, site not specified: Secondary | ICD-10-CM | POA: Diagnosis not present

## 2015-03-01 LAB — BASIC METABOLIC PANEL
Anion gap: 9 (ref 5–15)
BUN: 26 mg/dL — AB (ref 6–20)
CALCIUM: 9.2 mg/dL (ref 8.9–10.3)
CO2: 28 mmol/L (ref 22–32)
CREATININE: 0.9 mg/dL (ref 0.61–1.24)
Chloride: 99 mmol/L — ABNORMAL LOW (ref 101–111)
GFR calc non Af Amer: >60 mL/min (ref >60)
Glucose, Bld: 104 mg/dL — ABNORMAL HIGH (ref 65–99)
Potassium: 5 mmol/L (ref 3.5–5.1)
Sodium: 136 mmol/L (ref 135–145)

## 2015-03-01 LAB — CBC
HCT: 35.3 % — ABNORMAL LOW (ref 39.0–52.0)
HEMOGLOBIN: 11.5 g/dL — AB (ref 13.0–17.0)
MCH: 30 pg (ref 26.0–34.0)
MCHC: 32.6 g/dL (ref 30.0–36.0)
MCV: 92.2 fL (ref 78.0–100.0)
PLATELETS: 258 10*3/uL (ref 150–400)
RBC: 3.83 MIL/uL — AB (ref 4.22–5.81)
RDW: 15.8 % — ABNORMAL HIGH (ref 11.5–15.5)
WBC: 9.9 10*3/uL (ref 4.0–10.5)

## 2015-03-01 LAB — PROTIME-INR
INR: 1.33 (ref 0.00–1.49)
PROTHROMBIN TIME: 16.6 s — AB (ref 11.6–15.2)

## 2015-03-01 LAB — APTT: aPTT: 26 seconds (ref 24–37)

## 2015-03-01 LAB — GLUCOSE, CAPILLARY: Glucose-Capillary: 104 mg/dL — ABNORMAL HIGH (ref 65–99)

## 2015-03-01 MED ORDER — TOBRAMYCIN SULFATE 1.2 G IJ SOLR
INTRAMUSCULAR | Status: AC
  Filled 2015-03-01: qty 1.2

## 2015-03-01 MED ORDER — WARFARIN SODIUM 5 MG PO TABS
5.0000 mg | ORAL_TABLET | Freq: Once | ORAL | Status: AC
  Administered 2015-03-01: 5 mg via ORAL
  Filled 2015-03-01: qty 1

## 2015-03-01 MED ORDER — GELATIN ABSORBABLE 12-7 MM EX MISC
CUTANEOUS | Status: AC
  Filled 2015-03-01: qty 1

## 2015-03-01 MED ORDER — MIDAZOLAM HCL 2 MG/2ML IJ SOLN
INTRAMUSCULAR | Status: AC
  Filled 2015-03-01: qty 2

## 2015-03-01 MED ORDER — SODIUM CHLORIDE 0.9 % IV SOLN
INTRAVENOUS | Status: AC
  Administered 2015-03-01: 17:00:00 via INTRAVENOUS

## 2015-03-01 MED ORDER — MIDAZOLAM HCL 2 MG/2ML IJ SOLN
INTRAMUSCULAR | Status: AC | PRN
  Administered 2015-03-01: 1 mg via INTRAVENOUS

## 2015-03-01 MED ORDER — WARFARIN - PHARMACIST DOSING INPATIENT: Freq: Every day | Status: DC

## 2015-03-01 MED ORDER — IOHEXOL 300 MG/ML  SOLN: 50.0000 mL | Freq: Once | INTRAMUSCULAR | Status: DC | PRN

## 2015-03-01 MED ORDER — BUPIVACAINE HCL (PF) 0.25 % IJ SOLN
INTRAMUSCULAR | Status: AC
  Filled 2015-03-01: qty 30

## 2015-03-01 MED ORDER — FENTANYL CITRATE (PF) 100 MCG/2ML IJ SOLN
INTRAMUSCULAR | Status: AC
  Filled 2015-03-01: qty 2

## 2015-03-01 MED ORDER — FENTANYL CITRATE (PF) 100 MCG/2ML IJ SOLN
INTRAMUSCULAR | Status: AC | PRN
  Administered 2015-03-01: 25 ug via INTRAVENOUS

## 2015-03-01 MED ORDER — HYDROMORPHONE HCL 1 MG/ML IJ SOLN
INTRAMUSCULAR | Status: AC
  Filled 2015-03-01: qty 1

## 2015-03-01 MED ORDER — CEFAZOLIN SODIUM-DEXTROSE 2-3 GM-% IV SOLR
2.0000 g | Freq: Once | INTRAVENOUS | Status: AC
  Administered 2015-03-01: 2 g via INTRAVENOUS

## 2015-03-01 MED ORDER — CEFAZOLIN SODIUM-DEXTROSE 2-3 GM-% IV SOLR
INTRAVENOUS | Status: AC
  Filled 2015-03-01: qty 50

## 2015-03-01 NOTE — Care Management Note (Signed)
Case Management Note  Patient Details  Name: Peter Richards MRN: [id number] Date of Birth: 1929-03-23  Subjective/Objective:                    Action/Plan: Patient admitted with lumbar compression fracture. Patient is from [facility]. CM will continue to follow for discharge needs.   Expected Discharge Date:                  Expected Discharge Plan:  Assisted Living / Rest Home  In-House Referral:     Discharge planning Services     Post Acute Care Choice:    Choice offered to:     DME Arranged:    DME Agency:     HH Arranged:    HH Agency:     Status of Service:  In process, will continue to follow  Medicare Important Message Given:  Yes-third notification given Date Medicare IM Given:    Medicare IM give by:    Date Additional Medicare IM Given:    Additional Medicare Important Message give by:     If discussed at [place] of Stay Meetings, dates discussed:    Additional Comments:  Pollie Friar, RN 03/01/2015, 12:22 PM

## 2015-03-01 NOTE — Progress Notes (Signed)
PROGRESS NOTE  Peter Richards NLZ:[id number] DOB: Mar 30, 1929 DOA: 02/21/2015 PCP: Mathews Argyle, MD  Assessment/Plan:  Acute lumbar compression fracture IR consulted for vertebroplasty. CT spine complete. Await recs.  Hold coumadin.  PMT adjusting meds-- pain finally controlled 9/25 -await INR--< 1.5 for vertebroplasty NPO after midnight  Orthostatic hypotension -history of orthostatic hypotension per [name]. -ted hose  Urinary tract infection abx completed.  Diarrhea 2 days resolved  Atrial fibrillation On metoprolol and Coumadin. Currently in sinus rhythm, and rate controlled. Coumadin managed per pharmacy.  Coronary artery disease status post pacemaker placement and CABG Stable. Not currently having chest pain. On simvastatin and Coumadin therapy, nitroglycerin when necessary chest pain.  Idiopathic pulmonary fibrosis with COPD Chronically on 2 L of oxygen at home. Will continue home medications including DuoNeb's, Spiriva Symbicort and albuterol.  Diastolic Dysfunction- NOT IN FAILURE Grade 1 DD in 2015 noted on Echo. -2-D echo now similar See above  Code Status: DNR Family Communication: patient Disposition Plan: SNF when procedure done   Consultants:  PT  ortho- phone via ER  PMT  IR  Procedures:     HPI/Subjective: Asking if his procedure has been done already  Objective: Filed Vitals:   03/01/15 0733  BP: 132/65  Pulse: 62  Temp: 97.6 F (36.4 C)  Resp: 18    Intake/Output Summary (Last 24 hours) at 03/01/15 0815 Last data filed at 03/01/15 0420  Gross per 24 hour  Intake    490 ml  Output   1700 ml  Net  -1210 ml   Filed Weights   02/21/15 0407 02/25/15 0500  Weight: 72.576 kg (160 lb) 76.159 kg (167 lb 14.4 oz)    Exam:   General:  Alert,  Mildly confused  Cardiovascular: irr  Respiratory: +wheezing.  Abdomen: +BS, soft, nontender  Musculoskeletal: no edema   Data Reviewed: Basic Metabolic  Panel:  Recent Labs Lab 02/24/15 0420 03/01/15 0545  NA 136 136  K 4.6 5.0  CL 102 99*  CO2 30 28  GLUCOSE 103* 104*  BUN 16 26*  CREATININE 0.86 0.90  CALCIUM 9.0 9.2   Liver Function Tests: No results for input(s): AST, ALT, ALKPHOS, BILITOT, PROT, ALBUMIN in the last 168 hours. No results for input(s): LIPASE, AMYLASE in the last 168 hours. No results for input(s): AMMONIA in the last 168 hours. CBC:  Recent Labs Lab 02/24/15 0420 03/01/15 0545  WBC 13.7* 9.9  HGB 11.1* 11.5*  HCT 34.4* 35.3*  MCV 89.6 92.2  PLT 180 258   Cardiac Enzymes: No results for input(s): CKTOTAL, CKMB, CKMBINDEX, TROPONINI in the last 168 hours. BNP (last 3 results)  Recent Labs  08/20/14 2155 02/24/15 1302  BNP 49.4 300.4*    ProBNP (last 3 results)  Recent Labs  12/31/14 0938  PROBNP 43.0    CBG: No results for input(s): GLUCAP in the last 168 hours.  Recent Results (from the past 240 hour(s))  Culture, Urine     Status: None   Collection Time: 02/21/15  1:22 PM  Result Value Ref Range Status   Specimen Description URINE, RANDOM  Final   Special Requests NONE  Final   Culture MULTIPLE SPECIES PRESENT, SUGGEST RECOLLECTION  Final   Report Status 02/22/2015 FINAL  Final  Surgical pcr screen     Status: Abnormal   Collection Time: 02/28/15  9:41 PM  Result Value Ref Range Status   MRSA, PCR NEGATIVE NEGATIVE Final   Staphylococcus aureus POSITIVE (A) NEGATIVE Final  Comment:        The Xpert SA Assay (FDA approved for NASAL specimens in patients over 63 years of age), is one component of a comprehensive surveillance program.  Test performance has been validated by [employer] for patients greater than or equal to 42 year old. It is not intended to diagnose infection nor to guide or monitor treatment.      Studies: No results found.  Scheduled Meds: . acetaminophen  1,000 mg Oral TID  . budesonide (PULMICORT) nebulizer solution  0.25 mg Nebulization BID   . calcitonin (salmon)  1 spray Alternating Nares Daily  . diclofenac sodium  4 g Topical QID  . escitalopram  20 mg Oral Daily  . feeding supplement (ENSURE ENLIVE)  237 mL Oral BID BM  . fentaNYL  62.5 mcg Transdermal Q72H  . furosemide  20 mg Oral Daily  . ipratropium-albuterol  3 mL Nebulization TID  . lidocaine  1 patch Transdermal Q24H  . methocarbamol  500 mg Oral TID  . metoprolol succinate  25 mg Oral Daily  . midodrine  5 mg Oral TID WC  . pregabalin  25 mg Oral QHS  . senna  2 tablet Oral QHS  . simvastatin  10 mg Oral QPM   Continuous Infusions:  Antibiotics Given (last 72 hours)    Date/Time Action Medication Dose   02/26/15 0913 Given   cefUROXime (CEFTIN) tablet 500 mg 500 mg   02/26/15 1709 Given   cefUROXime (CEFTIN) tablet 500 mg 500 mg   02/27/15 8850 Given   cefUROXime (CEFTIN) tablet 500 mg 500 mg   02/27/15 1710 Given   cefUROXime (CEFTIN) tablet 500 mg 500 mg      Principal Problem:   Lumbar compression fracture Active Problems:   Coronary atherosclerosis   HYPOTENSION, ORTHOSTATIC   History of cardiovascular disorder   PACEMAKER, PERMANENT   A-fib   Hx of bladder cancer   Dyslipidemia   COPD (chronic obstructive pulmonary disease)   Chronic respiratory failure with hypoxia   IPF (idiopathic pulmonary fibrosis)   Compression fracture   UTI (lower urinary tract infection)   Diarrhea   Palliative care encounter   Respiratory distress   Back pain   Traumatic compression fracture of T7 thoracic vertebra   Cervical pain (neck)  Time spent: 25 min  VANN, JESSICA  [facility] [email or web address], password [facility] 03/01/2015, 8:15 AM  LOS: 7 days

## 2015-03-01 NOTE — Progress Notes (Signed)
ANTICOAGULATION CONSULT NOTE - Initial Consult  Pharmacy Consult for coumadin Indication: atrial fibrillation  Allergies  Allergen Reactions  . Codeine Nausea And Vomiting    Patient Measurements: Height: 5\' 8"  (172.7 cm) Weight: 167 lb 14.4 oz (76.159 kg) IBW/kg (Calculated) : 68.4 Heparin Dosing Weight:   Vital Signs: Temp: 98.9 F (37.2 C) (09/27 0940) Temp Source: Oral (09/27 0940) BP: 127/74 mmHg (09/27 1430) Pulse Rate: 61 (09/27 1430)  Labs:  Recent Labs  02/27/15 1100 02/28/15 0518 03/01/15 0545  HGB  --   --  11.5*  HCT  --   --  35.3*  PLT  --   --  258  APTT  --   --  26  LABPROT 24.7* 19.2* 16.6*  INR 2.26* 1.61* 1.33  CREATININE  --   --  0.90    Estimated Creatinine Clearance: 59.1 mL/min (by C-G formula based on Cr of 0.9).   Medical History: Past Medical History  Diagnosis Date  . Hyperlipidemia   . Orthostatic hypotension   . Bradycardia   . Syncope     s/p PTVP  . Myocardial infarction     1981  . CAD (coronary artery disease)     prior CABG in 2002 negative Myoview in 2009  . Hypertension   . Stroke     TIA  2007    . GERD (gastroesophageal reflux disease)   . Headache(784.0)   . Arthritis     OSTEOPOROSIS  . Aortic stenosis, mild     by 01/08/08 echo  . Chronic anticoagulation   . Depression   . AAA (abdominal aortic aneurysm)   . Heart attack   . Pacemaker     medtronic pacer  . History of cervical spinal surgery     [place]  [name] HOH (hard of hearing)     BILATERAL HEARING AIDS  . Prostate cancer   . Basal cell adenocarcinoma     'bottom lip" (11/06/2012)  . Bladder cancer   . Attention to urostomy     "since 2006" (11/06/2012)  . Atrial fibrillation     on amiodarone and has PTVP in place  . Exertional shortness of breath   . Chronic radicular pain of lower back   . Anxiety   . COPD (chronic obstructive pulmonary disease)     Medications:  Scheduled:  . acetaminophen  1,000 mg Oral TID  . budesonide  (PULMICORT) nebulizer solution  0.25 mg Nebulization BID  . bupivacaine (PF)      . calcitonin (salmon)  1 spray Alternating Nares Daily  . ceFAZolin      . diclofenac sodium  4 g Topical QID  . escitalopram  20 mg Oral Daily  . feeding supplement (ENSURE ENLIVE)  237 mL Oral BID BM  . fentaNYL  62.5 mcg Transdermal Q72H  . fentaNYL      . furosemide  20 mg Oral Daily  . gelatin adsorbable      . ipratropium-albuterol  3 mL Nebulization TID  . lidocaine  1 patch Transdermal Q24H  . methocarbamol  500 mg Oral TID  . metoprolol succinate  25 mg Oral Daily  . midazolam      . midodrine  5 mg Oral TID WC  . pregabalin  25 mg Oral QHS  . senna  2 tablet Oral QHS  . simvastatin  10 mg Oral QPM  . tobramycin       Infusions:    Assessment: 81 yo male  with afib will be continued on coumadin.  INR today is 1.33. Patient was on coumadin 7.5 mg MWF and 5 mg other days prior to admission. Admitting INR 3.02.  S/p veterbroplasty today and team wants to restart coumadin.   Goal of Therapy:  INR 2-3 Monitor platelets by anticoagulation protocol: Yes   Plan:  - coumadin 5 mg po x1 - daily PT/INR  So, Tsz-Yin 03/01/2015,3:26 PM

## 2015-03-01 NOTE — Discharge Instructions (Signed)
1.No stooping,bending   Or lifting more than 10 lbs for 2 weeks. °2.Use walker to ambulate for 2 weeks °3.RTC in 2 weeks °

## 2015-03-01 NOTE — Procedures (Signed)
S/P T6 VP 

## 2015-03-01 NOTE — Progress Notes (Signed)
PT Cancellation Note  Patient Details Name: Peter Richards MRN: [id number] DOB: 1929-03-25   Cancelled Treatment:    Reason Eval/Treat Not Completed: Patient at procedure or test/unavailable. Pt leaving for kyphoplasty. PT to return as able/when appropriate s/p surgery.    Kingsley Callander 03/01/2015, 1:27 PM   Kittie Plater, PT, DPT Pager #: [phone] Office #: [phone]

## 2015-03-01 NOTE — Progress Notes (Signed)
Patient arrived back from IR. Patient neuro assessment unchanged from AM assessment. Patient is currently laying flat, O2 in place, patient asking to eat food and drink water. MD paged regarding patient's diet. Bed alarm on.Will continue to monitor.

## 2015-03-02 DIAGNOSIS — I4891 Unspecified atrial fibrillation: Secondary | ICD-10-CM

## 2015-03-02 DIAGNOSIS — J449 Chronic obstructive pulmonary disease, unspecified: Secondary | ICD-10-CM | POA: Diagnosis not present

## 2015-03-02 DIAGNOSIS — R197 Diarrhea, unspecified: Secondary | ICD-10-CM

## 2015-03-02 DIAGNOSIS — S32000A Wedge compression fracture of unspecified lumbar vertebra, initial encounter for closed fracture: Secondary | ICD-10-CM | POA: Diagnosis not present

## 2015-03-02 LAB — PROTIME-INR
INR: 1.26 (ref 0.00–1.49)
Prothrombin Time: 16 seconds — ABNORMAL HIGH (ref 11.6–15.2)

## 2015-03-02 MED ORDER — FENTANYL 12 MCG/HR TD PT72: 62.5000 ug | MEDICATED_PATCH | TRANSDERMAL | Status: DC

## 2015-03-02 MED ORDER — WARFARIN SODIUM 7.5 MG PO TABS: 7.5000 mg | ORAL_TABLET | Freq: Once | ORAL | Status: DC

## 2015-03-02 MED ORDER — DICLOFENAC SODIUM 1 % TD GEL: 4.0000 g | Freq: Four times a day (QID) | TRANSDERMAL | Status: DC

## 2015-03-02 MED ORDER — METOPROLOL SUCCINATE ER 25 MG PO TB24: 25.0000 mg | ORAL_TABLET | Freq: Every day | ORAL | Status: AC

## 2015-03-02 MED ORDER — PREGABALIN 25 MG PO CAPS: 25.0000 mg | ORAL_CAPSULE | Freq: Every day | ORAL | Status: DC

## 2015-03-02 MED ORDER — BISACODYL 10 MG RE SUPP: 10.0000 mg | Freq: Every day | RECTAL | Status: DC | PRN

## 2015-03-02 MED ORDER — MIDODRINE HCL 5 MG PO TABS: 5.0000 mg | ORAL_TABLET | Freq: Three times a day (TID) | ORAL | Status: AC

## 2015-03-02 MED ORDER — SENNA 8.6 MG PO TABS: 2.0000 | ORAL_TABLET | Freq: Every day | ORAL | Status: DC

## 2015-03-02 MED ORDER — ENSURE ENLIVE PO LIQD: 237.0000 mL | Freq: Two times a day (BID) | ORAL | Status: DC

## 2015-03-02 MED ORDER — METHOCARBAMOL 500 MG PO TABS: 500.0000 mg | ORAL_TABLET | Freq: Three times a day (TID) | ORAL | Status: AC

## 2015-03-02 MED ORDER — ACETAMINOPHEN 500 MG PO TABS: 1000.0000 mg | ORAL_TABLET | Freq: Three times a day (TID) | ORAL | Status: DC

## 2015-03-02 MED ORDER — CALCITONIN (SALMON) 200 UNIT/ACT NA SOLN: 1.0000 | Freq: Every day | NASAL | Status: DC

## 2015-03-02 MED ORDER — MORPHINE SULFATE 15 MG PO TABS: 15.0000 mg | ORAL_TABLET | ORAL | Status: DC | PRN

## 2015-03-02 NOTE — Progress Notes (Signed)
Physical Therapy Treatment Patient Details Name: Peter Richards MRN: [id number] DOB: 02-Apr-1929 Today's Date: 03/02/2015    History of Present Illness Pt is an 81 y/o male with PMH of A Fib on coumadin, orthostatic hypotension, CVA, MI, pacemaker, AAA, depression, COPD, C4-5 and C5-6 fusion, L4-5 fusion, prostate cancer, bladder cancer.  Pt with recent fall and back pain.  Imaging revealed acute mild T7 compression fracture and chronic L2 and L3 mild compression fractures. Underwent kyphoplasty 03/01/15.    PT Comments    Pt with 10/10 back pain this date greatly limiting ambulation tolerance this date. Pt assisted back to bed and educated on importance of sitting up right in supportive chair not reclined, as being in a recliner arches back and can cause increased pain. Pt remains appropriate for SNF upon d/c to maximize functional recovery.   Follow Up Recommendations  Supervision/Assistance - 24 hour;SNF     Equipment Recommendations  None recommended by PT    Recommendations for Other Services       Precautions / Restrictions Precautions Precautions: Fall Precaution Comments: RLQ urostomy Restrictions Weight Bearing Restrictions: No    Mobility  Bed Mobility Overal bed mobility: Needs Assistance Bed Mobility: Sit to Sidelying Rolling: Supervision       Sit to sidelying: Mod assist General bed mobility comments: assist for LE management, max directional v/c's for technique  Transfers Overall transfer level: Needs assistance Equipment used: Rolling walker (2 wheeled) Transfers: Sit to/from Stand Sit to Stand: Min assist         General transfer comment: pt with strong posterior lean upon standing requiring minA to maintain balance  Ambulation/Gait Ambulation/Gait assistance: Min guard Ambulation Distance (Feet): 30 Feet Assistive device: Rolling walker (2 wheeled) Gait Pattern/deviations: Decreased stride length;Step-through pattern;Shuffle Gait velocity:  slow Gait velocity interpretation: Below normal speed for age/gender General Gait Details: minA for walker safety, limited by pain, unable to achieve full back extension   Stairs            Wheelchair Mobility    Modified Rankin (Stroke Patients Only)       Balance Overall balance assessment: Needs assistance   Sitting balance-Leahy Scale: Fair     Standing balance support: Bilateral upper extremity supported Standing balance-Leahy Scale: Poor Standing balance comment: posterior lean                    Cognition Arousal/Alertness: Lethargic Behavior During Therapy: WFL for tasks assessed/performed Overall Cognitive Status: Within Functional Limits for tasks assessed       Memory: Decreased short-term memory   Safety/Judgement: Decreased awareness of safety     General Comments: pt repeatedly calling his family stating he was ready to go home    Exercises      General Comments        Pertinent Vitals/Pain Pain Assessment: 0-10 Pain Score: 10-Worst pain ever Pain Location: back Pain Intervention(s): Limited activity within patient's tolerance    Home Living                      Prior Function            PT Goals (current goals can now be found in the care plan section) Acute Rehab PT Goals Patient Stated Goal: go to [name] today Progress towards PT goals: Progressing toward goals    Frequency  Min 3X/week    PT Plan Current plan remains appropriate    Co-evaluation  End of Session Equipment Utilized During Treatment: Gait belt;Oxygen Activity Tolerance: Patient limited by pain Patient left: in chair;with call bell/phone within reach;with chair alarm set     Time: [id number] PT Time Calculation (min) (ACUTE ONLY): 17 min  Charges:  $Gait Training: 8-22 mins                    G Codes:      Kingsley Callander 03/02/2015, 11:01 AM   Kittie Plater, PT, DPT Pager #: [phone] Office #:  [phone]

## 2015-03-02 NOTE — Progress Notes (Signed)
Occupational Therapy Treatment Patient Details Name: Peter Richards MRN: [id number] DOB: 28-Aug-1928 Today's Date: 03/02/2015    History of present illness Pt is an 81 y/o male with PMH of A Fib on coumadin, orthostatic hypotension, CVA, MI, pacemaker, AAA, depression, COPD, C4-5 and C5-6 fusion, L4-5 fusion, prostate cancer, bladder cancer.  Pt with recent fall and back pain.  Imaging revealed acute mild T7 compression fracture and chronic L2 and L3 mild compression fractures. Underwent kyphoplasty 03/01/15.   OT comments  Pt anxious about plan for discharge today to [facility], assured pt that social worker was aware and working with facility and [facility].  Focus of session on ADL training.  Performed one grooming task with min assist at sink and 2 more in standing. Min assist for UB bathing and set up for dressing.  Mod assist for LB.  Pt with poor control of descent and posterior lean in standing.    Follow Up Recommendations  SNF;Supervision/Assistance - 24 hour    Equipment Recommendations       Recommendations for Other Services      Precautions / Restrictions Precautions Precautions: Fall Precaution Comments: RLQ urostomy Restrictions Weight Bearing Restrictions: No       Mobility Bed Mobility               General bed mobility comments: at EOB with RN upon arrival  Transfers Overall transfer level: Needs assistance Equipment used: Rolling walker (2 wheeled) Transfers: Sit to/from Stand Sit to Stand: Min assist         General transfer comment: decreased control of descent, requiring moderate assist and verbal cues     Balance     Sitting balance-Leahy Scale: Fair       Standing balance-Leahy Scale: Poor Standing balance comment: posterior lean                   ADL Overall ADL's : Needs assistance/impaired     Grooming: Wash/dry hands;Standing;Minimal assistance;Wash/dry face;Sitting;Brushing hair Grooming Details (indicate  cue type and reason): washed hands in standing, sat to wash face and comb hair Upper Body Bathing: Minimal assitance;Sitting Upper Body Bathing Details (indicate cue type and reason): assisted for back Lower Body Bathing: Maximal assistance;Sit to/from stand   Upper Body Dressing : Set up;Sitting   Lower Body Dressing: Moderate assistance;Sit to/from stand Lower Body Dressing Details (indicate cue type and reason): assist to start underwear over feet, pt pulled up Toilet Transfer: Minimal assistance;RW;Ambulation   Toileting- Clothing Manipulation and Hygiene: Moderate assistance;Sit to/from stand       Functional mobility during ADLs: Minimal assistance;Rolling walker General ADL Comments: Pt distracted about discharge, needed frequent redirection.      Vision                     Perception     Praxis      Cognition   Behavior During Therapy: Anxious;Flat affect Overall Cognitive Status: No family/caregiver present to determine baseline cognitive functioning       Memory: Decreased short-term memory    Safety/Judgement: Decreased awareness of safety     General Comments: pt repeatedly calling his family stating he was ready to go home    Extremity/Trunk Assessment               Exercises     Shoulder Instructions       General Comments      Pertinent Vitals/ Pain       Pain  Assessment: No/denies pain  Home Living                                          Prior Functioning/Environment              Frequency Min 2X/week     Progress Toward Goals  OT Goals(current goals can now be found in the care plan section)  Progress towards OT goals: Progressing toward goals  Acute Rehab OT Goals Patient Stated Goal: go to [name] today Time For Goal Achievement: 03/09/15 Potential to Achieve Goals: Good  Plan Discharge plan remains appropriate    Co-evaluation                 End of Session Equipment  Utilized During Treatment: Gait belt;Rolling walker;Oxygen (2L)   Activity Tolerance Patient limited by fatigue   Patient Left in chair;with call bell/phone within reach;with chair alarm set (RT in room)   Nurse Communication          Time: [phone] OT Time Calculation (min): 33 min  Charges: OT General Charges $OT Visit: 1 Procedure OT Treatments $Self Care/Home Management : 23-37 mins  Malka So 03/02/2015, 9:31 AM  [phone]

## 2015-03-02 NOTE — Progress Notes (Signed)
Patient ready for discharge to [facility], to be transported by [facility]. IV removed. Patient and family aware of transfer.  Report given to receiving nurse. Patient's neuro assessment unchanged.

## 2015-03-02 NOTE — Progress Notes (Signed)
      Subjective: Follow up T6 VP Pt feeling better, pain much improved Sitting up in chair.  Objective: Physical Exam: BP 119/53 mmHg  Pulse 64  Temp(Src) 98 F (36.7 C) (Oral)  Resp 18  Ht 5\' 8"  (1.727 m)  Wt 167 lb 14.4 oz (76.159 kg)  BMI 25.54 kg/m2  SpO2 94% Back: procedure site clean, dry, no hematoma. NT    Labs: CBC  Recent Labs  03/01/15 0545  WBC 9.9  HGB 11.5*  HCT 35.3*  PLT 258   BMET  Recent Labs  03/01/15 0545  NA 136  K 5.0  CL 99*  CO2 28  GLUCOSE 104*  BUN 26*  CREATININE 0.90  CALCIUM 9.2   LFT No results for input(s): PROT, ALBUMIN, AST, ALT, ALKPHOS, BILITOT, BILIDIR, IBILI, LIPASE in the last 72 hours. PT/INR  Recent Labs  03/01/15 0545 03/02/15 0645  LABPROT 16.6* 16.0*  INR 1.33 1.26     Studies/Results: No results found.  Assessment/Plan: S/p T6 VP May work with PT Use RW at all times next 2 weeks.    LOS: 8 days    Ascencion Dike PA-C 03/02/2015 9:49 AM

## 2015-03-02 NOTE — Discharge Summary (Signed)
Physician Discharge Summary  LORCAN SHELP WFU:[id number] DOB: 06/06/1928 DOA: 02/21/2015  PCP: Mathews Argyle, MD  Admit date: 02/21/2015 Discharge date: 03/02/2015  Time spent: 35 minutes  Recommendations for Outpatient Follow-up:  1. INR Monday Use RW at all times next 2 weeks Continue O2- 2L  Discharge Diagnoses:  Principal Problem:   Lumbar compression fracture Active Problems:   Coronary atherosclerosis   HYPOTENSION, ORTHOSTATIC   History of cardiovascular disorder   PACEMAKER, PERMANENT   A-fib   Hx of bladder cancer   Dyslipidemia   COPD (chronic obstructive pulmonary disease)   Chronic respiratory failure with hypoxia   IPF (idiopathic pulmonary fibrosis)   Compression fracture   UTI (lower urinary tract infection)   Diarrhea   Palliative care encounter   Respiratory distress   Back pain   Traumatic compression fracture of T7 thoracic vertebra   Cervical pain (neck)   Discharge Condition: improved  Diet recommendation: cardiac  Filed Weights   02/21/15 0407 02/25/15 0500  Weight: 72.576 kg (160 lb) 76.159 kg (167 lb 14.4 oz)    History of present illness:  Cedrik Heindl is a 81 y.o. male, with A. fib on Coumadin, pulmonary fibrosis, orthostasis, pacemaker in place, history of CVA, and cervical fusion/ACDF who fell at 2 AM this morning and presented to the emergency department with acute on chronic back pain. Mr. Vonbargen reports that he awoke abruptly this morning needing have diarrhea bowel movement. He attempted to go to the bathroom but fell as he rounded the corner of his bed. He remembers hitting the floor and hitting his head. His back pain is located centrally, it's described as stabbing, it is constant, movement makes it worse, there are no relieving factors. He reports that for the past week his appetite has been decreased. For the last 2 days he has been having watery diarrhea bowel movements, approximately 2 per day. He also describes a  lower abdominal pain. He denies chest pain, palpitations, lower extremity swelling, fever and cough. He does report a decreased exercise tolerance over the past several months. He used to be able to walk down the hallway without becoming winded now he can barely make it across the room. He is on 2 L of oxygen at home.  In the emergency department his white count is elevated at 15.7, INR is within therapeutic range. X-ray of the lumbar spine shows mild T7 compression fracture, UA is positive for infection. CT head shows no acute changes or bleeding.  Hospital Course:  Acute lumbar compression fracture IR consulted for vertebroplasty. CT spine complete. Await recs.  Hold coumadin. PMT adjusting meds-- pain controlled -s/p vertebroplasty  Orthostatic hypotension -history of orthostatic hypotension per [name]. -ted hose  Urinary tract infection abx completed.  Diarrhea 2 days resolved  Atrial fibrillation On metoprolol and Coumadin. Currently in sinus rhythm, and rate controlled. Coumadin managed per pharmacy.  Coronary artery disease status post pacemaker placement and CABG Stable. Not currently having chest pain. On simvastatin and Coumadin therapy, nitroglycerin when necessary chest pain.  Idiopathic pulmonary fibrosis with COPD Chronically on 2 L of oxygen at home. Will continue home medications including DuoNeb's, Spiriva Symbicort and albuterol.  Diastolic Dysfunction- NOT IN FAILURE Grade 1 DD in 2015 noted on Echo. -2-D echo now similar See above   Procedures:    Consultations:  IR  Palliative care  Discharge Exam: Filed Vitals:   03/02/15 0945  BP: 111/58  Pulse: 84  Temp:   Resp:  General: awake, NAD Cardiovascular: rrr Respiratory: clear  Discharge Instructions   Discharge Instructions    Diet - low sodium heart healthy    Complete by:  As directed      Discharge instructions    Complete by:  As directed   2L O2 INR on  Monday calcitonin nose spray until 10/3     Increase activity slowly    Complete by:  As directed           Current Discharge Medication List    START taking these medications   Details  acetaminophen (TYLENOL) 500 MG tablet Take 2 tablets (1,000 mg total) by mouth 3 (three) times daily. Qty: 30 tablet, Refills: 0    bisacodyl (DULCOLAX) 10 MG suppository Place 1 suppository (10 mg total) rectally daily as needed for moderate constipation. Qty: 12 suppository, Refills: 0    calcitonin, salmon, (MIACALCIN/FORTICAL) 200 UNIT/ACT nasal spray Place 1 spray into alternate nostrils daily. Qty: 3.7 mL, Refills: 12    feeding supplement, ENSURE ENLIVE, (ENSURE ENLIVE) LIQD Take 237 mLs by mouth 2 (two) times daily between meals. Qty: 237 mL, Refills: 12    fentaNYL (DURAGESIC - DOSED MCG/HR) 12 MCG/HR Place 5 patches (62.5 mcg total) onto the skin every 3 (three) days. Qty: 5 patch, Refills: 0    methocarbamol (ROBAXIN) 500 MG tablet Take 1 tablet (500 mg total) by mouth 3 (three) times daily. Qty: 10 tablet, Refills: 0    midodrine (PROAMATINE) 5 MG tablet Take 1 tablet (5 mg total) by mouth 3 (three) times daily with meals.    morphine (MSIR) 15 MG tablet Take 1 tablet (15 mg total) by mouth every 2 (two) hours as needed (BREAKTHROUGH PAIN). Qty: 30 tablet, Refills: 0    pregabalin (LYRICA) 25 MG capsule Take 1 capsule (25 mg total) by mouth at bedtime.    senna (SENOKOT) 8.6 MG TABS tablet Take 2 tablets (17.2 mg total) by mouth at bedtime. Qty: 120 each, Refills: 0      CONTINUE these medications which have CHANGED   Details  diclofenac sodium (VOLTAREN) 1 % GEL Apply 4 g topically 4 (four) times daily.    metoprolol succinate (TOPROL-XL) 25 MG 24 hr tablet Take 1 tablet (25 mg total) by mouth daily.      CONTINUE these medications which have NOT CHANGED   Details  albuterol (PROVENTIL HFA;VENTOLIN HFA) 108 (90 BASE) MCG/ACT inhaler Inhale 2 puffs into the lungs every 6  (six) hours as needed for wheezing or shortness of breath. Qty: 1 Inhaler, Refills: 5    budesonide-formoterol (SYMBICORT) 160-4.5 MCG/ACT inhaler Inhale 2 puffs into the lungs 2 (two) times daily. Qty: 1 Inhaler, Refills: 5    escitalopram (LEXAPRO) 20 MG tablet Take 20 mg by mouth daily.    furosemide (LASIX) 20 MG tablet Take 1 tablet (20 mg total) by mouth daily as needed (increased shortness of breath). Qty: 30 tablet, Refills: 3    ipratropium-albuterol (DUONEB) 0.5-2.5 (3) MG/3ML SOLN Take 3 mLs by nebulization every 4 (four) hours as needed. Qty: 360 mL, Refills: 5    PRESCRIPTION MEDICATION Wears 2 L of O2 continous    simvastatin (ZOCOR) 10 MG tablet Take 10 mg by mouth every evening.    warfarin (COUMADIN) 5 MG tablet Take 5-7.5 mg by mouth every evening. Take 7.5mg  on Monday, Wednesday and Friday. Take 5mg  all other days of the week.    alendronate (FOSAMAX) 70 MG tablet Take 1 tablet by mouth once a  week.      STOP taking these medications     fentaNYL (DURAGESIC - DOSED MCG/HR) 25 MCG/HR patch      zolpidem (AMBIEN) 5 MG tablet      ALPRAZolam (XANAX) 0.25 MG tablet      HYDROcodone-acetaminophen (NORCO/VICODIN) 5-325 MG per tablet      nitroGLYCERIN (NITROSTAT) 0.4 MG SL tablet      NON FORMULARY      tiotropium (SPIRIVA) 18 MCG inhalation capsule        Allergies  Allergen Reactions  . Codeine Nausea And Vomiting      The results of significant diagnostics from this hospitalization (including imaging, microbiology, ancillary and laboratory) are listed below for reference.    Significant Diagnostic Studies: Dg Thoracic Spine W/swimmers  02/21/2015   CLINICAL DATA:  [place] from bed last night. Intra scapular and low back pain. History of lumbar laminectomies.  EXAM: LUMBAR SPINE - COMPLETE 4+ VIEW; THORACIC SPINE - 3 VIEWS  COMPARISON:  CT abdomen and pelvis July 05, 2014 and CT chest January 07, 2015  FINDINGS: Mild T7 compression fracture is new from  prior chest CT. The remaining thoracic vertebra bodies appear intact and aligned with maintenance of thoracic kyphosis osteopenia. Moderate lower thoracic disc height loss, endplate sclerosis and marginal spurring consistent with degenerative discs. Partially imaged ACDF. Broad thoracolumbar dextroscoliosis. LEFT cardiac pacemaker in situ. Extensive fibrotic changes of the lungs consistent with history of interstitial lung disease. Status post median sternotomy.  Lumbar vertebral bodies are intact and aligned, maintenance of lumbar lordosis. Mild chronic L2 compression fracture, status post cement augmentation. Mild chronic L3 compression deformity. Status post L4-5 PLIF, hardware appears intact, no periprosthetic lucency. Maintenance of lumbar lordosis. Broad levoscoliosis. No definite pars interarticularis defects though, partially obscured by hardware. Osteopenia. Surgical clips project in LEFT abdomen. Mild aortoiliac vascular calcifications.  IMPRESSION: Mild T7 compression fracture is likely acute, new from January 07, 2015. No thoracic malalignment.  No acute lumbar spine fracture deformity or malalignment.  Chronic L2 and L3 mild compression fractures, status post L2 cement augmentation. L4-5 PLIF.   Electronically Signed   By: Elon Alas M.D.   On: 02/21/2015 05:06   Dg Lumbar Spine Complete  02/21/2015   CLINICAL DATA:  [place] from bed last night. Intra scapular and low back pain. History of lumbar laminectomies.  EXAM: LUMBAR SPINE - COMPLETE 4+ VIEW; THORACIC SPINE - 3 VIEWS  COMPARISON:  CT abdomen and pelvis July 05, 2014 and CT chest January 07, 2015  FINDINGS: Mild T7 compression fracture is new from prior chest CT. The remaining thoracic vertebra bodies appear intact and aligned with maintenance of thoracic kyphosis osteopenia. Moderate lower thoracic disc height loss, endplate sclerosis and marginal spurring consistent with degenerative discs. Partially imaged ACDF. Broad thoracolumbar  dextroscoliosis. LEFT cardiac pacemaker in situ. Extensive fibrotic changes of the lungs consistent with history of interstitial lung disease. Status post median sternotomy.  Lumbar vertebral bodies are intact and aligned, maintenance of lumbar lordosis. Mild chronic L2 compression fracture, status post cement augmentation. Mild chronic L3 compression deformity. Status post L4-5 PLIF, hardware appears intact, no periprosthetic lucency. Maintenance of lumbar lordosis. Broad levoscoliosis. No definite pars interarticularis defects though, partially obscured by hardware. Osteopenia. Surgical clips project in LEFT abdomen. Mild aortoiliac vascular calcifications.  IMPRESSION: Mild T7 compression fracture is likely acute, new from January 07, 2015. No thoracic malalignment.  No acute lumbar spine fracture deformity or malalignment.  Chronic L2 and L3 mild compression fractures,  status post L2 cement augmentation. L4-5 PLIF.   Electronically Signed   By: Elon Alas M.D.   On: 02/21/2015 05:06   Ct Head Wo Contrast  02/21/2015   CLINICAL DATA:  Dizzy on way to bathroom, fell and hit back of head on dresser. Headache headaches and extreme lower cervical neck pain.  EXAM: CT HEAD WITHOUT CONTRAST  CT CERVICAL SPINE WITHOUT CONTRAST  TECHNIQUE: Multidetector CT imaging of the head and cervical spine was performed following the standard protocol without intravenous contrast. Multiplanar CT image reconstructions of the cervical spine were also generated.  COMPARISON:  CT head February 25, 2013  FINDINGS: CT HEAD FINDINGS  The ventricles and sulci are normal for age. No intraparenchymal hemorrhage, mass effect nor midline shift. Patchy supratentorial white matter hypodensities are within normal range for patient's age and though non-specific suggest sequelae of chronic small vessel ischemic disease. No acute large vascular territory infarcts.  No abnormal extra-axial fluid collections. Basal cisterns are patent.  Moderate calcific atherosclerosis of the carotid siphons.  No skull fracture. The included ocular globes and orbital contents are non-suspicious. Status post bilateral ocular lens implants. Trace paranasal sinus mucosal thickening without air-fluid levels. The mastoid air cells are well aerated. Patient is edentulous. Small amount of LEFT suboccipital scalp soft tissue stranding.  CT CERVICAL SPINE FINDINGS  Cervical vertebral bodies and posterior elements are intact. Grade 1 C7-T1 anterolisthesis without spondylolysis. C4-5 and C5-6 ACDF with solid interbody fusion, anterior plate and screw fixation appears intact, well seated without periprosthetic lucency. Severe C6-7 moderate to severe C7-T1 disc height loss, uncovertebral hypertrophy and endplate spurring compatible with degenerative disc, mild at C3-4. C1-2 articulation maintained with moderate arthropathy. Small amount of calcified pannus about the odontoid process can be seen with CPPD. No destructive bony lesions. Severe RIGHT C2-3 neural foraminal narrowing. Moderate to severe C7-T1 neural foraminal narrowing.  Scarring in the upper lobes of the lungs, partially imaged, with bullous changes. No prevertebral soft tissue swelling. Pacemaker wires noted. Moderate calcific atherosclerosis of the RIGHT carotid bulb.  IMPRESSION: CT HEAD: No acute intracranial process.  Stable chronic changes of the brain: Involutional changes and mild to moderate white matter small vessel ischemic disease.  CT CERVICAL SPINE: No acute cervical spine fracture. Grade 1 C7-T1 anterolisthesis on degenerative basis.  C4-5 and C5-6 solid fusion.   Electronically Signed   By: Elon Alas M.D.   On: 02/21/2015 05:50   Ct Cervical Spine Wo Contrast  02/25/2015   CLINICAL DATA:  Fall last Friday. Neck pain. History of spinal fusion.  EXAM: CT CERVICAL SPINE WITHOUT CONTRAST  TECHNIQUE: Multidetector CT imaging of the cervical spine was performed without intravenous contrast.  Multiplanar CT image reconstructions were also generated.  COMPARISON:  02/21/2015  FINDINGS: Changes of anterior fusion from C4-C6 with solid bony fusion. Degenerative disc disease throughout the cervical spine. Degenerative facet disease with grade 1 anterolisthesis of C7 on T1, stable. Prevertebral soft tissues are normal. No fracture. Diffuse degenerative facet disease. No epidural or paraspinal hematoma.  IMPRESSION: Degenerative disc and facet disease. Grade 1 anterolisthesis at at C7-T1.  Solid bony fusion from C4-C6 with anterior fusion plate.  No acute bony abnormality or change.   Electronically Signed   By: Rolm Baptise M.D.   On: 02/25/2015 15:33   Ct Cervical Spine Wo Contrast  02/21/2015   CLINICAL DATA:  Dizzy on way to bathroom, fell and hit back of head on dresser. Headache headaches and extreme lower cervical neck pain.  EXAM: CT HEAD WITHOUT CONTRAST  CT CERVICAL SPINE WITHOUT CONTRAST  TECHNIQUE: Multidetector CT imaging of the head and cervical spine was performed following the standard protocol without intravenous contrast. Multiplanar CT image reconstructions of the cervical spine were also generated.  COMPARISON:  CT head February 25, 2013  FINDINGS: CT HEAD FINDINGS  The ventricles and sulci are normal for age. No intraparenchymal hemorrhage, mass effect nor midline shift. Patchy supratentorial white matter hypodensities are within normal range for patient's age and though non-specific suggest sequelae of chronic small vessel ischemic disease. No acute large vascular territory infarcts.  No abnormal extra-axial fluid collections. Basal cisterns are patent. Moderate calcific atherosclerosis of the carotid siphons.  No skull fracture. The included ocular globes and orbital contents are non-suspicious. Status post bilateral ocular lens implants. Trace paranasal sinus mucosal thickening without air-fluid levels. The mastoid air cells are well aerated. Patient is edentulous. Small amount of  LEFT suboccipital scalp soft tissue stranding.  CT CERVICAL SPINE FINDINGS  Cervical vertebral bodies and posterior elements are intact. Grade 1 C7-T1 anterolisthesis without spondylolysis. C4-5 and C5-6 ACDF with solid interbody fusion, anterior plate and screw fixation appears intact, well seated without periprosthetic lucency. Severe C6-7 moderate to severe C7-T1 disc height loss, uncovertebral hypertrophy and endplate spurring compatible with degenerative disc, mild at C3-4. C1-2 articulation maintained with moderate arthropathy. Small amount of calcified pannus about the odontoid process can be seen with CPPD. No destructive bony lesions. Severe RIGHT C2-3 neural foraminal narrowing. Moderate to severe C7-T1 neural foraminal narrowing.  Scarring in the upper lobes of the lungs, partially imaged, with bullous changes. No prevertebral soft tissue swelling. Pacemaker wires noted. Moderate calcific atherosclerosis of the RIGHT carotid bulb.  IMPRESSION: CT HEAD: No acute intracranial process.  Stable chronic changes of the brain: Involutional changes and mild to moderate white matter small vessel ischemic disease.  CT CERVICAL SPINE: No acute cervical spine fracture. Grade 1 C7-T1 anterolisthesis on degenerative basis.  C4-5 and C5-6 solid fusion.   Electronically Signed   By: Elon Alas M.D.   On: 02/21/2015 05:50   Ct Thoracic Spine Wo Contrast  02/25/2015   CLINICAL DATA:  Neck and back pain post fall 1 week ago. Previous spinal fusion surgery.  EXAM: CT THORACIC SPINE WITHOUT CONTRAST  TECHNIQUE: Multidetector CT imaging of the thoracic spine was performed without intravenous contrast administration. Multiplanar CT image reconstructions were also generated.  COMPARISON:  02/21/2015 and earlier studies  FINDINGS: There is a mild compression fracture deformity of T6 with subtle cortical contour abnormality at the anterior and posterior margins of the vertebral body which were not evident on prior CT  chest of 01/07/2015. There is no significant retropulsion. Posterior elements intact. No additional acute bone abnormality. Additional nonacute findings:  C4-C6:  Anterior cervical fusion hardware  partially visualized.  C6-7:  moderate narrowing of the  interspace.  C7-T1: Asymmetric facet DJD allowing 4 mm anterolisthesis, as before.  T1 -3:  Moderate narrowing of interspaces.  T8-9: Vacuum phenomenon in the interspace.  Anterior osteophytes.  T9-T12: Anterior endplate osteophytes at all levels. Vacuum phenomenon T10-T12.  Extensive aortic calcifications. Left transvenous cardiac pacing leads are partially visualized.  IMPRESSION: 1. Mild compression fracture deformity of T6, new since 24/58/0998, without complicating features. 2. Degenerative and postop changes as above.   Electronically Signed   By: Lucrezia Europe M.D.   On: 02/25/2015 15:47   Ct Lumbar Spine Wo Contrast  02/25/2015   CLINICAL DATA:  Status post fall  with back pain.  EXAM: CT LUMBAR SPINE WITHOUT CONTRAST  TECHNIQUE: Multidetector CT imaging of the lumbar spine was performed without intravenous contrast administration. Multiplanar CT image reconstructions were also generated.  COMPARISON:  Spine radiographs dated 02/21/2015, CT of the abdomen dated 07/05/2014  FINDINGS: There is no evidence of fracture or subluxation of the lumbosacral spine. There has been a prior vertebroplasty of L2 vertebral body with stable approximately 30% height loss. Mild compression deformity of L3 vertebral bodies stable. There has been a prior L4-L5 laminectomy and posterior fusion, without evidence of hardware misalignment or other complications. There is mild levo convex lumbosacral spine scoliosis with associated multilevel moderate to severe osteoarthritic changes of the visualized spine.  Long segment of aneurysmal dilation of the aorta at the aorto-by-iliac bifurcation on the background of atherosclerotic disease is stable with maximum diameter of 3.1 cm.   IMPRESSION: No evidence of fracture subluxation of the lumbosacral spine.  Chronic findings of prior L4-L5 laminectomy and posterior fusion, and L2 vertebroplasty.  Multilevel osteoarthritic changes.  Known  distal abdominal aortic aneurysm, stable.   Electronically Signed   By: Fidela Salisbury M.D.   On: 02/25/2015 15:57   Dg Chest Port 1 View  02/24/2015   CLINICAL DATA:  Respiratory distress  EXAM: PORTABLE CHEST 1 VIEW  COMPARISON:  CT 01/07/2015  FINDINGS: LEFT-sided pacemaker overlies stable cardiac silhouette. There is chronic interstitial lung disease again noted most severe in the LEFT lower lobe and RIGHT upper lobe. No clear superimposed infection or edema.  IMPRESSION: Chronic interstitial lung disease similar to prior. Difficult to exclude superimposed infection or edema but no clear acute process identified.   Electronically Signed   By: Suzy Bouchard M.D.   On: 02/24/2015 14:13    Microbiology: Recent Results (from the past 240 hour(s))  Culture, Urine     Status: None   Collection Time: 02/21/15  1:22 PM  Result Value Ref Range Status   Specimen Description URINE, RANDOM  Final   Special Requests NONE  Final   Culture MULTIPLE SPECIES PRESENT, SUGGEST RECOLLECTION  Final   Report Status 02/22/2015 FINAL  Final  Surgical pcr screen     Status: Abnormal   Collection Time: 02/28/15  9:41 PM  Result Value Ref Range Status   MRSA, PCR NEGATIVE NEGATIVE Final   Staphylococcus aureus POSITIVE (A) NEGATIVE Final    Comment:        The Xpert SA Assay (FDA approved for NASAL specimens in patients over 45 years of age), is one component of a comprehensive surveillance program.  Test performance has been validated by [facility] for patients greater than or equal to 65 year old. It is not intended to diagnose infection nor to guide or monitor treatment.      Labs: Basic Metabolic Panel:  Recent Labs Lab 02/24/15 0420 03/01/15 0545  NA 136 136  K 4.6 5.0  CL 102  99*  CO2 30 28  GLUCOSE 103* 104*  BUN 16 26*  CREATININE 0.86 0.90  CALCIUM 9.0 9.2   Liver Function Tests: No results for input(s): AST, ALT, ALKPHOS, BILITOT, PROT, ALBUMIN in the last 168 hours. No results for input(s): LIPASE, AMYLASE in the last 168 hours. No results for input(s): AMMONIA in the last 168 hours. CBC:  Recent Labs Lab 02/24/15 0420 03/01/15 0545  WBC 13.7* 9.9  HGB 11.1* 11.5*  HCT 34.4* 35.3*  MCV 89.6 92.2  PLT 180 258   Cardiac Enzymes: No results for input(s):  CKTOTAL, CKMB, CKMBINDEX, TROPONINI in the last 168 hours. BNP: BNP (last 3 results)  Recent Labs  08/20/14 2155 02/24/15 1302  BNP 49.4 300.4*    ProBNP (last 3 results)  Recent Labs  12/31/14 0938  PROBNP 43.0    CBG:  Recent Labs Lab 03/01/15 1317  GLUCAP 104*       Signed:  VANN, JESSICA  [facility] 03/02/2015, 9:53 AM

## 2015-03-02 NOTE — Clinical Social Work Note (Signed)
Clinical Social Worker facilitated patient discharge including contacting patient family and facility to confirm patient discharge plans.  Clinical information faxed to facility and family agreeable with plan.  CSW arranged ambulance transport via [facility] to [name] at [place].  RN to call report prior to discharge.  DC packet prepared and on chart for transport with number for report.   Clinical Social Worker will sign off for now as social work intervention is no longer needed. Please consult [place] again if new need arises.  Glendon Axe, MSW, LCSWA [phone] 03/02/2015 12:29 PM

## 2015-03-02 NOTE — Clinical Social Work Placement (Signed)
   CLINICAL SOCIAL WORK PLACEMENT  NOTE  Date:  03/02/2015  Patient Details  Name: Peter Richards MRN: [id number] Date of Birth: 1928-10-07  Clinical Social Work is seeking post-discharge placement for this patient at the [place] level of care (*CSW will initial, date and re-position this form in  chart as items are completed):  Yes   Patient/family provided with [facility] Work Department's list of facilities offering this level of care within the geographic area requested by the patient (or if unable, by the patient's family).  Yes   Patient/family informed of their freedom to choose among providers that offer the needed level of care, that participate in Medicare, Medicaid or managed care program needed by the patient, have an available bed and are willing to accept the patient.  Yes   Patient/family informed of [facility]'s ownership interest in [facility] and [facility], as well as of the fact that they are under no obligation to receive care at these facilities.  PASRR submitted to EDS on       PASRR number received on       Existing PASRR number confirmed on 02/24/15     FL2 transmitted to all facilities in geographic area requested by pt/family on 02/24/15     FL2 transmitted to all facilities within larger geographic area on       Patient informed that his/her managed care company has contracts with or will negotiate with certain facilities, including the following:        Yes   Patient/family informed of bed offers received.  Patient chooses bed at  [facility] )     Physician recommends and patient chooses bed at      Patient to be transferred to  [facility] [place] ) on 03/02/15.  Patient to be transferred to facility by  [facility])     Patient family notified on 03/02/15 of transfer.  Name of family member notified:   (Pt's son present at bedside)     PHYSICIAN Please sign FL2, Please  sign DNR     Additional Comment:    _______________________________________________ Glendon Axe, MSW, LCSWA [phone] 03/02/2015 12:29 PM

## 2015-03-02 NOTE — Progress Notes (Signed)
ANTICOAGULATION CONSULT NOTE - Follow Up Consult  Pharmacy Consult for coumadin Indication: atrial fibrillation  Allergies  Allergen Reactions  . Codeine Nausea And Vomiting    Patient Measurements: Height: 5\' 8"  (172.7 cm) Weight: 167 lb 14.4 oz (76.159 kg) IBW/kg (Calculated) : 68.4  Vital Signs: Temp: 98 F (36.7 C) (09/28 0546) Temp Source: Oral (09/28 0546) BP: 111/58 mmHg (09/28 0945) Pulse Rate: 84 (09/28 0945)  Labs:  Recent Labs  02/28/15 0518 03/01/15 0545 03/02/15 0645  HGB  --  11.5*  --   HCT  --  35.3*  --   PLT  --  258  --   APTT  --  26  --   LABPROT 19.2* 16.6* 16.0*  INR 1.61* 1.33 1.26  CREATININE  --  0.90  --     Estimated Creatinine Clearance: 59.1 mL/min (by C-G formula based on Cr of 0.9).   Medications:  Scheduled:  . acetaminophen  1,000 mg Oral TID  . budesonide (PULMICORT) nebulizer solution  0.25 mg Nebulization BID  . calcitonin (salmon)  1 spray Alternating Nares Daily  . diclofenac sodium  4 g Topical QID  . escitalopram  20 mg Oral Daily  . feeding supplement (ENSURE ENLIVE)  237 mL Oral BID BM  . fentaNYL  62.5 mcg Transdermal Q72H  . furosemide  20 mg Oral Daily  . ipratropium-albuterol  3 mL Nebulization TID  . lidocaine  1 patch Transdermal Q24H  . methocarbamol  500 mg Oral TID  . metoprolol succinate  25 mg Oral Daily  . midodrine  5 mg Oral TID WC  . pregabalin  25 mg Oral QHS  . senna  2 tablet Oral QHS  . simvastatin  10 mg Oral QPM  . Warfarin - Pharmacist Dosing Inpatient   Does not apply q1800    Assessment: 81 yo male with s/p vertebroplasty and on coumadin PTA for afib. Coumadin restarted 9/27, INR today= 1.26.  Home coumadin: 5mg /day except 7.5mg  on MWF  Goal of Therapy:  INR 2-3 Monitor platelets by anticoagulation protocol: Yes   Plan:  -Coumadin 7.5mg  po today -Daily PT/INR  Hildred Laser, Pharm D 03/02/2015 11:02 AM

## 2015-03-09 ENCOUNTER — Telehealth: Payer: Self-pay | Admitting: *Deleted

## 2015-03-09 NOTE — Telephone Encounter (Signed)
Pt was scheduled for INR today, telephoned [facility] to discover pt had recently been in hospital for back fracture and admitted on 02/21/15 and  discharged on 03/02/15.  He is now back at [name] in [facility]. Per nurse, Darlin they monitor and manage INR/Comadin dosing for pt in Rehab. She was given our contact information to call us when he is discharged back to independent/assited living so we can resume Coumadin management.

## 2015-03-18 ENCOUNTER — Other Ambulatory Visit (HOSPITAL_COMMUNITY): Payer: Self-pay | Admitting: Interventional Radiology

## 2015-03-18 ENCOUNTER — Ambulatory Visit (INDEPENDENT_AMBULATORY_CARE_PROVIDER_SITE_OTHER): Payer: Commercial Managed Care - HMO | Admitting: Adult Health

## 2015-03-18 ENCOUNTER — Encounter: Payer: Self-pay | Admitting: Adult Health

## 2015-03-18 VITALS — BP 138/80 | HR 68 | Temp 98.0°F | Ht 68.0 in | Wt 160.0 lb

## 2015-03-18 DIAGNOSIS — J849 Interstitial pulmonary disease, unspecified: Secondary | ICD-10-CM

## 2015-03-18 DIAGNOSIS — J449 Chronic obstructive pulmonary disease, unspecified: Secondary | ICD-10-CM

## 2015-03-18 DIAGNOSIS — J9611 Chronic respiratory failure with hypoxia: Secondary | ICD-10-CM

## 2015-03-18 DIAGNOSIS — Z09 Encounter for follow-up examination after completed treatment for conditions other than malignant neoplasm: Secondary | ICD-10-CM

## 2015-03-18 NOTE — Patient Instructions (Addendum)
Continue on current regimen  Continue on Oxygen 2l/m rest  May use 3l/m with activity .  Follow up [name] in 1 month as planned and As needed

## 2015-03-21 ENCOUNTER — Ambulatory Visit (INDEPENDENT_AMBULATORY_CARE_PROVIDER_SITE_OTHER): Payer: Commercial Managed Care - HMO | Admitting: Internal Medicine

## 2015-03-21 DIAGNOSIS — Z5181 Encounter for therapeutic drug level monitoring: Secondary | ICD-10-CM

## 2015-03-21 DIAGNOSIS — I480 Paroxysmal atrial fibrillation: Secondary | ICD-10-CM

## 2015-03-21 LAB — POCT INR: INR: 7

## 2015-03-21 LAB — PROTIME-INR: INR: 1 (ref 0.9–1.1)

## 2015-03-22 LAB — PROTIME-INR: INR: 4 — AB (ref 0.9–1.1)

## 2015-03-22 LAB — POCT INR: INR: 5.3

## 2015-03-22 NOTE — Progress Notes (Signed)
INR venipuncture on 03/22/15 1.05/PT 13.8, POCT INR 7.0.  Concerned either machine or lab inaccurate.  Spoke with Nira Conn, RN at [facility] requested INR be checked again on POCT machine INR 5.3, also requested STAT venipuncture be done again to try and correlate reading from labs and/or POCT machine.  Pt held Coumadin last night per our instructions and will continue to hold until STAT INR received back from lab today.

## 2015-03-23 ENCOUNTER — Ambulatory Visit (INDEPENDENT_AMBULATORY_CARE_PROVIDER_SITE_OTHER): Payer: Commercial Managed Care - HMO | Admitting: Pharmacist

## 2015-03-23 DIAGNOSIS — I480 Paroxysmal atrial fibrillation: Secondary | ICD-10-CM

## 2015-03-23 DIAGNOSIS — Z5181 Encounter for therapeutic drug level monitoring: Secondary | ICD-10-CM

## 2015-03-24 ENCOUNTER — Encounter (HOSPITAL_COMMUNITY): Payer: Self-pay | Admitting: General Practice

## 2015-03-24 ENCOUNTER — Emergency Department (HOSPITAL_COMMUNITY): Payer: Commercial Managed Care - HMO

## 2015-03-24 ENCOUNTER — Emergency Department (HOSPITAL_COMMUNITY)
Admission: EM | Admit: 2015-03-24 | Discharge: 2015-03-24 | Disposition: A | Discharge status: Home / Self Care | Payer: Commercial Managed Care - HMO | Attending: Physician Assistant | Admitting: Physician Assistant

## 2015-03-24 DIAGNOSIS — Z79899 Other long term (current) drug therapy: Secondary | ICD-10-CM | POA: Insufficient documentation

## 2015-03-24 DIAGNOSIS — E785 Hyperlipidemia, unspecified: Secondary | ICD-10-CM | POA: Insufficient documentation

## 2015-03-24 DIAGNOSIS — I2510 Atherosclerotic heart disease of native coronary artery without angina pectoris: Secondary | ICD-10-CM | POA: Diagnosis not present

## 2015-03-24 DIAGNOSIS — R42 Dizziness and giddiness: Secondary | ICD-10-CM | POA: Insufficient documentation

## 2015-03-24 DIAGNOSIS — Z7951 Long term (current) use of inhaled steroids: Secondary | ICD-10-CM | POA: Insufficient documentation

## 2015-03-24 DIAGNOSIS — J449 Chronic obstructive pulmonary disease, unspecified: Secondary | ICD-10-CM | POA: Insufficient documentation

## 2015-03-24 DIAGNOSIS — F329 Major depressive disorder, single episode, unspecified: Secondary | ICD-10-CM | POA: Diagnosis not present

## 2015-03-24 DIAGNOSIS — Z8546 Personal history of malignant neoplasm of prostate: Secondary | ICD-10-CM | POA: Diagnosis not present

## 2015-03-24 DIAGNOSIS — Z8701 Personal history of pneumonia (recurrent): Secondary | ICD-10-CM | POA: Insufficient documentation

## 2015-03-24 DIAGNOSIS — I4891 Unspecified atrial fibrillation: Secondary | ICD-10-CM | POA: Insufficient documentation

## 2015-03-24 DIAGNOSIS — Z8673 Personal history of transient ischemic attack (TIA), and cerebral infarction without residual deficits: Secondary | ICD-10-CM | POA: Diagnosis not present

## 2015-03-24 DIAGNOSIS — F419 Anxiety disorder, unspecified: Secondary | ICD-10-CM | POA: Diagnosis not present

## 2015-03-24 DIAGNOSIS — Z79891 Long term (current) use of opiate analgesic: Secondary | ICD-10-CM | POA: Insufficient documentation

## 2015-03-24 DIAGNOSIS — Z951 Presence of aortocoronary bypass graft: Secondary | ICD-10-CM | POA: Diagnosis not present

## 2015-03-24 DIAGNOSIS — I951 Orthostatic hypotension: Secondary | ICD-10-CM | POA: Diagnosis not present

## 2015-03-24 DIAGNOSIS — R531 Weakness: Secondary | ICD-10-CM | POA: Diagnosis present

## 2015-03-24 DIAGNOSIS — Z791 Long term (current) use of non-steroidal anti-inflammatories (NSAID): Secondary | ICD-10-CM | POA: Insufficient documentation

## 2015-03-24 DIAGNOSIS — G8929 Other chronic pain: Secondary | ICD-10-CM | POA: Insufficient documentation

## 2015-03-24 DIAGNOSIS — I252 Old myocardial infarction: Secondary | ICD-10-CM | POA: Insufficient documentation

## 2015-03-24 DIAGNOSIS — Z7901 Long term (current) use of anticoagulants: Secondary | ICD-10-CM | POA: Insufficient documentation

## 2015-03-24 DIAGNOSIS — Z85828 Personal history of other malignant neoplasm of skin: Secondary | ICD-10-CM | POA: Insufficient documentation

## 2015-03-24 DIAGNOSIS — Z950 Presence of cardiac pacemaker: Secondary | ICD-10-CM | POA: Diagnosis not present

## 2015-03-24 DIAGNOSIS — Z8551 Personal history of malignant neoplasm of bladder: Secondary | ICD-10-CM | POA: Insufficient documentation

## 2015-03-24 DIAGNOSIS — M810 Age-related osteoporosis without current pathological fracture: Secondary | ICD-10-CM | POA: Insufficient documentation

## 2015-03-24 DIAGNOSIS — Z8719 Personal history of other diseases of the digestive system: Secondary | ICD-10-CM | POA: Insufficient documentation

## 2015-03-24 DIAGNOSIS — I10 Essential (primary) hypertension: Secondary | ICD-10-CM | POA: Insufficient documentation

## 2015-03-24 DIAGNOSIS — Z87891 Personal history of nicotine dependence: Secondary | ICD-10-CM | POA: Insufficient documentation

## 2015-03-24 HISTORY — DX: Pneumonia, unspecified organism: J18.9

## 2015-03-24 LAB — URINALYSIS, ROUTINE W REFLEX MICROSCOPIC
BILIRUBIN URINE: NEGATIVE
Glucose, UA: NEGATIVE mg/dL
HGB URINE DIPSTICK: NEGATIVE
Ketones, ur: NEGATIVE mg/dL
Nitrite: POSITIVE — AB
PH: 7 (ref 5.0–8.0)
Protein, ur: NEGATIVE mg/dL
SPECIFIC GRAVITY, URINE: 1.02 (ref 1.005–1.030)
Urobilinogen, UA: 0.2 mg/dL (ref 0.0–1.0)

## 2015-03-24 LAB — CBC
HEMATOCRIT: 33.4 % — AB (ref 39.0–52.0)
HEMOGLOBIN: 11.2 g/dL — AB (ref 13.0–17.0)
MCH: 30.5 pg (ref 26.0–34.0)
MCHC: 33.5 g/dL (ref 30.0–36.0)
MCV: 91 fL (ref 78.0–100.0)
Platelets: 179 10*3/uL (ref 150–400)
RBC: 3.67 MIL/uL — ABNORMAL LOW (ref 4.22–5.81)
RDW: 17 % — AB (ref 11.5–15.5)
WBC: 16.9 10*3/uL — ABNORMAL HIGH (ref 4.0–10.5)

## 2015-03-24 LAB — BASIC METABOLIC PANEL
ANION GAP: 7 (ref 5–15)
BUN: 36 mg/dL — AB (ref 6–20)
CHLORIDE: 106 mmol/L (ref 101–111)
CO2: 25 mmol/L (ref 22–32)
Calcium: 8.8 mg/dL — ABNORMAL LOW (ref 8.9–10.3)
Creatinine, Ser: 1.07 mg/dL (ref 0.61–1.24)
GFR calc Af Amer: >60 mL/min (ref >60)
GFR calc non Af Amer: >60 mL/min (ref >60)
GLUCOSE: 112 mg/dL — AB (ref 65–99)
POTASSIUM: 4.4 mmol/L (ref 3.5–5.1)
Sodium: 138 mmol/L (ref 135–145)

## 2015-03-24 LAB — URINE MICROSCOPIC-ADD ON

## 2015-03-24 LAB — I-STAT TROPONIN, ED: TROPONIN I, POC: 0 ng/mL (ref 0.00–0.08)

## 2015-03-24 MED ORDER — SODIUM CHLORIDE 0.9 % IV BOLUS (SEPSIS)
1000.0000 mL | Freq: Once | INTRAVENOUS | Status: AC
  Administered 2015-03-24: 1000 mL via INTRAVENOUS

## 2015-03-24 NOTE — Assessment & Plan Note (Signed)
Continue on current regimen  Continue on Oxygen 2l/m rest  May use 3l/m with activity .  Follow up [name] in 1 month as planned and As needed

## 2015-03-24 NOTE — ED Notes (Signed)
Pt presents to the ED after a fall at [facility] living. Pt had a fall this morning. Pt normally uses 2L N/C. Pts tank was empty when EMS arrived, and his oxygen saturation level was 90%. When EMS placed pt on oxygen his saturation level increased to 97%. EMS sat pt up and pt felt dizzy. Pt currently not having any complaints or pain. Pt is on coumadin. Pt is A/O. EMS gave pt 500 cc of NS

## 2015-03-24 NOTE — Progress Notes (Signed)
Subjective:    Patient ID: Peter Richards, male    DOB: 07/26/1928, 81 y.o.   MRN: [id number]  HPI 81 yo man, former smoker (150 pk-yrs), hx CAD/CABG ('02), A Fib on amiodarone for the last 4 years, was stopped 1 month ago, followed by Dr Mare Ferrari. He has been experiencing progressive exertional SOB for over 3 months such that he had to stop to rest with walking about 75 feet. He has also had some exertional CP that has responded to NTG, but the episodes of dyspnea haven't always occurred at same time as the CP. He has a cough, bothering him for about a year. He hears wheezing at times, seems to come at random, not necessarily with exertion.  Hurts sometimes to take a deep breath. Denies any sx or RA, SLE, etc.   ROV 06/10/12 -- follows up for dyspnea, presumed to be multifactorial. He has ILD on CT scan chest as well as obstruction on PFT. We started Spiriva last time to see if he would benefit >> he believes that he is less SOB. He hasn't exerted significantly, but his usual daily activities are easier. His wheeze is better. Last time he desaturated with ambulation, but wanted to defer. He has intermittent aspiration sx >> swallow precautions recommended 04/2011: 1. Consider esophageal assessment 2. Regular textures & Thin liquids 3. Compensations: Hard cough after swallow 4. Upright 30-60 min after meal;Seated upright 90 degrees  ROV 12/11/13 -- follow up visit for mixed disease > COPD + ILD on CT scan, suspected related to aspiration and silica exposure. He has been experiencing more dyspnea over the last 2-3 months. He has a new persistent cough associated with mid-chest discomfort. This is mid-sternal, intermittent. Has been associated with exertion, with his cough. No change with meals. He sees Dr Mare Ferrari for A fib / pacer, but has not talked to him about the CP. Pt has only taking Spiriva prn, about every week.  >stop Spiriva  Follow up 01/04/14 --  Pt was seen last month for follow up for  COPD and ILD(suspected aspiration and silica exposure)  Repeat CT chest done on 7/22 showed progressed ILD changes .  We reveiwed these results in detail and questions were answered .  Previous PFT showed mixed disease in 2013.  Says he gets winded with walking and has to stop and rest.  Today in office ambulatory walk showed drop in sats 85% on RA. At rest 92% on RA.  We discussed his option and he agrees to begin O2 with activity and. At bedtime .  He remains on Spiriva daily .  No hemoptysis ,orthopnea, exertional chest pain , edema or fever. No flare of cough.   ROV 79/2/15 -- follow up for ILD and COPD. PFT done 9/2 > mixed disease, FEV1 67% pred. We had attempted stopping Spiriva at one point. He hasn't seen Dr Mare Ferrari yet about his CP > happens with exertion, whenever he is dyspneic. He started O2 last month > believes it has helped his dyspnea and CP.  He stopped taking the spiriva last month when he started the oxygen.  He uses prn and rarely.  He was on nexium for a week for cough.> stopped it, wonders about restarting.   ROV 07/13/14 -- follows for his hx ILD and COPD. Last time we added back Spiriva once a day, but he is only using occasionally prn.  Was concerned that he was having exertional chest pain. He has followed with [name] and a  stress test showed no reversible ischemia in 03/03/14. He continues to have CP with a deep breath, not necessarily exertion. He is using O2 at night, but using during the day prn.   ROV 10/12/14 -- follow-up for interstitial lung disease and COPD. Her last visit we discussed taking his Spiriva every day reliably. He He has stable CP with a deep breathing, his exertional SOB is worse than last visit.  He is on 2L/min at all times (? Whether this is enough). He takes albuterol (? Duonebs) about once a day, doesn't notice much benefit.   ROV 11/16/14 -- follow-up visit for COPD and interstitial lung disease with associated hypoxemia. At our last visit I  treated him with another week of levofloxacin to ensure that he did not have any residual pneumonia superimposed on his interstitial scarring. We also discussed taking Spiriva reliably every day. He has not been using his O2 reliably with exertion - finds it more of a burden than a help. He restarted spiriva, he believes it has been helpful. He uses SABA every other day. Needs refill on the SABA.   03/18/15  Follow up [name] -COPD /ILD with chronic resp failure on O2 Pt presents for follow up visit.    Overall says that he is doing okay. Does get winded with activity and has occasional cough and congestion mainly clear to white mucus. Denies any chest pain, orthopnea, PND, leg swelling or hemoptysis. Remains on oxygen 2 L at rest and uses 3 L with activity. Last seen in office was discussed starting OFEV/Esbriet for ILD however he is declines at this time.      ROS Constitutional:   No  weight loss, night sweats,  Fevers, chills,  +fatigue, or  lassitude.  HEENT:   No headaches,  Difficulty swallowing,  Tooth/dental problems, or  Sore throat,                No sneezing, itching, ear ache, nasal congestion, post nasal drip,   CV:  No chest pain,  Orthopnea, PND, swelling in lower extremities, anasarca, dizziness, palpitations, syncope.   GI  No heartburn, indigestion, abdominal pain, nausea, vomiting, diarrhea, change in bowel habits, loss of appetite, bloody stools.   Resp:    No chest wall deformity  Skin: no rash or lesions.  GU: no dysuria, change in color of urine, no urgency or frequency.  No flank pain, no hematuria   MS:  No joint pain or swelling.  No decreased range of motion.  No back pain.  Psych:  No change in mood or affect. No depression or anxiety.  No memory loss.          Objective:   Physical Exam    Gen: Pleasant, well-nourished, in no distress,  normal affect  ENT: No lesions,  mouth clear,  oropharynx clear, no postnasal drip  Neck: No JVD, no  TMG, no carotid bruits  Lungs: No use of accessory muscles, distant, B insp crackles, L > R  Cardiovascular: RRR, heart sounds normal, no murmur or gallops, no peripheral edema  Musculoskeletal: No deformities, no cyanosis or clubbing,   Neuro: alert, non focal  Skin: Warm, no lesions or rashes   Data:  PFT were performed  04/09/12>> show mixed disease, no BD response, restricted volumes, decreased DLCO that corrects for Va Repeated 02/03/14 >> mixed disease, restricted volumes, decreased DLCO   CT chest 12/23/13 >Mediastinal lymph nodes measure up to 1 mm in AP window. There is a  larger lymph node in the lower right paratracheal station which is  predominantly made up of a fatty hilum. Hilar regions are difficult  to definitively evaluate without IV contrast. No axillary  adenopathy. Atherosclerotic calcification of the arterial  vasculature. Heart is at the upper limits of normal in size. No  pericardial effusion.  There is a peripheral and basilar predominant pattern of subpleural  reticulation, traction bronchiectasis/bronchiolectasis and mild  associated architectural distortion. There may be honeycombing in  the lower lobes. Findings have progressed in the lung bases when  compared with 04/11/2012. No air trapping. No pleural fluid. Debris  is seen dependently in the airway.  Incidental imaging of the upper abdomen shows the visualized  portions of the liver, adrenal glands, kidneys, spleen, pancreas and  stomach to be grossly unremarkable. No upper abdominal adenopathy.  No worrisome lytic or sclerotic lesions. Degenerative changes are  seen in the spine.  IMPRESSION:  Peripheral and basilar predominant pattern of subpleural  reticulation, traction bronchiectasis/ bronchiolectasis and  associated architectural distortion. Findings have progressed from  04/11/2012 and indicative of usual interstitial pneumonitis (UIP).      Assessment & Plan:

## 2015-03-24 NOTE — Assessment & Plan Note (Addendum)
  Continue on current regimen  Continue on Oxygen 2l/m rest  May use 3l/m with activity .  Follow up [name] in 1 month as planned and As needed

## 2015-03-24 NOTE — Discharge Instructions (Signed)
Please only take the fentanyl patches as prescribed. Please follow up with your regular doctor as soon as possible and drink plenty of fluid.   Hypotension As your heart beats, it forces blood through your arteries. This force is your blood pressure. If your blood pressure is too low for you to go about your normal activities or to support the organs of your body, you have hypotension. Hypotension is also referred to as low blood pressure. When your blood pressure becomes too low, you may not get enough blood to your brain. As a result, you may feel weak, feel lightheaded, or develop a rapid heart rate. In a more severe case, you may faint. CAUSES Various conditions can cause hypotension. These include:  Blood loss.  Dehydration.  Heart or endocrine problems.  Pregnancy.  Severe infection.  Not having a well-balanced diet filled with needed nutrients.  Severe allergic reactions (anaphylaxis). Some medicines, such as blood pressure medicine or water pills (diuretics), may lower your blood pressure below normal. Sometimes taking too much medicine or taking medicine not as directed can cause hypotension. TREATMENT  Hospitalization is sometimes required for hypotension if fluid or blood replacement is needed, if time is needed for medicines to wear off, or if further monitoring is needed. Treatment might include changing your diet, changing your medicines (including medicines aimed at raising your blood pressure), and use of support stockings. HOME CARE INSTRUCTIONS   Drink enough fluids to keep your urine clear or pale yellow.  Take your medicines as directed by your health care provider.  Get up slowly from reclining or sitting positions. This gives your blood pressure a chance to adjust.  Wear support stockings as directed by your health care provider.  Maintain a healthy diet by including nutritious food, such as fruits, vegetables, nuts, whole grains, and lean meats. SEEK MEDICAL  CARE IF:  You have vomiting or diarrhea.  You have a fever for more than 2-3 days.  You feel more thirsty than usual.  You feel weak and tired. SEEK IMMEDIATE MEDICAL CARE IF:   You have chest pain or a fast or irregular heartbeat.  You have a loss of feeling in some part of your body, or you lose movement in your arms or legs.  You have trouble speaking.  You become sweaty or feel lightheaded.  You faint. MAKE SURE YOU:   Understand these instructions.  Will watch your condition.  Will get help right away if you are not doing well or get worse.   This information is not intended to replace advice given to you by your health care provider. Make sure you discuss any questions you have with your health care provider.   Document Released: 05/21/2005 Document Revised: 03/11/2013 Document Reviewed: 11/21/2012 Elsevier Interactive Patient Education 2016 Elsevier Inc.  Orthostatic Hypotension Orthostatic hypotension is a sudden drop in blood pressure. It happens when you quickly stand up from a seated or lying position. You may feel dizzy or light-headed. This can last for just a few seconds or for up to a few minutes. It is usually not a serious problem. However, if this happens frequently or gets worse, it can be a sign of something more serious. CAUSES  Different things can cause orthostatic hypotension, including:   Loss of body fluids (dehydration).  Medicines that lower blood pressure.  Sudden changes in posture, such as standing up quickly after you have been sitting or lying down.  Taking too much of your medicine. SIGNS AND SYMPTOMS  Light-headedness or dizziness.   Fainting or near-fainting.   A fast heart rate.   Weakness.   Feeling tired (fatigue).  DIAGNOSIS  Your health care provider may do several things to help diagnose your condition and identify the cause. These may include:   Taking a medical history and doing a physical exam.  Checking  your blood pressure. Your health care provider will check your blood pressure when you are:  Lying down.  Sitting.  Standing.  Using tilt table testing. In this test, you lie down on a table that moves from a lying position to a standing position. You will be strapped onto the table. This test monitors your blood pressure and heart rate when you are in different positions. TREATMENT  Treatment will vary depending on the cause. Possible treatments include:   Changing the dosage of your medicines.  Wearing compression stockings on your lower legs.  Standing up slowly after sitting or lying down.  Eating more salt.  Eating frequent, small meals.  In some cases, getting IV fluids.  Taking medicine to enhance fluid retention. HOME CARE INSTRUCTIONS  Only take over-the-counter or prescription medicines as directed by your health care provider.  Follow your health care provider's instructions for changing the dosage of your current medicines.  Do not stop or adjust your medicine on your own.  Stand up slowly after sitting or lying down. This allows your body to adjust to the different position.  Wear compression stockings as directed.  Eat extra salt as directed.  Do not add extra salt to your diet unless directed to by your health care provider.  Eat frequent, small meals.  Avoid standing suddenly after eating.  Avoid hot showers or excessive heat as directed by your health care provider.  Keep all follow-up appointments. SEEK MEDICAL CARE IF:  You continue to feel dizzy or light-headed after standing.  You feel groggy or confused.  You feel cold, clammy, or sick to your stomach (nauseous).  You have blurred vision.  You feel short of breath. SEEK IMMEDIATE MEDICAL CARE IF:   You faint after standing.  You have chest pain.  You have difficulty breathing.   You lose feeling or movement in your arms or legs.   You have slurred speech or difficulty  talking, or you are unable to talk.  MAKE SURE YOU:   Understand these instructions.  Will watch your condition.  Will get help right away if you are not doing well or get worse.   This information is not intended to replace advice given to you by your health care provider. Make sure you discuss any questions you have with your health care provider.   Document Released: 05/11/2002 Document Revised: 05/26/2013 Document Reviewed: 03/13/2013 Elsevier Interactive Patient Education [employer].

## 2015-03-24 NOTE — ED Notes (Addendum)
  Patient had three fentanyl patches on (12 mcg/hr).  Removed these patches.  Patient is lethargic.   Patches disposed of in sharps container.  MD informed.

## 2015-03-24 NOTE — ED Provider Notes (Addendum)
CSN: [id number]     Arrival date & time 03/24/15  1223 History   First MD Initiated Contact with Patient 03/24/15 1232     Chief Complaint  Patient presents with  . Fall     (Consider location/radiation/quality/duration/timing/severity/associated sxs/prior Treatment) HPI  Patient is an 81 year old male with past medical history significant for pericardial, syncope, CAD, hypertension, orthostatic hypotension.  Recently discharged from [facility] after fall and workup for orthostatic hypotension. Patient on midodrine.  Patient had consult for palliative care as well during this admission.  Patient took his normal medications and then stood up and became weak and had to be lowered to the floor. Importantly it is well documented patient has a long history of orthostatic hypotension. He's been worked up several times for this.   Past Medical History  Diagnosis Date  . Hyperlipidemia   . Orthostatic hypotension   . Bradycardia   . Syncope     s/p PTVP  . Myocardial infarction ([place])     1981  . CAD (coronary artery disease)     prior CABG in 2002 negative Myoview in 2009  . Hypertension   . Stroke [facility])     TIA  2007    . GERD (gastroesophageal reflux disease)   . Headache(784.0)   . Arthritis     OSTEOPOROSIS  . Aortic stenosis, mild     by 01/08/08 echo  . Chronic anticoagulation   . Depression   . AAA (abdominal aortic aneurysm) ([place])   . Heart attack ([place])   . Pacemaker     medtronic pacer  . History of cervical spinal surgery     [place]  [name] HOH (hard of hearing)     BILATERAL HEARING AIDS  . Prostate cancer ([place])   . Basal cell adenocarcinoma     'bottom lip" (11/06/2012)  . Bladder cancer ([place])   . Attention to urostomy [facility])     "since 2006" (11/06/2012)  . Atrial fibrillation ([place])     on amiodarone and has PTVP in place  . Exertional shortness of breath   . Chronic radicular pain of lower back   . Anxiety   . COPD (chronic obstructive pulmonary disease) ([place])    . Pneumonia    Past Surgical History  Procedure Laterality Date  . Insert / replace / remove pacemaker      11/2009  DR BRODIE    . Prostatectomy      BLADDER ALSO REMOVED (OSTOMY BAG)  2006  FOR CANCER     . Abdominal aortic aneurysm repair  1999    STENT PLACED    . Cataract extraction w/ intraocular lens  implant, bilateral    . Appendectomy      1959  . Revision urostomy cutaneous    . Revision urostomy cutaneous  2006    Urostomy bag placed on right lower abdomen  . Vertebroplasty  08/22/2011    Procedure: VERTEBROPLASTY;  Surgeon: Winfield Cunas, MD;  Location: [facility];  Service: Neurosurgery;  Laterality: N/A;  Lumbar Two vertebroplasty  . Cholecystectomy      4 YRS AGO  . Posterior lumbar fusion  11/06/2012  . Coronary artery bypass graft  1999    CABG X2  . Skin cancer excision      "bottom lip" (11/06/2012)  . Bladder surgery      "for cancer" (11/06/2012)  . Lumbar laminectomy/decompression microdiscectomy Bilateral 11/06/2012    Procedure: LUMBAR LAMINECTOMY/DECOMPRESSION MICRODISCECTOMY;  Surgeon: Lawrence Santiago  Dumonski, MD;  Location: [facility];  Service: Orthopedics;  Laterality: Bilateral;  Lumbar 2-5 decompression   Family History  Problem Relation Age of Onset  . Cancer Father     stomach  . Cancer Sister     lung  . Heart disease Sister   . Hypertension Sister   . Heart disease Mother   . Hypertension Mother   . Heart attack Neg Hx   . Stroke Neg Hx    Social History  Substance Use Topics  . Smoking status: Former Smoker -- 3.00 packs/day for 50 years    Types: Cigarettes    Quit date: 06/04/1978  . Smokeless tobacco: Current User    Types: Snuff  . Alcohol Use: No     Comment: rare "once a month"    Review of Systems  Constitutional: Negative for fever and activity change.  HENT: Negative for drooling and hearing loss.   Eyes: Negative for discharge and redness.  Respiratory: Negative for cough and shortness of breath.   Cardiovascular: Negative  for chest pain.  Gastrointestinal: Negative for abdominal pain.  Genitourinary: Negative for dysuria and urgency.  Musculoskeletal: Negative for arthralgias.  Allergic/Immunologic: Negative for immunocompromised state.  Neurological: Positive for light-headedness. Negative for seizures and weakness.  Psychiatric/Behavioral: Negative for behavioral problems and confusion.  All other systems reviewed and are negative.     Allergies  Codeine  Home Medications   Prior to Admission medications   Medication Sig Start Date End Date Taking? Authorizing Provider  acetaminophen (TYLENOL) 500 MG tablet Take 2 tablets (1,000 mg total) by mouth 3 (three) times daily. 03/02/15  Yes Geradine Girt, DO  albuterol (PROVENTIL HFA;VENTOLIN HFA) 108 (90 BASE) MCG/ACT inhaler Inhale 2 puffs into the lungs every 6 (six) hours as needed for wheezing or shortness of breath. 11/16/14  Yes Collene Gobble, MD  budesonide-formoterol (SYMBICORT) 160-4.5 MCG/ACT inhaler Inhale 2 puffs into the lungs 2 (two) times daily. 12/27/14  Yes Tammy S Parrett, NP  calcitonin, salmon, (MIACALCIN/FORTICAL) 200 UNIT/ACT nasal spray Place 1 spray into alternate nostrils daily. 03/02/15  Yes Geradine Girt, DO  diclofenac sodium (VOLTAREN) 1 % GEL Apply 4 g topically 4 (four) times daily. 03/02/15  Yes Jessica U Vann, DO  escitalopram (LEXAPRO) 20 MG tablet Take 20 mg by mouth daily.   Yes Historical Provider, MD  fentaNYL (DURAGESIC - DOSED MCG/HR) 12 MCG/HR Place 5 patches (62.5 mcg total) onto the skin every 3 (three) days. Patient taking differently: Place 24 mcg onto the skin every 3 (three) days.  03/02/15  Yes Geradine Girt, DO  furosemide (LASIX) 20 MG tablet Take 1 tablet (20 mg total) by mouth daily as needed (increased shortness of breath). 12/17/14  Yes Darlin Coco, MD  ipratropium-albuterol (DUONEB) 0.5-2.5 (3) MG/3ML SOLN Take 3 mLs by nebulization every 4 (four) hours as needed. 08/24/14  Yes Theodis Blaze, MD   methocarbamol (ROBAXIN) 500 MG tablet Take 1 tablet (500 mg total) by mouth 3 (three) times daily. 03/02/15  Yes Geradine Girt, DO  metoprolol succinate (TOPROL-XL) 25 MG 24 hr tablet Take 1 tablet (25 mg total) by mouth daily. 03/02/15  Yes Geradine Girt, DO  midodrine (PROAMATINE) 5 MG tablet Take 1 tablet (5 mg total) by mouth 3 (three) times daily with meals. 03/02/15  Yes Geradine Girt, DO  pregabalin (LYRICA) 25 MG capsule Take 1 capsule (25 mg total) by mouth at bedtime. 03/02/15  Yes Geradine Girt, DO  PRESCRIPTION MEDICATION Wears 2 L of O2 continous   Yes Historical Provider, MD  senna (SENOKOT) 8.6 MG TABS tablet Take 2 tablets (17.2 mg total) by mouth at bedtime. 03/02/15  Yes Geradine Girt, DO  simvastatin (ZOCOR) 10 MG tablet Take 10 mg by mouth every evening.   Yes Historical Provider, MD  warfarin (COUMADIN) 5 MG tablet Take 5-7.5 mg by mouth every evening. Take 7.5mg  on Monday, Wednesday and Friday. Take 5mg  all other days of the week. 10/06/13  Yes Belkys A Regalado, MD  alendronate (FOSAMAX) 70 MG tablet Take 1 tablet by mouth once a week. 12/24/14   Historical Provider, MD  bisacodyl (DULCOLAX) 10 MG suppository Place 1 suppository (10 mg total) rectally daily as needed for moderate constipation. Patient not taking: Reported on 03/24/2015 03/02/15   Geradine Girt, DO  feeding supplement, ENSURE ENLIVE, (ENSURE ENLIVE) LIQD Take 237 mLs by mouth 2 (two) times daily between meals. Patient not taking: Reported on 03/24/2015 03/02/15   Geradine Girt, DO  morphine (MSIR) 15 MG tablet Take 1 tablet (15 mg total) by mouth every 2 (two) hours as needed (BREAKTHROUGH PAIN). Patient not taking: Reported on 03/24/2015 03/02/15   Tomi Bamberger Vann, DO   BP 144/67 mmHg  Pulse 59  Temp(Src) 97.4 F (36.3 C) (Oral)  Resp 14  SpO2 99% Physical Exam  Constitutional: He is oriented to person, place, and time. He appears well-nourished.  HENT:  Head: Normocephalic.  Mouth/Throat: Oropharynx is  clear and moist.  Eyes: Conjunctivae are normal.  Neck: No tracheal deviation present.  Cardiovascular: Normal rate.   Pulmonary/Chest: Effort normal. No stridor. No respiratory distress.  Abdominal: Soft. There is no tenderness. There is no guarding.  Musculoskeletal: Normal range of motion. He exhibits no edema.  Neurological: He is oriented to person, place, and time. No cranial nerve deficit.  Skin: Skin is warm and dry. No rash noted. He is not diaphoretic.  Psychiatric: He has a normal mood and affect. His behavior is normal.  Nursing note and vitals reviewed.   ED Course  Procedures (including critical care time) Labs Review Labs Reviewed  BASIC METABOLIC PANEL - Abnormal; Notable for the following:    Glucose, Bld 112 (*)    BUN 36 (*)    Calcium 8.8 (*)    All other components within normal limits  CBC - Abnormal; Notable for the following:    WBC 16.9 (*)    RBC 3.67 (*)    Hemoglobin 11.2 (*)    HCT 33.4 (*)    RDW 17.0 (*)    All other components within normal limits  URINE CULTURE  URINALYSIS, ROUTINE W REFLEX MICROSCOPIC (NOT AT [facility])  CBG MONITORING, ED  Randolm Idol, ED    Imaging Review Dg Chest 2 View  03/24/2015  CLINICAL DATA:  Chest pain EXAM: CHEST  2 VIEW COMPARISON:  02/24/2015 FINDINGS: Coarse interstitial opacities with low lung volumes and stable asymmetric involvement on the left. No superimposed pneumonia or edema suspected. Stable cardiomegaly. Dual-chamber pacer leads from the left are unremarkable. Status post CABG. Mid thoracic and partially visualized lumbar compression fractures with vertebroplasty. No osseous findings to explain acute chest pain. IMPRESSION: Pulmonary fibrosis without evidence of acute superimposed disease. Electronically Signed   By: Monte Fantasia M.D.   On: 03/24/2015 14:37   I have personally reviewed and evaluated these images and lab results as part of my medical decision-making.   EKG  Interpretation   Date/Time:  Thursday March 24 2015 12:24:08 EDT Ventricular Rate:  62 PR Interval:  136 QRS Duration: 83 QT Interval:  438 QTC Calculation: 445 R Axis:   -14 Text Interpretation:  Atrial-paced rhythm Inferior infarct, old Lateral  infarct, acute (LAD) Paced rythm no sign ischemia Confirmed by Gerald Leitz ([id number]) on 03/24/2015 3:19:38 PM      MDM   Final diagnoses:  None    Patient is an 81 year old male with known past medical history for heart orthostatic hypotensive pacemaker, aortic stenosis, CAD hypertension hyperlipidemia and A. fib presenting today with orthostatic hypotension. Patient stood up too quickly after taking his medications and fell down. Of note patient had 3 fentanyl patches on on arrival. Patient reports that he was supposed to be taken off the 50 ug fentanyl and switch to to 12 g. He was found to 3 12 g on today.  Patient already has proven history of orthostatic hypotension and admission for similar complaints 2 weeks ago. I think that the fentanyl probably exacerbated this already existing problem. I'm not sure there is a lot of utility of bringing patient back into the hospital to work it up again given that he recently had admission for the same exact complaint. Patient has had no new symptoms such as fever, dysuria, nausea, vomiting, diarrhea.  3:29 PM Patient's daughter is now bedside. It turns out the patient's oxygen at home and also ran out. She says often when that happens patient comes weak like he did today. Patient's daughter feels that he would be best served at home. Indeed I'm not sure there is a lot we would accomplish by admitting him today given the fact that he has been admitted with orthostatic hypotension times the past. I think it's likely multifactorial including both his oxygen and increase and Fentyl. We will slowly stand him up and see how he does standing. Patient has been eating and otherwise feels at his  baseline.   Unfortunatley patient is just not able to stand without hypotension.  He becomes profoundly hypotensive- to systolic in the 38V.  May need to increase mididrone/charnge home medications to increase BP overall. Feel that he may be unsafe to really send home because he will be unable to get showered or eat.    Courteney Lyn Thomasene Lot, MD 03/24/15 1542  4:04 PM Discussed with [facility].  They would like to try second liter and then to recontact them for admission.   Courteney Julio Alm, MD 03/24/15 [place], MD 03/24/15 1607

## 2015-03-24 NOTE — ED Notes (Signed)
Patient was assisted to floor and did not actually fall.  Denies pain anywhere at all.

## 2015-03-24 NOTE — ED Provider Notes (Signed)
18:05- patient seen in follow-up to reevaluate after treatment of additional IV fluids. His orthostatics, are now normal. The patient is comfortable now, and is committed to drinking more fluids. Findings were discussed with the patient and his family members, all questions were answered.  Daleen Bo, MD 03/24/15 [phone]

## 2015-03-24 NOTE — ED Notes (Signed)
MD at bedside. 

## 2015-03-25 LAB — URINE CULTURE

## 2015-03-25 NOTE — Assessment & Plan Note (Signed)
Continue on current regimen  Continue on Oxygen 2l/m rest  May use 3l/m with activity .  Follow up [name] in 1 month as planned and As needed

## 2015-03-29 ENCOUNTER — Ambulatory Visit (INDEPENDENT_AMBULATORY_CARE_PROVIDER_SITE_OTHER): Payer: Commercial Managed Care - HMO | Admitting: Interventional Cardiology

## 2015-03-29 DIAGNOSIS — Z5181 Encounter for therapeutic drug level monitoring: Secondary | ICD-10-CM

## 2015-03-29 DIAGNOSIS — I480 Paroxysmal atrial fibrillation: Secondary | ICD-10-CM

## 2015-03-29 LAB — POCT INR: INR: 3.5

## 2015-03-30 ENCOUNTER — Telehealth: Payer: Self-pay | Admitting: Internal Medicine

## 2015-03-30 DIAGNOSIS — J849 Interstitial pulmonary disease, unspecified: Secondary | ICD-10-CM

## 2015-03-30 NOTE — Telephone Encounter (Signed)
Spoke with pt's daughter. They are needing a third portable oxygen tank. Order has been placed. Nothing further was needed.

## 2015-04-05 ENCOUNTER — Encounter: Payer: Self-pay | Admitting: *Deleted

## 2015-04-05 ENCOUNTER — Ambulatory Visit (INDEPENDENT_AMBULATORY_CARE_PROVIDER_SITE_OTHER): Payer: Commercial Managed Care - HMO | Admitting: Pharmacist

## 2015-04-05 DIAGNOSIS — I480 Paroxysmal atrial fibrillation: Secondary | ICD-10-CM

## 2015-04-05 DIAGNOSIS — Z5181 Encounter for therapeutic drug level monitoring: Secondary | ICD-10-CM

## 2015-04-05 LAB — POCT INR: INR: 3.9

## 2015-04-07 IMAGING — CT CT L SPINE W/ CM
4 of 10 series · 13 of 33 positions shown, 14 images · IV contrast (omnipaque)
Comparison: Lumbar spine CT 07/06/2011

CLINICAL DATA: Low back pain.  Bilateral lower extremity pain.

MYELOGRAM INJECTION
TECHNIQUE: Informed consent was obtained from the patient prior to
the procedure, including potential complications of headache,
allergy, infection and pain.  A timeout procedure was performed.
With the patient prone, the lower back was prepped with Betadine.
1% Lidocaine was used for local anesthesia.  Lumbar puncture was
performed at the left paramidline L2-3 level using a 22 gauge
needle with return of clear CSF.  15 ml of Omnipaque 825was
injected into the subarachnoid space .
TECHNIQUE: I personally performed the lumbar puncture and
administered the intrathecal contrast. I also personally supervised
acquisition of the myelogram images. Following injection of
intrathecal Omnipaque contrast, spine imaging in multiple
projections was performed using fluoroscopy.
Fluoroscopy Time: 1 minute 30 seconds
TECHNIQUE: CT imaging of the lumbar spine was performed after
intrathecal contrast administration.  Multiplanar CT image
reconstructions were also generated.

[Series 2: l spine bone · axial · 0.27mm/px · z∈[-310,-222]mm · 2 of 105 slices shown, 3 images]
[im 35/105  soft-tissue]
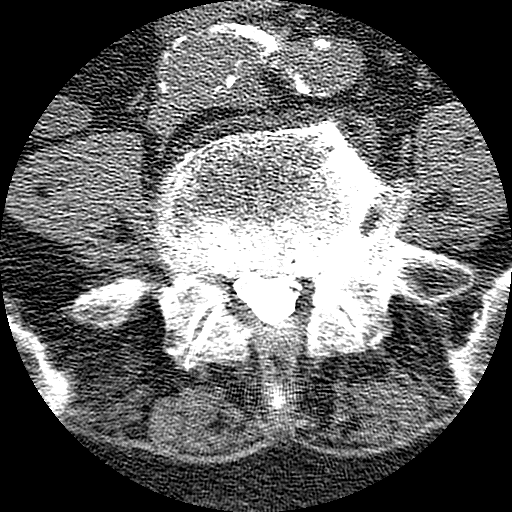
[im 35/105  bone]
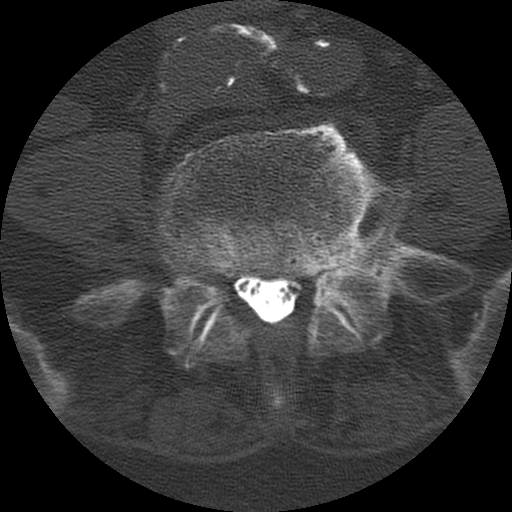
[im 70/105  bone]
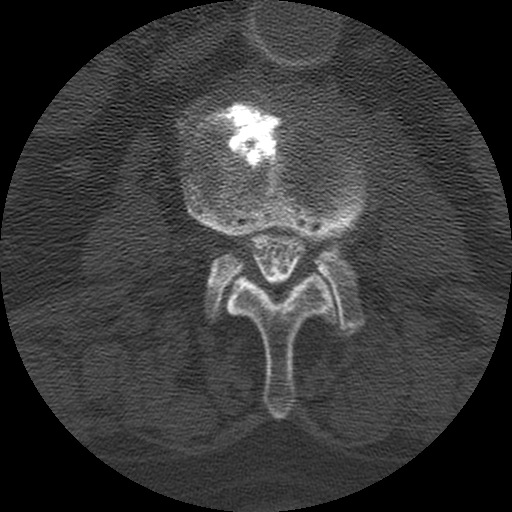

[Series 3: l spine soft · axial · 0.27mm/px · z∈[-330,-200]mm · 3 of 105 slices shown]
[im 27/105  soft-tissue]
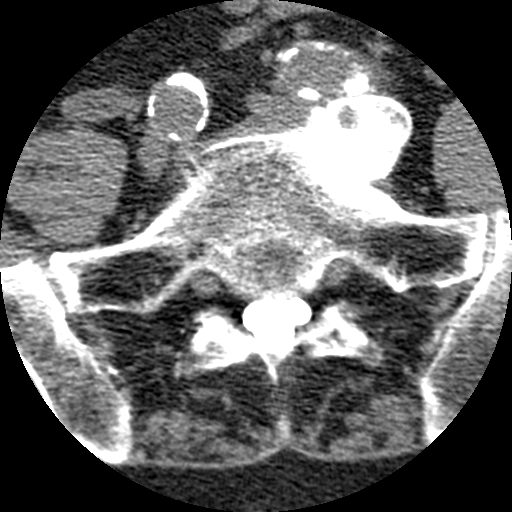
[im 53/105  soft-tissue]
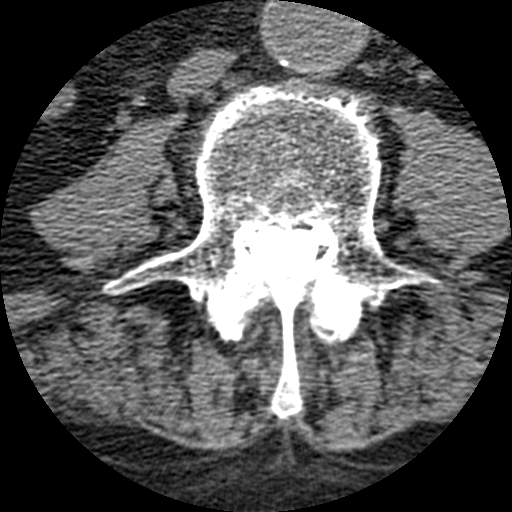
[im 79/105  soft-tissue]
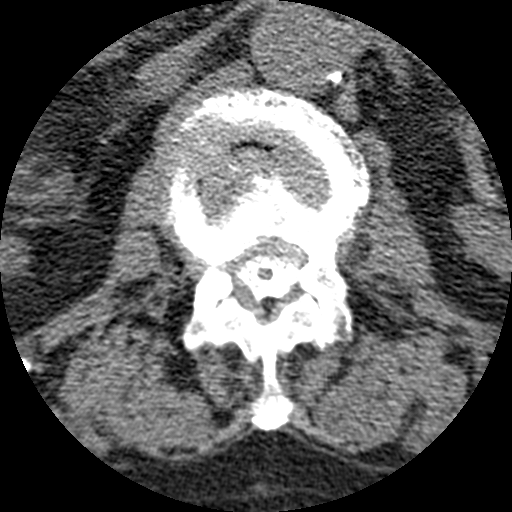

[Series 104: cor/lower · coronal · 0.31mm/px · 3 of 50 slices shown]
[im 10/50  bone]
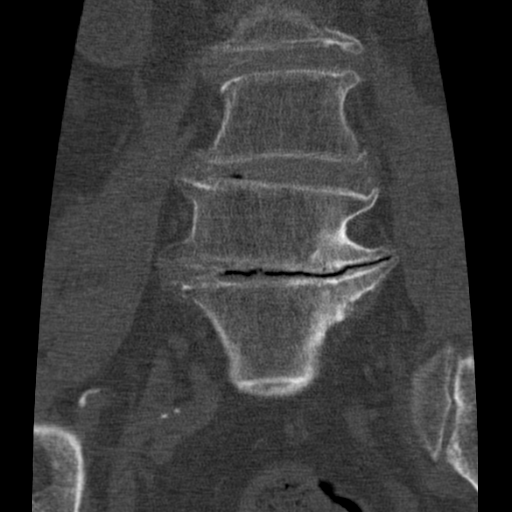
[im 20/50  bone]
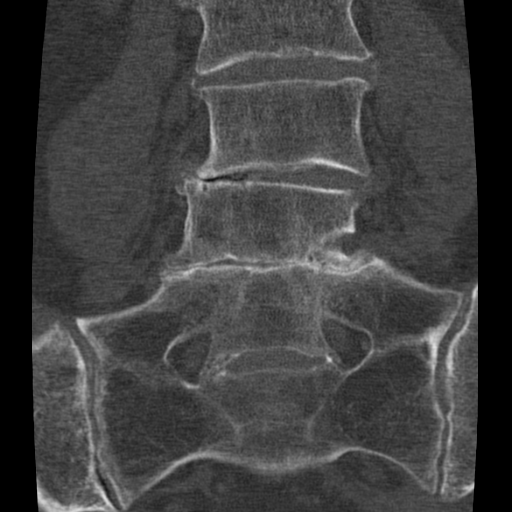
[im 30/50  bone]
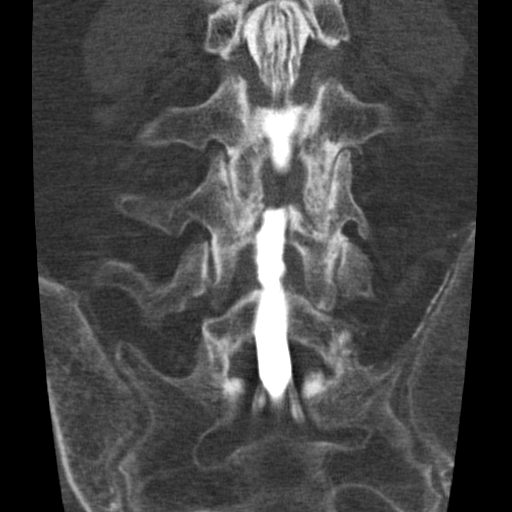

[Series 105: sag · sagittal · 0.52mm/px · 5 of 50 slices shown]
[im 9/50  bone]
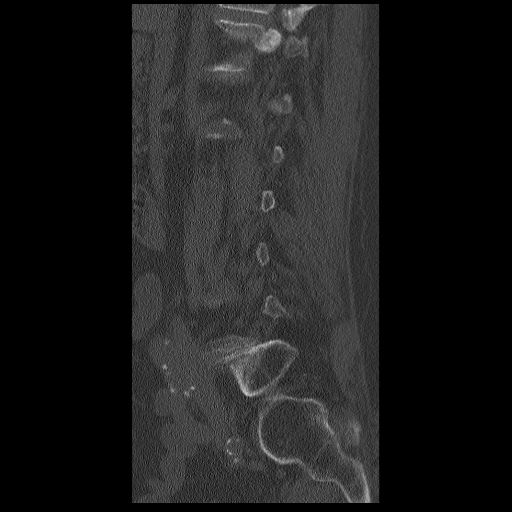
[im 17/50  bone]
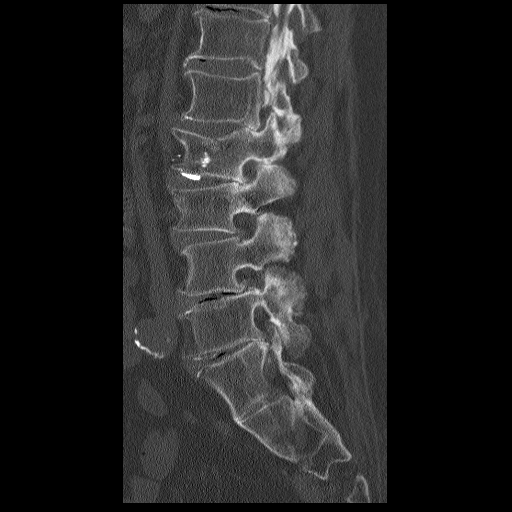
[im 25/50  bone]
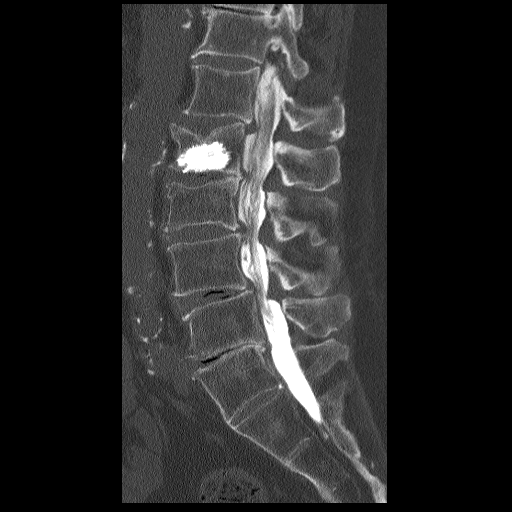
[im 33/50  bone]
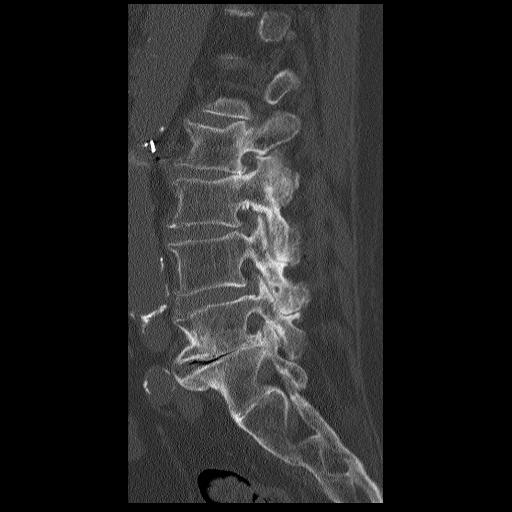
[im 41/50  bone]
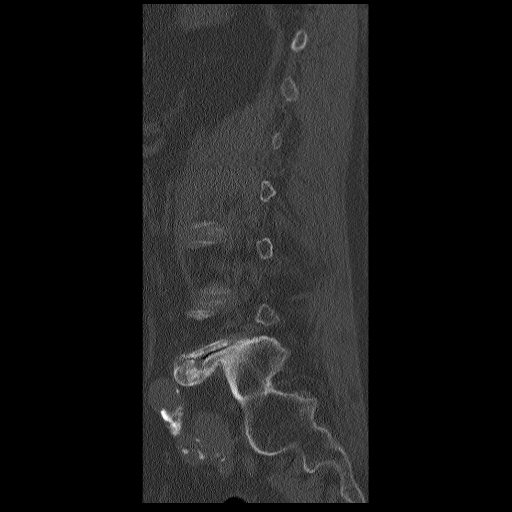

[13 of 33 positions shown; findings below may reference images not displayed]

IMPRESSION: Successful injection of  intrathecal contrast for myelography.

MYELOGRAM LUMBAR
FINDINGS: Transitional anatomy is evident at S1.  There is severe
loss of disc height at L5-S1 with posterior osteophyte formation.
A broad-based disc herniation is present at L1-2 and L2-3 with mild
to moderate central canal stenosis of both levels.  A broad-based
disc herniation is present at L3-4 with moderate to severe central
canal stenosis, worse left than right.  A large disc herniation is
worse on the left at L4-5.  There is left greater than right
lateral recess narrowing.  Severe loss of disc height is noted at
L5-S1 without significant disc herniation or stenosis.

Anterolisthesis at L4-5 is more evident upon standing.  This is
slightly exaggerated with flexion and reduced in extension.
Alignment is otherwise stable with standing, flexion, and
extension.
IMPRESSION: 1.  Large leftward disc herniation at L4-5 with left greater than
right lateral recess narrowing.
2.  Moderate to severe central canal stenosis at L3-4.
3.  Mild to moderate central canal stenosis at L1-2 and L2-3.
4.  Dynamic anterolisthesis at L4-5.


CT MYELOGRAPHY LUMBAR SPINE
FINDINGS: Lumbar spine is imaged from T11-12 through S3-4.  Conus
medullaris terminates at L2, within normal limits.  The patient is
status post vertebral augmentation at L2.  Slight retrolisthesis is
present at L1-2.  Mild anterolisthesis is noted at L4-5.  There are
vacuum discs at L4-5 and L5-S1.

Atherosclerotic calcifications are present in the abdominal aorta.
The maximal diameter is 3.0 cm.  The right iliac artery measures
2.0 cm proximally.  The left iliac artery measures 1.4 cm
maximally. Leftward curvature of the lumbar spine is centered at L4-
5.

T11-12:  A mild broad-based disc herniation is present.  Moderate
facet hypertrophy is noted.  Moderate left and mild right foraminal
stenosis is present.

T12-L1: A shallow disc protrusion is present without significant
stenosis.

L1-2:  A broad-based disc herniation is associated with the
retrolisthesis.  Mild to moderate lateral recess narrowing is
present bilaterally.  Moderate foraminal stenosis is present
bilaterally.

L2-3:  A broad-based disc herniation is present.  Advanced facet
hypertrophy and ligament flavum thickening is evident.  Moderate
lateral recess narrowing is worse on the right.  Moderate right and
mild left foraminal stenosis is present.

L3-4:  A broad-based disc herniation is present.  Moderate facet
hypertrophy is noted.  Mild to moderate lateral recess narrowing is
worse on the left.  Mild moderate foraminal narrowing is present
bilaterally as well.

L4-5:  A broad-based disc herniation is present.  Moderate facet
hypertrophy is noted.  Moderate left and mild right lateral recess
narrowing is evident.  Facet spurring contributes to moderate
foraminal stenosis bilaterally.

L5-S1:  A leftward disc herniation is associated with endplate
spurring.  Mild left lateral recess narrowing is evident.  Endplate
and facet spurring contribute to mild foraminal stenosis
bilaterally.
IMPRESSION: 1.  Moderate left and mild right foraminal stenosis at T11-12.

2.  Mild to moderate lateral recess and moderate foraminal stenosis
bilaterally at L1-2.
3.  Moderate lateral recess narrowing is worse on the right at L2-
3.
4.  Moderate right mild left foraminal stenosis at L2-3.
5.  Mild to moderate lateral recess and foraminal stenosis at L3-4.
6.  Moderate left and mild right lateral recess narrowing at L4-5.
7.  Moderate foraminal stenosis bilaterally at L4-5.
8.  Mild left lateral recess and mild bilateral foraminal stenosis
at L5-S1.
9.  Status post vertebral augmentation at L2.
10.  Levoconvex scoliosis.

## 2015-04-08 ENCOUNTER — Other Ambulatory Visit: Payer: Self-pay | Admitting: *Deleted

## 2015-04-08 NOTE — Patient Outreach (Signed)
High Risk List Patient Referral from Dr. Felipa Eth. I spoke with Peter Richards this morning and he agreed to an in home visit next Wednesday, November 9th at 1:00 pm.  [facility] L[facility] [phone]

## 2015-04-11 ENCOUNTER — Ambulatory Visit (INDEPENDENT_AMBULATORY_CARE_PROVIDER_SITE_OTHER): Payer: Commercial Managed Care - HMO | Admitting: Internal Medicine

## 2015-04-11 ENCOUNTER — Telehealth: Payer: Self-pay | Admitting: Internal Medicine

## 2015-04-11 ENCOUNTER — Encounter: Payer: Self-pay | Admitting: Internal Medicine

## 2015-04-11 VITALS — BP 102/60 | HR 61 | Ht 68.0 in | Wt 156.0 lb

## 2015-04-11 DIAGNOSIS — J9611 Chronic respiratory failure with hypoxia: Secondary | ICD-10-CM

## 2015-04-11 DIAGNOSIS — J849 Interstitial pulmonary disease, unspecified: Secondary | ICD-10-CM

## 2015-04-11 NOTE — Telephone Encounter (Signed)
Spoke with pt's daughter. Advised her that I have paged MR. Will route message to him to address.

## 2015-04-11 NOTE — Telephone Encounter (Signed)
Spoke to his daughter. It is documented in my note

## 2015-04-11 NOTE — Patient Instructions (Signed)
ICD-9-CM ICD-10-CM   1. Interstitial lung disease ([place]) 515 J84.9   2. Chronic respiratory failure with hypoxia [facility]) 518.83 J96.11    799.02      Will talk to your daughter about esbriet and dyspnea and quality of life issues Will call you back 48-72h  Followup  3 months or sooner

## 2015-04-11 NOTE — Progress Notes (Signed)
Subjective:     Patient ID: Peter Richards, male   DOB: May 09, 1929, 81 y.o.   MRN: [id number]  HPI   OV 04/11/2015  Chief Complaint  Patient presents with  . Follow-up    Pt last seen in August and was advised to f//u in 4 weeks about a decision about ofev of Esbriet, pt unsure. Pt c/o increase in SOB, prod cough with green mucus, increase in fatigue, epigastric pain with inhalation.     follow-up idiopathic pulmonary fibrosis with chronic hypoxemic respiratory failure (FVC 40%, DLCO 20%). This time he presensts with his wife. I personally saw him last in August 2016. His daughter [name] telephone [phone] is not with him today. Last visit I recommended esbreit for idiopathic pulmonary fibrosis. At that point in time he was moving his home which she has since accomplished.. He also had significant anxiety and therefore that point he declined physical therapy. Now he tells me that he is undergoing home physical therapy 2 times a week which is helped his stamina but does not help his dyspnea. His main symptom is dyspnea. He is also deconditioned and frail. In fact is sitting on a wheelchair. He and his wife attest that his quality of life is poor.  He is still not certain about taking esbriet . is on Coumadin he cannot take the other IPF drug Ofev. He understands all about Esbreit. Hhe understands the efficacy as slow down progression of the disease.  he understands the third of patients get GI side effects which are reversible. He understands the need to wear sunscreen. He understands the need for liver function monitoring. He understands that there is no data on people over 80 . He reports that he's had a discussion about this with his primary care physician and in the presence of so many other medications he is not certain that he wants to take this. He is heavily leaning against taking it but that is some degree of uncertainty. He also has lost weight. Also worried about cost - he  understood copay can be expensive $2000   He understands that IPF is a progressive disease that average life expectancy is few to several years. He had questions about how end-of-life looks like an different modes of death. He certainly does not want to die and this is his only uncertainty as to why he might want to take  Esbriet he does want me to talk to his daughter over the phone about this        Current outpatient prescriptions:  .  acetaminophen (TYLENOL) 500 MG tablet, Take 2 tablets (1,000 mg total) by mouth 3 (three) times daily., Disp: 30 tablet, Rfl: 0 .  albuterol (PROVENTIL HFA;VENTOLIN HFA) 108 (90 BASE) MCG/ACT inhaler, Inhale 2 puffs into the lungs every 6 (six) hours as needed for wheezing or shortness of breath., Disp: 1 Inhaler, Rfl: 5 .  alendronate (FOSAMAX) 70 MG tablet, Take 1 tablet by mouth once a week., Disp: , Rfl:  .  budesonide-formoterol (SYMBICORT) 160-4.5 MCG/ACT inhaler, Inhale 2 puffs into the lungs 2 (two) times daily., Disp: 1 Inhaler, Rfl: 5 .  calcitonin, salmon, (MIACALCIN/FORTICAL) 200 UNIT/ACT nasal spray, Place 1 spray into alternate nostrils daily., Disp: 3.7 mL, Rfl: 12 .  diclofenac sodium (VOLTAREN) 1 % GEL, Apply 4 g topically 4 (four) times daily., Disp: , Rfl:  .  feeding supplement, ENSURE ENLIVE, (ENSURE ENLIVE) LIQD, Take 237 mLs by mouth 2 (two) times daily between meals., Disp: 237  mL, Rfl: 12 .  fentaNYL (DURAGESIC - DOSED MCG/HR) 12 MCG/HR, Place 5 patches (62.5 mcg total) onto the skin every 3 (three) days. (Patient taking differently: Place 24 mcg onto the skin every 3 (three) days. ), Disp: 5 patch, Rfl: 0 .  furosemide (LASIX) 20 MG tablet, Take 1 tablet (20 mg total) by mouth daily as needed (increased shortness of breath)., Disp: 30 tablet, Rfl: 3 .  ipratropium-albuterol (DUONEB) 0.5-2.5 (3) MG/3ML SOLN, Take 3 mLs by nebulization every 4 (four) hours as needed., Disp: 360 mL, Rfl: 5 .  methocarbamol (ROBAXIN) 500 MG tablet, Take 1  tablet (500 mg total) by mouth 3 (three) times daily., Disp: 10 tablet, Rfl: 0 .  metoprolol succinate (TOPROL-XL) 25 MG 24 hr tablet, Take 1 tablet (25 mg total) by mouth daily., Disp: , Rfl:  .  midodrine (PROAMATINE) 5 MG tablet, Take 1 tablet (5 mg total) by mouth 3 (three) times daily with meals., Disp: , Rfl:  .  morphine (MSIR) 15 MG tablet, Take 1 tablet (15 mg total) by mouth every 2 (two) hours as needed (BREAKTHROUGH PAIN)., Disp: 30 tablet, Rfl: 0 .  polyethylene glycol (MIRALAX / GLYCOLAX) packet, Take 17 g by mouth daily as needed., Disp: , Rfl:  .  pregabalin (LYRICA) 25 MG capsule, Take 1 capsule (25 mg total) by mouth at bedtime., Disp: , Rfl:  .  PRESCRIPTION MEDICATION, Wears 2 L of O2 continous, Disp: , Rfl:  .  warfarin (COUMADIN) 5 MG tablet, Take 5-7.5 mg by mouth every evening. Take 7.5mg  on Monday, Wednesday and Friday. Take 5mg  all other days of the week., Disp: , Rfl:   Allergies  Allergen Reactions  . Codeine Nausea And Vomiting    Immunization History  Administered Date(s) Administered  . Influenza Split 04/04/2012, 03/04/2013  . Influenza,inj,Quad PF,36+ Mos 03/04/2014, 03/16/2015  . Pneumococcal-Unspecified 11/02/2013  . Tdap 11/02/2013     Review of Systems  As per history of present illness     Objective:   Physical Exam  Filed Vitals:   04/11/15 1016  BP: 102/60  Pulse: 61  Height: 5\' 8"  (1.727 m)  Weight: 156 lb (70.761 kg)  SpO2: 91%    frail male sitting in the chair   cognitive capacities intact although very slow to respond Somewhat sleepy and noticed to be on morphine medication Extensive bilateral crackles Oxygen on     Assessment:       ICD-9-CM ICD-10-CM   1. Interstitial lung disease ([place]) 515 J84.9   2. Chronic respiratory failure with hypoxia (H[facility]) 518.83 J96.11    799.02         Plan:   Extensive discussion on the efficacy, limitations, side effect profile, need for monitoring of Esbriet in idiopathic fibrosis  management. Prognosis, outcomes and, natural course of the disease and symptom management also discussed.   D/w daughter over phone: patient 80% does not want to take med due to quality of life issue. 20% wants to take due to improving longevity. Cost a big concern - explained process for copay charity. He also felt overwhelmed that [employer] was being pushy  Alternate of hospice discussed with dtr over phone. Did not talk to patient  - Discussed medicare hospice benefit  - a medicare paid benefit  - for people with terminal qualifying  illness such as IPF, COPD, cancer with statistical prognosis < 6 months for which there is no cure - utilization of hospice shows people live longer paradoxically than those without  hospice due to improved attention  - explained hospice locations - home, residential etc.,   - explained respite care options for caregivers  - explained that she could still get treatment for non-hospice diagnosis and still come to office to see me for hospice related diagnosis that i provide support for  - explained that hospice provides nursing, MD, chaplain, volunteer and medications and supplies paid through medicare   Daughter will talk to him about options and get back to me over phone. She will come for next vist 3 months  > 50% of this > 25 min visit spent in face to face counseling or coordination of care    Dr. Brand Males, M.D., [facility].C.P Pulmonary and Critical Care Medicine Staff Physician [facility] Pager: [phone], If no answer or between  15:00h - 7:00h: call [phone]  04/11/2015 4:15 PM

## 2015-04-12 ENCOUNTER — Ambulatory Visit (INDEPENDENT_AMBULATORY_CARE_PROVIDER_SITE_OTHER): Payer: Commercial Managed Care - HMO | Admitting: Cardiovascular Disease

## 2015-04-12 DIAGNOSIS — I480 Paroxysmal atrial fibrillation: Secondary | ICD-10-CM

## 2015-04-12 DIAGNOSIS — Z5181 Encounter for therapeutic drug level monitoring: Secondary | ICD-10-CM

## 2015-04-12 LAB — PROTIME-INR: INR: 3.3 — AB (ref 0.9–1.1)

## 2015-04-13 ENCOUNTER — Ambulatory Visit (HOSPITAL_COMMUNITY): Admission: RE | Admit: 2015-04-13 | Payer: Commercial Managed Care - HMO | Source: Ambulatory Visit

## 2015-04-13 ENCOUNTER — Other Ambulatory Visit: Payer: Self-pay | Admitting: *Deleted

## 2015-04-13 ENCOUNTER — Encounter: Payer: Self-pay | Admitting: *Deleted

## 2015-04-14 ENCOUNTER — Encounter: Payer: Self-pay | Admitting: *Deleted

## 2015-04-14 ENCOUNTER — Telehealth: Payer: Self-pay | Admitting: Cardiology

## 2015-04-14 NOTE — Telephone Encounter (Signed)
I spoke with Dr. Elinor Parkinson about Peter Richards.  He is extremely weak and nearly fell twice yesterday.  He is maintaining normal sinus rhythm.  Plans for possible hospice are being discussed.  We will stop his warfarin at this time and just have him take a baby aspirin daily.  Dr. Elinor Parkinson will make those changes.

## 2015-04-14 NOTE — Patient Outreach (Signed)
[facility]) Care Management   04/14/2015  Peter Richards [birth year] [id number]  Peter Richards is an 81 y.o. male  Subjective:   Objective:   Review of Systems  HENT: Negative.   Eyes: Negative.   Respiratory: Negative.        Wear O2 at 2L continuously.  Cardiovascular: Negative.   Gastrointestinal: Negative.   Genitourinary: Negative.   Musculoskeletal: Positive for back pain.  Skin: Negative.   Neurological: Positive for weakness.  Endo/Heme/Allergies: Negative.   Psychiatric/Behavioral: Negative.    BP 118/58 mmHg  Pulse 60  Resp 18  Ht 1.727 m (5\' 8" )  Wt 158 lb (71.668 kg)  BMI 24.03 kg/m2  SpO2 98%    Physical Exam  Constitutional: He is oriented to person, place, and time. He appears well-developed.  Frail appearing gentleman.  HENT:  Head: Normocephalic.  Cardiovascular: Normal rate, regular rhythm and normal heart sounds.   Respiratory: Effort normal. He has rales.  Considerable crackles in left upper and lower chest.  GI: Soft. Bowel sounds are normal.  Musculoskeletal:  Limited ROM.  Neurological: He is alert and oriented to person, place, and time.  Skin: Skin is warm and dry.  Psychiatric: He has a normal mood and affect.    Current Medications:   Current Outpatient Prescriptions  Medication Sig Dispense Refill  . acetaminophen (TYLENOL) 500 MG tablet Take 2 tablets (1,000 mg total) by mouth 3 (three) times daily. 30 tablet 0  . albuterol (PROVENTIL HFA;VENTOLIN HFA) 108 (90 BASE) MCG/ACT inhaler Inhale 2 puffs into the lungs every 6 (six) hours as needed for wheezing or shortness of breath. 1 Inhaler 5  . alendronate (FOSAMAX) 70 MG tablet Take 1 tablet by mouth once a week.    . budesonide-formoterol (SYMBICORT) 160-4.5 MCG/ACT inhaler Inhale 2 puffs into the lungs 2 (two) times daily. 1 Inhaler 5  . feeding supplement, ENSURE ENLIVE, (ENSURE ENLIVE) LIQD Take 237 mLs by mouth 2 (two) times daily between meals. 237 mL  12  . fentaNYL (DURAGESIC - DOSED MCG/HR) 12 MCG/HR Place 5 patches (62.5 mcg total) onto the skin every 3 (three) days. (Patient taking differently: Place 24 mcg onto the skin every 3 (three) days. ) 5 patch 0  . furosemide (LASIX) 20 MG tablet Take 1 tablet (20 mg total) by mouth daily as needed (increased shortness of breath). 30 tablet 3  . ipratropium-albuterol (DUONEB) 0.5-2.5 (3) MG/3ML SOLN Take 3 mLs by nebulization every 4 (four) hours as needed. 360 mL 5  . methocarbamol (ROBAXIN) 500 MG tablet Take 1 tablet (500 mg total) by mouth 3 (three) times daily. 10 tablet 0  . metoprolol succinate (TOPROL-XL) 25 MG 24 hr tablet Take 1 tablet (25 mg total) by mouth daily.    . midodrine (PROAMATINE) 5 MG tablet Take 1 tablet (5 mg total) by mouth 3 (three) times daily with meals.    [name] morphine (MSIR) 15 MG tablet Take 1 tablet (15 mg total) by mouth every 2 (two) hours as needed (BREAKTHROUGH PAIN). 30 tablet 0  . polyethylene glycol (MIRALAX / GLYCOLAX) packet Take 17 g by mouth daily as needed.    . pregabalin (LYRICA) 25 MG capsule Take 1 capsule (25 mg total) by mouth at bedtime.    [name] PRESCRIPTION MEDICATION Wears 2 L of O2 continous    . warfarin (COUMADIN) 5 MG tablet Take 5-7.5 mg by mouth every evening. Take 7.5mg  on Monday, Wednesday and Friday. Take 5mg  all other days of  the week.    . calcitonin, salmon, (MIACALCIN/FORTICAL) 200 UNIT/ACT nasal spray Place 1 spray into alternate nostrils daily. (Patient not taking: Reported on 04/13/2015) 3.7 mL 12  . diclofenac sodium (VOLTAREN) 1 % GEL Apply 4 g topically 4 (four) times daily. (Patient not taking: Reported on 04/13/2015)     No current facility-administered medications for this visit.    Functional Status:   In your present state of health, do you have any difficulty performing the following activities: 04/13/2015 03/02/2015  Hearing? Y N  Vision? N N  Difficulty concentrating or making decisions? Y N  Walking or climbing stairs? Y N   Dressing or bathing? Y N  Doing errands, shopping? Y -  Conservation officer, nature and eating ? Y -  Using the Toilet? N -  In the past six months, have you accidently leaked urine? N -  Do you have problems with loss of bowel control? N -  Managing your Medications? N -  Managing your Finances? N -  Housekeeping or managing your Housekeeping? Y -    Fall/Depression Screening:    PHQ 2/9 Scores 04/13/2015  PHQ - 2 Score 0   I DO NOT THINK THE PT ANSWERED THESE QUESTIONS TRUTHFULLY ANOTHER MORE PRIVATE CONVERSATION MAY REVEAL DIFFERENT RESULTS!   Fall Risk  04/13/2015  Falls in the past year? Yes  Number falls in past yr: 1  Injury with Fall? Yes  Risk Factor Category  High Fall Risk  Risk for fall due to : History of fall(s);Impaired balance/gait;Impaired mobility  Follow up Falls evaluation completed;Education provided;Falls prevention discussed    Assessment:  No [facility] Care Management needs. Pt resides at [name] [employer] and he has all the resources he needs right there.  Plan: No further involvement required. I have given pt THN information and let him know he can call me at any time for questions or assistance if he does have a need. I also provided him with our COPD GOLD pt education folder and went over this information.  It was a pleasure meeting he and his wife!  Deloria Lair [facility] [place] [phone]

## 2015-04-18 ENCOUNTER — Telehealth: Payer: Self-pay | Admitting: Internal Medicine

## 2015-04-18 NOTE — Telephone Encounter (Signed)
FYI - [name], patient's daughter called to let you know that patient has decided not to take Esbriet.

## 2015-04-19 NOTE — Telephone Encounter (Signed)
Spoke with pt's daughter, aware of MR's recs.  Nothing further needed.

## 2015-04-19 NOTE — Telephone Encounter (Signed)
Please let her know that I support Peter Richards and the family in that decision 100%

## 2015-04-22 ENCOUNTER — Telehealth: Payer: Self-pay | Admitting: Internal Medicine

## 2015-04-22 NOTE — Telephone Encounter (Signed)
This is MR pt. Will forward to let him know.

## 2015-04-22 NOTE — Telephone Encounter (Signed)
Is it FYI or do I need to do something? I suppor the move

## 2015-04-29 IMAGING — CR DG CHEST 2V
2 series · 2 of 2 positions shown · non-contrast
Comparison: 08/20/2011

CLINICAL DATA: Preop radiograph

CHEST - 2 VIEW

[view not recorded (1 of 2)]
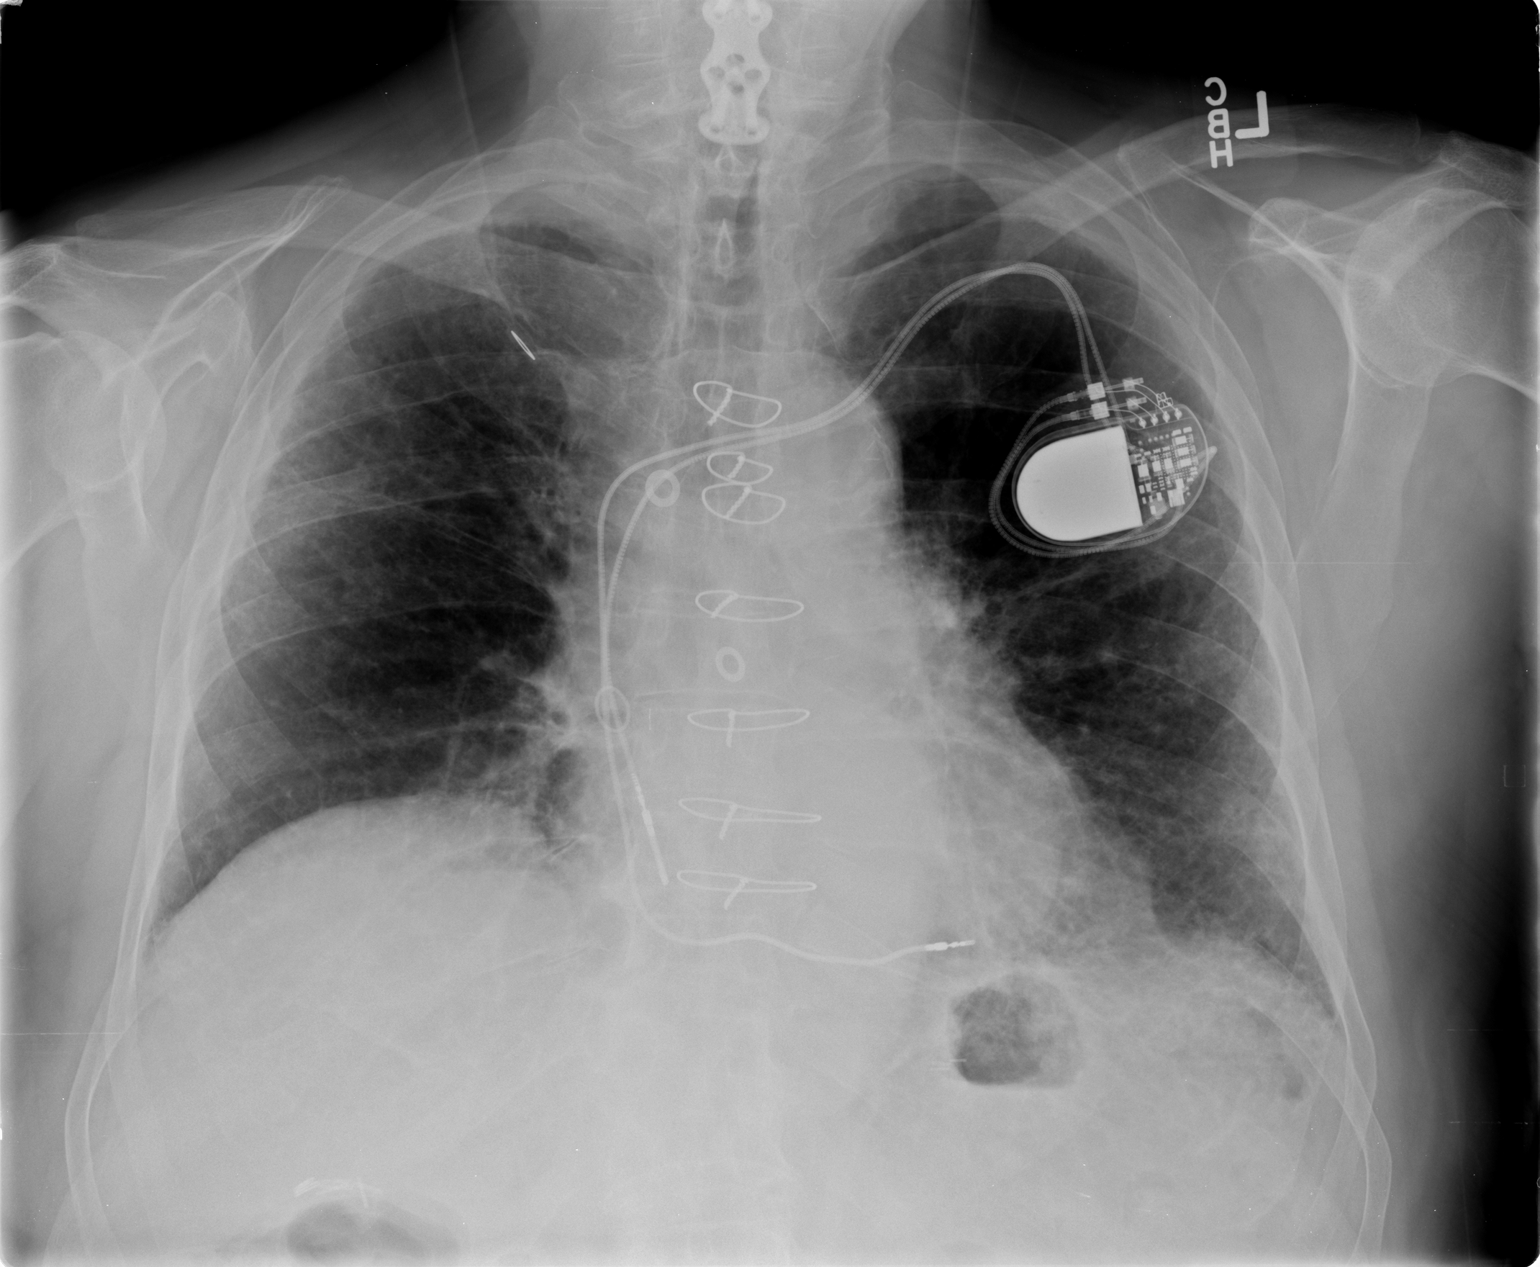

[view not recorded (2 of 2)]
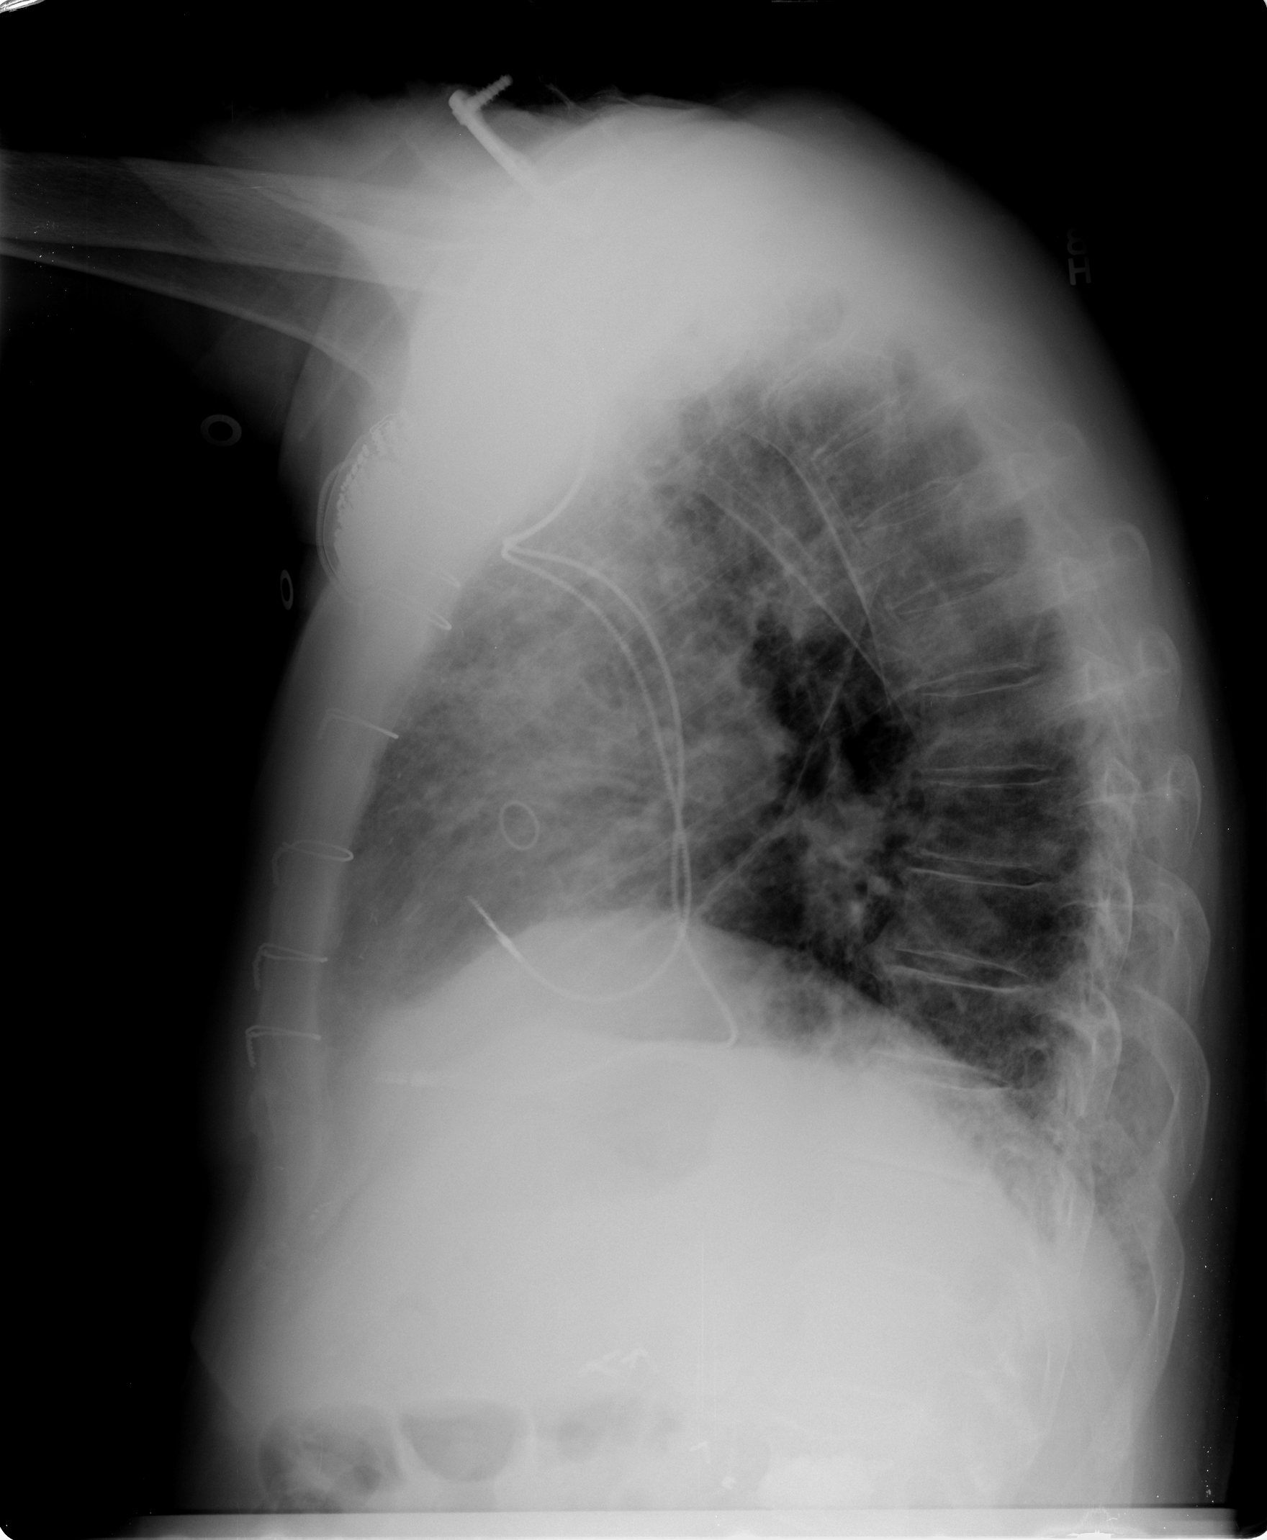

[2 of 2 positions shown; findings below may reference images not displayed]

FINDINGS: Previous median sternotomy CABG procedure.  There is a
left chest wall pacer device with lead in the right atrial
appendage and right ventricle.

Normal heart size.  No pleural effusion or edema.  Bilateral
peripheral and basilar predominant reticular interstitial
abnormality is again noted compatible with chronic interstitial
lung disease.  No superimposed airspace consolidation noted.  The
patient is status post anterior disc fusion of the lower cervical
spine.
IMPRESSION: 1.  No acute cardiopulmonary abnormalities.
2.  Similar appearance of chronic interstitial lung disease.

## 2015-05-13 ENCOUNTER — Ambulatory Visit: Payer: Commercial Managed Care - HMO | Admitting: Cardiology

## 2015-05-13 ENCOUNTER — Encounter: Payer: Self-pay | Admitting: Internal Medicine

## 2015-05-13 ENCOUNTER — Other Ambulatory Visit: Payer: Self-pay

## 2015-05-13 ENCOUNTER — Ambulatory Visit (INDEPENDENT_AMBULATORY_CARE_PROVIDER_SITE_OTHER): Payer: Commercial Managed Care - HMO | Admitting: Internal Medicine

## 2015-05-13 VITALS — BP 122/78 | HR 93 | Ht 68.0 in | Wt 153.8 lb

## 2015-05-13 DIAGNOSIS — I4891 Unspecified atrial fibrillation: Secondary | ICD-10-CM | POA: Diagnosis not present

## 2015-05-13 DIAGNOSIS — Z950 Presence of cardiac pacemaker: Secondary | ICD-10-CM

## 2015-05-13 DIAGNOSIS — J84112 Idiopathic pulmonary fibrosis: Secondary | ICD-10-CM | POA: Diagnosis not present

## 2015-05-13 DIAGNOSIS — I480 Paroxysmal atrial fibrillation: Secondary | ICD-10-CM

## 2015-05-13 NOTE — Patient Instructions (Signed)
Medication Instructions:  Your physician recommends that you continue on your current medications as directed. Please refer to the Current Medication list given to you today.  Labwork: None ordered  Testing/Procedures: None ordered  Follow-Up: Remote monitoring is used to monitor your Pacemaker of ICD from home. This monitoring reduces the number of office visits required to check your device to one time per year. It allows us to keep an eye on the functioning of your device to ensure it is working properly. You are scheduled for a device check from home on 08/15/15. You may send your transmission at any time that day. If you have a wireless device, the transmission will be sent automatically. After your physician reviews your transmission, you will receive a postcard with your next transmission date.  Your physician wants you to follow-up in: 1 year with Dr. Taylor.  You will receive a reminder letter in the mail two months in advance. If you don't receive a letter, please call our office to schedule the follow-up appointment.   Any Other Special Instructions Will Be Listed Below (If Applicable).  If you need a refill on your cardiac medications before your next appointment, please call your pharmacy.  Thank you for choosing [facility]!!        

## 2015-05-13 NOTE — Assessment & Plan Note (Signed)
He is maintaining NSR. He will continue his current meds. He is off of amiodarone.

## 2015-05-13 NOTE — Assessment & Plan Note (Signed)
His exam demonstrates rales in the bases but no increased work of breathing. He is on home oxygen.

## 2015-05-13 NOTE — Assessment & Plan Note (Signed)
His medtronic DDD PM is working normally. Will recheck in several months. 

## 2015-05-13 NOTE — Progress Notes (Signed)
HPI Peter Richards returns today for followup. He is a very pleasant 81 year old man with an ischemic heart disease, symptomatic bradycardia, status post permanent pacemaker insertion. He has hypertension. He denies chest pain. He denies peripheral edema. He notes shortness of breath, class II. He denies wheezes or coughing. He denies syncope. His appetite has been down. He is wearing oxygen all of the time. Allergies  Allergen Reactions  . Codeine Nausea And Vomiting     Current Outpatient Prescriptions  Medication Sig Dispense Refill  . aspirin 81 MG tablet Take 81 mg by mouth daily.    . budesonide-formoterol (SYMBICORT) 160-4.5 MCG/ACT inhaler Inhale 2 puffs into the lungs 2 (two) times daily. 1 Inhaler 5  . dexamethasone (DECADRON) 4 MG tablet Take 4 mg by mouth 2 (two) times daily with a meal.    . fentaNYL (DURAGESIC - DOSED MCG/HR) 50 MCG/HR Place 1 patch onto the skin every 3 (three) days.    . furosemide (LASIX) 20 MG tablet Take 1 tablet (20 mg total) by mouth daily as needed (increased shortness of breath). 30 tablet 3  . ipratropium-albuterol (DUONEB) 0.5-2.5 (3) MG/3ML SOLN Take 3 mLs by nebulization every 4 (four) hours as needed. 360 mL 5  . LORazepam (ATIVAN) 0.5 MG tablet Take 1 tablet by mouth every 4 (four) hours as needed. Anxiety    . methocarbamol (ROBAXIN) 500 MG tablet Take 1 tablet (500 mg total) by mouth 3 (three) times daily. 10 tablet 0  . metoprolol succinate (TOPROL-XL) 25 MG 24 hr tablet Take 1 tablet (25 mg total) by mouth daily.    . midodrine (PROAMATINE) 5 MG tablet Take 1 tablet (5 mg total) by mouth 3 (three) times daily with meals.    [name] omeprazole (PRILOSEC) 20 MG capsule Take 20 mg by mouth daily.    [name] oxyCODONE (ROXICODONE) 15 MG immediate release tablet Take 1 tablet by mouth every 4 (four) hours as needed. Pain    . PRESCRIPTION MEDICATION Wears 2 L of O2 continous    . zolpidem (AMBIEN) 5 MG tablet Take 5 mg by mouth at bedtime.  4   No current  facility-administered medications for this visit.     Past Medical History  Diagnosis Date  . Hyperlipidemia   . Orthostatic hypotension   . Bradycardia   . Syncope     s/p PTVP  . Myocardial infarction ([place])     1981  . CAD (coronary artery disease)     prior CABG in 2002 negative Myoview in 2009  . Hypertension   . Stroke [facility])     TIA  2007    . GERD (gastroesophageal reflux disease)   . Headache(784.0)   . Arthritis     OSTEOPOROSIS  . Aortic stenosis, mild     by 01/08/08 echo  . Chronic anticoagulation   . Depression   . AAA (abdominal aortic aneurysm) ([place])   . Heart attack ([place])   . Pacemaker     medtronic pacer  . History of cervical spinal surgery     over in [place]  [name] HOH (hard of hearing)     BILATERAL HEARING AIDS  . Prostate cancer ([place])   . Basal cell adenocarcinoma     'bottom lip" (11/06/2012)  . Bladder cancer ([place])   . Attention to urostomy [facility])     "since 2006" (11/06/2012)  . Atrial fibrillation ([place])     on amiodarone and has PTVP in place  . Exertional shortness of breath   .  Chronic radicular pain of lower back   . Anxiety   . COPD (chronic obstructive pulmonary disease) ([place])   . Pneumonia     ROS:   All systems reviewed and negative except as noted in the HPI.   Past Surgical History  Procedure Laterality Date  . Insert / replace / remove pacemaker      11/2009  DR BRODIE    . Prostatectomy      BLADDER ALSO REMOVED (OSTOMY BAG)  2006  FOR CANCER     . Abdominal aortic aneurysm repair  1999    STENT PLACED    . Cataract extraction w/ intraocular lens  implant, bilateral    . Appendectomy      1959  . Revision urostomy cutaneous    . Revision urostomy cutaneous  2006    Urostomy bag placed on right lower abdomen  . Vertebroplasty  08/22/2011    Procedure: VERTEBROPLASTY;  Surgeon: Winfield Cunas, MD;  Location: [place] [facility];  Service: Neurosurgery;  Laterality: N/A;  Lumbar Two vertebroplasty  . Cholecystectomy      4 YRS AGO   . Posterior lumbar fusion  11/06/2012  . Coronary artery bypass graft  1999    CABG X2  . Skin cancer excision      "bottom lip" (11/06/2012)  . Bladder surgery      "for cancer" (11/06/2012)  . Lumbar laminectomy/decompression microdiscectomy Bilateral 11/06/2012    Procedure: LUMBAR LAMINECTOMY/DECOMPRESSION MICRODISCECTOMY;  Surgeon: Sinclair Ship, MD;  Location: [place];  Service: Orthopedics;  Laterality: Bilateral;  Lumbar 2-5 decompression     Family History  Problem Relation Age of Onset  . Cancer Father     stomach  . Cancer Sister     lung  . Heart disease Sister   . Hypertension Sister   . Heart disease Mother   . Hypertension Mother   . Heart attack Neg Hx   . Stroke Neg Hx      Social History   Social History  . Marital Status: Married    Spouse Name: N/A  . Number of Children: 3  . Years of Education: N/A   Occupational History  . Retired    Social History Main Topics  . Smoking status: Former Smoker -- 3.00 packs/day for 50 years    Types: Cigarettes    Quit date: 06/04/1978  . Smokeless tobacco: Current User    Types: Snuff  . Alcohol Use: No     Comment: rare "once a month"  . Drug Use: No  . Sexual Activity: No   Other Topics Concern  . Not on file   Social History Narrative     BP 122/78 mmHg  Pulse 93  Ht 5\' 8"  (1.727 m)  Wt 153 lb 12.8 oz (69.763 kg)  BMI 23.39 kg/m2  Physical Exam:  Chronically ill appearing elderly man, NAD HEENT: Unremarkable Neck:  6 cm JVD, no thyromegally Lungs:  Clear with no wheezes, rales, or rhonchi. Well-healed pacemaker incision HEART:  Regular rate rhythm, no murmurs, no rubs, no clicks Abd:  soft, positive bowel sounds, no organomegally, no rebound, no guarding Ext:  2 plus pulses, no edema, no cyanosis, no clubbing, right middle finger in a brace. Skin:  No rashes no nodules Neuro:  CN II through XII intact, motor grossly intact  DEVICE  Normal device function.  See PaceArt for details.    Assess/Plan:

## 2015-05-25 LAB — CUP PACEART INCLINIC DEVICE CHECK
Brady Statistic AP VS Percent: 72 %
Brady Statistic AS VS Percent: 28 %
Implantable Lead Implant Date: 20110614
Implantable Lead Location: 753859
Implantable Lead Location: 753860
Lead Channel Impedance Value: 505 Ohm
Lead Channel Impedance Value: 716 Ohm
Lead Channel Pacing Threshold Pulse Width: 1 ms
Lead Channel Sensing Intrinsic Amplitude: 11.2 mV
Lead Channel Setting Pacing Amplitude: 2 V
Lead Channel Setting Pacing Pulse Width: 1 ms
MDC IDC LEAD IMPLANT DT: 20110614
MDC IDC MSMT BATTERY IMPEDANCE: 178 Ohm
MDC IDC MSMT BATTERY REMAINING LONGEVITY: 127 mo
MDC IDC MSMT BATTERY VOLTAGE: 2.79 V
MDC IDC MSMT LEADCHNL RA PACING THRESHOLD AMPLITUDE: 0.75 V
MDC IDC MSMT LEADCHNL RA PACING THRESHOLD PULSEWIDTH: 0.4 ms
MDC IDC MSMT LEADCHNL RA SENSING INTR AMPL: 4 mV
MDC IDC MSMT LEADCHNL RV PACING THRESHOLD AMPLITUDE: 1.5 V
MDC IDC SESS DTM: 20161209173505
MDC IDC SET LEADCHNL RV PACING AMPLITUDE: 2.5 V
MDC IDC SET LEADCHNL RV SENSING SENSITIVITY: 4 mV
MDC IDC STAT BRADY AP VP PERCENT: 0 %
MDC IDC STAT BRADY AS VP PERCENT: 0 %

## 2015-07-04 ENCOUNTER — Ambulatory Visit: Payer: Commercial Managed Care - HMO | Admitting: Cardiology

## 2015-07-11 ENCOUNTER — Emergency Department (HOSPITAL_COMMUNITY)

## 2015-07-11 ENCOUNTER — Emergency Department (HOSPITAL_COMMUNITY)
Admission: EM | Admit: 2015-07-11 | Discharge: 2015-07-11 | Disposition: A | Discharge status: Home / Self Care | Attending: Emergency Medicine | Admitting: Emergency Medicine

## 2015-07-11 ENCOUNTER — Encounter (HOSPITAL_COMMUNITY): Payer: Self-pay | Admitting: Emergency Medicine

## 2015-07-11 DIAGNOSIS — Z8546 Personal history of malignant neoplasm of prostate: Secondary | ICD-10-CM | POA: Insufficient documentation

## 2015-07-11 DIAGNOSIS — I10 Essential (primary) hypertension: Secondary | ICD-10-CM | POA: Insufficient documentation

## 2015-07-11 DIAGNOSIS — Z7951 Long term (current) use of inhaled steroids: Secondary | ICD-10-CM | POA: Insufficient documentation

## 2015-07-11 DIAGNOSIS — Z8701 Personal history of pneumonia (recurrent): Secondary | ICD-10-CM | POA: Insufficient documentation

## 2015-07-11 DIAGNOSIS — R0603 Acute respiratory distress: Secondary | ICD-10-CM

## 2015-07-11 DIAGNOSIS — Z87891 Personal history of nicotine dependence: Secondary | ICD-10-CM | POA: Insufficient documentation

## 2015-07-11 DIAGNOSIS — J441 Chronic obstructive pulmonary disease with (acute) exacerbation: Secondary | ICD-10-CM | POA: Diagnosis not present

## 2015-07-11 DIAGNOSIS — K219 Gastro-esophageal reflux disease without esophagitis: Secondary | ICD-10-CM | POA: Insufficient documentation

## 2015-07-11 DIAGNOSIS — G8929 Other chronic pain: Secondary | ICD-10-CM | POA: Insufficient documentation

## 2015-07-11 DIAGNOSIS — E785 Hyperlipidemia, unspecified: Secondary | ICD-10-CM | POA: Insufficient documentation

## 2015-07-11 DIAGNOSIS — F419 Anxiety disorder, unspecified: Secondary | ICD-10-CM | POA: Insufficient documentation

## 2015-07-11 DIAGNOSIS — Z7982 Long term (current) use of aspirin: Secondary | ICD-10-CM | POA: Insufficient documentation

## 2015-07-11 DIAGNOSIS — Z8551 Personal history of malignant neoplasm of bladder: Secondary | ICD-10-CM | POA: Diagnosis not present

## 2015-07-11 DIAGNOSIS — R5383 Other fatigue: Secondary | ICD-10-CM | POA: Diagnosis not present

## 2015-07-11 DIAGNOSIS — I252 Old myocardial infarction: Secondary | ICD-10-CM | POA: Insufficient documentation

## 2015-07-11 DIAGNOSIS — I2510 Atherosclerotic heart disease of native coronary artery without angina pectoris: Secondary | ICD-10-CM | POA: Insufficient documentation

## 2015-07-11 DIAGNOSIS — R0602 Shortness of breath: Secondary | ICD-10-CM | POA: Diagnosis present

## 2015-07-11 DIAGNOSIS — Z79899 Other long term (current) drug therapy: Secondary | ICD-10-CM | POA: Diagnosis not present

## 2015-07-11 LAB — CBC WITH DIFFERENTIAL/PLATELET
BASOS PCT: 0 %
Basophils Absolute: 0 10*3/uL (ref 0.0–0.1)
EOS PCT: 0 %
Eosinophils Absolute: 0 10*3/uL (ref 0.0–0.7)
HEMATOCRIT: 41.3 % (ref 39.0–52.0)
Hemoglobin: 13.5 g/dL (ref 13.0–17.0)
LYMPHS ABS: 1.6 10*3/uL (ref 0.7–4.0)
Lymphocytes Relative: 12 %
MCH: 30 pg (ref 26.0–34.0)
MCHC: 32.7 g/dL (ref 30.0–36.0)
MCV: 91.8 fL (ref 78.0–100.0)
MONO ABS: 0.5 10*3/uL (ref 0.1–1.0)
MONOS PCT: 4 %
NEUTROS ABS: 10.9 10*3/uL — AB (ref 1.7–7.7)
Neutrophils Relative %: 84 %
PLATELETS: 208 10*3/uL (ref 150–400)
RBC: 4.5 MIL/uL (ref 4.22–5.81)
RDW: 16.5 % — AB (ref 11.5–15.5)
WBC Morphology: INCREASED
WBC: 13 10*3/uL — AB (ref 4.0–10.5)

## 2015-07-11 LAB — COMPREHENSIVE METABOLIC PANEL
ALT: 28 U/L (ref 17–63)
AST: 23 U/L (ref 15–41)
Albumin: 2.8 g/dL — ABNORMAL LOW (ref 3.5–5.0)
Alkaline Phosphatase: 74 U/L (ref 38–126)
Anion gap: 13 (ref 5–15)
BUN: 35 mg/dL — ABNORMAL HIGH (ref 6–20)
CO2: 28 mmol/L (ref 22–32)
Calcium: 9.2 mg/dL (ref 8.9–10.3)
Chloride: 101 mmol/L (ref 101–111)
Creatinine, Ser: 1.35 mg/dL — ABNORMAL HIGH (ref 0.61–1.24)
GFR calc Af Amer: 54 mL/min — ABNORMAL LOW (ref >60)
GFR calc non Af Amer: 46 mL/min — ABNORMAL LOW (ref >60)
Glucose, Bld: 126 mg/dL — ABNORMAL HIGH (ref 65–99)
Potassium: 4.8 mmol/L (ref 3.5–5.1)
Sodium: 142 mmol/L (ref 135–145)
Total Bilirubin: 1 mg/dL (ref 0.3–1.2)
Total Protein: 6.3 g/dL — ABNORMAL LOW (ref 6.5–8.1)

## 2015-07-11 LAB — I-STAT TROPONIN, ED: Troponin i, poc: 0.04 ng/mL (ref 0.00–0.08)

## 2015-07-11 LAB — BRAIN NATRIURETIC PEPTIDE: B NATRIURETIC PEPTIDE 5: 163.2 pg/mL — AB (ref 0.0–100.0)

## 2015-07-11 LAB — TROPONIN I: Troponin I: 0.05 ng/mL — ABNORMAL HIGH (ref <0.031)

## 2015-07-11 MED ORDER — METHYLPREDNISOLONE SODIUM SUCC 125 MG IJ SOLR
125.0000 mg | Freq: Once | INTRAMUSCULAR | Status: AC
  Administered 2015-07-11: 125 mg via INTRAVENOUS
  Filled 2015-07-11: qty 2

## 2015-07-11 MED ORDER — AZITHROMYCIN 250 MG PO TABS: 500.0000 mg | ORAL_TABLET | Freq: Every day | ORAL | Status: AC

## 2015-07-11 MED ORDER — AZITHROMYCIN 250 MG PO TABS: 500.0000 mg | ORAL_TABLET | Freq: Every day | ORAL | Status: DC

## 2015-07-11 MED ORDER — DEXTROSE 5 % IV SOLN
1.0000 g | Freq: Once | INTRAVENOUS | Status: AC
  Administered 2015-07-11: 1 g via INTRAVENOUS
  Filled 2015-07-11: qty 10

## 2015-07-11 MED ORDER — SODIUM CHLORIDE 0.9 % IV SOLN
INTRAVENOUS | Status: DC
  Administered 2015-07-11: 08:00:00 via INTRAVENOUS

## 2015-07-11 MED ORDER — DEXTROSE 5 % IV SOLN
500.0000 mg | Freq: Once | INTRAVENOUS | Status: AC
  Administered 2015-07-11: 500 mg via INTRAVENOUS
  Filled 2015-07-11: qty 500

## 2015-07-11 NOTE — ED Notes (Signed)
Hospice representative at bedside speaking with pts family. RN spoke with hospice rep as well and MD.

## 2015-07-11 NOTE — ED Provider Notes (Addendum)
CSN: [id number]     Arrival date & time 07/11/15  0708 History   First MD Initiated Contact with Patient 07/11/15 [phone]     Chief Complaint  Patient presents with  . Shortness of Breath  . Fatigue     (Consider location/radiation/quality/duration/timing/severity/associated sxs/prior Treatment) HPI patient presents with his wife assists with history of present illness, as the patient has substantial dyspnea Over the past few days, possibly weeks the patient has become increasingly weak, with increased work of breathing, dyspnea. No report of fever, confusion. There is ongoing cough, anorexia, nausea, no vomiting. Patient acknowledges multiple medical issues, including chronic oxygen dependency, nasal cannula, 24/7. Since onset no clear alleviating or exacerbating factors. No new medication, diet, activity.  Past Medical History  Diagnosis Date  . Hyperlipidemia   . Orthostatic hypotension   . Bradycardia   . Syncope     s/p PTVP  . Myocardial infarction ([place])     1981  . CAD (coronary artery disease)     prior CABG in 2002 negative Myoview in 2009  . Hypertension   . Stroke [facility])     TIA  2007    . GERD (gastroesophageal reflux disease)   . Headache(784.0)   . Arthritis     OSTEOPOROSIS  . Aortic stenosis, mild     by 01/08/08 echo  . Chronic anticoagulation   . Depression   . AAA (abdominal aortic aneurysm) ([place])   . Heart attack ([place])   . Pacemaker     medtronic pacer  . History of cervical spinal surgery     [place]  [name] HOH (hard of hearing)     BILATERAL HEARING AIDS  . Prostate cancer ([place])   . Basal cell adenocarcinoma     'bottom lip" (11/06/2012)  . Bladder cancer ([place])   . Attention to urostomy [facility])     "since 2006" (11/06/2012)  . Atrial fibrillation ([place])     on amiodarone and has PTVP in place  . Exertional shortness of breath   . Chronic radicular pain of lower back   . Anxiety   . COPD (chronic obstructive pulmonary disease) ([place])   .  Pneumonia    Past Surgical History  Procedure Laterality Date  . Insert / replace / remove pacemaker      11/2009  DR BRODIE    . Prostatectomy      BLADDER ALSO REMOVED (OSTOMY BAG)  2006  FOR CANCER     . Abdominal aortic aneurysm repair  1999    STENT PLACED    . Cataract extraction w/ intraocular lens  implant, bilateral    . Appendectomy      1959  . Revision urostomy cutaneous    . Revision urostomy cutaneous  2006    Urostomy bag placed on right lower abdomen  . Vertebroplasty  08/22/2011    Procedure: VERTEBROPLASTY;  Surgeon: Winfield Cunas, MD;  Location: [facility];  Service: Neurosurgery;  Laterality: N/A;  Lumbar Two vertebroplasty  . Cholecystectomy      4 YRS AGO  . Posterior lumbar fusion  11/06/2012  . Coronary artery bypass graft  1999    CABG X2  . Skin cancer excision      "bottom lip" (11/06/2012)  . Bladder surgery      "for cancer" (11/06/2012)  . Lumbar laminectomy/decompression microdiscectomy Bilateral 11/06/2012    Procedure: LUMBAR LAMINECTOMY/DECOMPRESSION MICRODISCECTOMY;  Surgeon: Sinclair Ship, MD;  Location: [facility];  Service: Orthopedics;  Laterality: Bilateral;  Lumbar 2-5 decompression   Family History  Problem Relation Age of Onset  . Cancer Father     stomach  . Cancer Sister     lung  . Heart disease Sister   . Hypertension Sister   . Heart disease Mother   . Hypertension Mother   . Heart attack Neg Hx   . Stroke Neg Hx    Social History  Substance Use Topics  . Smoking status: Former Smoker -- 3.00 packs/day for 50 years    Types: Cigarettes    Quit date: 06/04/1978  . Smokeless tobacco: Current User    Types: Snuff  . Alcohol Use: No     Comment: rare "once a month"    Review of Systems  Constitutional: Positive for fatigue.  HENT:       Per HPI, otherwise negative  Respiratory:       Per HPI, otherwise negative  Cardiovascular:       Per HPI, otherwise negative  Gastrointestinal: Negative for vomiting.  Endocrine:        Negative aside from HPI  Genitourinary:       Neg aside from HPI   Musculoskeletal:       Per HPI, otherwise negative  Skin: Negative.   Allergic/Immunologic: Positive for immunocompromised state.  Neurological: Positive for weakness.      Allergies  Codeine  Home Medications   Prior to Admission medications   Medication Sig Start Date End Date Taking? Authorizing Provider  aspirin 81 MG tablet Take 81 mg by mouth daily.    Historical Provider, MD  budesonide-formoterol (SYMBICORT) 160-4.5 MCG/ACT inhaler Inhale 2 puffs into the lungs 2 (two) times daily. 12/27/14   Tammy S Parrett, NP  dexamethasone (DECADRON) 4 MG tablet Take 4 mg by mouth 2 (two) times daily with a meal.    Historical Provider, MD  fentaNYL (DURAGESIC - DOSED MCG/HR) 50 MCG/HR Place 1 patch onto the skin every 3 (three) days. 03/14/15   Historical Provider, MD  furosemide (LASIX) 20 MG tablet Take 1 tablet (20 mg total) by mouth daily as needed (increased shortness of breath). 12/17/14   Darlin Coco, MD  ipratropium-albuterol (DUONEB) 0.5-2.5 (3) MG/3ML SOLN Take 3 mLs by nebulization every 4 (four) hours as needed. 08/24/14   Theodis Blaze, MD  LORazepam (ATIVAN) 0.5 MG tablet Take 1 tablet by mouth every 4 (four) hours as needed. Anxiety 03/14/15   Historical Provider, MD  methocarbamol (ROBAXIN) 500 MG tablet Take 1 tablet (500 mg total) by mouth 3 (three) times daily. 03/02/15   Geradine Girt, DO  metoprolol succinate (TOPROL-XL) 25 MG 24 hr tablet Take 1 tablet (25 mg total) by mouth daily. 03/02/15   Geradine Girt, DO  midodrine (PROAMATINE) 5 MG tablet Take 1 tablet (5 mg total) by mouth 3 (three) times daily with meals. 03/02/15   Geradine Girt, DO  omeprazole (PRILOSEC) 20 MG capsule Take 20 mg by mouth daily.    Historical Provider, MD  oxyCODONE (ROXICODONE) 15 MG immediate release tablet Take 1 tablet by mouth every 4 (four) hours as needed. Pain 04/19/15   Historical Provider, MD  PRESCRIPTION  MEDICATION Wears 2 L of O2 continous    Historical Provider, MD  zolpidem (AMBIEN) 5 MG tablet Take 5 mg by mouth at bedtime. 02/03/15   Historical Provider, MD   BP 104/71 mmHg  Pulse 124  Temp(Src) 99 F (37.2 C) (Oral)  Resp 21  SpO2 95% Physical  Exam  Constitutional: He appears well-developed. No distress.  Ill appearing elderly M  HENT:  Head: Normocephalic and atraumatic.  Eyes: Conjunctivae and EOM are normal.  Cardiovascular: Regular rhythm.  Tachycardia present.   Pulmonary/Chest: No stridor. Tachypnea noted. He is in respiratory distress. He has decreased breath sounds.  Abdominal: He exhibits no distension.  Musculoskeletal: He exhibits no edema.  Neurological: He displays atrophy.  Skin: Skin is warm and dry.  Psychiatric: He has a normal mood and affect. Cognition and memory are impaired.  Nursing note and vitals reviewed.   ED Course  Procedures (including critical care time) Labs Review Labs Reviewed  CBC WITH DIFFERENTIAL/PLATELET - Abnormal; Notable for the following:    WBC 13.0 (*)    RDW 16.5 (*)    Neutro Abs 10.9 (*)    All other components within normal limits  BRAIN NATRIURETIC PEPTIDE - Abnormal; Notable for the following:    B Natriuretic Peptide 163.2 (*)    All other components within normal limits  TROPONIN I - Abnormal; Notable for the following:    Troponin I 0.05 (*)    All other components within normal limits  COMPREHENSIVE METABOLIC PANEL - Abnormal; Notable for the following:    Glucose, Bld 126 (*)    BUN 35 (*)    Creatinine, Ser 1.35 (*)    Total Protein 6.3 (*)    Albumin 2.8 (*)    GFR calc non Af Amer 46 (*)    GFR calc Af Amer 54 (*)    All other components within normal limits  I-STAT TROPOININ, ED    Imaging Review Dg Chest 2 View  07/11/2015  CLINICAL DATA:  Shortness of breath, weakness. History of COPD, MI, AFib, GERD EXAM: CHEST  2 VIEW COMPARISON:  Chest x-rays dated 03/24/2015 and 02/24/2015. Comparison is also made  to earlier chest x-ray of 12/17/2014 and chest CT of 01/07/2015. FINDINGS: Coarse interstitial opacities are again seen bilaterally, most prominent at the left lung base and right upper lobe, compatible with the pulmonary fibrosis demonstrated on chest CT of 01/07/2015. Slightly increased opacity within the right upper lobe suggests superimposed edema, with associated mild central pulmonary vascular congestion. No pleural effusion. Heart size is upper normal, stable. Left chest wall pacemaker/AICD stable in position. No pneumothorax. No pleural effusions seen. No acute osseous abnormality. IMPRESSION: 1. Coarse interstitial opacities bilaterally, again most dense at the left lung base and within the right upper lobe, compatible with the pulmonary fibrosis best demonstrated on chest CT of 01/07/2015. 2. Compared to the most recent chest x-ray, there is slightly increased opacity within the right upper lobe suggesting a mild superimposed edema, with associated mild central pulmonary vascular congestion, presumed mild volume overload/CHF. Electronically Signed   By: Franki Cabot M.D.   On: 07/11/2015 08:03   I have personally reviewed and evaluated these images and lab results as part of my medical decision-making.   EKG Interpretation   Date/Time:  Monday July 11 2015 07:10:36 EST Ventricular Rate:  122 PR Interval:  172 QRS Duration: 101 QT Interval:  327 QTC Calculation: 466 R Axis:   154 Text Interpretation:  Sinus tachycardia Multiform ventricular premature  complexes Left atrial enlargement Right axis deviation Probable  anteroseptal infarct, old Borderline T abnormalities, inferior leads  Baseline wander in lead(s) II III aVF Sinus tachycardia Premature  ventricular complexes T wave abnormality Abnormal ekg Confirmed by  Carmin Muskrat  MD ([id number]) on 07/11/2015 7:48:00 AM     Pulse  oximetry 95% with 3 L nasal cannula abnormal Cardiac 121 sinus tach abnormal  With increased work of  breathing, hypoxia, concern for pneumonia, patient started on antibiotics.  9:17 AM Patient's daughter is now here. She states that the patient is enrolled in hospice care. The patient's wife has memory loss, did not offer this information previously. Patient's daughter states the patient has ongoing hospice evaluation. She states that the patient is DO NOT RESUSCITATE, but illness such as pneumonia should be treated. We discussed attempting to have the patient return to inpatient hospice. Subsequently I discussed his case with our case management team to help facilitate this.   1:47 PM After multiple conversations with the hospitalist team, the patient has been admitted to residential hospice services. On repeat exam, the patient has diminished tachycardia, remains in similar condition, minimally interactive.  MDM   Final diagnoses:  Respiratory distress   Elderly male presents in respiratory distress. Notably, the patient has pulmonary hypertension, fibrosis. Here, the patient is minimally interactive, tachypneic, tachycardic, hypoxic. Given the patient's history, some suspicion of pneumonia, patient was started on antibiotics. Eventually, the patient's family members added history, the patient is enrolled in hospice care. Subsequently I discussed his case with our hospice team, and the patient was transferred to inpatient hospice.  Carmin Muskrat, MD 07/11/15 [place], MD 08/29/15 [phone]

## 2015-07-11 NOTE — ED Notes (Signed)
Emptied pt's ostomy bag

## 2015-07-11 NOTE — Discharge Planning (Signed)
ERCM consulted to assist with pt return to [facility].  Pt had been active with [facility] in [place]; [place] contacted Diane (liason) to set up visit for pt return to their facility.  Diane states someone will be on campus within the hour.

## 2015-07-11 NOTE — ED Notes (Signed)
Pt presents to ER with [facility] from [facility] for increased SOB, feeling "warm to the touch", lethargy and productive cough; pt reports feeling like this a couple days;

## 2015-07-11 NOTE — ED Notes (Signed)
[facility] at bedside to transport pt to [facility]. This RN called report to facility. Pt's family aware and will go to facility. Pt's paperwork and DNR form given to [facility]

## 2015-07-13 ENCOUNTER — Ambulatory Visit: Payer: Commercial Managed Care - HMO | Admitting: Internal Medicine

## 2015-08-03 DEATH — deceased

## 2015-09-07 ENCOUNTER — Telehealth: Payer: Self-pay | Admitting: Internal Medicine

## 2015-09-07 NOTE — Telephone Encounter (Signed)
pls get condolence card for [employer]

## 2015-09-08 NOTE — Telephone Encounter (Signed)
Condolence card has been placed in MR's look-ats to be signed. Nothing further needed at this time.
